# Patient Record
Sex: Female | Born: 1950
Health system: Southern US, Community
[De-identification: ages and names within clinical notes are randomized; demographics above are authoritative.]

## PROBLEM LIST (undated history)

## (undated) DIAGNOSIS — F32A Depression, unspecified: Secondary | ICD-10-CM

## (undated) DIAGNOSIS — Z780 Asymptomatic menopausal state: Secondary | ICD-10-CM

## (undated) DIAGNOSIS — C50919 Malignant neoplasm of unspecified site of unspecified female breast: Secondary | ICD-10-CM

## (undated) DIAGNOSIS — T7840XA Allergy, unspecified, initial encounter: Secondary | ICD-10-CM

## (undated) DIAGNOSIS — R0600 Dyspnea, unspecified: Secondary | ICD-10-CM

## (undated) DIAGNOSIS — I10 Essential (primary) hypertension: Secondary | ICD-10-CM

## (undated) DIAGNOSIS — E079 Disorder of thyroid, unspecified: Secondary | ICD-10-CM

## (undated) DIAGNOSIS — F419 Anxiety disorder, unspecified: Secondary | ICD-10-CM

## (undated) DIAGNOSIS — E039 Hypothyroidism, unspecified: Secondary | ICD-10-CM

## (undated) DIAGNOSIS — F329 Major depressive disorder, single episode, unspecified: Secondary | ICD-10-CM

## (undated) HISTORY — DX: Asymptomatic menopausal state: Z78.0

## (undated) HISTORY — DX: Depression, unspecified: F32.A

## (undated) HISTORY — DX: Anxiety disorder, unspecified: F41.9

## (undated) HISTORY — PX: ADENOIDECTOMY: SUR15

## (undated) HISTORY — DX: Major depressive disorder, single episode, unspecified: F32.9

## (undated) HISTORY — PX: TONSILLECTOMY: SUR1361

## (undated) HISTORY — DX: Allergy, unspecified, initial encounter: T78.40XA

## (undated) HISTORY — DX: Malignant neoplasm of unspecified site of unspecified female breast: C50.919

## (undated) HISTORY — PX: APPENDECTOMY: SHX54

---

## 2001-09-07 ENCOUNTER — Other Ambulatory Visit: Admission: RE | Admit: 2001-09-07 | Discharge: 2001-09-07 | Payer: Self-pay | Admitting: Obstetrics and Gynecology

## 2006-05-07 ENCOUNTER — Ambulatory Visit: Payer: Self-pay | Admitting: Family Medicine

## 2007-06-25 DIAGNOSIS — C50919 Malignant neoplasm of unspecified site of unspecified female breast: Secondary | ICD-10-CM

## 2007-06-25 HISTORY — DX: Malignant neoplasm of unspecified site of unspecified female breast: C50.919

## 2007-06-25 HISTORY — PX: MASTECTOMY: SHX3

## 2007-07-22 ENCOUNTER — Encounter: Payer: Self-pay | Admitting: Obstetrics & Gynecology

## 2007-07-22 ENCOUNTER — Ambulatory Visit: Payer: Self-pay | Admitting: Obstetrics & Gynecology

## 2007-07-24 ENCOUNTER — Encounter (INDEPENDENT_AMBULATORY_CARE_PROVIDER_SITE_OTHER): Payer: Self-pay | Admitting: Diagnostic Radiology

## 2007-07-24 ENCOUNTER — Encounter: Admission: RE | Admit: 2007-07-24 | Discharge: 2007-07-24 | Payer: Self-pay | Admitting: Obstetrics & Gynecology

## 2007-08-03 ENCOUNTER — Encounter: Admission: RE | Admit: 2007-08-03 | Discharge: 2007-08-03 | Payer: Self-pay | Admitting: Obstetrics & Gynecology

## 2007-08-25 ENCOUNTER — Encounter: Admission: RE | Admit: 2007-08-25 | Discharge: 2007-08-25 | Payer: Self-pay | Admitting: Surgery

## 2007-08-27 ENCOUNTER — Encounter (INDEPENDENT_AMBULATORY_CARE_PROVIDER_SITE_OTHER): Payer: Self-pay | Admitting: Surgery

## 2007-08-27 ENCOUNTER — Ambulatory Visit (HOSPITAL_BASED_OUTPATIENT_CLINIC_OR_DEPARTMENT_OTHER): Admission: RE | Admit: 2007-08-27 | Discharge: 2007-08-28 | Payer: Self-pay | Admitting: Surgery

## 2007-09-10 ENCOUNTER — Ambulatory Visit: Payer: Self-pay | Admitting: Oncology

## 2007-10-09 LAB — COMPREHENSIVE METABOLIC PANEL
Albumin: 4 g/dL (ref 3.5–5.2)
CO2: 23 mEq/L (ref 19–32)
Calcium: 9.2 mg/dL (ref 8.4–10.5)
Chloride: 106 mEq/L (ref 96–112)
Glucose, Bld: 119 mg/dL — ABNORMAL HIGH (ref 70–99)
Sodium: 142 mEq/L (ref 135–145)
Total Bilirubin: 0.4 mg/dL (ref 0.3–1.2)
Total Protein: 6.6 g/dL (ref 6.0–8.3)

## 2007-10-09 LAB — CBC WITH DIFFERENTIAL/PLATELET
Eosinophils Absolute: 0.2 10*3/uL (ref 0.0–0.5)
HCT: 40.1 % (ref 34.8–46.6)
LYMPH%: 36.3 % (ref 14.0–48.0)
MONO#: 0.6 10*3/uL (ref 0.1–0.9)
NEUT#: 5.5 10*3/uL (ref 1.5–6.5)
Platelets: 231 10*3/uL (ref 145–400)
RBC: 4.64 10*6/uL (ref 3.70–5.32)
WBC: 9.9 10*3/uL (ref 3.9–10.0)

## 2007-10-09 LAB — LACTATE DEHYDROGENASE: LDH: 161 U/L (ref 94–250)

## 2007-10-09 LAB — CANCER ANTIGEN 27.29: CA 27.29: 32 U/mL (ref 0–39)

## 2007-10-12 ENCOUNTER — Encounter: Admission: RE | Admit: 2007-10-12 | Discharge: 2007-10-12 | Payer: Self-pay | Admitting: Oncology

## 2007-10-20 ENCOUNTER — Ambulatory Visit (HOSPITAL_COMMUNITY): Admission: RE | Admit: 2007-10-20 | Discharge: 2007-10-20 | Payer: Self-pay | Admitting: Oncology

## 2007-11-27 ENCOUNTER — Ambulatory Visit (HOSPITAL_BASED_OUTPATIENT_CLINIC_OR_DEPARTMENT_OTHER): Admission: RE | Admit: 2007-11-27 | Discharge: 2007-11-27 | Payer: Self-pay | Admitting: Surgery

## 2007-11-27 ENCOUNTER — Encounter (INDEPENDENT_AMBULATORY_CARE_PROVIDER_SITE_OTHER): Payer: Self-pay | Admitting: Surgery

## 2007-12-07 ENCOUNTER — Ambulatory Visit: Payer: Self-pay | Admitting: Oncology

## 2007-12-09 LAB — CBC WITH DIFFERENTIAL/PLATELET
BASO%: 0.8 % (ref 0.0–2.0)
EOS%: 2.6 % (ref 0.0–7.0)
HCT: 40.1 % (ref 34.8–46.6)
LYMPH%: 38 % (ref 14.0–48.0)
MCH: 30.5 pg (ref 26.0–34.0)
MCHC: 35 g/dL (ref 32.0–36.0)
MONO#: 0.7 10*3/uL (ref 0.1–0.9)
MONO%: 7.5 % (ref 0.0–13.0)
NEUT%: 51.1 % (ref 39.6–76.8)
Platelets: 251 10*3/uL (ref 145–400)
RBC: 4.6 10*6/uL (ref 3.70–5.32)
WBC: 9.5 10*3/uL (ref 3.9–10.0)

## 2007-12-10 LAB — COMPREHENSIVE METABOLIC PANEL
ALT: 23 U/L (ref 0–35)
AST: 22 U/L (ref 0–37)
Alkaline Phosphatase: 129 U/L — ABNORMAL HIGH (ref 39–117)
CO2: 24 mEq/L (ref 19–32)
Creatinine, Ser: 0.64 mg/dL (ref 0.40–1.20)
Sodium: 140 mEq/L (ref 135–145)
Total Bilirubin: 0.5 mg/dL (ref 0.3–1.2)
Total Protein: 6.8 g/dL (ref 6.0–8.3)

## 2008-03-08 ENCOUNTER — Ambulatory Visit: Payer: Self-pay | Admitting: Oncology

## 2008-03-10 LAB — CBC WITH DIFFERENTIAL/PLATELET
BASO%: 0.5 % (ref 0.0–2.0)
Eosinophils Absolute: 0.2 10*3/uL (ref 0.0–0.5)
MCHC: 34.8 g/dL (ref 32.0–36.0)
MONO#: 0.5 10*3/uL (ref 0.1–0.9)
MONO%: 5.4 % (ref 0.0–13.0)
NEUT#: 4.7 10*3/uL (ref 1.5–6.5)
RBC: 4.53 10*6/uL (ref 3.70–5.32)
RDW: 12.6 % (ref 11.3–14.5)
WBC: 9.4 10*3/uL (ref 3.9–10.0)

## 2008-03-11 LAB — COMPREHENSIVE METABOLIC PANEL
ALT: 21 U/L (ref 0–35)
Albumin: 4 g/dL (ref 3.5–5.2)
Alkaline Phosphatase: 85 U/L (ref 39–117)
CO2: 25 mEq/L (ref 19–32)
Glucose, Bld: 103 mg/dL — ABNORMAL HIGH (ref 70–99)
Potassium: 3.5 mEq/L (ref 3.5–5.3)
Sodium: 142 mEq/L (ref 135–145)
Total Protein: 6.3 g/dL (ref 6.0–8.3)

## 2008-06-13 IMAGING — CR DG CHEST 2V
2 series · 2 of 2 positions shown · non-contrast
Comparison: None.

CLINICAL DATA: Preop respiratory exam for right breast cancer.
 CHEST - 2 VIEWS:

[view not recorded (1 of 2)]
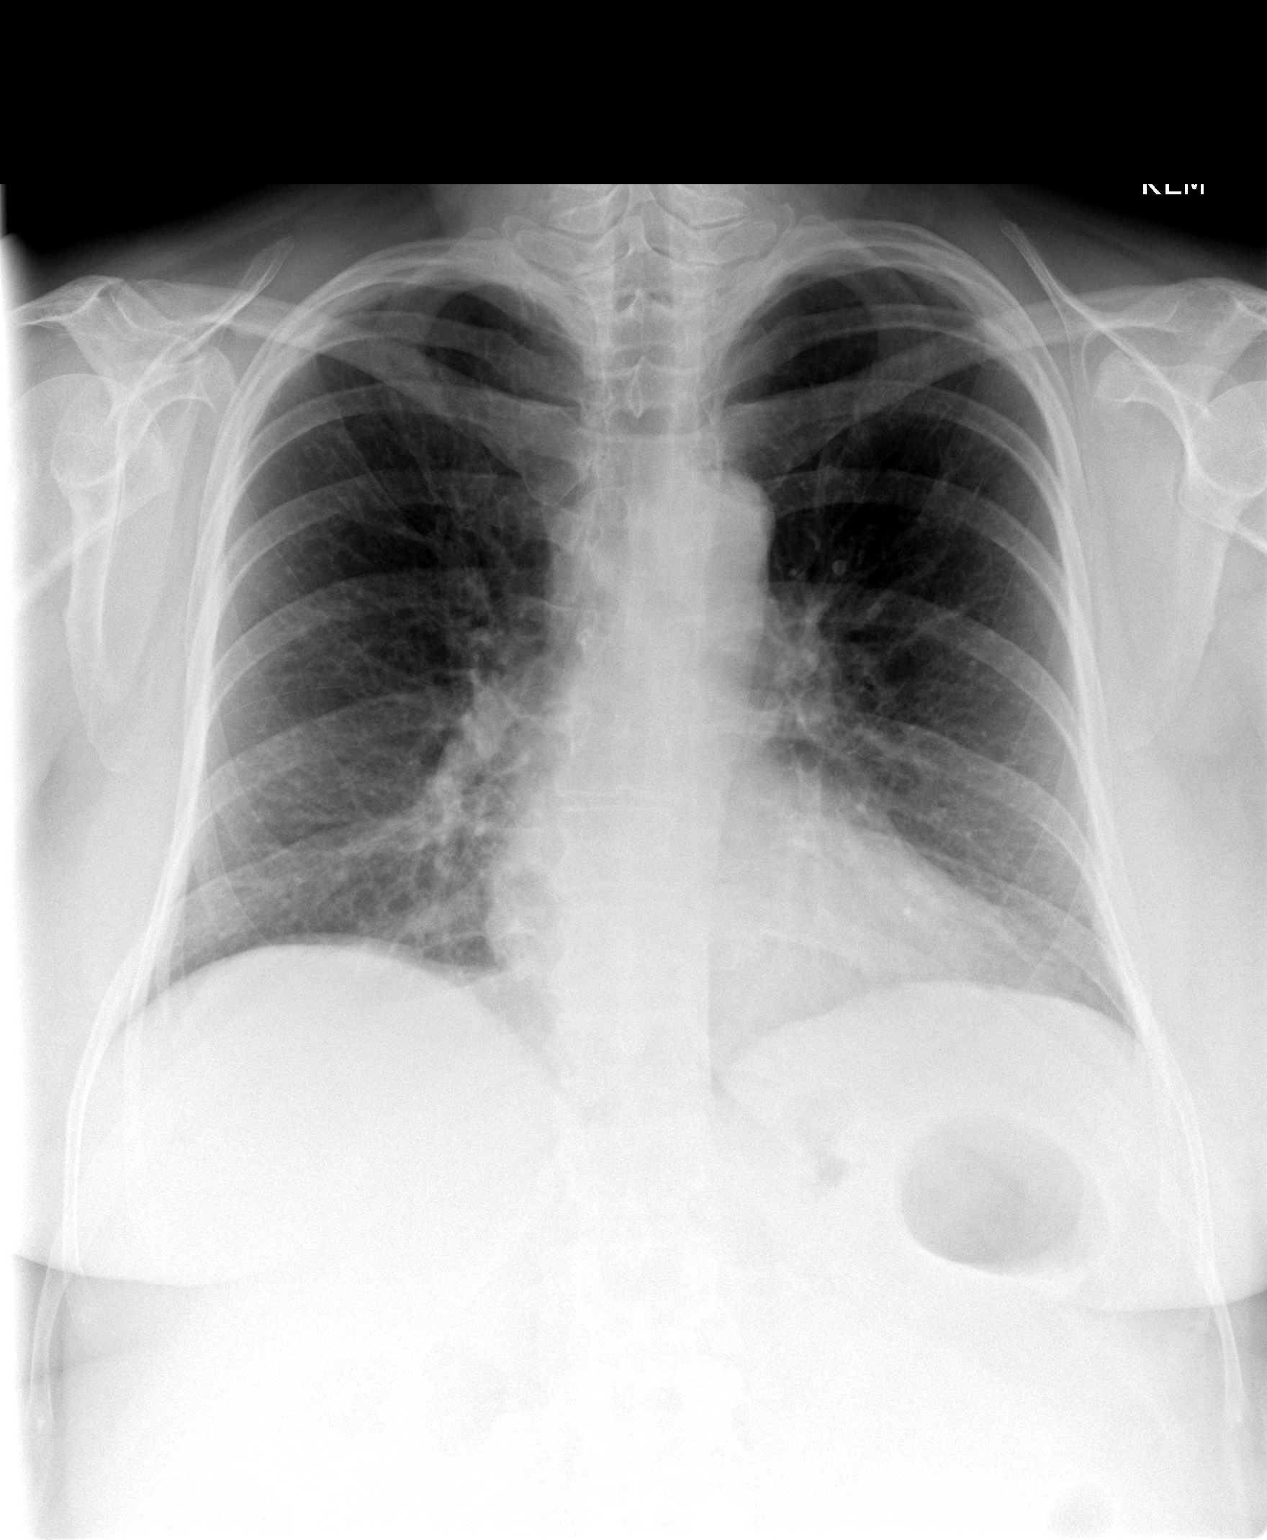

[view not recorded (2 of 2)]
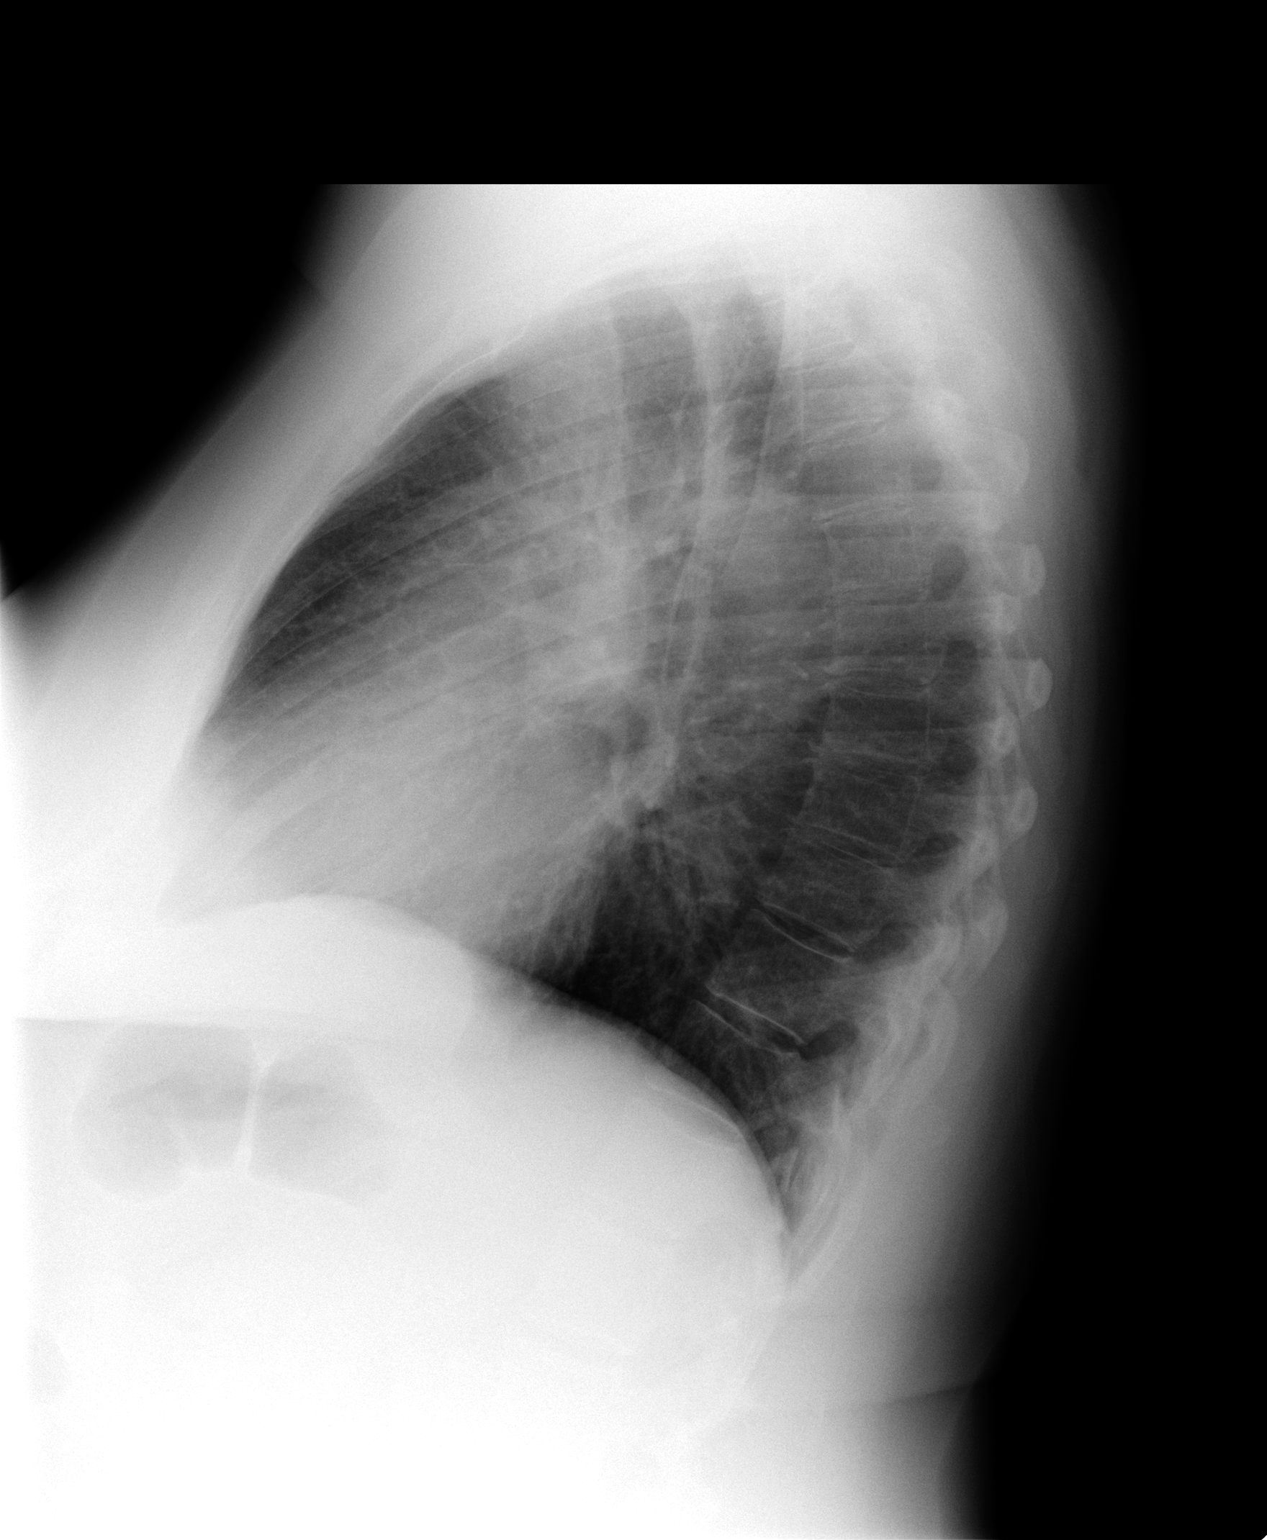

[2 of 2 positions shown; findings below may reference images not displayed]

FINDINGS: Heart size normal.  There is peribronchial thickening without pneumonia or pleural fluid.  No metastases noted to the lung or bone.
IMPRESSION: Bronchitic changes ? no active disease.

## 2008-06-23 ENCOUNTER — Ambulatory Visit: Payer: Self-pay | Admitting: Oncology

## 2008-06-24 HISTORY — PX: BREAST RECONSTRUCTION: SHX9

## 2008-06-28 LAB — CBC WITH DIFFERENTIAL/PLATELET
BASO%: 0.4 % (ref 0.0–2.0)
EOS%: 1.5 % (ref 0.0–7.0)
Eosinophils Absolute: 0.1 10*3/uL (ref 0.0–0.5)
LYMPH%: 42.1 % (ref 14.0–48.0)
MCH: 30.4 pg (ref 26.0–34.0)
MCHC: 34.3 g/dL (ref 32.0–36.0)
MCV: 88.8 fL (ref 81.0–101.0)
MONO%: 5.5 % (ref 0.0–13.0)
Platelets: 202 10*3/uL (ref 145–400)
RBC: 4.61 10*6/uL (ref 3.70–5.32)

## 2008-06-29 LAB — COMPREHENSIVE METABOLIC PANEL
Alkaline Phosphatase: 83 U/L (ref 39–117)
Glucose, Bld: 113 mg/dL — ABNORMAL HIGH (ref 70–99)
Sodium: 143 mEq/L (ref 135–145)
Total Bilirubin: 0.5 mg/dL (ref 0.3–1.2)
Total Protein: 6.5 g/dL (ref 6.0–8.3)

## 2008-10-21 ENCOUNTER — Ambulatory Visit: Payer: Self-pay | Admitting: Oncology

## 2008-12-21 ENCOUNTER — Encounter: Admission: RE | Admit: 2008-12-21 | Discharge: 2008-12-21 | Payer: Self-pay | Admitting: Surgery

## 2009-02-20 ENCOUNTER — Ambulatory Visit: Payer: Self-pay | Admitting: Oncology

## 2009-02-23 LAB — CBC WITH DIFFERENTIAL/PLATELET
BASO%: 0.3 % (ref 0.0–2.0)
Eosinophils Absolute: 0.2 10*3/uL (ref 0.0–0.5)
HCT: 40.2 % (ref 34.8–46.6)
MCHC: 34.9 g/dL (ref 31.5–36.0)
MONO#: 0.4 10*3/uL (ref 0.1–0.9)
NEUT#: 4.6 10*3/uL (ref 1.5–6.5)
NEUT%: 50.8 % (ref 38.4–76.8)
Platelets: 181 10*3/uL (ref 145–400)
WBC: 9.1 10*3/uL (ref 3.9–10.3)
lymph#: 3.8 10*3/uL — ABNORMAL HIGH (ref 0.9–3.3)

## 2009-02-23 LAB — COMPREHENSIVE METABOLIC PANEL
ALT: 27 U/L (ref 0–35)
AST: 34 U/L (ref 0–37)
Albumin: 3.6 g/dL (ref 3.5–5.2)
CO2: 26 mEq/L (ref 19–32)
Calcium: 8.7 mg/dL (ref 8.4–10.5)
Chloride: 109 mEq/L (ref 96–112)
Potassium: 3.2 mEq/L — ABNORMAL LOW (ref 3.5–5.3)
Total Protein: 6.5 g/dL (ref 6.0–8.3)

## 2009-03-29 ENCOUNTER — Ambulatory Visit: Payer: Self-pay | Admitting: Family Medicine

## 2009-04-03 ENCOUNTER — Ambulatory Visit: Payer: Self-pay | Admitting: Oncology

## 2009-04-05 LAB — COMPREHENSIVE METABOLIC PANEL
ALT: 22 U/L (ref 0–35)
BUN: 9 mg/dL (ref 6–23)
CO2: 22 mEq/L (ref 19–32)
Calcium: 8.8 mg/dL (ref 8.4–10.5)
Chloride: 108 mEq/L (ref 96–112)
Creatinine, Ser: 0.84 mg/dL (ref 0.40–1.20)
Glucose, Bld: 126 mg/dL — ABNORMAL HIGH (ref 70–99)
Total Bilirubin: 0.4 mg/dL (ref 0.3–1.2)

## 2009-04-27 ENCOUNTER — Emergency Department (HOSPITAL_BASED_OUTPATIENT_CLINIC_OR_DEPARTMENT_OTHER): Admission: EM | Admit: 2009-04-27 | Discharge: 2009-04-27 | Payer: Self-pay | Admitting: Emergency Medicine

## 2009-04-27 ENCOUNTER — Ambulatory Visit: Payer: Self-pay | Admitting: Diagnostic Radiology

## 2009-05-08 ENCOUNTER — Ambulatory Visit: Payer: Self-pay | Admitting: Oncology

## 2009-05-10 ENCOUNTER — Ambulatory Visit (HOSPITAL_COMMUNITY): Admission: RE | Admit: 2009-05-10 | Discharge: 2009-05-10 | Payer: Self-pay | Admitting: Oncology

## 2009-05-10 LAB — CBC WITH DIFFERENTIAL/PLATELET
Basophils Absolute: 0.1 10*3/uL (ref 0.0–0.1)
EOS%: 1.9 % (ref 0.0–7.0)
Eosinophils Absolute: 0.2 10*3/uL (ref 0.0–0.5)
HCT: 41.2 % (ref 34.8–46.6)
HGB: 14.1 g/dL (ref 11.6–15.9)
MCH: 31.1 pg (ref 25.1–34.0)
MONO#: 0.7 10*3/uL (ref 0.1–0.9)
NEUT#: 6.3 10*3/uL (ref 1.5–6.5)
NEUT%: 56 % (ref 38.4–76.8)
RDW: 13 % (ref 11.2–14.5)
WBC: 11.3 10*3/uL — ABNORMAL HIGH (ref 3.9–10.3)
lymph#: 4 10*3/uL — ABNORMAL HIGH (ref 0.9–3.3)

## 2009-05-10 LAB — COMPREHENSIVE METABOLIC PANEL
AST: 26 U/L (ref 0–37)
Albumin: 3.6 g/dL (ref 3.5–5.2)
BUN: 9 mg/dL (ref 6–23)
CO2: 27 mEq/L (ref 19–32)
Calcium: 8.8 mg/dL (ref 8.4–10.5)
Chloride: 103 mEq/L (ref 96–112)
Creatinine, Ser: 0.68 mg/dL (ref 0.40–1.20)
Potassium: 4.1 mEq/L (ref 3.5–5.3)

## 2009-05-10 LAB — VITAMIN D 25 HYDROXY (VIT D DEFICIENCY, FRACTURES): Vit D, 25-Hydroxy: 23 ng/mL — ABNORMAL LOW (ref 30–89)

## 2009-05-24 LAB — URINALYSIS, MICROSCOPIC - CHCC
Bilirubin (Urine): NEGATIVE
Ketones: NEGATIVE mg/dL
Protein: NEGATIVE mg/dL
Specific Gravity, Urine: 1.01 (ref 1.003–1.035)
pH: 7 (ref 4.6–8.0)

## 2009-05-25 LAB — URINE CULTURE

## 2009-12-27 ENCOUNTER — Encounter: Admission: RE | Admit: 2009-12-27 | Discharge: 2009-12-27 | Payer: Self-pay | Admitting: Oncology

## 2010-01-08 ENCOUNTER — Ambulatory Visit: Payer: Self-pay | Admitting: Oncology

## 2010-01-10 LAB — COMPREHENSIVE METABOLIC PANEL
AST: 20 U/L (ref 0–37)
Albumin: 4.1 g/dL (ref 3.5–5.2)
Alkaline Phosphatase: 67 U/L (ref 39–117)
Glucose, Bld: 103 mg/dL — ABNORMAL HIGH (ref 70–99)
Potassium: 3.7 mEq/L (ref 3.5–5.3)
Sodium: 142 mEq/L (ref 135–145)
Total Bilirubin: 0.5 mg/dL (ref 0.3–1.2)
Total Protein: 6.6 g/dL (ref 6.0–8.3)

## 2010-01-10 LAB — CBC WITH DIFFERENTIAL/PLATELET
BASO%: 0.4 % (ref 0.0–2.0)
EOS%: 2.3 % (ref 0.0–7.0)
Eosinophils Absolute: 0.2 10*3/uL (ref 0.0–0.5)
LYMPH%: 42.9 % (ref 14.0–49.7)
MCH: 31.2 pg (ref 25.1–34.0)
MCHC: 35 g/dL (ref 31.5–36.0)
MCV: 89.1 fL (ref 79.5–101.0)
MONO%: 5.9 % (ref 0.0–14.0)
NEUT#: 4.6 10*3/uL (ref 1.5–6.5)
Platelets: 180 10*3/uL (ref 145–400)
RBC: 4.39 10*6/uL (ref 3.70–5.45)
RDW: 13.3 % (ref 11.2–14.5)

## 2010-01-10 LAB — TSH: TSH: 47.459 u[IU]/mL — ABNORMAL HIGH (ref 0.350–4.500)

## 2010-01-17 ENCOUNTER — Encounter: Admission: RE | Admit: 2010-01-17 | Discharge: 2010-01-17 | Payer: Self-pay | Admitting: Oncology

## 2010-07-06 ENCOUNTER — Ambulatory Visit: Payer: Self-pay | Admitting: Oncology

## 2010-07-10 LAB — CBC WITH DIFFERENTIAL/PLATELET
BASO%: 0.4 % (ref 0.0–2.0)
Basophils Absolute: 0 10*3/uL (ref 0.0–0.1)
EOS%: 2.1 % (ref 0.0–7.0)
Eosinophils Absolute: 0.2 10*3/uL (ref 0.0–0.5)
HCT: 39.1 % (ref 34.8–46.6)
HGB: 13.6 g/dL (ref 11.6–15.9)
LYMPH%: 41.8 % (ref 14.0–49.7)
MCH: 31.8 pg (ref 25.1–34.0)
MCHC: 34.6 g/dL (ref 31.5–36.0)
MCV: 91.7 fL (ref 79.5–101.0)
MONO#: 0.6 10*3/uL (ref 0.1–0.9)
MONO%: 5.6 % (ref 0.0–14.0)
NEUT#: 5.4 10*3/uL (ref 1.5–6.5)
NEUT%: 50.1 % (ref 38.4–76.8)
Platelets: 179 10*3/uL (ref 145–400)
RBC: 4.26 10*6/uL (ref 3.70–5.45)
RDW: 14 % (ref 11.2–14.5)
WBC: 10.7 10*3/uL — ABNORMAL HIGH (ref 3.9–10.3)
lymph#: 4.5 10*3/uL — ABNORMAL HIGH (ref 0.9–3.3)

## 2010-07-11 LAB — COMPREHENSIVE METABOLIC PANEL
ALT: 21 U/L (ref 0–35)
AST: 27 U/L (ref 0–37)
Albumin: 4.3 g/dL (ref 3.5–5.2)
Alkaline Phosphatase: 76 U/L (ref 39–117)
BUN: 8 mg/dL (ref 6–23)
CO2: 26 mEq/L (ref 19–32)
Calcium: 9 mg/dL (ref 8.4–10.5)
Chloride: 107 mEq/L (ref 96–112)
Creatinine, Ser: 0.7 mg/dL (ref 0.40–1.20)
Glucose, Bld: 96 mg/dL (ref 70–99)
Potassium: 3.8 mEq/L (ref 3.5–5.3)
Sodium: 142 mEq/L (ref 135–145)
Total Bilirubin: 0.5 mg/dL (ref 0.3–1.2)
Total Protein: 6.6 g/dL (ref 6.0–8.3)

## 2010-07-11 LAB — CANCER ANTIGEN 27.29: CA 27.29: 28 U/mL (ref 0–39)

## 2010-07-11 LAB — VITAMIN D 25 HYDROXY (VIT D DEFICIENCY, FRACTURES): Vit D, 25-Hydroxy: 33 ng/mL (ref 30–89)

## 2010-11-06 NOTE — Assessment & Plan Note (Signed)
NAME:  Angelica [name], Angelica [name] NO.:  [id number]   MEDICAL RECORD NO.:  [id number]          PATIENT TYPE:  POB   LOCATION:  [facility] at [place]         FACILITY:  [facility]   PHYSICIAN:  Elsie Lincoln, MD      DATE OF BIRTH:  15-Dec-1950   DATE OF SERVICE:                                  CLINIC NOTE   The patient is a 60 year old G-1, P-1 female, menopausal who presents  for her annual exam.  Her only complaint is of a breast lump that has  recurred and has made her nipple retract.  She had a history of a right  breast lump removal by Dr. Providence Lanius in [facility] that was benign, what  sounds like a fibroadenoma.  The patient is not sexually active due to  problems with her husband's anatomy.  She is vague about this and  I did  not press as it seems like she does not like to talk about it.  She has  not had any postmenopausal bleeding and does not complain of any bowel  or bladder problems.   PAST MEDICAL HISTORY:  1. Asthma.  2. Pneumonia two years ago.  3. Hypothyroid.   SURGICAL HISTORY:  The right breast lump removal as I described above.   GYNECOLOGY HISTORY:  No history of abnormal pap smears, sexually  transmitted diseases, ovarian cysts or fibroid tumors.   OBSTETRICAL HISTORY:  NSVD x1 thirty-four yeas ago.   IMMUNIZATIONS:  The patient needs a flu shot today.   SOCIAL HISTORY:  She smokes a half pack a day for 10 years.  Does not  drink alcohol.  Does drink four caffeinated beverages a year.   REVIEW OF SYSTEMS:  Positive for weight gain.   FAMILY HISTORY:  Negative for ovarian, breast, __________ and __________  cancer.  Mom and dad both have high blood pressure.  Mom had a stroke a  year ago.   PHYSICAL EXAMINATION:  GENERAL:  Well-nourished, well-developed in no  apparent distress.  Temperature:  98.1.  Pulse:  88.  Blood pressure:  170/94.  Weight:  183.  Height:  63-1/2 inches.  HEENT:  Normocephalic.  Atraumatic.  Thyroid:  No masses.  LUNGS:  Clear  to auscultation bilaterally.  HEART:  Regular rate and rhythm.  BREASTS:  Right breast has an oblong 4 x 2 cm hard mass approximately at  10 o'clock near the right nipple.  The right nipple is retracted and  this is new for her.  There is no nipple discharge.  Left breast is  unremarkable.  There is no lymphadenopathy.  ABDOMEN:  Obese.  Difficult to palpate any abnormality.  No organomegaly  or hernia.  GENITALIA:  Tanner 5.Vagina atrophic. Cervix closed, nontender. Uterus  mobile, nontender. Adnexa no masses, nontender.  RECTAL/VAG:  Nontender.  Positive blood on Hemoccult.  Patient states  she may have hemorrhoids.  EXTREMITIES:  Nontender.   ASSESSMENT/PLAN:  60 year old female with:  1. Pap smear.  2. Diagnostic right mammogram.  3. Colonoscopy.  4. Return to primary care doctor to look at hypertension.  5. Flu vaccine.  ______________________________  Elsie Lincoln, MD     KL/MEDQ  D:  07/22/2007  T:  07/22/2007  Job:  [id number]

## 2010-11-06 NOTE — Op Note (Signed)
Angelica [name], Angelica [name]                ACCOUNT NO.:  [id number]   MEDICAL RECORD NO.:  [id number]          PATIENT TYPE:  AMB   LOCATION:  [facility]                          FACILITY:  [facility]   PHYSICIAN:  Thomas A. Cornett, M.D.DATE OF BIRTH:  May 01, 1951   DATE OF PROCEDURE:  09/27/2007  DATE OF DISCHARGE:                               OPERATIVE REPORT   PREOPERATIVE DIAGNOSIS:  Right breast cancer.   POSTOPERATIVE DIAGNOSIS:  Right breast cancer.   PROCEDURE:  Right simple mastectomy with right sentinel lymph node  mapping with injection of methylene blue dye.   SURGEON:  Harriette Bouillon, MD.   ASSISTANT:  Leonie Man, MD.   ANESTHESIA:  LMA.   ESTIMATED BLOOD LOSS:  60 mL.   SPECIMEN:  1. Right breast.  2. Sentinel lymph nodes from right axilla, negative by Touch Prep.   DRAINS:  Two 19 Blake drains.   INDICATIONS FOR PROCEDURE:  The patient is a 60 year old female found to  have roughly a 2.5 cm cancer just inferior to her nipple areolar  complex.  She did not wish to undergo any breast conserving measures in  this setting after discussion of that and wished to undergo mastectomy.  She presents today for right simple mastectomy and right axillary  sentinel lymph node mapping.  Risks were discussed with the patient.  She voiced understanding and agreed to proceed.   DESCRIPTION OF PROCEDURE:  The patient was brought to the operating room  after under undergoing injection of her right periareolar region in the  holding area with technetium sulfur colloid.  After induction of LMA  anesthesia, I injected 4 mL methylene blue dye in a subareolar position  and massaged for 5 minutes.  After sterile prep and drape, a fishmouth  incision was used above and below the nipple.  Superior and inferior  skin flaps were created using the cautery to the clavicle and into the  inferior mammary crease.  We then at this point used a NeoProbe to  identify a blue hot sentinel node.  There were  actually 3 together.  We  took all 3 of these.  These were negative by Touch Prep according to the  pathologist.  I did not feel any other abnormal nodes or detect any  significant activity at this point.  We next removed the breast in a  medial to lateral fashion, taking all it all way out to the tail of  Spence and removing this as well.  The specimen was oriented and sent to  pathology.  Examination of the axilla revealed this area to be  hemostatic.  Through 2 separate stab wounds, 19 Blake drains were placed  and secured to the skin with 3-0 nylon.  Irrigation was used and  suctioned out.  Hemostasis was excellent.  The wound  was closed in layers using a deep layer of 3-0 Vicryl to secure the  flaps and a layer of 3-0 Monocryl for a subcuticular stitch.  Dermabond  was applied.  All final counts, sponge, needle and instruments were  found be correct at this portion of the  case.  The patient was taken to  recovery in satisfactory condition.      Thomas A. Cornett, M.D.  Electronically Signed     TAC/MEDQ  D:  08/27/2007  T:  08/27/2007  Job:  [id number]   cc:   Lesly Dukes, M.D.

## 2010-11-06 NOTE — Op Note (Signed)
Angelica [name], Angelica [name]                ACCOUNT NO.:  [id number]   MEDICAL RECORD NO.:  [id number]          PATIENT TYPE:  AMB   LOCATION:  [facility]                          FACILITY:  [facility]   PHYSICIAN:  Thomas A. Cornett, M.D.DATE OF BIRTH:  [birth year]   DATE OF PROCEDURE:  11/27/2007  DATE OF DISCHARGE:                               OPERATIVE REPORT   PREOPERATIVE DIAGNOSIS:  Right breast cancer with right axillary  micrometastasis.   POSTOPERATIVE DIAGNOSIS:  Right breast cancer with right axillary  micrometastasis.   PROCEDURE:  Completion of right axillary lymph node dissection.   SURGEON:  Maisie Fus A. Cornett, MD   ANESTHESIA:  LMA with 0.25% Sensorcaine local.   ESTIMATED BLOOD LOSS:  50 mL.   SPECIMEN:  Right axillary contents to pathology.   DRAIN:  #19 Blake drain to right axilla.   INDICATIONS FOR PROCEDURE:  The patient is a pleasant 60 year old female  with right breast cancer.  She had T1 and micrometastatic deposit in  zero disease.  Given the micrometastatic disease, we talked about  options of treating her axilla, which include radiation versus axillary  node dissection.  She did not wish to undergo radiation therapy and her  risk of recurrence in the axilla is anywhere from 10-25% with a  micrometastasis.  I talked with her about this and she wished to undergo  axillary node dissection opposed to radiation therapy for treatment of  this.  The risks of the procedure were discussed.  She agreed to  proceed.   DESCRIPTION OF PROCEDURE:  The patient was brought to the operating room  and placed in supine.  After induction of LMA anesthesia, the right  chest and right axilla were prepped and draped in the sterile fashion.  I used the lateral half of her previous mastectomy incision and opened  into the axilla.  We then dissected all axillary contents away from the  axillary vein superiorly, the thoracodorsal trunk posteriorly, and the  long thoracic nerve medially.  All  axillary contents were removed  preserving the structures.  These were passed off the field.  Hemostasis  was achieved.  I placed Surgicel in the cavity.  Hemostasis at this  point was excellent.  Through a separate stab wound, a 19 Blake drain  was placed and secured the skin with a 2-0 nylon.  Wound was then closed  in  layers using a deep layer of 3-0 Vicryl and a 4-0 Monocryl subcuticular  stitch.  Dermabond was applied.  All final counts of sponge, needle, and  instruments were found to be correct at this portion of case.  The  patient was then awoke and taken to recovery in satisfactory condition.      Thomas A. Cornett, M.D.  Electronically Signed     TAC/MEDQ  D:  11/27/2007  T:  11/27/2007  Job:  [id number]   cc:   Valentino Hue. Magrinat, M.D.

## 2011-01-16 ENCOUNTER — Encounter (HOSPITAL_BASED_OUTPATIENT_CLINIC_OR_DEPARTMENT_OTHER): Payer: BC Managed Care – PPO | Admitting: Oncology

## 2011-01-16 ENCOUNTER — Other Ambulatory Visit: Payer: Self-pay | Admitting: Oncology

## 2011-01-16 DIAGNOSIS — Z170 Estrogen receptor positive status [ER+]: Secondary | ICD-10-CM

## 2011-01-16 DIAGNOSIS — Z9011 Acquired absence of right breast and nipple: Secondary | ICD-10-CM

## 2011-01-16 DIAGNOSIS — C50119 Malignant neoplasm of central portion of unspecified female breast: Secondary | ICD-10-CM

## 2011-01-16 LAB — CANCER ANTIGEN 27.29: CA 27.29: 30 U/mL (ref 0–39)

## 2011-01-16 LAB — CBC WITH DIFFERENTIAL/PLATELET
Basophils Absolute: 0.1 10*3/uL (ref 0.0–0.1)
HCT: 40.1 % (ref 34.8–46.6)
HGB: 13.7 g/dL (ref 11.6–15.9)
LYMPH%: 48 % (ref 14.0–49.7)
MCH: 29.4 pg (ref 25.1–34.0)
MONO#: 0.4 10*3/uL (ref 0.1–0.9)
NEUT%: 43.9 % (ref 38.4–76.8)
Platelets: 187 10*3/uL (ref 145–400)
WBC: 8.4 10*3/uL (ref 3.9–10.3)
lymph#: 4 10*3/uL — ABNORMAL HIGH (ref 0.9–3.3)

## 2011-01-16 LAB — COMPREHENSIVE METABOLIC PANEL
BUN: 9 mg/dL (ref 6–23)
CO2: 24 mEq/L (ref 19–32)
Calcium: 8.7 mg/dL (ref 8.4–10.5)
Chloride: 107 mEq/L (ref 96–112)
Creatinine, Ser: 0.71 mg/dL (ref 0.50–1.10)
Total Bilirubin: 0.5 mg/dL (ref 0.3–1.2)

## 2011-01-16 LAB — TSH: TSH: 10.166 u[IU]/mL — ABNORMAL HIGH (ref 0.350–4.500)

## 2011-01-22 ENCOUNTER — Ambulatory Visit: Payer: BC Managed Care – PPO

## 2011-01-22 ENCOUNTER — Ambulatory Visit
Admission: RE | Admit: 2011-01-22 | Discharge: 2011-01-22 | Disposition: A | Discharge status: Home / Self Care | Payer: BC Managed Care – PPO | Source: Ambulatory Visit | Attending: Oncology | Admitting: Oncology

## 2011-01-22 DIAGNOSIS — Z9011 Acquired absence of right breast and nipple: Secondary | ICD-10-CM

## 2011-01-31 ENCOUNTER — Other Ambulatory Visit: Payer: Self-pay | Admitting: Family Medicine

## 2011-01-31 ENCOUNTER — Encounter: Payer: Self-pay | Admitting: Family Medicine

## 2011-01-31 ENCOUNTER — Ambulatory Visit (INDEPENDENT_AMBULATORY_CARE_PROVIDER_SITE_OTHER): Payer: BC Managed Care – PPO | Admitting: Family Medicine

## 2011-01-31 DIAGNOSIS — J309 Allergic rhinitis, unspecified: Secondary | ICD-10-CM

## 2011-01-31 DIAGNOSIS — Z2911 Encounter for prophylactic immunotherapy for respiratory syncytial virus (RSV): Secondary | ICD-10-CM

## 2011-01-31 DIAGNOSIS — J45909 Unspecified asthma, uncomplicated: Secondary | ICD-10-CM

## 2011-01-31 DIAGNOSIS — J302 Other seasonal allergic rhinitis: Secondary | ICD-10-CM | POA: Insufficient documentation

## 2011-01-31 DIAGNOSIS — F411 Generalized anxiety disorder: Secondary | ICD-10-CM | POA: Insufficient documentation

## 2011-01-31 DIAGNOSIS — R7309 Other abnormal glucose: Secondary | ICD-10-CM

## 2011-01-31 DIAGNOSIS — E039 Hypothyroidism, unspecified: Secondary | ICD-10-CM | POA: Insufficient documentation

## 2011-01-31 DIAGNOSIS — Z23 Encounter for immunization: Secondary | ICD-10-CM

## 2011-01-31 DIAGNOSIS — C50919 Malignant neoplasm of unspecified site of unspecified female breast: Secondary | ICD-10-CM | POA: Insufficient documentation

## 2011-01-31 DIAGNOSIS — Z13220 Encounter for screening for lipoid disorders: Secondary | ICD-10-CM

## 2011-01-31 LAB — POCT GLYCOSYLATED HEMOGLOBIN (HGB A1C): Hemoglobin A1C: 5.2

## 2011-01-31 MED ORDER — CLONAZEPAM 1 MG PO TABS: 1.0000 mg | ORAL_TABLET | Freq: Every day | ORAL | Status: DC | PRN

## 2011-01-31 MED ORDER — LEVOTHYROXINE SODIUM 125 MCG PO TABS: 125.0000 ug | ORAL_TABLET | Freq: Every day | ORAL | Status: DC

## 2011-01-31 MED ORDER — TETANUS-DIPHTH-ACELL PERTUSSIS 5-2-15.5 LF-MCG/0.5 IM SUSP: 0.5000 mL | Freq: Once | INTRAMUSCULAR | Status: DC

## 2011-01-31 MED ORDER — CITALOPRAM HYDROBROMIDE 40 MG PO TABS: 40.0000 mg | ORAL_TABLET | Freq: Every day | ORAL | Status: DC

## 2011-01-31 NOTE — Assessment & Plan Note (Signed)
Mammogram is up to date.

## 2011-01-31 NOTE — Assessment & Plan Note (Signed)
Recheck TSH in about 4 weeks. Given lab slip today. Went ahead and sent one more refill over today.

## 2011-01-31 NOTE — Patient Instructions (Signed)
You received your tetanus and your shingles vaccine today. Please schedule a physical with pap in the next month or two.

## 2011-01-31 NOTE — Assessment & Plan Note (Signed)
Will refill her klonopin but will need to evaluate further with GAD-7 and PHQ-9 at f/u visit to see if would  Benefit from an SSRI.

## 2011-01-31 NOTE — Assessment & Plan Note (Signed)
Discussed if she is doing really well then will hold off on restarting singulair since the zyrtec has been well controlled. She uses albuterol prn.

## 2011-01-31 NOTE — Progress Notes (Addendum)
Subjective:    Patient ID: Angelica [name], female    DOB: Apr 15, 1951, 60 y.o.   MRN: [id number]  HPI Here to estab care.  Lost her health insurance last year and was going to [facility].  Just now got her insurance back.   Hypothyroid - She had a goiter and then has Radiation therapy and now on levoxyl and it was checked 2 weeks ago.    She is tamoxifen now s/p BrCa tx in 2009.  Had a left mastectomy  Hx of panic attacks so uses her klonopin occ.  Says crowds make her nervous.    Asthma - uses singulair and zyrtec.  Hasn't had to use her inhaler in awhile.     Review of Systems  Constitutional: Negative for fever, diaphoresis and unexpected weight change.  HENT: Negative for hearing loss, rhinorrhea and tinnitus.   Eyes: Negative for visual disturbance.  Respiratory: Negative for cough and wheezing.   Cardiovascular: Negative for chest pain and palpitations.  Gastrointestinal: Negative for nausea, vomiting, diarrhea and blood in stool.  Genitourinary: Negative for vaginal bleeding, vaginal discharge and difficulty urinating.  Musculoskeletal: Negative for myalgias and arthralgias.  Skin: Negative for rash.  Neurological: Negative for headaches.  Hematological: Negative for adenopathy. Does not bruise/bleed easily.  Psychiatric/Behavioral: Positive for dysphoric mood. Negative for sleep disturbance. The patient is nervous/anxious.        BP 134/77  Pulse 82  Resp 20  Ht 5\' 3"  (1.6 m)  Wt 169 lb (76.658 kg)  BMI 29.94 kg/m2  SpO2 97%  LMP 06/24/2000    Allergies  Allergen Reactions  . Macrodantin Nausea And Vomiting  . Penicillins Rash    Past Medical History  Diagnosis Date  . Asthma   . Anxiety   . Depression   . Allergy   . Breast cancer 2009  . Postmenopausal     Past Surgical History  Procedure Date  . Mastectomy 2009    BrCa  . Breast reconstruction 06/2008    History   Social History  . Marital Status: Married    Spouse Name: Angelica [name]    Number of  Children: 1  . Years of Education: HS   Occupational History  . Angelica [name]    Social History Main Topics  . Smoking status: Never Smoker   . Smokeless tobacco: Not on file  . Alcohol Use: No  . Drug Use: No  . Sexually Active: Not on file   Other Topics Concern  . Not on file   Social History Narrative   Walks 30 min per day.     Family History  Problem Relation Age of Onset  . Stroke Mother   . Hypertension Father   . Hyperlipidemia Father   . Cancer Mother     Angelica [name] does not currently have medications on file.  Objective:   Physical Exam  Constitutional: She is oriented to person, place, and time. She appears well-developed and well-nourished.  HENT:  Head: Normocephalic and atraumatic.  Cardiovascular: Normal rate, regular rhythm and normal heart sounds.        No carotid bruits.   Pulmonary/Chest: Effort normal and breath sounds normal.  Neurological: She is alert and oriented to person, place, and time.  Skin: Skin is warm and dry.  Psychiatric: She has a normal mood and affect. Her behavior is normal.          Assessment & Plan:  Also discussed needs to return for CPE with pap and will discuss  need for colonoscopy etc.  Zostavax and Tdap updated today.   She also had an abnormal fasting glucose on labs recently done at the [facility]. I did do an A1c today and anxious she did have insulin resistance or diabetes and it was completely normal.

## 2011-02-01 ENCOUNTER — Other Ambulatory Visit: Payer: Self-pay | Admitting: Family Medicine

## 2011-02-01 ENCOUNTER — Telehealth: Payer: Self-pay | Admitting: Family Medicine

## 2011-02-01 LAB — LIPID PANEL: Triglycerides: 477 mg/dL — ABNORMAL HIGH (ref <150)

## 2011-02-01 NOTE — Telephone Encounter (Signed)
CAll the lab and have them run a direct LDL.

## 2011-02-01 NOTE — Telephone Encounter (Signed)
Pt called and said she was seen yesterday and the provider was suppose to have sent her clonazepam to the [facility], but pt is saying they do not have the medication. Plan:  Called the pharm and they did not receive this medication although they received the other two meds that were sent at the same time.  Gave a verbal order on the clonazepam 1mg .  LMOM for the pt informing her will take care of. Jarvis Newcomer, LPN Domingo Dimes

## 2011-02-01 NOTE — Telephone Encounter (Signed)
Lab added

## 2011-02-02 ENCOUNTER — Telehealth: Payer: Self-pay | Admitting: Family Medicine

## 2011-02-02 MED ORDER — FISH OIL 1000 MG PO CAPS: 4.0000 | ORAL_CAPSULE | Freq: Every day | ORAL | Status: DC

## 2011-02-02 NOTE — Telephone Encounter (Signed)
Call pt: LDL looks great but TG are super high. Start with fish oil 4 caps daily and lets recheck Tg in 8 weeks. In addition to regular exercise.

## 2011-02-04 NOTE — Telephone Encounter (Signed)
Pt notified of lab results. KJ LPN

## 2011-02-04 NOTE — Telephone Encounter (Signed)
LM on VM for pt to return call. KJ LPN

## 2011-03-18 LAB — BASIC METABOLIC PANEL
BUN: 5 — ABNORMAL LOW
CO2: 27
Calcium: 9
Glucose, Bld: 100 — ABNORMAL HIGH
Sodium: 137

## 2011-03-18 LAB — CBC
Hemoglobin: 14.4
MCHC: 34.6
RDW: 13.4

## 2011-03-18 LAB — DIFFERENTIAL
Basophils Absolute: 0.1
Basophils Relative: 1
Eosinophils Relative: 2
Monocytes Absolute: 0.8
Neutro Abs: 7.5

## 2011-04-04 ENCOUNTER — Telehealth: Payer: Self-pay | Admitting: *Deleted

## 2011-04-04 DIAGNOSIS — E782 Mixed hyperlipidemia: Secondary | ICD-10-CM

## 2011-04-04 NOTE — Telephone Encounter (Signed)
Pharm changed 

## 2011-04-04 NOTE — Telephone Encounter (Signed)
Labs added.

## 2011-04-05 ENCOUNTER — Other Ambulatory Visit: Payer: Self-pay | Admitting: *Deleted

## 2011-04-05 MED ORDER — LEVOTHYROXINE SODIUM 125 MCG PO TABS: 125.0000 ug | ORAL_TABLET | Freq: Every day | ORAL | Status: DC

## 2011-04-16 ENCOUNTER — Encounter: Payer: Self-pay | Admitting: Family Medicine

## 2011-05-31 ENCOUNTER — Other Ambulatory Visit: Payer: Self-pay | Admitting: Family Medicine

## 2011-06-21 ENCOUNTER — Telehealth: Payer: Self-pay | Admitting: Oncology

## 2011-06-21 NOTE — Telephone Encounter (Signed)
APPTS MADE FOR 1/15 AND 1/22 PER [facility]AIG,PT AWARE   AOM

## 2011-06-27 ENCOUNTER — Ambulatory Visit: Payer: BC Managed Care – PPO | Admitting: Physician Assistant

## 2011-07-02 ENCOUNTER — Emergency Department (INDEPENDENT_AMBULATORY_CARE_PROVIDER_SITE_OTHER)
Admission: EM | Admit: 2011-07-02 | Discharge: 2011-07-02 | Disposition: A | Discharge status: Home / Self Care | Payer: BC Managed Care – PPO | Source: Home / Self Care | Attending: Emergency Medicine | Admitting: Emergency Medicine

## 2011-07-02 ENCOUNTER — Encounter: Payer: Self-pay | Admitting: Emergency Medicine

## 2011-07-02 DIAGNOSIS — J329 Chronic sinusitis, unspecified: Secondary | ICD-10-CM

## 2011-07-02 MED ORDER — CLARITHROMYCIN 500 MG PO TABS: 500.0000 mg | ORAL_TABLET | Freq: Two times a day (BID) | ORAL | Status: AC

## 2011-07-02 NOTE — ED Notes (Signed)
Left ear pain, Chest tightness, cough x 2 weeks, finished z-pak on Saturday, sx not better

## 2011-07-02 NOTE — ED Provider Notes (Addendum)
History     CSN: [id number]  Arrival date & time 07/02/11  1227   First MD Initiated Contact with Patient 07/02/11 1311      Chief Complaint  Patient presents with  . Otalgia    (Consider location/radiation/quality/duration/timing/severity/associated sxs/prior treatment) HPI Angelica [name] is a 61 y.o. female who complains of onset of cold symptoms for 2 weeks.  She was seen [facility] last week and given the Z-Pak. She felt better and then when she stopped the medicine she started to feel worse again.  + sore throat + cough No pleuritic pain No wheezing + nasal congestion + post-nasal drainage + L sinus pain/pressure No chest congestion No itchy/red eyes + L earache No hemoptysis No SOB No chills/sweats No fever No nausea No vomiting No abdominal pain No diarrhea No skin rashes No fatigue No myalgias No headache    Past Medical History  Diagnosis Date  . Asthma   . Anxiety   . Depression   . Allergy   . Breast cancer 2009  . Postmenopausal     Past Surgical History  Procedure Date  . Mastectomy 2009    BrCa  . Breast reconstruction 06/2008    Family History  Problem Relation Age of Onset  . Stroke Mother   . Hypertension Father   . Hyperlipidemia Father   . Cancer Mother     History  Substance Use Topics  . Smoking status: Never Smoker   . Smokeless tobacco: Not on file  . Alcohol Use: No    OB History    Grav Para Term Preterm Abortions TAB SAB Ect Mult Living                  Review of Systems  Allergies  Macrodantin and Penicillins  Home Medications   Current Outpatient Rx  Name Route Sig Dispense Refill  . VENTOLIN HFA IN Inhalation Inhale into the lungs.      . CETIRIZINE HCL 10 MG PO TABS Oral Take 10 mg by mouth daily.      [name] CITALOPRAM HYDROBROMIDE 40 MG PO TABS  TAKE ONE TABLET BY MOUTH EVERY DAY 30 tablet 3  . CLONAZEPAM 1 MG PO TABS Oral Take 1 tablet (1 mg total) by mouth daily as needed. 30 tablet 0  . LEVOTHYROXINE SODIUM  125 MCG PO TABS  TAKE ONE TABLET BY MOUTH EVERY DAY 30 tablet 3  . ONE-DAILY MULTI VITAMINS PO TABS Oral Take 1 tablet by mouth daily.      [name] FISH OIL 1000 MG PO CAPS Oral Take 4 capsules (4,000 mg total) by mouth daily.  0  . TAMOXIFEN CITRATE 20 MG PO TABS Oral Take 20 mg by mouth daily.        BP 133/76  Pulse 82  Temp(Src) 98.8 F (37.1 C) (Oral)  Resp 20  Ht 5' 3.5" (1.613 m)  Wt 166 lb (75.297 kg)  BMI 28.94 kg/m2  SpO2 97%  LMP 06/24/2000  Physical Exam  Nursing note and vitals reviewed. Constitutional: She is oriented to person, place, and time. She appears well-developed and well-nourished.  HENT:  Head: Normocephalic and atraumatic.  Right Ear: Tympanic membrane, external ear and ear canal normal.  Left Ear: Tympanic membrane, external ear and ear canal normal.  Nose: Mucosal edema and rhinorrhea present.  Mouth/Throat: Posterior oropharyngeal erythema present. No oropharyngeal exudate or posterior oropharyngeal edema.  Eyes: No scleral icterus.  Neck: Neck supple.  Cardiovascular: Regular rhythm and normal heart sounds.  Pulmonary/Chest: Effort normal and breath sounds normal. No respiratory distress.  Neurological: She is alert and oriented to person, place, and time.  Skin: Skin is warm and dry.  Psychiatric: She has a normal mood and affect. Her speech is normal.    ED Course  Procedures (including critical care time)  Labs Reviewed - No data to display No results found.   No diagnosis found.    MDM  1)  Take the prescribed antibiotic as instructed.  I offered prednisone pack, however she refused states that she may call back in a few days if not improved. 2)  Use nasal saline solution (over the counter) at least 3 times a day. 3)  Use over the counter decongestants like Zyrtec-D every 12 hours as needed to help with congestion.  If you have hypertension, do not take medicines with sudafed.  4)  Can take tylenol every 6 hours or motrin every 8 hours  for pain or fever. 5)  Follow up with your primary doctor if no improvement in 5-7 days, sooner if increasing pain, fever, or new symptoms.     Lily Kocher, MD 07/02/11 1317  Lily Kocher, MD 07/02/11 [phone]

## 2011-07-09 ENCOUNTER — Other Ambulatory Visit (HOSPITAL_BASED_OUTPATIENT_CLINIC_OR_DEPARTMENT_OTHER): Payer: BC Managed Care – PPO

## 2011-07-09 ENCOUNTER — Other Ambulatory Visit: Payer: Self-pay | Admitting: Oncology

## 2011-07-09 DIAGNOSIS — E039 Hypothyroidism, unspecified: Secondary | ICD-10-CM

## 2011-07-09 DIAGNOSIS — Z170 Estrogen receptor positive status [ER+]: Secondary | ICD-10-CM

## 2011-07-09 DIAGNOSIS — C50119 Malignant neoplasm of central portion of unspecified female breast: Secondary | ICD-10-CM

## 2011-07-09 LAB — CBC WITH DIFFERENTIAL/PLATELET
BASO%: 0.4 % (ref 0.0–2.0)
EOS%: 2.5 % (ref 0.0–7.0)
MCH: 30.2 pg (ref 25.1–34.0)
MCV: 87 fL (ref 79.5–101.0)
MONO%: 6.6 % (ref 0.0–14.0)
NEUT#: 5.2 10*3/uL (ref 1.5–6.5)
RBC: 4.64 10*6/uL (ref 3.70–5.45)
RDW: 12.8 % (ref 11.2–14.5)

## 2011-07-09 LAB — COMPREHENSIVE METABOLIC PANEL
AST: 20 U/L (ref 0–37)
Albumin: 4.1 g/dL (ref 3.5–5.2)
Alkaline Phosphatase: 87 U/L (ref 39–117)
Potassium: 4.1 mEq/L (ref 3.5–5.3)
Sodium: 139 mEq/L (ref 135–145)
Total Protein: 6.5 g/dL (ref 6.0–8.3)

## 2011-07-16 ENCOUNTER — Ambulatory Visit (HOSPITAL_BASED_OUTPATIENT_CLINIC_OR_DEPARTMENT_OTHER): Payer: BC Managed Care – PPO | Admitting: Oncology

## 2011-07-16 VITALS — BP 141/77 | HR 73 | Temp 98.4°F | Ht 63.5 in | Wt 170.3 lb

## 2011-07-16 DIAGNOSIS — C50919 Malignant neoplasm of unspecified site of unspecified female breast: Secondary | ICD-10-CM

## 2011-07-16 NOTE — Progress Notes (Signed)
ID: ODELL FASCHING  DOB: [birth year]  MR#: [id number]  CSN#: [id number]   Interval History:   Angelica [name] returns today for followup of her breast cancer the interval history is generally unremarkable. 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 They're going to be opening one near where she lives and she will likely move air. Her 2 grandchildren are now 44 and 7. She spent the holidays with family.  ROS:  She is taking tamoxifen with no side effects that she is aware of. In particular there have been no significant hot flashes or vaginal wetness. A detailed review of systems was otherwise negative except for nocturia x3. That's discussed further below.  Allergies  Allergen Reactions  . Macrodantin Nausea And Vomiting  . Penicillins Rash    Current Outpatient Prescriptions  Medication Sig Dispense Refill  . Albuterol Sulfate (VENTOLIN HFA IN) Inhale into the lungs.        . cetirizine (ZYRTEC) 10 MG tablet Take 10 mg by mouth daily.        . citalopram (CELEXA) 40 MG tablet TAKE ONE TABLET BY MOUTH EVERY DAY  30 tablet  3  . clonazePAM (KLONOPIN) 1 MG tablet Take 1 tablet (1 mg total) by mouth daily as needed.  30 tablet  0  . levothyroxine (SYNTHROID, LEVOTHROID) 125 MCG tablet TAKE ONE TABLET BY MOUTH EVERY DAY  30 tablet  3  . Multiple Vitamin (MULTIVITAMIN) tablet Take 1 tablet by mouth daily.        . Omega-3 Fatty Acids (FISH OIL) 1000 MG CAPS Take 4 capsules (4,000 mg total) by mouth daily.    0  . tamoxifen (NOLVADEX) 20 MG tablet Take 20 mg by mouth daily.         Current Facility-Administered Medications  Medication Dose Route Frequency Provider Last Rate Last Dose  . TDaP (ADACEL) injection 0.5 mL  0.5 mL Intramuscular Once Angelica Gasser, MD       PAST MEDICAL HISTORY:  Significant for tonsillectomy and adenoidectomy, status post appendectomy, status post bilateral wrist tendon release, history of depression/anxiety, history of  hypothyroidism, history of asthma, history of pneumonia, and ongoing tobacco abuse.    FAMILY HISTORY:  The patient's father is alive at age 55.  The patient's mother is alive at age 29.  The patient has two sisters.  The only breast cancer in the family was the paternal grandmother, who developed breast cancer in her sixties.  There may have been an ovarian cancer among one of the patient's father's three sisters (a paternal aunt).    GYN HISTORY:  She is GX P1.  She went through the change of life about 2 years ago.  Did not have significant problems with hot flashes.  Did not take hormone replacement.    SOCIAL HISTORY:  Angelica [name] used to work at [employer], but currently she is unemployed.  She lives with her husband of 16 years, Angelica [name], who works for [employer] in the [employer]. Her only daughter is Angelica [name], present today.  She works for the state as a Engineering geologist.  The patient has two grandchildren.  She is a member of [employer].   Objective:  Filed Vitals:   07/16/11 0956  BP: 141/77  Pulse: 73  Temp: 98.4 F (36.9 C)    BMI: Body mass index is 29.69 kg/(m^2).   ECOG FS: 0  Physical Exam:   Sclerae unicteric  Oropharynx clear  No  peripheral adenopathy  Lungs clear -- no rales or rhonchi  Heart regular rate and rhythm  Abdomen benign  MSK no focal spinal tenderness, no peripheral edema  Neuro nonfocal  Breast exam: The right breast is status post mastectomy with reconstruction. In the exam there is a subcutaneous mass measuring 2 x 1 cm, easily movable, with a central umbilication suggestive of a poor. This is consistent with a sebaceous cyst. There is no erythema tenderness or other finding of concern. The breast itself is unremarkable, status post reconstruction. The left breast was unremarkable.  Lab Results:      Chemistry      Component Value Date/Time   NA 139 07/09/2011 0952   NA 139 07/09/2011 0952   NA 139 07/09/2011 0952   K 4.1 07/09/2011  0952   K 4.1 07/09/2011 0952   K 4.1 07/09/2011 0952   CL 104 07/09/2011 0952   CL 104 07/09/2011 0952   CL 104 07/09/2011 0952   CO2 25 07/09/2011 0952   CO2 25 07/09/2011 0952   CO2 25 07/09/2011 0952   BUN 8 07/09/2011 0952   BUN 8 07/09/2011 0952   BUN 8 07/09/2011 0952   CREATININE 0.65 07/09/2011 0952   CREATININE 0.65 07/09/2011 0952   CREATININE 0.65 07/09/2011 0952      Component Value Date/Time   CALCIUM 9.0 07/09/2011 0952   CALCIUM 9.0 07/09/2011 0952   CALCIUM 9.0 07/09/2011 0952   ALKPHOS 87 07/09/2011 0952   ALKPHOS 87 07/09/2011 0952   ALKPHOS 87 07/09/2011 0952   AST 20 07/09/2011 0952   AST 20 07/09/2011 0952   AST 20 07/09/2011 0952   ALT 15 07/09/2011 0952   ALT 15 07/09/2011 0952   ALT 15 07/09/2011 0952   BILITOT 0.5 07/09/2011 0952   BILITOT 0.5 07/09/2011 0952   BILITOT 0.5 07/09/2011 0952       Lab Results  Component Value Date   WBC 9.9 07/09/2011   HGB 14.0 07/09/2011   HCT 40.3 07/09/2011   MCV 87.0 07/09/2011   PLT 231 07/09/2011   NEUTROABS 5.2 07/09/2011    Studies/Results:  Mammography in February 10, 2011 was unremarkable  Assessment: 61 year old [place] woman status post right simple mastectomy with implant reconstruction March of 2009 for a T1C N1(mic), Stage II invasive ductal carcinoma, grade 1, estrogen and progesterone receptor positive, HER-2 negative, with an MIB-1 of 7%, on tamoxifen since June of 2009 with good tolerance.   Plan: Angelica [name] is doing very well and I am not changing her tamoxifen at this time. When she reaches 5 years on this drug, which will be next year, we will consider either continuing tamoxifen for 5 years, switching to an aromatase inhibitors 5 years, or more likely given her very good overall prognosis simply discontinuing the drug. She knows to call for any problems that may develop before then.  As far as her nocturia is concerned, this is does not sound like spasms. She has no leakage or other urinary symptoms. I suggested she not  have anything to drink after supper for one week and see if that relieves that particular symptoms.  Angelica [name] C 07/16/2011

## 2011-10-06 ENCOUNTER — Other Ambulatory Visit: Payer: Self-pay | Admitting: Family Medicine

## 2011-11-05 ENCOUNTER — Other Ambulatory Visit: Payer: Self-pay | Admitting: Oncology

## 2011-11-05 DIAGNOSIS — C50919 Malignant neoplasm of unspecified site of unspecified female breast: Secondary | ICD-10-CM

## 2011-11-06 NOTE — Telephone Encounter (Signed)
Received electronic prescription request for pt's tamoxifen.  Per Mosai[facility], pt's most recent prescription was written 01/16/2011 for 90 day supply with 1 year refills, so pt should still have refills left.  Spoke with representative at pharmacy, who states the prescription they have on file is dated for 10/24/10 (which is also in [place]).  Refills given.

## 2011-11-26 ENCOUNTER — Telehealth: Payer: Self-pay | Admitting: *Deleted

## 2011-11-26 NOTE — Telephone Encounter (Signed)
This RN left message on identified VM per pt's called message stating " I called several weeks ago about getting something for bladder control ".  Message left on VM by this RN requesting a return call to discuss above as well possible need to see appropriate MD for above.

## 2011-11-28 ENCOUNTER — Other Ambulatory Visit: Payer: Self-pay | Admitting: *Deleted

## 2011-11-28 MED ORDER — OXYBUTYNIN CHLORIDE 5 MG PO TABS: 5.0000 mg | ORAL_TABLET | Freq: Two times a day (BID) | ORAL | Status: DC

## 2011-11-28 NOTE — Telephone Encounter (Signed)
Per MD review obtained prescription with recommendation for pt to call in 2 weeks with update.  Left message on pt's identified VM.  Prescription called to pharmacy.

## 2011-12-19 ENCOUNTER — Other Ambulatory Visit: Payer: Self-pay | Admitting: *Deleted

## 2011-12-25 ENCOUNTER — Ambulatory Visit (INDEPENDENT_AMBULATORY_CARE_PROVIDER_SITE_OTHER): Payer: BC Managed Care – PPO | Admitting: Physician Assistant

## 2011-12-25 ENCOUNTER — Encounter: Payer: Self-pay | Admitting: Physician Assistant

## 2011-12-25 VITALS — BP 134/67 | HR 87 | Temp 98.0°F | Ht 63.5 in | Wt 173.0 lb

## 2011-12-25 DIAGNOSIS — T2102XA Burn of unspecified degree of abdominal wall, initial encounter: Secondary | ICD-10-CM

## 2011-12-25 MED ORDER — CLONAZEPAM 1 MG PO TABS: 1.0000 mg | ORAL_TABLET | Freq: Every day | ORAL | Status: DC | PRN

## 2011-12-25 NOTE — Progress Notes (Signed)
  Subjective:    Patient ID: Angelica [name], female    DOB: 01-29-1951, 61 y.o.   MRN: [id number]  HPI Patient presents to the clinic for follow up on Burn of the abdomen. She went to the ED 3 days ago  Because spilling hot water on her abdomen while canning and they gave her vicodin for pain and silvadine to apply twice a day for burn. She has followed instructions and it seems to be doing well. Denies and fever, chills. She has had a small amt of greenish discharge from a small spot on the burn. Pain is still a 7/10 but Vicodin does help. She stills has vicodin left. .    Review of Systems     Objective:   Physical Exam  Constitutional: She appears well-developed and well-nourished.  Skin:       8inch by 6inch irregular border erythematous macule with a 1cm by 3cm 2nd degree ulceration.           Assessment & Plan:  Abdominal burn- wrote out of work until Monday. Put silvadine on wound in office today and bandaged up wound. Instructed to continue to put silvadine on burn twice a day changing dressing every time. Continue to take vicodin as needed for intense pain but can start to use Tylenol to see if that helps with pain. Discussed signs of infection and to call if there were any changes. Will recheck in 1 week.

## 2011-12-25 NOTE — Patient Instructions (Addendum)
Continue to change dressing daily apply cream twice a day. Watch out for signs of infection. Call if you see any increasing blood, pus, discharge.

## 2011-12-30 ENCOUNTER — Encounter: Payer: Self-pay | Admitting: Family Medicine

## 2011-12-30 ENCOUNTER — Ambulatory Visit (INDEPENDENT_AMBULATORY_CARE_PROVIDER_SITE_OTHER): Payer: BC Managed Care – PPO | Admitting: Family Medicine

## 2011-12-30 VITALS — BP 124/70 | HR 74 | Ht 63.0 in | Wt 172.0 lb

## 2011-12-30 DIAGNOSIS — T2132XA Burn of third degree of abdominal wall, initial encounter: Secondary | ICD-10-CM

## 2011-12-30 MED ORDER — OXYBUTYNIN CHLORIDE 5 MG PO TABS: 5.0000 mg | ORAL_TABLET | Freq: Two times a day (BID) | ORAL | Status: DC

## 2011-12-30 NOTE — Progress Notes (Signed)
  Subjective:    Patient ID: Angelica [name], female    DOB: 08/06/1950, 61 y.o.   MRN: [id number]  HPI SHe burning herself with water while canning 9 days ago. She has been washing with dove soap and applying silvadene cream. Sys no drianage. Still really tender.  Keeping covered with bandage.     Review of Systems     Objective:   Physical Exam  Constitutional: She appears well-developed and well-nourished.  HENT:  Head: Normocephalic and atraumatic.  Skin: Skin is warm and dry.       She has a very large area of the abdomen that his burn. It actually is healing very well. Good granulation tissue in place. No active drainage or weeping. The edges appear to be healing well.  Psychiatric: She has a normal mood and affect. Her behavior is normal.          Assessment & Plan:  3rd degree burn abd - continue to apply silvadene cream. F/U in 1 week to recheck.  I reapplied the cream and bandage here today after looking at the wound. She's doing a fantastic job. She notices any drainage or discharge or increasing redness around the edges and she needs to come in sooner. Okay to return to work.

## 2012-01-03 ENCOUNTER — Telehealth: Payer: Self-pay | Admitting: *Deleted

## 2012-01-03 MED ORDER — SILVER SULFADIAZINE 1 % EX CREA: TOPICAL_CREAM | Freq: Every day | CUTANEOUS | Status: DC

## 2012-01-03 NOTE — Telephone Encounter (Signed)
Pt calls and would like a refill on the burn cream you gave her

## 2012-01-03 NOTE — Telephone Encounter (Signed)
Ok, rx sent to [facility].

## 2012-01-06 ENCOUNTER — Ambulatory Visit: Payer: BC Managed Care – PPO | Admitting: Family Medicine

## 2012-01-06 DIAGNOSIS — Z0289 Encounter for other administrative examinations: Secondary | ICD-10-CM

## 2012-01-16 ENCOUNTER — Other Ambulatory Visit: Payer: BC Managed Care – PPO | Admitting: Lab

## 2012-01-23 ENCOUNTER — Telehealth: Payer: Self-pay | Admitting: *Deleted

## 2012-01-23 ENCOUNTER — Encounter: Payer: Self-pay | Admitting: Physician Assistant

## 2012-01-23 ENCOUNTER — Other Ambulatory Visit (HOSPITAL_BASED_OUTPATIENT_CLINIC_OR_DEPARTMENT_OTHER): Payer: BC Managed Care – PPO | Admitting: Lab

## 2012-01-23 ENCOUNTER — Ambulatory Visit (HOSPITAL_BASED_OUTPATIENT_CLINIC_OR_DEPARTMENT_OTHER): Payer: BC Managed Care – PPO | Admitting: Physician Assistant

## 2012-01-23 VITALS — BP 124/75 | HR 84 | Temp 98.4°F | Ht 63.0 in | Wt 172.1 lb

## 2012-01-23 DIAGNOSIS — Z780 Asymptomatic menopausal state: Secondary | ICD-10-CM

## 2012-01-23 DIAGNOSIS — E039 Hypothyroidism, unspecified: Secondary | ICD-10-CM

## 2012-01-23 DIAGNOSIS — L723 Sebaceous cyst: Secondary | ICD-10-CM

## 2012-01-23 DIAGNOSIS — C50919 Malignant neoplasm of unspecified site of unspecified female breast: Secondary | ICD-10-CM

## 2012-01-23 DIAGNOSIS — C50419 Malignant neoplasm of upper-outer quadrant of unspecified female breast: Secondary | ICD-10-CM

## 2012-01-23 DIAGNOSIS — Z170 Estrogen receptor positive status [ER+]: Secondary | ICD-10-CM

## 2012-01-23 DIAGNOSIS — Z1231 Encounter for screening mammogram for malignant neoplasm of breast: Secondary | ICD-10-CM

## 2012-01-23 LAB — CBC WITH DIFFERENTIAL/PLATELET
BASO%: 0.4 % (ref 0.0–2.0)
HCT: 38.4 % (ref 34.8–46.6)
LYMPH%: 44.8 % (ref 14.0–49.7)
MCH: 29.9 pg (ref 25.1–34.0)
MCHC: 34.3 g/dL (ref 31.5–36.0)
MONO#: 0.6 10*3/uL (ref 0.1–0.9)
NEUT%: 45.4 % (ref 38.4–76.8)
Platelets: 179 10*3/uL (ref 145–400)
WBC: 7.9 10*3/uL (ref 3.9–10.3)

## 2012-01-23 LAB — COMPREHENSIVE METABOLIC PANEL
ALT: 19 U/L (ref 0–35)
BUN: 9 mg/dL (ref 6–23)
CO2: 27 mEq/L (ref 19–32)
Creatinine, Ser: 0.7 mg/dL (ref 0.50–1.10)
Total Bilirubin: 0.3 mg/dL (ref 0.3–1.2)

## 2012-01-23 MED ORDER — TAMOXIFEN CITRATE 20 MG PO TABS: 20.0000 mg | ORAL_TABLET | Freq: Every day | ORAL | Status: DC

## 2012-01-23 NOTE — Telephone Encounter (Signed)
Bone Density, [facility]  Mammo next available; Dr. Luisa Hart next avail; Dr. Linford Arnold next avail; labs in Feb 2014, 1 week before seeing AB or GM

## 2012-01-23 NOTE — Progress Notes (Signed)
ID: Angelica [name]   DOB: 1950/08/08  MR#: [id number]  CSN#:[id number]  HISTORY OF PRESENT ILLNESS: The patient noted right nipple inversion in her right breast, and brought this to the attention of Dr. Elsie Lincoln.  On July 24, 2007, the patient had bilateral mammograms showing a spiculated mass in the upper outer right breast anteriorly.  There was no evidence of lymph nodes, and the mass was palpable by physical exam.  With ultrasound, it measured 1.7 cm, was irregular and hypoechoic.  It was biopsied the same day, and that pathology showed ([id number] and [id number]) an invasive ductal carcinoma which was 77% ER positive, 99% PR positive, with a low proliferation marker at 7% and Hercept test negative at 0%.    With that information, the patient was referred to Dr. Luisa Hart, and bilateral breast MRIs were obtained August 03, 2007.  These showed a 2.5 cm spiculated mass in the 10 o'clock subareolar region in the right breast, with associated nipple retraction.  There was no evidence of nipple invasion and the rest of the study was unremarkable.    With that information, the patient proceeded to right mastectomy and sentinel lymph node biopsy August 27, 2007.  The final pathology there ([id number]) showed a 1.7 cm invasive ductal carcinoma, grade 1, with evidence of focal lymphovascular invasion, and the single sentinel lymph node had isolated tumor cells, although the pathology report says there was a micrometastasis there.  The trick, however, is that some of the isolated tumor cells were seen in an extracapsular location.  The total amount was 1 millimeter.  But again, these were not seen by H&E.      She had her axillary lymph node dissection November 27, 2007 under [facility], and this showed [phone]) zero of eight lymph nodes involved.   Oncotype DX showed her in the low-risk category, which means her actual benefit from chemotherapy might be as low as 0%.  Accordingly, the decision was made not to proceed  with chemotherapy.  Patient was started on tamoxifen in July 2009.   INTERVAL HISTORY: Angelica [name] returns today for routine six-month followup of her right breast carcinoma. She continues to work for [employer], but the interval history is remarkable for her now working in [place] instead of [place]. This is much closer to home and she is thrilled. Her family is doing well, including her 2 grandchildren ages 35 and 59 who also live in [place].  Interval history is also remarkable for Angelica [name] having recently burned her abdomen with boiling water while canning. This was significant third degree burn, but fortunately it was limited in the area and has healed nicely.  REVIEW OF SYSTEMS: Overall Angelica [name] is feeling well. Her energy level is good. She's had no recent illnesses and denies fevers or chills. She has only occasional hot flashes which are mild. She's had no vaginal dryness, discharge, or abnormal vaginal bleeding. She denies any nausea or change in bowel habits. No cough, shortness of breath, or chest pain. No abnormal headaches or dizziness. No unusual myalgias, arthralgias, or peripheral swelling. She does have a "cyst" in her right axilla she wants me to look at today. This is occasionally sore and tender, especially if she has been moving around or working a lot.  A detailed review of systems is otherwise noncontributory.  PAST MEDICAL HISTORY: Past Medical History  Diagnosis Date  . Asthma   . Anxiety   . Depression   . Allergy   . Breast cancer 2009  .  Postmenopausal     PAST SURGICAL HISTORY: Past Surgical History  Procedure Date  . Mastectomy 2009    BrCa  . Breast reconstruction 06/2008    FAMILY HISTORY Family History  Problem Relation Age of Onset  . Stroke Mother   . Hypertension Father   . Hyperlipidemia Father   . Cancer Mother     GYNECOLOGIC HISTORY: She is GX P1.  She went through the change of life about 2 years ago.  Did not have significant problems  with hot flashes.  Did not take hormone replacement.    SOCIAL HISTORY:  Angelica [name] works at [employer] in [place].  She lives with her husband of 16 years, Angelica [name], who works for [employer] in the [employer]. Her only daughter is Angelica [name] who works for the state as a Engineering geologist.  The patient has two grandchildren.  She is a member of [employer] of [address].    ADVANCED DIRECTIVES:  HEALTH MAINTENANCE: History  Substance Use Topics  . Smoking status: Former Smoker    Quit date: 06/25/2011  . Smokeless tobacco: Not on file  . Alcohol Use: No     Colonoscopy: Oct 2012, Dr. Loreta Ave  PAP:  Due now, [facility]  Bone density: Normal, July 2011  Lipid panel:  Due now, [facility]  Allergies  Allergen Reactions  . Macrodantin Nausea And Vomiting  . Penicillins Rash    Current Outpatient Prescriptions  Medication Sig Dispense Refill  . Albuterol Sulfate (VENTOLIN HFA IN) Inhale into the lungs.        . cetirizine (ZYRTEC) 10 MG tablet Take 10 mg by mouth daily.        . citalopram (CELEXA) 40 MG tablet TAKE ONE TABLET BY MOUTH EVERY DAY  30 tablet  3  . clonazePAM (KLONOPIN) 1 MG tablet Take 1 tablet (1 mg total) by mouth daily as needed.  30 tablet  0  . levothyroxine (SYNTHROID, LEVOTHROID) 125 MCG tablet TAKE ONE TABLET BY MOUTH EVERY DAY  30 tablet  3  . Multiple Vitamin (MULTIVITAMIN) tablet Take 1 tablet by mouth daily.        . Omega-3 Fatty Acids (FISH OIL) 1000 MG CAPS Take 4 capsules (4,000 mg total) by mouth daily.    0  . oxybutynin (DITROPAN) 5 MG tablet Take 1 tablet (5 mg total) by mouth 2 (two) times daily.  60 tablet  3  . tamoxifen (NOLVADEX) 20 MG tablet Take 1 tablet (20 mg total) by mouth daily.  90 tablet  3  . silver sulfADIAZINE (SILVADENE) 1 % cream Apply topically daily.  400 g  0    OBJECTIVE: Middle-aged white female who appears comfortable and in no acute distress. Filed Vitals:   01/23/12 1505  BP: 124/75  Pulse: 84  Temp: 98.4 F (36.9 C)      Body mass index is 30.49 kg/(m^2).    ECOG FS: 0  Filed Weights   01/23/12 1505  Weight: 172 lb 1.6 oz (78.064 kg)   Physical Exam: HEENT:  Sclerae anicteric.  Oropharynx clear. Nodes:  No cervical, supraclavicular, or axillary lymphadenopathy palpated.  There is a palpable nodule measuring approximately 1.5 cm in the right axilla. It is quite superficial, easily movable, with a central umbilication suggestive of a pore with a sebaceous cyst. There is no erythema, drainage, or edema. No pain to palpation. Breast Exam:  Right breast is status post mastectomy with reconstruction. No suspicious nodularity or skin changes. No evidence of local recurrence in  the chest wall. Left breast is benign, status post mastopexy.  Breast tissue is dense, but there no masses, skin changes, or nipple inversion. Lungs:  Clear to auscultation bilaterally.  No crackles, rhonchi, or wheezes.   Heart:  Regular rate and rhythm.   Abdomen:  Soft, nontender.  Positive bowel sounds.  No organomegaly or masses palpated.  There is a hyperpigmented/mildly erythematous scar in the central portion of the lower abdomen approximately 10-11 cm in diameter, status post recent burn. Musculoskeletal:  No focal spinal tenderness to palpation.  Extremities:  Benign.  No peripheral edema or cyanosis.   Skin:  Benign.   Neuro:  Nonfocal. Alert and oriented x3.    LAB RESULTS: Lab Results  Component Value Date   WBC 7.9 01/23/2012   NEUTROABS 3.6 01/23/2012   HGB 13.2 01/23/2012   HCT 38.4 01/23/2012   MCV 87.0 01/23/2012   PLT 179 01/23/2012      Chemistry      Component Value Date/Time   NA 139 07/09/2011 0952   NA 139 07/09/2011 0952   NA 139 07/09/2011 0952   K 4.1 07/09/2011 0952   K 4.1 07/09/2011 0952   K 4.1 07/09/2011 0952   CL 104 07/09/2011 0952   CL 104 07/09/2011 0952   CL 104 07/09/2011 0952   CO2 25 07/09/2011 0952   CO2 25 07/09/2011 0952   CO2 25 07/09/2011 0952   BUN 8 07/09/2011 0952   BUN 8 07/09/2011 0952   BUN 8  07/09/2011 0952   CREATININE 0.65 07/09/2011 0952   CREATININE 0.65 07/09/2011 0952   CREATININE 0.65 07/09/2011 0952      Component Value Date/Time   CALCIUM 9.0 07/09/2011 0952   CALCIUM 9.0 07/09/2011 0952   CALCIUM 9.0 07/09/2011 0952   ALKPHOS 87 07/09/2011 0952   ALKPHOS 87 07/09/2011 0952   ALKPHOS 87 07/09/2011 0952   AST 20 07/09/2011 0952   AST 20 07/09/2011 0952   AST 20 07/09/2011 0952   ALT 15 07/09/2011 0952   ALT 15 07/09/2011 0952   ALT 15 07/09/2011 0952   BILITOT 0.5 07/09/2011 0952   BILITOT 0.5 07/09/2011 0952   BILITOT 0.5 07/09/2011 0952       Lab Results  Component Value Date   LABCA2 26 07/09/2011     STUDIES: No results found.  Left screening mammogram is due.   ASSESSMENT: 61 y.o.  Angelica [name] woman   (1)  status post right simple mastectomy with implant reconstruction March of 2009 for a T1C N1(mic), Stage II invasive ductal carcinoma, grade 1, estrogen and progesterone receptor positive, HER-2 negative, with an MIB-1 of 7%,   (2)  on tamoxifen since June of 2009 with good tolerance.   PLAN: With regards to her breast cancer, Angelica [name] continues to do well with no clinical evidence of disease recurrence. She is a little concerned about the apparent sebaceous cyst in the right axilla, and we will refer her to Dr. Luisa Hart for further evaluation. She is also due for her left screening mammogram which we will schedule for her. She is also due for her annual physical, including a Pap smear, with Dr. Linford Arnold and we will operationalize that today as well.  Angelica [name] will continue on tamoxifen which I have refilled for her today. Per Dr. Darnelle Catalan note in January of this year, we will reassess in July of 2014 and decide whether to continue tamoxifen for 5 years, switch to an aromatase inhibitor for 5 years, or more  likely, given Juaquina's very good overall prognosis, simply discontinue the drug.  Angelica [name] voices understanding and agreement with this plan, and will call with any  changes or problems.  Vonya Ohalloran    01/23/2012

## 2012-02-02 ENCOUNTER — Other Ambulatory Visit: Payer: Self-pay | Admitting: Family Medicine

## 2012-02-07 ENCOUNTER — Other Ambulatory Visit: Payer: BC Managed Care – PPO

## 2012-02-07 ENCOUNTER — Ambulatory Visit: Payer: BC Managed Care – PPO

## 2012-02-19 ENCOUNTER — Encounter (INDEPENDENT_AMBULATORY_CARE_PROVIDER_SITE_OTHER): Payer: Self-pay | Admitting: Surgery

## 2012-02-19 ENCOUNTER — Ambulatory Visit (INDEPENDENT_AMBULATORY_CARE_PROVIDER_SITE_OTHER): Payer: BC Managed Care – PPO | Admitting: Surgery

## 2012-02-19 VITALS — BP 132/84 | HR 80 | Temp 98.2°F | Resp 16 | Ht 63.5 in | Wt 170.4 lb

## 2012-02-19 DIAGNOSIS — L720 Epidermal cyst: Secondary | ICD-10-CM

## 2012-02-19 DIAGNOSIS — L723 Sebaceous cyst: Secondary | ICD-10-CM

## 2012-02-19 NOTE — Patient Instructions (Signed)

## 2012-02-19 NOTE — Progress Notes (Signed)
Patient ID: Angelica [name], female   DOB: 06-Mar-1951, 61 y.o.   MRN: [id number]  No chief complaint on file.   Angelica [name] is a 61 y.o. female.  Patient returns to office in followup due to a cyst and her right axilla. She had a right mastectomy bulbi right axillary lymph node dissection in 2009 for right breast cancer. The cyst is been married number of months and causes intermittent pain but does not drain. There is no redness or any significant increase in size. She feels well otherwise. Angelica  Past Medical History  Diagnosis Date  . Asthma   . Anxiety   . Depression   . Allergy   . Breast cancer 2009  . Postmenopausal     Past Surgical History  Procedure Date  . Mastectomy 2009    BrCa  . Breast reconstruction 06/2008    Family History  Problem Relation Age of Onset  . Stroke Mother   . Hypertension Father   . Hyperlipidemia Father   . Cancer Mother     Social History History  Substance Use Topics  . Smoking status: Former Smoker    Quit date: 06/25/2011  . Smokeless tobacco: Not on file  . Alcohol Use: No    Allergies  Allergen Reactions  . Macrodantin Nausea And Vomiting  . Penicillins Rash    Current Outpatient Prescriptions  Medication Sig Dispense Refill  . Albuterol Sulfate (VENTOLIN HFA IN) Inhale into the lungs.        . citalopram (CELEXA) 40 MG tablet TAKE ONE TABLET BY MOUTH EVERY DAY  30 tablet  1  . clonazePAM (KLONOPIN) 1 MG tablet Take 1 tablet (1 mg total) by mouth daily as needed.  30 tablet  0  . levothyroxine (SYNTHROID, LEVOTHROID) 125 MCG tablet TAKE ONE TABLET BY MOUTH EVERY DAY  30 tablet  1  . oxybutynin (DITROPAN) 5 MG tablet Take 1 tablet (5 mg total) by mouth 2 (two) times daily.  60 tablet  3  . tamoxifen (NOLVADEX) 20 MG tablet Take 1 tablet (20 mg total) by mouth daily.  90 tablet  3    Review of Systems Review of Systems  Constitutional: Negative for fever, chills and unexpected weight change.  HENT: Negative for  hearing loss, congestion, sore throat, trouble swallowing and voice change.   Eyes: Negative for visual disturbance.  Respiratory: Negative for cough and wheezing.   Cardiovascular: Negative for chest pain, palpitations and leg swelling.  Gastrointestinal: Negative for nausea, vomiting, abdominal pain, diarrhea, constipation, blood in stool, abdominal distention and anal bleeding.  Genitourinary: Negative for hematuria, vaginal bleeding and difficulty urinating.  Musculoskeletal: Negative for arthralgias.  Skin: Negative for rash and wound.  Neurological: Negative for seizures, syncope and headaches.  Hematological: Negative for adenopathy. Does not bruise/bleed easily.  Psychiatric/Behavioral: Negative for confusion.    Blood pressure 132/84, pulse 80, temperature 98.2 F (36.8 C), resp. rate 16, height 5' 3.5" (1.613 m), weight 170 lb 6.4 oz (77.293 kg), last menstrual period 06/24/2000.  Physical Exam Physical Exam  Constitutional: She is oriented to person, place, and time. She appears well-developed and well-nourished.  HENT:  Head: Normocephalic and atraumatic.  Eyes: EOM are normal. Pupils are equal, round, and reactive to light.  Neck: Normal range of motion. Neck supple.  Pulmonary/Chest:    Neurological: She is alert and oriented to person, place, and time.  Skin: Skin is warm and dry.  Psychiatric: She has a normal mood and affect.  Her behavior is normal. Judgment and thought content normal.     Assessment    Epidermal inclusion cyst right axilla 2 cm    Plan    Causing some discomfort.  Excise as outpatient.  The procedure has been discussed with the patient.  Alternative therapies have been discussed with the patient.  Operative risks include bleeding,  Infection,  Organ injury,  Nerve injury,  Blood vessel injury,  DVT,  Pulmonary embolism,  Death,  And possible reoperation.  Medical management risks include worsening of present situation.  The success of the  procedure is 50 -90 % at treating patients symptoms.  The patient understands and agrees to proceed.       Taylore Hinde A. 02/19/2012, 12:14 PM

## 2012-03-17 ENCOUNTER — Other Ambulatory Visit (INDEPENDENT_AMBULATORY_CARE_PROVIDER_SITE_OTHER): Payer: Self-pay | Admitting: Surgery

## 2012-03-17 DIAGNOSIS — L723 Sebaceous cyst: Secondary | ICD-10-CM

## 2012-03-18 ENCOUNTER — Telehealth (INDEPENDENT_AMBULATORY_CARE_PROVIDER_SITE_OTHER): Payer: Self-pay | Admitting: General Surgery

## 2012-03-18 ENCOUNTER — Encounter (INDEPENDENT_AMBULATORY_CARE_PROVIDER_SITE_OTHER): Payer: Self-pay | Admitting: General Surgery

## 2012-03-18 NOTE — Telephone Encounter (Signed)
Pt calling for a note to be sent to her employer to be out of work through Thursday.  Will FAX note to attn:  Bob at [phone].

## 2012-03-30 ENCOUNTER — Ambulatory Visit
Admission: RE | Admit: 2012-03-30 | Discharge: 2012-03-30 | Disposition: A | Discharge status: Home / Self Care | Payer: BC Managed Care – PPO | Source: Ambulatory Visit | Attending: Physician Assistant | Admitting: Physician Assistant

## 2012-03-30 DIAGNOSIS — C50919 Malignant neoplasm of unspecified site of unspecified female breast: Secondary | ICD-10-CM

## 2012-03-30 DIAGNOSIS — Z780 Asymptomatic menopausal state: Secondary | ICD-10-CM

## 2012-03-30 DIAGNOSIS — Z1231 Encounter for screening mammogram for malignant neoplasm of breast: Secondary | ICD-10-CM

## 2012-04-07 ENCOUNTER — Other Ambulatory Visit: Payer: Self-pay | Admitting: Family Medicine

## 2012-05-07 ENCOUNTER — Other Ambulatory Visit: Payer: Self-pay | Admitting: Family Medicine

## 2012-06-09 ENCOUNTER — Other Ambulatory Visit: Payer: Self-pay | Admitting: Family Medicine

## 2012-07-12 ENCOUNTER — Other Ambulatory Visit: Payer: Self-pay | Admitting: Family Medicine

## 2012-07-27 ENCOUNTER — Other Ambulatory Visit (HOSPITAL_BASED_OUTPATIENT_CLINIC_OR_DEPARTMENT_OTHER): Payer: BC Managed Care – PPO | Admitting: Lab

## 2012-07-27 DIAGNOSIS — C50919 Malignant neoplasm of unspecified site of unspecified female breast: Secondary | ICD-10-CM

## 2012-07-27 LAB — COMPREHENSIVE METABOLIC PANEL (CC13)
AST: 33 U/L (ref 5–34)
Albumin: 3.4 g/dL — ABNORMAL LOW (ref 3.5–5.0)
Alkaline Phosphatase: 85 U/L (ref 40–150)
BUN: 13.4 mg/dL (ref 7.0–26.0)
Glucose: 95 mg/dl (ref 70–99)
Potassium: 3.9 mEq/L (ref 3.5–5.1)
Total Bilirubin: 0.41 mg/dL (ref 0.20–1.20)

## 2012-07-27 LAB — CBC WITH DIFFERENTIAL/PLATELET
BASO%: 0.7 % (ref 0.0–2.0)
Basophils Absolute: 0.1 10*3/uL (ref 0.0–0.1)
EOS%: 2.7 % (ref 0.0–7.0)
HCT: 38.4 % (ref 34.8–46.6)
HGB: 13.2 g/dL (ref 11.6–15.9)
MCH: 29.3 pg (ref 25.1–34.0)
MCHC: 34.4 g/dL (ref 31.5–36.0)
MCV: 85.1 fL (ref 79.5–101.0)
MONO%: 6.3 % (ref 0.0–14.0)
NEUT%: 43.1 % (ref 38.4–76.8)
RDW: 13.1 % (ref 11.2–14.5)
lymph#: 4.2 10*3/uL — ABNORMAL HIGH (ref 0.9–3.3)

## 2012-07-29 ENCOUNTER — Ambulatory Visit (INDEPENDENT_AMBULATORY_CARE_PROVIDER_SITE_OTHER): Payer: BC Managed Care – PPO | Admitting: Family Medicine

## 2012-07-29 ENCOUNTER — Encounter: Payer: Self-pay | Admitting: Family Medicine

## 2012-07-29 VITALS — BP 140/72 | HR 76 | Ht 63.6 in | Wt 172.0 lb

## 2012-07-29 DIAGNOSIS — Z13220 Encounter for screening for lipoid disorders: Secondary | ICD-10-CM

## 2012-07-29 DIAGNOSIS — E039 Hypothyroidism, unspecified: Secondary | ICD-10-CM

## 2012-07-29 DIAGNOSIS — F411 Generalized anxiety disorder: Secondary | ICD-10-CM

## 2012-07-29 DIAGNOSIS — Z5181 Encounter for therapeutic drug level monitoring: Secondary | ICD-10-CM

## 2012-07-29 DIAGNOSIS — R9431 Abnormal electrocardiogram [ECG] [EKG]: Secondary | ICD-10-CM

## 2012-07-29 MED ORDER — CITALOPRAM HYDROBROMIDE 40 MG PO TABS: 40.0000 mg | ORAL_TABLET | Freq: Every day | ORAL | Status: DC

## 2012-07-29 MED ORDER — OXYBUTYNIN CHLORIDE 5 MG PO TABS: 5.0000 mg | ORAL_TABLET | Freq: Two times a day (BID) | ORAL | Status: DC

## 2012-07-29 MED ORDER — CLONAZEPAM 1 MG PO TABS: 1.0000 mg | ORAL_TABLET | Freq: Every day | ORAL | Status: DC | PRN

## 2012-07-29 MED ORDER — LEVOTHYROXINE SODIUM 125 MCG PO TABS: 125.0000 ug | ORAL_TABLET | Freq: Every morning | ORAL | Status: DC

## 2012-07-29 NOTE — Patient Instructions (Signed)

## 2012-07-29 NOTE — Progress Notes (Signed)
  Subjective:    Patient ID: Angelica [name], female    DOB: 1950/07/01, 62 y.o.   MRN: [id number]  HPI GAD - Mood has been up and down. She has had several stressor. Her husband lost his job she is taking care of her husband.  Daughter seperated from her husband. She is having frequent panic attacks. She thinks is primarily because of the stress. She does have her clonazepam and uses it sparingly. She would like a refill of that today. She currently is taking Celexa 40 mg. No CP or plapitations. She just feels a little bit more anxious and having difficulty concentrating. She says that she used actually be on ADD meds. I do not have this diagnosis on her problem list. She said was really helpful at the time.  Hypothyroidism-due to recheck thyroid level today. Her last check was well over a year ago. We had been on her medications which prompted her to come in today. She denies any recent weight changes. She denies any skin or hair changes.   Review of Systems     Objective:   Physical Exam  Constitutional: She is oriented to person, place, and time. She appears well-developed and well-nourished.  HENT:  Head: Normocephalic and atraumatic.  Neck: Neck supple. No thyromegaly present.  Cardiovascular: Normal rate, regular rhythm and normal heart sounds.   Pulmonary/Chest: Effort normal and breath sounds normal.  Musculoskeletal: She exhibits no edema.  Lymphadenopathy:    She has no cervical adenopathy.  Neurological: She is alert and oriented to person, place, and time.  Skin: Skin is warm and dry.  Psychiatric: She has a normal mood and affect. Her behavior is normal.          Assessment & Plan:  GAD - not as well controlled recently as she has had some increase in life stressors. We discussed working on ways to decrease her stress levels. We'll continue citalopram for now. Because she's on the 40 mg if she really needs an EKG twice a year to monitor for QT prolongation. We discussed  this potential risks. I also refilled her clonazepam to use as needed for her panic attacks.  EKG shows rate of 67 bpm, Nomral axis.  No QT prolongation.  NSR.  I would like to see her back in 6-8 weeks to make sure that she's doing well. I really think that her recent stressors have caused an increase in her anxiety but hopefully this will improve over the next month. If not then consider referral for formal counseling or possibly changing or adjusting her medication. Could consider adding BuSpar for better control.  Hypothyroid - due to recheck levels. Refill her medications today. Certainly if her thyroid is off this could also be contributing to her recent panic attacks.  Due for screening cholesterol  Obesity-she's gained 6 pounds in the last year and discussed working on diet and regular exercise to bring her weight back down.  Elevated blood pressure-normally her blood pressure looks fantastic. I would like to see her back in about 6-8 weeks to recheck and make sure that she is doing better. Hopefully she can get couple pounds off this will improve. Also make sure following a low salt diet. Handout given about the DASH diet.

## 2012-07-30 LAB — LIPID PANEL
LDL Cholesterol: 150 mg/dL — ABNORMAL HIGH (ref 0–99)
Total CHOL/HDL Ratio: 7.1 Ratio

## 2012-07-30 LAB — TSH: TSH: 0.01 u[IU]/mL — ABNORMAL LOW (ref 0.350–4.500)

## 2012-07-31 ENCOUNTER — Other Ambulatory Visit: Payer: Self-pay | Admitting: Family Medicine

## 2012-07-31 ENCOUNTER — Encounter: Payer: Self-pay | Admitting: *Deleted

## 2012-07-31 MED ORDER — LEVOTHYROXINE SODIUM 100 MCG PO CAPS: 100.0000 ug | ORAL_CAPSULE | Freq: Every morning | ORAL | Status: DC

## 2012-08-03 ENCOUNTER — Ambulatory Visit (HOSPITAL_BASED_OUTPATIENT_CLINIC_OR_DEPARTMENT_OTHER): Payer: BC Managed Care – PPO | Admitting: Physician Assistant

## 2012-08-03 ENCOUNTER — Telehealth: Payer: Self-pay | Admitting: Oncology

## 2012-08-03 ENCOUNTER — Encounter: Payer: Self-pay | Admitting: Physician Assistant

## 2012-08-03 VITALS — BP 160/76 | HR 86 | Temp 97.7°F | Resp 20 | Ht 63.6 in | Wt 175.7 lb

## 2012-08-03 DIAGNOSIS — C50919 Malignant neoplasm of unspecified site of unspecified female breast: Secondary | ICD-10-CM

## 2012-08-03 DIAGNOSIS — Z170 Estrogen receptor positive status [ER+]: Secondary | ICD-10-CM

## 2012-08-03 DIAGNOSIS — R0789 Other chest pain: Secondary | ICD-10-CM

## 2012-08-03 DIAGNOSIS — C50119 Malignant neoplasm of central portion of unspecified female breast: Secondary | ICD-10-CM

## 2012-08-03 NOTE — Progress Notes (Signed)
ID: WINNI EHRHARD   DOB: Aug 23, 1950  MR#: [id number]  CSN#:[id number]  HISTORY OF PRESENT ILLNESS: The patient noted right nipple inversion in her right breast, and brought this to the attention of Dr. Elsie Lincoln.  On July 24, 2007, the patient had bilateral mammograms showing a spiculated mass in the upper outer right breast anteriorly.  There was no evidence of lymph nodes, and the mass was palpable by physical exam.  With ultrasound, it measured 1.7 cm, was irregular and hypoechoic.  It was biopsied the same day, and that pathology showed ([id number] and [phone]) an invasive ductal carcinoma which was 77% ER positive, 99% PR positive, with a low proliferation marker at 7% and Hercept test negative at 0%.    With that information, the patient was referred to Dr. Luisa Hart, and bilateral breast MRIs were obtained August 03, 2007.  These showed a 2.5 cm spiculated mass in the 10 o'clock subareolar region in the right breast, with associated nipple retraction.  There was no evidence of nipple invasion and the rest of the study was unremarkable.    With that information, the patient proceeded to right mastectomy and sentinel lymph node biopsy August 27, 2007.  The final pathology there ([id number]) showed a 1.7 cm invasive ductal carcinoma, grade 1, with evidence of focal lymphovascular invasion, and the single sentinel lymph node had isolated tumor cells, although the pathology report says there was a micrometastasis there.  The trick, however, is that some of the isolated tumor cells were seen in an extracapsular location.  The total amount was 1 millimeter.  But again, these were not seen by H&E.      She had her axillary lymph node dissection November 27, 2007 under [facility], and this showed [phone]) zero of eight lymph nodes involved.   Oncotype DX showed her in the low-risk category, which means her actual benefit from chemotherapy might be as low as 0%.  Accordingly, the decision was made not to proceed  with chemotherapy.  Patient was started on tamoxifen in July 2009.   INTERVAL HISTORY: Hina returns today for routine six-month followup of her right breast carcinoma. She continues to work for [employer] in [place].  Her family is doing well, including her 2 grandchildren ages 26 and 65 who also live in [place].  Telisa continues on tamoxifen with good tolerance. She denies any increased hot flashes, peripheral swelling or abnormal clotting, change in vision, or vaginal dryness, discharge, or bleeding. She does note that over the past week she has been increasingly tired. She describes chest pressure and says her chest feels "heavy in the middle". She has some shortness of breath with exertion. She has felt some intermittent nausea but had no emesis. All of these issues do seem to increase with exertion and improved with rest. She continues to be followed by Dr. Linford Arnold on a regular basis. (Of note, Nyjai tells me she did not tell Dr. Linford Arnold when she saw her last week that she was experiencing these issues.)   REVIEW OF SYSTEMS: Danity has had no recent illnesses and denies fevers or chills.  She denies any change in bowel or bladder  habits. she's had no cough or phlegm production. She has occasional headaches. She feels anxious. She denies any dizziness has had no change in vision. She's had no unusual myalgias, arthralgias, bony pain, or peripheral swelling.  A detailed review of systems is otherwise noncontributory.  PAST MEDICAL HISTORY: Past Medical History  Diagnosis Date  . Asthma   .  Anxiety   . Depression   . Allergy   . Breast cancer 2009  . Postmenopausal     PAST SURGICAL HISTORY: Past Surgical History  Procedure Laterality Date  . Mastectomy  2009    BrCa  . Breast reconstruction  06/2008    FAMILY HISTORY Family History  Problem Relation Age of Onset  . Stroke Mother   . Hypertension Father   . Hyperlipidemia Father   . Cancer Mother     GYNECOLOGIC  HISTORY: She is GX P1.  She went through the change of life about 2 years ago.  Did not have significant problems with hot flashes.  Did not take hormone replacement.    SOCIAL HISTORY:  Khyla works at [employer] in [place].  She lives with her husband of 16 years, Dorene Sorrow, who works for [employer] in the [employer]. Her only daughter is Neysa Bonito who works for the state as a Engineering geologist.  The patient has two grandchildren.  She is a member of [employer] of [address].    ADVANCED DIRECTIVES:  HEALTH MAINTENANCE: History  Substance Use Topics  . Smoking status: Former Smoker    Quit date: 06/25/2011  . Smokeless tobacco: Not on file  . Alcohol Use: No     Colonoscopy: Oct 2012, Dr. Loreta Ave  PAP:  Due now, Metheney  Bone density: Normal, July 2011  Lipid panel:  Due now, Metheney  Allergies  Allergen Reactions  . Macrodantin Nausea And Vomiting  . Penicillins Rash    Current Outpatient Prescriptions  Medication Sig Dispense Refill  . Albuterol Sulfate (VENTOLIN HFA IN) Inhale into the lungs.        . citalopram (CELEXA) 40 MG tablet Take 1 tablet (40 mg total) by mouth daily.  30 tablet  5  . clonazePAM (KLONOPIN) 1 MG tablet Take 1 tablet (1 mg total) by mouth daily as needed.  30 tablet  1  . Levothyroxine Sodium 100 MCG CAPS Take 1 capsule (100 mcg total) by mouth every morning.  30 capsule  1  . oxybutynin (DITROPAN) 5 MG tablet Take 1 tablet (5 mg total) by mouth 2 (two) times daily.  60 tablet  5  . tamoxifen (NOLVADEX) 20 MG tablet Take 1 tablet (20 mg total) by mouth daily.  90 tablet  3  . cyclobenzaprine (FLEXERIL) 5 MG tablet       . diclofenac (VOLTAREN) 75 MG EC tablet       . [DISCONTINUED] oxybutynin (DITROPAN) 5 MG tablet Take 1 tablet (5 mg total) by mouth 2 (two) times daily.  60 tablet  3   No current facility-administered medications for this visit.    OBJECTIVE: Middle-aged white female who appears tired but in no acute distress Filed Vitals:    08/03/12 1530  BP: 160/76  Pulse: 86  Temp: 97.7 F (36.5 C)  Resp: 20     Body mass index is 30.56 kg/(m^2).    ECOG FS: 1  Filed Weights   08/03/12 1530  Weight: 175 lb 11.2 oz (79.697 kg)   Physical Exam: HEENT:  Sclerae anicteric.  Oropharynx clear. Nodes:  No cervical, supraclavicular, or axillary lymphadenopathy palpated.   Breast Exam:  Right breast is status post mastectomy with reconstruction. No evidence of local recurrence in the chest wall. Left breast is status post mastopexy.  Breast tissue is dense, but otherwise unremarkable Lungs:  Clear to auscultation bilaterally.  No crackles, rhonchi, or wheezes.   Heart:  Regular rate and rhythm.  Abdomen:  Soft, nontender.  Positive bowel sounds.  Musculoskeletal:  No focal spinal tenderness to palpation.  Extremities:  No peripheral edema or cyanosis.   Neuro:  Nonfocal. Well oriented   LAB RESULTS: Lab Results  Component Value Date   WBC 8.8 07/27/2012   NEUTROABS 3.8 07/27/2012   HGB 13.2 07/27/2012   HCT 38.4 07/27/2012   MCV 85.1 07/27/2012   PLT 183 07/27/2012      Chemistry      Component Value Date/Time   NA 141 07/27/2012 1529   NA 141 01/23/2012 1450   K 3.9 07/27/2012 1529   K 3.5 01/23/2012 1450   CL 108* 07/27/2012 1529   CL 105 01/23/2012 1450   CO2 22 07/27/2012 1529   CO2 27 01/23/2012 1450   BUN 13.4 07/27/2012 1529   BUN 9 01/23/2012 1450   CREATININE 0.8 07/27/2012 1529   CREATININE 0.70 01/23/2012 1450      Component Value Date/Time   CALCIUM 8.9 07/27/2012 1529   CALCIUM 9.1 01/23/2012 1450   ALKPHOS 85 07/27/2012 1529   ALKPHOS 71 01/23/2012 1450   AST 33 07/27/2012 1529   AST 26 01/23/2012 1450   ALT 20 07/27/2012 1529   ALT 19 01/23/2012 1450   BILITOT 0.41 07/27/2012 1529   BILITOT 0.3 01/23/2012 1450       Lab Results  Component Value Date   LABCA2 25 07/27/2012     STUDIES:  Most recent bone density on 03/30/2012 was normal.  Most recent left mammogram on 03/30/2012 was unremarkable with no evidence of  malignancy.   ASSESSMENT: 62 y.o.  Kathryne Sharper woman   (1)  status post right simple mastectomy with implant reconstruction March of 2009 for a T1C N1(mic), Stage II invasive ductal carcinoma, grade 1, estrogen and progesterone receptor positive, HER-2 negative, with an MIB-1 of 7%,   (2)  on tamoxifen since June of 2009 with good tolerance.   PLAN: This case was reviewed with Dr. Darnelle Catalan today. With regards to her breast cancer, Yashira continues to do well with no clinical evidence of disease recurrence. We are, however, a little concerned about the fatigue, chest pressure, and shortness of breath.  Dr. Darnelle Catalan review the results of the EKG, which was rather nonspecific and showed no evidence of MI.  Dr. Darnelle Catalan feels it would be prudent to obtain a chest CT. We'll try to have this done within the next week, and I will see her next week on February 18 to review those results. If the CT is clear, we will likely refer her back to Dr.  Linford Arnold for further evaluation and management of her comorbidities.   I did caution Nyiah to proceed to the emergency room if her chest pain/pressure increased or worsened.  Yamili will continue on tamoxifen which I have refilled for her today. Per Dr. Darnelle Catalan note in January of this year, we will reassess in July/August of 2014 and decide whether to continue tamoxifen for 5 years, switch to an aromatase inhibitor for 5 years, or more likely, given Jahmiya's very good overall prognosis, simply discontinue the drug.  Karsynn voices understanding and agreement with this plan, and will call with any changes or problems.  Shalayna Ornstein    08/03/2012

## 2012-08-03 NOTE — Telephone Encounter (Signed)
gv pt appt schedule for February and August.

## 2012-08-04 ENCOUNTER — Ambulatory Visit (HOSPITAL_COMMUNITY)
Admission: RE | Admit: 2012-08-04 | Discharge: 2012-08-04 | Disposition: A | Discharge status: Home / Self Care | Payer: BC Managed Care – PPO | Source: Ambulatory Visit | Attending: Physician Assistant | Admitting: Physician Assistant

## 2012-08-04 DIAGNOSIS — R05 Cough: Secondary | ICD-10-CM | POA: Insufficient documentation

## 2012-08-04 DIAGNOSIS — Z853 Personal history of malignant neoplasm of breast: Secondary | ICD-10-CM | POA: Insufficient documentation

## 2012-08-04 DIAGNOSIS — K8020 Calculus of gallbladder without cholecystitis without obstruction: Secondary | ICD-10-CM | POA: Insufficient documentation

## 2012-08-04 DIAGNOSIS — R918 Other nonspecific abnormal finding of lung field: Secondary | ICD-10-CM | POA: Insufficient documentation

## 2012-08-04 DIAGNOSIS — C50919 Malignant neoplasm of unspecified site of unspecified female breast: Secondary | ICD-10-CM

## 2012-08-04 DIAGNOSIS — Z9010 Acquired absence of unspecified breast and nipple: Secondary | ICD-10-CM | POA: Insufficient documentation

## 2012-08-04 DIAGNOSIS — R0789 Other chest pain: Secondary | ICD-10-CM

## 2012-08-04 DIAGNOSIS — R059 Cough, unspecified: Secondary | ICD-10-CM | POA: Insufficient documentation

## 2012-08-04 DIAGNOSIS — R079 Chest pain, unspecified: Secondary | ICD-10-CM | POA: Insufficient documentation

## 2012-08-04 MED ORDER — IOHEXOL 300 MG/ML  SOLN
80.0000 mL | Freq: Once | INTRAMUSCULAR | Status: AC | PRN
  Administered 2012-08-04: 80 mL via INTRAVENOUS

## 2012-08-06 ENCOUNTER — Other Ambulatory Visit: Payer: Self-pay | Admitting: *Deleted

## 2012-08-11 ENCOUNTER — Encounter: Payer: Self-pay | Admitting: Physician Assistant

## 2012-08-11 ENCOUNTER — Ambulatory Visit (HOSPITAL_BASED_OUTPATIENT_CLINIC_OR_DEPARTMENT_OTHER): Payer: BC Managed Care – PPO | Admitting: Physician Assistant

## 2012-08-11 VITALS — BP 155/85 | HR 72 | Temp 98.0°F | Resp 20 | Ht 63.6 in | Wt 173.8 lb

## 2012-08-11 DIAGNOSIS — F411 Generalized anxiety disorder: Secondary | ICD-10-CM

## 2012-08-11 DIAGNOSIS — Z9010 Acquired absence of unspecified breast and nipple: Secondary | ICD-10-CM

## 2012-08-11 DIAGNOSIS — C50119 Malignant neoplasm of central portion of unspecified female breast: Secondary | ICD-10-CM

## 2012-08-11 DIAGNOSIS — C50919 Malignant neoplasm of unspecified site of unspecified female breast: Secondary | ICD-10-CM

## 2012-08-11 DIAGNOSIS — Z170 Estrogen receptor positive status [ER+]: Secondary | ICD-10-CM

## 2012-08-11 NOTE — Progress Notes (Signed)
ID: Angelica [name]   DOB: May 07, 1951  MR#: [id number]  CSN#:[id number]  HISTORY OF PRESENT ILLNESS: The patient noted right nipple inversion in her right breast, and brought this to the attention of Dr. Elsie Lincoln.  On July 24, 2007, the patient had bilateral mammograms showing a spiculated mass in the upper outer right breast anteriorly.  There was no evidence of lymph nodes, and the mass was palpable by physical exam.  With ultrasound, it measured 1.7 cm, was irregular and hypoechoic.  It was biopsied the same day, and that pathology showed ([id number] and [phone]) an invasive ductal carcinoma which was 77% ER positive, 99% PR positive, with a low proliferation marker at 7% and Hercept test negative at 0%.    With that information, the patient was referred to Dr. Luisa Hart, and bilateral breast MRIs were obtained August 03, 2007.  These showed a 2.5 cm spiculated mass in the 10 o'clock subareolar region in the right breast, with associated nipple retraction.  There was no evidence of nipple invasion and the rest of the study was unremarkable.    With that information, the patient proceeded to right mastectomy and sentinel lymph node biopsy August 27, 2007.  The final pathology there ([id number]) showed a 1.7 cm invasive ductal carcinoma, grade 1, with evidence of focal lymphovascular invasion, and the single sentinel lymph node had isolated tumor cells, although the pathology report says there was a micrometastasis there.  The trick, however, is that some of the isolated tumor cells were seen in an extracapsular location.  The total amount was 1 millimeter.  But again, these were not seen by H&E.      She had her axillary lymph node dissection November 27, 2007 under [facility], and this showed [phone]) zero of eight lymph nodes involved.   Oncotype DX showed her in the low-risk category, which means her actual benefit from chemotherapy might be as low as 0%.  Accordingly, the decision was made not to proceed  with chemotherapy.  Patient was started on tamoxifen in July 2009.   INTERVAL HISTORY: Emiya returns today primarily to review the results of her recent chest CT. She was seen here last week for routine followup of her right breast carcinoma. At that time she was complaining of increased fatigue and chest pressure. An EKG in 08/03/2012 was unremarkable. Fortunately, the chest CT obtained on 08/04/2012 was also unremarkable with the exception of  cholelithiasis with no associated inflammation. Specifically, there was no evidence of recurrence or metastasis of her breast cancer.  Alicianna continues to have some anxiety for which she takes clonazepam. She continues to be followed by Dr. Linford Arnold on a regular basis. She does not feel like the chest pressure has worsened since last week and if anything, it might have slightly improved.  REVIEW OF SYSTEMS: Angelica [name] has had no recent illnesses and denies fevers or chills.  She denies any change in bowel or bladder  habits. she's had no cough or phlegm production. She has occasional headaches. She denies any dizziness has had no change in vision. She's had no unusual myalgias, arthralgias, bony pain, or peripheral swelling.  A detailed review of systems is otherwise noncontributory.  PAST MEDICAL HISTORY: Past Medical History  Diagnosis Date  . Asthma   . Anxiety   . Depression   . Allergy   . Breast cancer 2009  . Postmenopausal     PAST SURGICAL HISTORY: Past Surgical History  Procedure Laterality Date  . Mastectomy  2009  BrCa  . Breast reconstruction  06/2008    FAMILY HISTORY Family History  Problem Relation Age of Onset  . Stroke Mother   . Hypertension Father   . Hyperlipidemia Father   . Cancer Mother     GYNECOLOGIC HISTORY: She is GX P1.  She went through the change of life about 2 years ago.  Did not have significant problems with hot flashes.  Did not take hormone replacement.    SOCIAL HISTORY:  Rae works at [employer] in  [place].  She lives with her husband of 16 years, Dorene Sorrow, who works for [employer] in the [employer]. Her only daughter is Angelica [name] who works for the state as a Engineering geologist.  The patient has two grandchildren.  She is a member of [employer] of [address].    ADVANCED DIRECTIVES:  HEALTH MAINTENANCE: History  Substance Use Topics  . Smoking status: Former Smoker    Quit date: 06/25/2011  . Smokeless tobacco: Not on file  . Alcohol Use: No     Colonoscopy: Oct 2012, Dr. Loreta Ave  PAP:  Due now, Metheney  Bone density: Normal, July 2011  Lipid panel:  Due now, Metheney  Allergies  Allergen Reactions  . Macrodantin Nausea And Vomiting  . Penicillins Rash    Current Outpatient Prescriptions  Medication Sig Dispense Refill  . Albuterol Sulfate (VENTOLIN HFA IN) Inhale into the lungs.        [name] aspirin 81 MG tablet Take 81 mg by mouth daily.      . citalopram (CELEXA) 40 MG tablet Take 1 tablet (40 mg total) by mouth daily.  30 tablet  5  . clonazePAM (KLONOPIN) 1 MG tablet Take 1 tablet (1 mg total) by mouth daily as needed.  30 tablet  1  . cyclobenzaprine (FLEXERIL) 5 MG tablet       . diclofenac (VOLTAREN) 75 MG EC tablet       . Levothyroxine Sodium 100 MCG CAPS Take 1 capsule (100 mcg total) by mouth every morning.  30 capsule  1  . oxybutynin (DITROPAN) 5 MG tablet Take 1 tablet (5 mg total) by mouth 2 (two) times daily.  60 tablet  5  . tamoxifen (NOLVADEX) 20 MG tablet Take 1 tablet (20 mg total) by mouth daily.  90 tablet  3  . [DISCONTINUED] oxybutynin (DITROPAN) 5 MG tablet Take 1 tablet (5 mg total) by mouth 2 (two) times daily.  60 tablet  3   No current facility-administered medications for this visit.    OBJECTIVE: 62-year-old white female who appears tired but in no acute distress Filed Vitals:   08/11/12 1429  BP: 155/85  Pulse: 72  Temp: 98 F (36.7 C)  Resp: 20     Body mass index is 30.23 kg/(m^2).    ECOG FS: 1  Filed Weights    08/11/12 1429  Weight: 173 lb 12.8 oz (78.835 kg)   Physical Exam: Nodes:  No cervical, supraclavicular, or axillary lymphadenopathy palpated.   Breast Exam:  Deferred Lungs:  Clear to auscultation bilaterally.  No crackles, rhonchi, or wheezes.   Heart:  Regular rate and rhythm.   Neuro:  Nonfocal. Well oriented, with anxious affect   LAB RESULTS: Lab Results  Component Value Date   WBC 8.8 07/27/2012   NEUTROABS 3.8 07/27/2012   HGB 13.2 07/27/2012   HCT 38.4 07/27/2012   MCV 85.1 07/27/2012   PLT 183 07/27/2012      Chemistry  Component Value Date/Time   NA 141 07/27/2012 1529   NA 141 01/23/2012 1450   K 3.9 07/27/2012 1529   K 3.5 01/23/2012 1450   CL 108* 07/27/2012 1529   CL 105 01/23/2012 1450   CO2 22 07/27/2012 1529   CO2 27 01/23/2012 1450   BUN 13.4 07/27/2012 1529   BUN 9 01/23/2012 1450   CREATININE 0.8 07/27/2012 1529   CREATININE 0.70 01/23/2012 1450      Component Value Date/Time   CALCIUM 8.9 07/27/2012 1529   CALCIUM 9.1 01/23/2012 1450   ALKPHOS 85 07/27/2012 1529   ALKPHOS 71 01/23/2012 1450   AST 33 07/27/2012 1529   AST 26 01/23/2012 1450   ALT 20 07/27/2012 1529   ALT 19 01/23/2012 1450   BILITOT 0.41 07/27/2012 1529   BILITOT 0.3 01/23/2012 1450       Lab Results  Component Value Date   LABCA2 25 07/27/2012     STUDIES:  Most recent bone density on 03/30/2012 was normal.  Most recent left mammogram on 03/30/2012 was unremarkable with no evidence of malignancy.  Ct Chest W Contrast  08/04/2012  *RADIOLOGY REPORT*  Clinical Data: Cough, chest pain, history of right mastectomy for breast cancer  CT CHEST WITH CONTRAST  Technique:  Multidetector CT imaging of the chest was performed following the standard protocol during bolus administration of intravenous contrast.  Contrast: 80mL OMNIPAQUE IOHEXOL 300 MG/ML  SOLN  Comparison: 05/10/2009  Findings: Evaluation of the lung parenchyma is constrained by respiratory motion.  No new/suspicious pulmonary nodules.  Prior 4 mm right middle  lobe nodule is grossly unchanged (series 5/image 26).  Tiny 1-2 mm right lower lobe nodule is also unchanged (series 5/image 27).  Tiny left lower lobe nodule is not evident on the current study.  No pleural effusion or pneumothorax.  The heart is normal in size.  No pericardial effusion.  Small mediastinal lymph nodes measuring up to 7 mm short axis (for example, series 2/image 13), unchanged.  No suspicious hilar or axillary lymphadenopathy.  Status post right mastectomy with reconstruction and right axillary lymph node dissection.  Visualized upper abdomen is notable for hepatic steatosis and cholelithiasis (series 2/image 53), without associated inflammatory changes.  Mild degenerative changes of the visualized thoracolumbar spine.  IMPRESSION: Status post right mastectomy with right axillary lymph node dissection.  No evidence of recurrent or metastatic disease in the chest.  Cholelithiasis.   Original Report Authenticated By: Charline Bills, M.D.      ASSESSMENT: 62 y.o.  Kathryne Sharper woman   (1)  status post right simple mastectomy with implant reconstruction March of 2009 for a T1C N1(mic), Stage II invasive ductal carcinoma, grade 1, estrogen and progesterone receptor positive, HER-2 negative, with an MIB-1 of 7%,   (2)  on tamoxifen since June of 2009 with good tolerance.   PLAN: As noted above, Charmeka's CT is really quite unremarkable , and certainly we know that with regards to her breast cancer there is no evidence of recurrence or metastasis. She'll continue be followed very closely by Dr. Linford Arnold, and in fact is scheduled to see her again in a couple of weeks to followup on both anxiety and hypertension.  We did spend some time today discussing methods to manage stress and anxiety such as relaxation exercises, breathing exercises, or meditation.  Otherwise she will continue on tamoxifen as before. She'll return to see [place] in 6 months for repeat labs and physical exam. Celestine knows to  call prior to  that visit with any changes or problems.  Per Dr. Darnelle Catalan note in January of this year, we will reassess in July/August of 2014 and decide whether to continue tamoxifen for 5 years, switch to an aromatase inhibitor for 5 years, or more likely, given Tori's very good overall prognosis, simply discontinue the drug.    Kham Zuckerman    08/11/2012

## 2012-09-07 ENCOUNTER — Ambulatory Visit (INDEPENDENT_AMBULATORY_CARE_PROVIDER_SITE_OTHER): Payer: BC Managed Care – PPO | Admitting: Family Medicine

## 2012-09-07 ENCOUNTER — Encounter: Payer: Self-pay | Admitting: Family Medicine

## 2012-09-07 VITALS — BP 132/55 | HR 64 | Ht 63.6 in | Wt 161.0 lb

## 2012-09-07 DIAGNOSIS — F411 Generalized anxiety disorder: Secondary | ICD-10-CM

## 2012-09-07 DIAGNOSIS — E039 Hypothyroidism, unspecified: Secondary | ICD-10-CM

## 2012-09-07 MED ORDER — BUSPIRONE HCL 7.5 MG PO TABS: ORAL_TABLET | ORAL | Status: DC

## 2012-09-07 NOTE — Progress Notes (Signed)
  Subjective:    Patient ID: Angelica [name], female    DOB: 1950-11-23, 62 y.o.   MRN: [id number]  HPI Anxiety  - Doing a little better overall. Still on the citalopram 40mg . Using her clonazepam almost daily.  Says had a panic attack Saturday at [facility].  Her TSH was also too low so we called in lower dose. Husband had lost his job.  She's also on tamoxifen. She was also having more frequent panic attacks and palpitations.  hypothryroid - at her last visit she was due to recheck her thyroid levels. She was supratherapeutic on her medications so we decreased her dose to 100 mcg. Doing well on the dose, this is a little more sluggish. She still having occasional panic attacks..    Review of Systems     Objective:   Physical Exam  Constitutional: She is oriented to person, place, and time. She appears well-developed and well-nourished.  HENT:  Head: Normocephalic and atraumatic.  Cardiovascular: Normal rate, regular rhythm and normal heart sounds.   Pulmonary/Chest: Effort normal and breath sounds normal.  Neurological: She is alert and oriented to person, place, and time.  Skin: Skin is warm and dry.  Psychiatric: She has a normal mood and affect. Her behavior is normal.          Assessment & Plan:  Anixety - Uncontrolled.  GAD7 score 18. WEan citalopram down. And will start Buspar. Citalopram and several of the SSRIs tend to decrease the effectiveness of tamoxifen so may be safer to go with the choice such as BuSpar. We will start with 7.5 mg twice a day for one week and then increase to 3 times a day. Her that the goal is to hopefully decrease her use of the clonazepam the getting better control of her anxiety. She also has a lot of acute stressors going on right now. Still encouraged her to think about counseling.  Hypothyroid - Uncontrolled.  Recheck level today on lower dose and see if she is having more therapeutic level. I certainly think this could have been contributing  to some of her recent panic attacks. She will need a refill once we get her results back in.

## 2012-09-07 NOTE — Patient Instructions (Signed)
Cut citalopram in half. Take half tab daily. The start the buspar.

## 2012-09-09 ENCOUNTER — Other Ambulatory Visit: Payer: Self-pay | Admitting: Family Medicine

## 2012-09-09 MED ORDER — LEVOTHYROXINE SODIUM 88 MCG PO TABS: 88.0000 ug | ORAL_TABLET | Freq: Every day | ORAL | Status: DC

## 2012-09-23 ENCOUNTER — Encounter: Payer: Self-pay | Admitting: Physician Assistant

## 2012-09-23 ENCOUNTER — Ambulatory Visit (INDEPENDENT_AMBULATORY_CARE_PROVIDER_SITE_OTHER): Payer: BC Managed Care – PPO | Admitting: Physician Assistant

## 2012-09-23 VITALS — BP 121/68 | HR 78 | Wt 174.0 lb

## 2012-09-23 DIAGNOSIS — F411 Generalized anxiety disorder: Secondary | ICD-10-CM

## 2012-09-23 DIAGNOSIS — F410 Panic disorder [episodic paroxysmal anxiety] without agoraphobia: Secondary | ICD-10-CM

## 2012-09-23 NOTE — Patient Instructions (Addendum)
Increase buspar to 15mg  at one dosing and then every 2-3 days  increase to 15mg  at 2 doses and then all three doses at 15mg .

## 2012-09-23 NOTE — Progress Notes (Signed)
  Subjective:    Patient ID: Angelica [name], female    DOB: 06/02/1951, 62 y.o.   MRN: [id number]  HPI  GAD/panic attacks- Pt has a history of chest tightness/pain and palpitations that are ongoing. She has seen Dr. Linford Arnold in the past multiple times for this. Dr. Linford Arnold recent weaned her off of celexa and put her on buspar and klonapin. She is up to 3 times a day on buspar and klonapin 1-2 times a day. Jaynie Bream does help but she does not feel like buspar controls anxiety. Chest pain and tightness do not seem as bad but palpitations seem to get worse. She does noticed them at work only. She never has then at home. This morning she had one on the way to work. She is working at least 40 hours a week and sometimes more. She tried inhaler to make sure not lungs and did not help. Jaynie Bream does help when she takes it. Denies any numbness and tingling of extremities, headaches or vision changes. She denies any excessive caffiene intake. Does not drink alcohol.     Review of Systems     Objective:   Physical Exam  Constitutional: She is oriented to person, place, and time. She appears well-developed and well-nourished.  HENT:  Head: Normocephalic and atraumatic.  Cardiovascular: Normal rate, regular rhythm and normal heart sounds.   Pulmonary/Chest: Effort normal and breath sounds normal.  Neurological: She is alert and oriented to person, place, and time.  Skin: Skin is warm and dry.  Psychiatric: She has a normal mood and affect. Her behavior is normal.  Seems calm and not currently having palpitations.           Assessment & Plan:  GAD/panic attacks- EKG- NSR/NO acute ST elevation or depression. Pt has had cardiac work up at cancer center and stress test within last 2 years. Since klonapin does help and test are normal likely anxiety. Being on Tamoxifen poses a problem with SSRI/SNRI with absorption. On buspar will increase to 15mg  TID.Pt given instructions on how to increase. Continue on  klonapin as needed. Discussed tapering down on hours at work and see if this helps. Wrote her to decrease to 25-30 hours a week. Follow up with Dr. Linford Arnold in 4-6 weeks.   Spent 30 minutes with patient and greater than 50 percent of time was spent discussing anxiety and treatment with medications.

## 2012-09-24 ENCOUNTER — Encounter: Payer: Self-pay | Admitting: *Deleted

## 2012-09-28 ENCOUNTER — Ambulatory Visit: Payer: BC Managed Care – PPO | Admitting: Family Medicine

## 2012-09-29 ENCOUNTER — Telehealth: Payer: Self-pay | Admitting: *Deleted

## 2012-09-29 NOTE — Telephone Encounter (Signed)
Left detailed message on machine.

## 2012-09-29 NOTE — Telephone Encounter (Signed)
Have her see [facility] tomorrow. She is already close to max dose so we may need to adjust her regimen or get her in for therapy.

## 2012-09-29 NOTE — Telephone Encounter (Signed)
Pt calls & states that she is taking 15mg  tid of buspar per Lesly Rubenstein.  She states that she still "feels like she has an edge".  Pt states that someone said something to her this morning & she has cried all morning.  She states that she is not taking any clonazepam because she can't take it at work.  Please advise.

## 2012-10-02 ENCOUNTER — Telehealth: Payer: Self-pay | Admitting: Physician Assistant

## 2012-10-02 ENCOUNTER — Encounter: Payer: Self-pay | Admitting: Family Medicine

## 2012-10-02 ENCOUNTER — Ambulatory Visit (INDEPENDENT_AMBULATORY_CARE_PROVIDER_SITE_OTHER): Payer: BC Managed Care – PPO | Admitting: Family Medicine

## 2012-10-02 VITALS — BP 125/63 | HR 71 | Temp 97.8°F | Wt 172.0 lb

## 2012-10-02 DIAGNOSIS — H109 Unspecified conjunctivitis: Secondary | ICD-10-CM

## 2012-10-02 MED ORDER — POLYMYXIN B-TRIMETHOPRIM 10000-0.1 UNIT/ML-% OP SOLN: 1.0000 [drp] | OPHTHALMIC | Status: DC

## 2012-10-02 MED ORDER — BUSPIRONE HCL 15 MG PO TABS: 15.0000 mg | ORAL_TABLET | Freq: Three times a day (TID) | ORAL | Status: DC

## 2012-10-02 NOTE — Progress Notes (Signed)
CC: Angelica [name] is a 62 y.o. female is here for possible pink eye   Subjective: HPI:  Patient complains of right eye irritation and redness. Is described as a mild to moderate itching that has been present ever since awaking today. Was accompanied by grime and a matted shut right right eyelid. No discharge since then. She describes redness as peripheral more than central, however this is improved without intervention. She has mild photophobia to sunlight. She denies vision loss nor pain with movement of the eye. Does not hurt to blink. Denies fevers, chills, headache, nasal congestion, ear pain, sinus pressure, cough, sore throat. She does not wear contacts   Review Of Systems Outlined In HPI  Past Medical History  Diagnosis Date  . Asthma   . Anxiety   . Depression   . Allergy   . Breast cancer 2009  . Postmenopausal      Family History  Problem Relation Age of Onset  . Stroke Mother   . Hypertension Father   . Hyperlipidemia Father   . Cancer Mother      History  Substance Use Topics  . Smoking status: Former Smoker    Quit date: 06/25/2011  . Smokeless tobacco: Not on file  . Alcohol Use: No     Objective: Filed Vitals:   10/02/12 1015  BP: 125/63  Pulse: 71  Temp: 97.8 F (36.6 C)    General: Alert and Oriented, No Acute Distress HEENT: Pupils equal, round, reactive to light. Conjunctivae clear.  Anterior chamber without debris bilaterally. External ears unremarkable, canals clear with intact TMs with appropriate landmarks.  Middle ear appears open without effusion. Pink inferior turbinates.  Moist mucous membranes, pharynx without inflammation nor lesions.  Neck supple without palpable lymphadenopathy nor abnormal masses. Cranial nerves II through XII grossly intact Extremities: No peripheral edema.  Strong peripheral pulses.  Mental Status: No depression, anxiety, nor agitation. Skin: Warm and dry.  Assessment & Plan: Hartley was seen today for possible  pink eye.  Diagnoses and associated orders for this visit:  Conjunctivitis - trimethoprim-polymyxin b (POLYTRIM) ophthalmic solution; Place 1 drop into the right eye every 4 (four) hours. For ten days or until bottle runs out, whichever first.    Vision both eyes appropriate, doubt allergic conjunctivitis given unilateral symptoms, discussed hygiene measures to prevent spread, most likely viral however cannot rule out bacterial component therefore will cover with Polytrim. Signs and symptoms requring emergent/urgent reevaluation were discussed with the patient.  Return if symptoms worsen or fail to improve.

## 2012-10-02 NOTE — Telephone Encounter (Signed)
Patienrt's husband walk-in request to have Buspirone 7.5mg  increased to 2 tablets 3 times a day states the insurance needs new prescription. [name] increased meds but patient is a Therapist, music patient. Please send to pharmacy [facility] in [place]. Thanks Please call home phone if any problems or questions

## 2012-10-02 NOTE — Telephone Encounter (Signed)
Will send to pharmacy

## 2012-10-16 ENCOUNTER — Ambulatory Visit (INDEPENDENT_AMBULATORY_CARE_PROVIDER_SITE_OTHER): Payer: BC Managed Care – PPO | Admitting: Physician Assistant

## 2012-10-16 ENCOUNTER — Encounter: Payer: Self-pay | Admitting: Physician Assistant

## 2012-10-16 VITALS — BP 148/76 | HR 83 | Wt 174.0 lb

## 2012-10-16 DIAGNOSIS — F39 Unspecified mood [affective] disorder: Secondary | ICD-10-CM

## 2012-10-16 DIAGNOSIS — F329 Major depressive disorder, single episode, unspecified: Secondary | ICD-10-CM

## 2012-10-16 DIAGNOSIS — F32A Depression, unspecified: Secondary | ICD-10-CM

## 2012-10-16 DIAGNOSIS — F341 Dysthymic disorder: Secondary | ICD-10-CM

## 2012-10-16 MED ORDER — BUSPIRONE HCL 7.5 MG PO TABS: 7.5000 mg | ORAL_TABLET | Freq: Three times a day (TID) | ORAL | Status: DC

## 2012-10-16 MED ORDER — CLONAZEPAM 1 MG PO TABS: 1.0000 mg | ORAL_TABLET | Freq: Every day | ORAL | Status: DC | PRN

## 2012-10-16 MED ORDER — QUETIAPINE FUMARATE ER 50 MG PO TB24: ORAL_TABLET | ORAL | Status: DC

## 2012-10-16 NOTE — Progress Notes (Signed)
  Subjective:    Patient ID: Angelica [name], female    DOB: [birth year], 62 y.o.   MRN: [id number]  HPI Patient presents to the clinic for a follow up of her medication for anxiety and depression. Pt was on buspar and klonapin. She was doing well on buspar and then we increased to 15mg  TID. After increase she started noticing burst of anger where she felt like she could not control herself. 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 Little things set her off and she yells and carries on. Work hours have decreased minimally but not to where I had written her out to about 32 hours a week. Her dad fell again and is in a home. She feels very overwhelmed. She has not used her klonapin as much but has probably needed it. She is very sensitive to crying over anything. She still has a significant amount of anxiety and depression. Denies any thoughts to harm oneself or others.    Review of Systems     Objective:   Physical Exam  Constitutional: She is oriented to person, place, and time. She appears well-developed and well-nourished.  HENT:  Head: Normocephalic and atraumatic.  Cardiovascular: Normal rate, regular rhythm and normal heart sounds.   Pulmonary/Chest: Effort normal and breath sounds normal.  Neurological: She is alert and oriented to person, place, and time.  Skin: Skin is warm and dry.  Psychiatric: She has a normal mood and affect. Her behavior is normal.          Assessment & Plan:  Anxiety/depression/mood disorder- Not wanting to try SSRI because of interaction with tamoxifen. Will decrease buspar down to where she was not having any side effects. 7.5mg  TID. Continue to use klonapin as needed. Start seroquel 50mg  at night and increase  To 2 tabs after 1-2 weeks. Follow up in 6 weeks.

## 2012-10-16 NOTE — Patient Instructions (Addendum)
Decrease to 7.5mg  of buspar three times a day. Klonapin as needed. Added seroquel at bedtime.

## 2012-10-17 DIAGNOSIS — F39 Unspecified mood [affective] disorder: Secondary | ICD-10-CM | POA: Insufficient documentation

## 2012-10-26 ENCOUNTER — Ambulatory Visit: Payer: BC Managed Care – PPO | Admitting: Physician Assistant

## 2012-10-30 ENCOUNTER — Telehealth: Payer: Self-pay | Admitting: Physician Assistant

## 2012-10-30 MED ORDER — AMITRIPTYLINE HCL 50 MG PO TABS: ORAL_TABLET | ORAL | Status: DC

## 2012-10-30 NOTE — Telephone Encounter (Signed)
Since insurance will not pay for seroquel will try amitriptyline at bedtime. Follow up in 2 months.

## 2012-10-30 NOTE — Telephone Encounter (Signed)
Patient notified and will pick up med and start it. Barry Dienes, LPN

## 2012-11-13 ENCOUNTER — Other Ambulatory Visit: Payer: Self-pay | Admitting: Family Medicine

## 2012-11-27 ENCOUNTER — Encounter: Payer: Self-pay | Admitting: Physician Assistant

## 2012-11-27 ENCOUNTER — Ambulatory Visit (INDEPENDENT_AMBULATORY_CARE_PROVIDER_SITE_OTHER): Payer: BC Managed Care – PPO | Admitting: Physician Assistant

## 2012-11-27 VITALS — BP 138/73 | HR 90 | Wt 175.0 lb

## 2012-11-27 DIAGNOSIS — F39 Unspecified mood [affective] disorder: Secondary | ICD-10-CM

## 2012-11-27 DIAGNOSIS — F411 Generalized anxiety disorder: Secondary | ICD-10-CM

## 2012-11-27 MED ORDER — AMITRIPTYLINE HCL 50 MG PO TABS: ORAL_TABLET | ORAL | Status: DC

## 2012-11-27 MED ORDER — BUSPIRONE HCL 7.5 MG PO TABS: 7.5000 mg | ORAL_TABLET | Freq: Three times a day (TID) | ORAL | Status: DC

## 2012-11-27 MED ORDER — CLONAZEPAM 1 MG PO TABS: 1.0000 mg | ORAL_TABLET | Freq: Every day | ORAL | Status: DC | PRN

## 2012-11-29 NOTE — Progress Notes (Signed)
  Subjective:    Patient ID: Angelica [name], female    DOB: [birth year], 62 y.o.   MRN: [id number]  HPI Patient presents to the clinic to follow up on starting amitriptyline for episodic mood swings and GAD. She is doing much better. She has not had any outburts of anger since starting. She continues to also take buspar and occasional klonapin as needed. She is sleeping much better. She denies and depression or suidical thoughts. 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 She is very happy with the way she feels.    Review of Systems     Objective:   Physical Exam  Constitutional: She is oriented to person, place, and time. She appears well-developed and well-nourished.  HENT:  Head: Normocephalic and atraumatic.  Cardiovascular: Normal rate, regular rhythm and normal heart sounds.   Pulmonary/Chest: Effort normal and breath sounds normal.  Neurological: She is alert and oriented to person, place, and time.  Skin: Skin is warm and dry.  Psychiatric: She has a normal mood and affect. Her behavior is normal.          Assessment & Plan:  Episodic mood swings/GAD- Refilled Elavil for 6 months along with buspar and klonapin. Follow up if needed before. Continue to exercise and use behavioral techniques to decrease stress. Follow up with PCP Dr. Linford Arnold.

## 2013-01-21 ENCOUNTER — Other Ambulatory Visit (HOSPITAL_BASED_OUTPATIENT_CLINIC_OR_DEPARTMENT_OTHER): Payer: BC Managed Care – PPO | Admitting: Lab

## 2013-01-21 DIAGNOSIS — C50919 Malignant neoplasm of unspecified site of unspecified female breast: Secondary | ICD-10-CM

## 2013-01-21 LAB — CBC WITH DIFFERENTIAL/PLATELET
Eosinophils Absolute: 0.2 10*3/uL (ref 0.0–0.5)
HCT: 38.4 % (ref 34.8–46.6)
HGB: 13.1 g/dL (ref 11.6–15.9)
LYMPH%: 46.4 % (ref 14.0–49.7)
MONO#: 0.5 10*3/uL (ref 0.1–0.9)
NEUT#: 4.7 10*3/uL (ref 1.5–6.5)
NEUT%: 45.5 % (ref 38.4–76.8)
Platelets: 194 10*3/uL (ref 145–400)
RBC: 4.39 10*6/uL (ref 3.70–5.45)
WBC: 10.4 10*3/uL — ABNORMAL HIGH (ref 3.9–10.3)

## 2013-01-21 LAB — COMPREHENSIVE METABOLIC PANEL (CC13)
CO2: 22 mEq/L (ref 22–29)
Glucose: 114 mg/dl (ref 70–140)
Sodium: 142 mEq/L (ref 136–145)
Total Bilirubin: 0.6 mg/dL (ref 0.20–1.20)
Total Protein: 6.7 g/dL (ref 6.4–8.3)

## 2013-01-25 ENCOUNTER — Other Ambulatory Visit: Payer: BC Managed Care – PPO

## 2013-01-28 ENCOUNTER — Ambulatory Visit (HOSPITAL_BASED_OUTPATIENT_CLINIC_OR_DEPARTMENT_OTHER): Payer: BC Managed Care – PPO | Admitting: Oncology

## 2013-01-28 VITALS — BP 131/76 | HR 88 | Temp 98.9°F | Resp 20 | Ht 63.6 in | Wt 177.4 lb

## 2013-01-28 DIAGNOSIS — C50919 Malignant neoplasm of unspecified site of unspecified female breast: Secondary | ICD-10-CM

## 2013-01-28 NOTE — Progress Notes (Signed)
ID: ZAKYA HALABI   DOB: 1951-01-17  MR#: [id number]  CSN#:[id number]  PCP: Nani Gasser, MD GYN: SU:  OTHER MD:  HISTORY OF PRESENT ILLNESS: The patient noted right nipple inversion in her right breast, and brought this to the attention of Dr. Elsie Lincoln.  On July 24, 2007, the patient had bilateral mammograms showing a spiculated mass in the upper outer right breast anteriorly.  There was no evidence of lymph nodes, and the mass was palpable by physical exam.  With ultrasound, it measured 1.7 cm, was irregular and hypoechoic.  It was biopsied the same day, and that pathology showed ([id number] and [phone]) an invasive ductal carcinoma which was 77% ER positive, 99% PR positive, with a low proliferation marker at 7% and Hercept test negative at 0%.    With that information, the patient was referred to Dr. Luisa Hart, and bilateral breast MRIs were obtained August 03, 2007.  These showed a 2.5 cm spiculated mass in the 10 o'clock subareolar region in the right breast, with associated nipple retraction.  There was no evidence of nipple invasion and the rest of the study was unremarkable.    With that information, the patient proceeded to right mastectomy and sentinel lymph node biopsy August 27, 2007.  The final pathology there ([id number]) showed a 1.7 cm invasive ductal carcinoma, grade 1, with evidence of focal lymphovascular invasion, and the single sentinel lymph node had isolated tumor cells, although the pathology report says there was a micrometastasis there.  The trick, however, is that some of the isolated tumor cells were seen in an extracapsular location.  The total amount was 1 millimeter.  But again, these were not seen by H&E.      She had her axillary lymph node dissection November 27, 2007 under [facility], and this showed [phone]) zero of eight lymph nodes involved.   Oncotype DX showed her in the low-risk category, which means her actual benefit from chemotherapy might be as low as  0%.  Accordingly, the decision was made not to proceed with chemotherapy.  Patient was started on tamoxifen in July 2009.   INTERVAL HISTORY: Rudean returns today for followup of her stage IB breast cancer. The interval history is generally unremarkable. She is taking care of her dad, who has fallen 3 times and injured his knee. He is 52 and lives by himself.  REVIEW OF SYSTEMS: Shandrika has some seasonal allergies, which she is treating with Nasacort. She feels anxious and think she might be depressed except she takes BuSpar and that helps. Otherwise a detailed review of systems today was entirely noncontributory  PAST MEDICAL HISTORY: Past Medical History  Diagnosis Date  . Asthma   . Anxiety   . Depression   . Allergy   . Breast cancer 2009  . Postmenopausal     PAST SURGICAL HISTORY: Past Surgical History  Procedure Laterality Date  . Mastectomy  2009    BrCa  . Breast reconstruction  06/2008    FAMILY HISTORY Family History  Problem Relation Age of Onset  . Stroke Mother   . Hypertension Father   . Hyperlipidemia Father   . Cancer Mother     GYNECOLOGIC HISTORY: She is GX P1.  She went through the change of life about 2 years ago.  Did not have significant problems with hot flashes.  Did not take hormone replacement.    SOCIAL HISTORY:  Georgana works at [employer] in [place].  She lives with her husband of 16 years, Dorene Sorrow,  who works for [employer] in the Pharmacist, community. Her only daughter is Neysa Bonito who works for the state as a Engineering geologist.  The patient has two grandchildren.  She is a member of [employer] of [address].    ADVANCED DIRECTIVES: In place  HEALTH MAINTENANCE: History  Substance Use Topics  . Smoking status: Former Smoker    Quit date: 06/25/2011  . Smokeless tobacco: Not on file  . Alcohol Use: No     Colonoscopy: Oct 2012, Dr. Loreta Ave  PAP:  Due now, [facility]  Bone density: Normal, July 2011  Lipid panel:  Due now, [facility]  Allergies   Allergen Reactions  . Macrodantin Nausea And Vomiting  . Penicillins Rash    Current Outpatient Prescriptions  Medication Sig Dispense Refill  . Albuterol Sulfate (VENTOLIN HFA IN) Inhale into the lungs.        [name] amitriptyline (ELAVIL) 50 MG tablet Take one tab at bedtime.  30 tablet  6  . aspirin 81 MG tablet Take 81 mg by mouth daily.      . busPIRone (BUSPAR) 7.5 MG tablet Take 1 tablet (7.5 mg total) by mouth 3 (three) times daily.  90 tablet  6  . clonazePAM (KLONOPIN) 1 MG tablet Take 1 tablet (1 mg total) by mouth daily as needed.  30 tablet  1  . levothyroxine (SYNTHROID, LEVOTHROID) 88 MCG tablet TAKE ONE TABLET BY MOUTH ONCE DAILY  30 tablet  3  . oxybutynin (DITROPAN) 5 MG tablet Take 1 tablet (5 mg total) by mouth 2 (two) times daily.  60 tablet  5  . tamoxifen (NOLVADEX) 20 MG tablet Take 1 tablet (20 mg total) by mouth daily.  90 tablet  3  . trimethoprim-polymyxin b (POLYTRIM) ophthalmic solution Place 1 drop into the right eye every 4 (four) hours. For ten days or until bottle runs out, whichever first.  10 mL  0  . [DISCONTINUED] oxybutynin (DITROPAN) 5 MG tablet Take 1 tablet (5 mg total) by mouth 2 (two) times daily.  60 tablet  3   No current facility-administered medications for this visit.    OBJECTIVE: Middle-aged white female who appears stated age 40 Vitals:   01/28/13 1534  BP: 131/76  Pulse: 88  Temp: 98.9 F (37.2 C)  Resp: 20     Body mass index is 30.85 kg/(m^2).    ECOG FS: 1  Filed Weights   01/28/13 1534  Weight: 177 lb 6.4 oz (80.468 kg)   Sclerae unicteric Oropharynx clear No cervical or supraclavicular adenopathy Lungs no rales or rhonchi Heart regular rate and rhythm Abd obese, benign MSK no focal spinal tenderness, no peripheral edema Neuro: nonfocal, well oriented, appropriate affect Breasts: The right breast is status post mastectomy. There is no evidence of local recurrence. The right axilla is benign. The left breast is  unremarkable.  LAB RESULTS: Lab Results  Component Value Date   WBC 10.4* 01/21/2013   NEUTROABS 4.7 01/21/2013   HGB 13.1 01/21/2013   HCT 38.4 01/21/2013   MCV 87.5 01/21/2013   PLT 194 01/21/2013      Chemistry      Component Value Date/Time   NA 142 01/21/2013 1558   NA 141 01/23/2012 1450   K 3.6 01/21/2013 1558   K 3.5 01/23/2012 1450   CL 108* 07/27/2012 1529   CL 105 01/23/2012 1450   CO2 22 01/21/2013 1558   CO2 27 01/23/2012 1450   BUN 8.8 01/21/2013 1558   BUN 9  01/23/2012 1450   CREATININE 0.8 01/21/2013 1558   CREATININE 0.70 01/23/2012 1450      Component Value Date/Time   CALCIUM 9.1 01/21/2013 1558   CALCIUM 9.1 01/23/2012 1450   ALKPHOS 82 01/21/2013 1558   ALKPHOS 71 01/23/2012 1450   AST 52* 01/21/2013 1558   AST 26 01/23/2012 1450   ALT 28 01/21/2013 1558   ALT 19 01/23/2012 1450   BILITOT 0.60 01/21/2013 1558   BILITOT 0.3 01/23/2012 1450       Lab Results  Component Value Date   LABCA2 25 07/27/2012     STUDIES:  Most recent bone density on 03/30/2012 was normal.  Most recent left mammogram on 03/30/2012 was unremarkable with no evidence of malignancy.  Ct Chest W Contrast  08/04/2012  *RADIOLOGY REPORT*  Clinical Data: Cough, chest pain, history of right mastectomy for breast cancer  CT CHEST WITH CONTRAST  Technique:  Multidetector CT imaging of the chest was performed following the standard protocol during bolus administration of intravenous contrast.  Contrast: 80mL OMNIPAQUE IOHEXOL 300 MG/ML  SOLN  Comparison: 05/10/2009  Findings: Evaluation of the lung parenchyma is constrained by respiratory motion.  No new/suspicious pulmonary nodules.  Prior 4 mm right middle lobe nodule is grossly unchanged (series 5/image 26).  Tiny 1-2 mm right lower lobe nodule is also unchanged (series 5/image 27).  Tiny left lower lobe nodule is not evident on the current study.  No pleural effusion or pneumothorax.  The heart is normal in size.  No pericardial effusion.  Small mediastinal lymph  nodes measuring up to 7 mm short axis (for example, series 2/image 13), unchanged.  No suspicious hilar or axillary lymphadenopathy.  Status post right mastectomy with reconstruction and right axillary lymph node dissection.  Visualized upper abdomen is notable for hepatic steatosis and cholelithiasis (series 2/image 53), without associated inflammatory changes.  Mild degenerative changes of the visualized thoracolumbar spine.  IMPRESSION: Status post right mastectomy with right axillary lymph node dissection.  No evidence of recurrent or metastatic disease in the chest.  Cholelithiasis.   Original Report Authenticated By: Charline Bills, M.D.      ASSESSMENT: 62 y.o.  Kathryne Sharper woman   (1)  status post right simple mastectomy with implant reconstruction March of 2009 for a T1C N1(mic), Stage IB invasive ductal carcinoma, grade 1, estrogen and progesterone receptor positive, HER-2 negative, with an MIB-1 of 7%,   (2)  on tamoxifen between June of 2009 and August of 2014   PLAN: Dorothia has completed 5 years of antiestrogen therapy. We have data that continuing tamoxifen an additional 5 years may reduce the risk of recurrence 3% or so and I generally recommend that in patients who are at significant risk. In [place] I am comfortable with her going off the medication and we discussed discontinuing followup here. She understands she will need yearly left mammography and yearly physician breast exam. She is delighted to "get out of the cancer business" and understands that we will be glad to see her at any point in the future if the need arises. At this point we are making no further routine appointment for her here. Jagar Lua C    01/28/2013

## 2013-02-01 ENCOUNTER — Ambulatory Visit: Payer: BC Managed Care – PPO | Admitting: Oncology

## 2013-02-02 ENCOUNTER — Other Ambulatory Visit: Payer: Self-pay | Admitting: Family Medicine

## 2013-03-02 ENCOUNTER — Other Ambulatory Visit: Payer: Self-pay | Admitting: Family Medicine

## 2013-04-19 ENCOUNTER — Ambulatory Visit (INDEPENDENT_AMBULATORY_CARE_PROVIDER_SITE_OTHER): Payer: BC Managed Care – PPO | Admitting: Physician Assistant

## 2013-04-19 ENCOUNTER — Encounter: Payer: Self-pay | Admitting: Physician Assistant

## 2013-04-19 ENCOUNTER — Ambulatory Visit (INDEPENDENT_AMBULATORY_CARE_PROVIDER_SITE_OTHER): Payer: BC Managed Care – PPO

## 2013-04-19 VITALS — BP 122/67 | HR 89 | Temp 98.1°F | Wt 177.0 lb

## 2013-04-19 DIAGNOSIS — R0789 Other chest pain: Secondary | ICD-10-CM

## 2013-04-19 DIAGNOSIS — J069 Acute upper respiratory infection, unspecified: Secondary | ICD-10-CM

## 2013-04-19 DIAGNOSIS — G4700 Insomnia, unspecified: Secondary | ICD-10-CM

## 2013-04-19 DIAGNOSIS — J45901 Unspecified asthma with (acute) exacerbation: Secondary | ICD-10-CM

## 2013-04-19 MED ORDER — PREDNISONE 50 MG PO TABS: ORAL_TABLET | ORAL | Status: DC

## 2013-04-19 MED ORDER — ALBUTEROL SULFATE (2.5 MG/3ML) 0.083% IN NEBU
2.5000 mg | INHALATION_SOLUTION | Freq: Once | RESPIRATORY_TRACT | Status: AC
  Administered 2013-04-19: 2.5 mg via RESPIRATORY_TRACT

## 2013-04-19 NOTE — Progress Notes (Signed)
  Subjective:    Patient ID: Angelica [name], female    DOB: 1950/12/09, 62 y.o.   MRN: [id number]  HPI Patient is a 62 year old female who presents to the clinic with chief complaint of chest tightness and sinus pressure. Symptoms just started this morning. She did go to the [employer] this weekend. She has not tried anything to make better. Her chest feels very tight like she did when she had walking pneumonia. She has not used her Ventolin inhaler. She denies any fever, chills, sore throat, ear pain, nausea, vomiting. She does have sinus pressure in her head. She does have a dry cough that is worse at night. She denies any production.  Patient would also like an increase in Elavil. It is helping with her sleeping significantly but does see that there is ready for improvement. Like to know if we could increase today. Denies any significant side effects.   Review of Systems     Objective:   Physical Exam  Constitutional: She is oriented to person, place, and time. She appears well-developed and well-nourished.  HENT:  Head: Normocephalic and atraumatic.  Right Ear: External ear normal.  Left Ear: External ear normal.  Nose: Nose normal.  Mouth/Throat: Oropharynx is clear and moist.  Eyes: Conjunctivae are normal. Right eye exhibits no discharge. Left eye exhibits no discharge.  Neck: Normal range of motion. Neck supple.  Cardiovascular: Normal rate, regular rhythm and normal heart sounds.   Pulmonary/Chest:  Decreased lung effort at first auscultation. After nebulizer or air movement was heard throughout lungs. There was some wheezing at Apex of both lungs. Dry cough noted on todays exam.   Lymphadenopathy:    She has no cervical adenopathy.  Neurological: She is alert and oriented to person, place, and time.  Skin: Skin is warm and dry.  Psychiatric: She has a normal mood and affect. Her behavior is normal.          Assessment & Plan:  URI/asthma exacerbation-nebulizer of  albuterol 2.5 mg was given in office today. Chest x-ray was done and negative for any acute pulmonary disease. 5 days of prednisone was given to the patient. Patient was encouraged to use her Ventolin inhaler more frequently as she experiences shortness of breath or chest tightness. Patient was given handout on symptomatic care for upper respiratory infections. Call if not improving.   Insomnia-discuss with patient she could increase to 250 mg tabs of Elavil at night. If working properly can call office and was sent over new prescription of feet 100 mg tablet.

## 2013-04-19 NOTE — Patient Instructions (Addendum)
Use your inhaler as needed for chest tightness/wheezing. Start prednisone for 5 days. Get chest xray. Start mucinex.   Upper Respiratory Infection, Adult An upper respiratory infection (URI) is also known as the common cold. It is often caused by a type of germ (virus). Colds are easily spread (contagious). You can pass it to others by kissing, coughing, sneezing, or drinking out of the same glass. Usually, you get better in 1 or 2 weeks.  HOME CARE   Only take medicine as told by your doctor.  Use a warm mist humidifier or breathe in steam from a hot shower.  Drink enough water and fluids to keep your pee (urine) clear or pale yellow.  Get plenty of rest.  Return to work when your temperature is back to normal or as told by your doctor. You may use a face mask and wash your hands to stop your cold from spreading. GET HELP RIGHT AWAY IF:   After the first few days, you feel you are getting worse.  You have questions about your medicine.  You have chills, shortness of breath, or brown or red spit (mucus).  You have yellow or brown snot (nasal discharge) or pain in the face, especially when you bend forward.  You have a fever, puffy (swollen) neck, pain when you swallow, or white spots in the back of your throat.  You have a bad headache, ear pain, sinus pain, or chest pain.  You have a high-pitched whistling sound when you breathe in and out (wheezing).  You have a lasting cough or cough up blood.  You have sore muscles or a stiff neck. MAKE SURE YOU:   Understand these instructions.  Will watch your condition.  Will get help right away if you are not doing well or get worse. Document Released: 11/27/2007 Document Revised: 09/02/2011 Document Reviewed: 10/15/2010 [facility] Patient Information 2014 [place], [place].

## 2013-04-20 ENCOUNTER — Encounter: Payer: Self-pay | Admitting: *Deleted

## 2013-04-20 ENCOUNTER — Telehealth: Payer: Self-pay | Admitting: *Deleted

## 2013-04-20 NOTE — Telephone Encounter (Signed)
Pt left a message stating that she feels like she needs one extra day home for work.  She wants to know if you could write her out from yesterday until thurs.

## 2013-04-20 NOTE — Telephone Encounter (Signed)
Ok for note 

## 2013-04-21 NOTE — Telephone Encounter (Signed)
Note given to pt

## 2013-05-05 ENCOUNTER — Other Ambulatory Visit: Payer: Self-pay

## 2013-05-05 DIAGNOSIS — Z9011 Acquired absence of right breast and nipple: Secondary | ICD-10-CM

## 2013-05-05 DIAGNOSIS — Z1231 Encounter for screening mammogram for malignant neoplasm of breast: Secondary | ICD-10-CM

## 2013-05-26 ENCOUNTER — Ambulatory Visit (INDEPENDENT_AMBULATORY_CARE_PROVIDER_SITE_OTHER): Payer: BC Managed Care – PPO | Admitting: Family Medicine

## 2013-05-26 ENCOUNTER — Encounter: Payer: Self-pay | Admitting: Family Medicine

## 2013-05-26 VITALS — BP 139/74 | HR 93 | Temp 98.5°F | Wt 176.0 lb

## 2013-05-26 DIAGNOSIS — A499 Bacterial infection, unspecified: Secondary | ICD-10-CM

## 2013-05-26 DIAGNOSIS — B9689 Other specified bacterial agents as the cause of diseases classified elsewhere: Secondary | ICD-10-CM

## 2013-05-26 DIAGNOSIS — J329 Chronic sinusitis, unspecified: Secondary | ICD-10-CM

## 2013-05-26 DIAGNOSIS — G4700 Insomnia, unspecified: Secondary | ICD-10-CM

## 2013-05-26 MED ORDER — DOXYCYCLINE HYCLATE 100 MG PO TABS: ORAL_TABLET | ORAL | Status: AC

## 2013-05-26 MED ORDER — AMITRIPTYLINE HCL 100 MG PO TABS: ORAL_TABLET | ORAL | Status: DC

## 2013-05-26 NOTE — Progress Notes (Signed)
CC: Angelica [name] is a 62 y.o. female is here for Sinusitis   Subjective: HPI:  Complains of facial pressure localized to the the left eye that has been present for the last week worsening on a daily basis gradually. Now moderate in severity with moderate nasal congestion. Symptoms are present all hours of the day. Has been accompanied by sore throat without fevers nor chills nor cough nor shortness of breath. No benefit from Tylenol or Benadryl.  Patient is requesting if she can have her insomnia medication, amitriptyline, adjusted. She reports racing thoughts but nothing in particular that causes her to have difficulty with getting to sleep but not staying asleep. Symptoms are moderate in severity slightly improved since starting 50 mg many months ago. Denies side effects such as dry mouth, mental sluggishness, confusion, nor coordination difficulty   Review Of Systems Outlined In HPI  Past Medical History  Diagnosis Date  . Asthma   . Anxiety   . Depression   . Allergy   . Breast cancer 2009  . Postmenopausal      Family History  Problem Relation Age of Onset  . Stroke Mother   . Hypertension Father   . Hyperlipidemia Father   . Cancer Mother      History  Substance Use Topics  . Smoking status: Former Smoker    Quit date: 06/25/2011  . Smokeless tobacco: Not on file  . Alcohol Use: No     Objective: Filed Vitals:   05/26/13 1547  BP: 139/74  Pulse: 93  Temp: 98.5 F (36.9 C)    General: Alert and Oriented, No Acute Distress HEENT: Pupils equal, round, reactive to light. Conjunctivae clear.  External ears unremarkable, canals clear with intact TMs with appropriate landmarks.  Middle ear appears open without effusion. Pink inferior turbinates.  Moist mucous membranes, pharynx without inflammation nor lesions however moderate cobblestoning.  Neck supple without palpable lymphadenopathy nor abnormal masses. Lungs: Clear to auscultation bilaterally, no  wheezing/ronchi/rales.  Comfortable work of breathing. Good air movement. Cardiac: Regular rate and rhythm. Normal S1/S2.  No murmurs, rubs, nor gallops.   Mental Status: No depression, anxiety, nor agitation. Skin: Warm and dry.  Assessment & Plan: Sharron was seen today for sinusitis.  Diagnoses and associated orders for this visit:  Insomnia - amitriptyline (ELAVIL) 100 MG tablet; Take one tab at bedtime.  Bacterial sinusitis - doxycycline (VIBRA-TABS) 100 MG tablet; One by mouth twice a day for ten days.    Insomnia: Chronic uncontrolled condition increasing amitriptyline return in 2 weeks if not improving, to be evaluated by PCP Bacterial sinusitis: Start doxycycline consider Advil cold and sinus to help with symptoms  Return if symptoms worsen or fail to improve.

## 2013-05-31 ENCOUNTER — Ambulatory Visit: Payer: BC Managed Care – PPO | Admitting: Physician Assistant

## 2013-06-02 ENCOUNTER — Ambulatory Visit
Admission: RE | Admit: 2013-06-02 | Discharge: 2013-06-02 | Disposition: A | Discharge status: Home / Self Care | Payer: BC Managed Care – PPO | Source: Ambulatory Visit

## 2013-06-02 DIAGNOSIS — Z1231 Encounter for screening mammogram for malignant neoplasm of breast: Secondary | ICD-10-CM

## 2013-06-02 DIAGNOSIS — Z9011 Acquired absence of right breast and nipple: Secondary | ICD-10-CM

## 2013-07-09 ENCOUNTER — Encounter: Payer: Self-pay | Admitting: Emergency Medicine

## 2013-07-09 ENCOUNTER — Emergency Department
Admission: EM | Admit: 2013-07-09 | Discharge: 2013-07-09 | Disposition: A | Discharge status: Home / Self Care | Payer: BC Managed Care – PPO | Source: Home / Self Care

## 2013-07-09 DIAGNOSIS — L509 Urticaria, unspecified: Secondary | ICD-10-CM

## 2013-07-09 HISTORY — DX: Disorder of thyroid, unspecified: E07.9

## 2013-07-09 MED ORDER — HYDROXYZINE HCL 10 MG PO TABS: 10.0000 mg | ORAL_TABLET | ORAL | Status: DC | PRN

## 2013-07-09 MED ORDER — METHYLPREDNISOLONE SODIUM SUCC 125 MG IJ SOLR
125.0000 mg | INTRAMUSCULAR | Status: AC
  Administered 2013-07-09: 125 mg via INTRAMUSCULAR

## 2013-07-09 MED ORDER — PREDNISONE (PAK) 10 MG PO TABS: ORAL_TABLET | ORAL | Status: DC

## 2013-07-09 NOTE — ED Provider Notes (Signed)
CSN: [id number]     Arrival date & time 07/09/13  1537 History   None    Chief Complaint  Patient presents with  . Rash   (Consider location/radiation/quality/duration/timing/severity/associated sxs/prior Treatment) Patient is a 63 y.o. female presenting with rash and allergic reaction. The history is provided by the patient.  Rash Allergic Reaction Presenting symptoms: difficulty swallowing (mild), itching and rash   Presenting symptoms: no difficulty breathing, no swelling and no wheezing   Severity:  Severe (Itch/rash) Prior allergic episodes:  No prior episodes Context: no new detergents/soaps   Context comment:  Does not recall any new allergen Relieved by: Benadryl by mouth helped a little, but then symptoms returned. Ineffective treatments:  Antihistamines  No fever or chills or URI symptoms. History of asthma, but denies any wheezing or respiratory symptoms or shortness of breath. No chest pain. No syncope or focal neurologic symptoms She tried to work today, but felt drowsy on the Benadryl Past Medical History  Diagnosis Date  . Asthma   . Anxiety   . Depression   . Allergy   . Breast cancer 2009  . Postmenopausal   . Thyroid disease    Past Surgical History  Procedure Laterality Date  . Mastectomy  2009    BrCa  . Breast reconstruction  06/2008  . Adenoidectomy    . Tonsillectomy     Family History  Problem Relation Age of Onset  . Stroke Mother   . Cancer Mother     colon?  [name] Hypertension Father   . Hyperlipidemia Father    History  Substance Use Topics  . Smoking status: Former Smoker    Quit date: 06/25/2011  . Smokeless tobacco: Not on file  . Alcohol Use: No   OB History   Grav Para Term Preterm Abortions TAB SAB Ect Mult Living                 Review of Systems  HENT: Positive for trouble swallowing (mild). Negative for facial swelling and rhinorrhea.   Respiratory: Negative.  Negative for wheezing.   Skin: Positive for itching and rash.   All other systems reviewed and are negative.    Allergies  Macrodantin; Sulfa antibiotics; and Penicillins  Home Medications   Current Outpatient Rx  Name  Route  Sig  Dispense  Refill  . tamoxifen (NOLVADEX) 20 MG tablet   Oral   Take 20 mg by mouth daily.         . Albuterol Sulfate (VENTOLIN HFA IN)   Inhalation   Inhale into the lungs.           [name] amitriptyline (ELAVIL) 100 MG tablet      Take one tab at bedtime.   30 tablet   6   . aspirin 81 MG tablet   Oral   Take 81 mg by mouth daily.         . busPIRone (BUSPAR) 7.5 MG tablet   Oral   Take 1 tablet (7.5 mg total) by mouth 3 (three) times daily.   90 tablet   6   . clonazePAM (KLONOPIN) 1 MG tablet   Oral   Take 1 tablet (1 mg total) by mouth daily as needed.   30 tablet   1   . hydrOXYzine (ATARAX/VISTARIL) 10 MG tablet   Oral   Take 1 tablet (10 mg total) by mouth every 4 (four) hours as needed for itching. Caution: May cause drowsiness   21 tablet  0   . levothyroxine (SYNTHROID, LEVOTHROID) 88 MCG tablet      TAKE ONE TABLET BY MOUTH ONCE DAILY   30 tablet   4   . oxybutynin (DITROPAN) 5 MG tablet      TAKE ONE TABLET BY MOUTH TWICE DAILY   60 tablet   4   . predniSONE (DELTASONE) 50 MG tablet      Take one tablet for 5 days.   5 tablet   0   . predniSONE (STERAPRED UNI-PAK) 10 MG tablet      Take as directed for 6 days.--Take 6 on day 1, 5 on day 2, 4 on day 3, then 3 tablets on day 4, then 2 tablets on day 5, then 1 on day 6.   21 tablet   0    BP 141/80  Pulse 105  Temp(Src) 98.5 F (36.9 C) (Oral)  Resp 18  Ht 5' 3.5" (1.613 m)  Wt 176 lb (79.833 kg)  BMI 30.68 kg/m2  SpO2 98%  LMP 06/24/2000 Physical Exam  Nursing note and vitals reviewed. Constitutional: She is oriented to person, place, and time. She appears well-developed and well-nourished. No distress.  Uncomfortable from urticarial rash  HENT:  Head: Normocephalic and atraumatic.  Right Ear:  External ear normal.  Left Ear: External ear normal.  Nose: Nose normal.  Mouth/Throat: Oropharynx is clear and moist. No uvula swelling. No oropharyngeal exudate or posterior oropharyngeal edema.  Airway intact.  No lip swelling  Eyes: Conjunctivae and EOM are normal. Pupils are equal, round, and reactive to light. Right eye exhibits no discharge. Left eye exhibits no discharge. No scleral icterus.  Neck: Normal range of motion. Neck supple. No JVD present. No tracheal deviation present.  Cardiovascular: Normal rate, regular rhythm and normal heart sounds.   Pulmonary/Chest: Effort normal and breath sounds normal. She has no decreased breath sounds. She has no wheezes.  Abdominal: She exhibits no distension.  Musculoskeletal: Normal range of motion.  Lymphadenopathy:    She has no cervical adenopathy.  Neurological: She is alert and oriented to person, place, and time.  Skin: Skin is warm. Rash noted. Rash is urticarial. She is not diaphoretic.  Diffuse, erythematous, blanching urticarial rash on neck, face, trunk and extremities, few areas are excoriated but no evidence of infection. No fluctuance or pustules or vesicles  Psychiatric: She has a normal mood and affect.    ED Course  Procedures (including critical care time) Labs Review Labs Reviewed - No data to display Imaging Review No results found.  EKG Interpretation    Date/Time:    Ventricular Rate:    PR Interval:    QRS Duration:   QT Interval:    QTC Calculation:   R Axis:     Text Interpretation:              MDM   1. Urticaria of unknown origin    Treatment options discussed, as well as risks, benefits, alternatives. Patient voiced understanding and agreement with the following plans: Depo-Medrol 125 mg IM stat Hydroxyzine 10 mg by mouth every 4-6 hours when necessary itch Also, Zyrtec 10 mg, 1 every morning--advised her that Zyrtec is not drowsy. Prednisone 10 mg-6 day Dosepak Other symptomatic  care discussed. Note for work, excuse from work through Sunday 07/11/13, anticipate returning to work 07/12/2013. Follow-up with your primary care doctor in 2-3 days if not improving, or sooner if symptoms become worse. If any severe, worsening symptoms or problems breathing, call 911  and go to ER stat. Precautions discussed. Red flags discussed. Questions invited and answered. Patient voiced understanding and agreement.     Jacqulyn Cane, MD 07/09/13 [phone]

## 2013-07-09 NOTE — ED Notes (Signed)
Pt c/o rash all over x 2 days. She reports taking Benadryl with some relief. She also c/o dry mouth.

## 2013-07-15 ENCOUNTER — Encounter: Payer: Self-pay | Admitting: Family Medicine

## 2013-07-15 ENCOUNTER — Ambulatory Visit (INDEPENDENT_AMBULATORY_CARE_PROVIDER_SITE_OTHER): Payer: BC Managed Care – PPO | Admitting: Family Medicine

## 2013-07-15 VITALS — BP 126/67 | HR 96 | Temp 98.1°F | Ht 63.6 in | Wt 174.0 lb

## 2013-07-15 DIAGNOSIS — F329 Major depressive disorder, single episode, unspecified: Secondary | ICD-10-CM

## 2013-07-15 DIAGNOSIS — F419 Anxiety disorder, unspecified: Principal | ICD-10-CM

## 2013-07-15 DIAGNOSIS — F341 Dysthymic disorder: Secondary | ICD-10-CM

## 2013-07-15 DIAGNOSIS — F32A Depression, unspecified: Secondary | ICD-10-CM

## 2013-07-15 MED ORDER — SERTRALINE HCL 50 MG PO TABS: ORAL_TABLET | ORAL | Status: DC

## 2013-07-15 NOTE — Patient Instructions (Signed)
Decrease buspar to once a day and after 5 days can stop it STart sertraline.  Take half a tab daily for 5 days then increase to whole tab daily.

## 2013-07-15 NOTE — Addendum Note (Signed)
Addended by: Beatrice Lecher D on: 07/15/2013 10:58 PM   Modules accepted: Orders

## 2013-07-15 NOTE — Progress Notes (Addendum)
   Subjective:    Patient ID: Angelica [name], female    DOB: 17-Mar-1951, 63 y.o.   MRN: [id number]  HPI Work has been really stressful.  Feels like her boss is "after her" at times.  He has commented that she has been forgetful, slurs her speech at times, and has lack of concentration.  She has felt really down as well.  She loves being around her grandkids.  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 Her sister-in-law passed away. She works long shifts and has long periods where she can't eat.  She is off tamoxifen.  She has been celexa and did ok on it but took for year.  She does feel overwhelmed at times is tearful today here in the office. She denies thoughts of suicide but sometimes just wants to be alone. She still finds joy in seeing her grandchildren. She says she gets very anxious almost panicky when she first gets the work. She will experience chest tightness and shortness of breath. She's only been taking her BuSpar twice a day instead of 3 times a day because she doesn't get a break in the middle of her work shift and time to take the medication.  Review of Systems     Objective:   Physical Exam  Constitutional: She is oriented to person, place, and time. She appears well-developed and well-nourished.  HENT:  Head: Normocephalic and atraumatic.  Eyes: Conjunctivae and EOM are normal.  Cardiovascular: Normal rate.   Pulmonary/Chest: Effort normal.  Neurological: She is alert and oriented to person, place, and time.  Skin: Skin is dry. No pallor.  Psychiatric: She has a normal mood and affect. Her behavior is normal. Judgment and thought content normal.  Tearful          Assessment & Plan:  Anxiety/Depression - Will wean the Buspar and start zoloft.  I think she may do better on this regimen. We could also consider retrying the Celexa since she was on it for years and did well with it. She feels like it was effective but felt like it could be better which is why she  is interested in trying something new. I'm going to go ahead and write her out of work for 3 weeks so that we can work on changing her medication regimen around. Also she is amenable to seeing a therapist or counselor which I think can be very helpful for her. She does have a stressful job and is also the primary caretaker for her father who is ill which is also very stressful. Continue amitriptyline at bedtime for sleep. She says she rarely takes the Klonopin.  PHQ- 9 score of 9 today (mild).  GAD-7 score of 21 (severe) today.   Time spent 25 minutes, greater than 50% of the time spent counseling for anxiety and depression.

## 2013-07-19 ENCOUNTER — Telehealth: Payer: Self-pay | Admitting: *Deleted

## 2013-07-19 DIAGNOSIS — Z0289 Encounter for other administrative examinations: Secondary | ICD-10-CM

## 2013-07-19 NOTE — Telephone Encounter (Signed)
Pt's fmla paperwork faxed, scanned and mailed to pt. Copy made and placed in matthews box.Angelica [name] [place]

## 2013-07-19 NOTE — Telephone Encounter (Signed)
Called and lvm informing pt that her forms are completed.Angelica [name]

## 2013-07-21 NOTE — Telephone Encounter (Signed)
Forms faxed to barry. Called pt and lvm indicating that this has been done.Audelia Hives [place]

## 2013-07-30 ENCOUNTER — Ambulatory Visit: Payer: BC Managed Care – PPO | Admitting: Family Medicine

## 2013-08-03 ENCOUNTER — Ambulatory Visit (INDEPENDENT_AMBULATORY_CARE_PROVIDER_SITE_OTHER): Payer: BC Managed Care – PPO | Admitting: Physician Assistant

## 2013-08-03 ENCOUNTER — Ambulatory Visit: Payer: BC Managed Care – PPO | Admitting: Family Medicine

## 2013-08-03 ENCOUNTER — Encounter: Payer: Self-pay | Admitting: Physician Assistant

## 2013-08-03 VITALS — BP 139/73 | HR 93 | Wt 177.0 lb

## 2013-08-03 DIAGNOSIS — F329 Major depressive disorder, single episode, unspecified: Secondary | ICD-10-CM

## 2013-08-03 DIAGNOSIS — F32A Depression, unspecified: Secondary | ICD-10-CM

## 2013-08-03 DIAGNOSIS — E039 Hypothyroidism, unspecified: Secondary | ICD-10-CM

## 2013-08-03 DIAGNOSIS — F419 Anxiety disorder, unspecified: Principal | ICD-10-CM

## 2013-08-03 DIAGNOSIS — F341 Dysthymic disorder: Secondary | ICD-10-CM

## 2013-08-03 DIAGNOSIS — F411 Generalized anxiety disorder: Secondary | ICD-10-CM

## 2013-08-03 DIAGNOSIS — G4700 Insomnia, unspecified: Secondary | ICD-10-CM

## 2013-08-03 MED ORDER — CITALOPRAM HYDROBROMIDE 20 MG PO TABS: 20.0000 mg | ORAL_TABLET | Freq: Every day | ORAL | Status: DC

## 2013-08-03 MED ORDER — AMITRIPTYLINE HCL 100 MG PO TABS: ORAL_TABLET | ORAL | Status: DC

## 2013-08-03 NOTE — Patient Instructions (Addendum)
Wrote out for 3 more weeks. Follow up in 3 weeks.  Will refer to psychiatrist.

## 2013-08-03 NOTE — Progress Notes (Signed)
   Subjective:    Patient ID: Angelica [name], female    DOB: July 21, 1950, 63 y.o.   MRN: [id number]  HPI Pt presents to the clinic for 3 week follow up on anxiety and depression. Dr. Madilyn Fireman wrote pt out of work for 3 weeks and started on zoloft. Pt has had less panic attacks but continues to have signifcant anxiety and feels like zoloft is making her jumpy. She would like ot try something else. She does admit to having painic attacks every time she passes [employer], where she worked. She tried to get appt downstairs with dr. Mamie Nick but he is leaving and did not want to establish with him. Not taking buspar or vistaril. Denies any thoughts of suicide. She is sleeping better. She has started noticing that she has started having the urge to pull her hair mostly at night. She wakes up pulling out her hair. This is concerning. Pt does not feel like she can go back to work. She feels like she will break down.    Review of Systems     Objective:   Physical Exam  Constitutional: She appears well-developed and well-nourished.  HENT:  Head: Normocephalic and atraumatic.  Cardiovascular: Normal rate, regular rhythm and normal heart sounds.   Pulmonary/Chest: Effort normal and breath sounds normal. She has no wheezes.  Psychiatric: She has a normal mood and affect. Her behavior is normal.          Assessment & Plan:  Anxiety/depression/hypothyroidism/trichotillamania-  GAD-7 was 21. PHQ-9 was 5. Depression has decreased. Anxiety they same. Panic attacks are much better. TSH was low at last check would like to recheck could be affecting anxiety. Pt feels jumpy on Zoloft and not much improvement on anxiety. Stop zoloft. Start celexa 20mg  daily. Wrote out of work for 3 more weeks. Pt needs psych evaluation at this point to continue writing out of work. Continue to take klonapin as needed for panic attacks.   Insomnia- refilled elavil.   Spent 25 minutes with patient and greater than 50 percent of visit spent  counseling patient about anxiety and depression.

## 2013-08-04 ENCOUNTER — Other Ambulatory Visit: Payer: Self-pay | Admitting: Physician Assistant

## 2013-08-04 LAB — TSH: TSH: 6.214 u[IU]/mL — ABNORMAL HIGH (ref 0.350–4.500)

## 2013-08-04 MED ORDER — LEVOTHYROXINE SODIUM 75 MCG PO TABS: 75.0000 ug | ORAL_TABLET | Freq: Every day | ORAL | Status: DC

## 2013-08-16 ENCOUNTER — Telehealth: Payer: Self-pay | Admitting: *Deleted

## 2013-08-16 NOTE — Telephone Encounter (Signed)
Have Jenn try to get her in [place]. Needs to see a [place] psychiatrist. We cn only write her out for those 3 weeks.  REally needs to give new med 3 weeks to start working

## 2013-08-16 NOTE — Telephone Encounter (Signed)
Pt called and stated that the earliest that she can see someone downstairs is the second week in April. She reports that [name] has written her out of work for 3 weeks which will be the 1 week in march. She is unsure as to what she should do. She doesn't know if she is to go back to work or get another work note Please advise.Angelica [name]

## 2013-08-17 ENCOUNTER — Telehealth: Payer: Self-pay | Admitting: *Deleted

## 2013-08-17 NOTE — Telephone Encounter (Signed)
Called and lvm on pt's vm informing her of recommendations. Asked for her to call back .Angelica [name]

## 2013-08-17 NOTE — Telephone Encounter (Signed)
Pt is asking for a note to extend her out of work or to saying when she can return to work

## 2013-08-18 NOTE — Telephone Encounter (Signed)
What is the status with psych evaluation appt? I also need to see her if can't get in yet with psych to assess status of celexa.

## 2013-08-18 NOTE — Telephone Encounter (Signed)
See previous phone note. Pt is aware of providers reccomendations

## 2013-08-20 ENCOUNTER — Ambulatory Visit (INDEPENDENT_AMBULATORY_CARE_PROVIDER_SITE_OTHER): Payer: BC Managed Care – PPO | Admitting: Physician Assistant

## 2013-08-20 ENCOUNTER — Encounter: Payer: Self-pay | Admitting: Physician Assistant

## 2013-08-20 VITALS — BP 114/61 | HR 91 | Wt 180.0 lb

## 2013-08-20 DIAGNOSIS — F410 Panic disorder [episodic paroxysmal anxiety] without agoraphobia: Secondary | ICD-10-CM

## 2013-08-20 DIAGNOSIS — F3289 Other specified depressive episodes: Secondary | ICD-10-CM

## 2013-08-20 DIAGNOSIS — N3281 Overactive bladder: Secondary | ICD-10-CM

## 2013-08-20 DIAGNOSIS — E039 Hypothyroidism, unspecified: Secondary | ICD-10-CM

## 2013-08-20 DIAGNOSIS — F32A Depression, unspecified: Secondary | ICD-10-CM

## 2013-08-20 DIAGNOSIS — F329 Major depressive disorder, single episode, unspecified: Secondary | ICD-10-CM

## 2013-08-20 DIAGNOSIS — N318 Other neuromuscular dysfunction of bladder: Secondary | ICD-10-CM

## 2013-08-20 DIAGNOSIS — G4700 Insomnia, unspecified: Secondary | ICD-10-CM

## 2013-08-20 DIAGNOSIS — F419 Anxiety disorder, unspecified: Secondary | ICD-10-CM

## 2013-08-20 DIAGNOSIS — F411 Generalized anxiety disorder: Secondary | ICD-10-CM

## 2013-08-20 MED ORDER — OXYBUTYNIN CHLORIDE 5 MG PO TABS: ORAL_TABLET | ORAL | Status: DC

## 2013-08-20 MED ORDER — CITALOPRAM HYDROBROMIDE 40 MG PO TABS: 40.0000 mg | ORAL_TABLET | Freq: Every day | ORAL | Status: DC

## 2013-08-20 MED ORDER — LEVOTHYROXINE SODIUM 100 MCG PO TABS: 100.0000 ug | ORAL_TABLET | Freq: Every day | ORAL | Status: DC

## 2013-08-20 NOTE — Patient Instructions (Addendum)
Sent over celexa 40mg .  Will write out for 4 weeks. Continue to take klonapin as needed.  Increased levothyroxine 167mcg. Recheck in 6 weeks.

## 2013-08-20 NOTE — Progress Notes (Signed)
   Subjective:    Patient ID: Angelica [name], female    DOB: 1950/10/29, 63 y.o.   MRN: [id number]  HPI Patient is a 63 year old female who presents to the clinic to followup on anxiety, depression, panic attacks. She has been written work for the past month and a half related to work related anxiety and depression. She feels she is doing much better on Celexa 20 mg. She has had the best improvement on this medication as any. She still admits to having panic attacks anytime she hears, sees [employer] where she used to work. The filling manager who calls her to have the breakdown is still working at [employer] and does not feel like she can go back. At home she has a much better quality of life and able to handle situations. She has appointment with psychiatrist at the beginning of April which was the soonest they could see her. She is requesting that we write her out of work at least until that evaluation. With increase of Elavil patient is sleeping much better. Patient continues to use Klonopin as needed but on average we use once a day if at all.  OAB-needs refill on to [place]. Controlled on medication.  Hypothyroidism-patient asked because she was confused with nurse call. She has not made any changes with her medication. She is continued on the 88 MCG daily. Nurse called but she cannot remember if she says she should increase or decrease the dose. She would like clarification. She does feel like she has been more hungry lately and been much more hot.     Review of Systems     Objective:   Physical Exam  Constitutional: She is oriented to person, place, and time. She appears well-developed and well-nourished.  Cardiovascular: Normal rate, regular rhythm and normal heart sounds.   Pulmonary/Chest: Effort normal and breath sounds normal.  Neurological: She is alert and oriented to person, place, and time.  Skin: Skin is dry.  Psychiatric: She has a normal mood and affect. Her behavior is  normal.          Assessment & Plan:  Anxiety/depression/panic attack- discuss with patient on very excited about the benefit of Celexa. Will increase to 40 mg daily. Will send another referral for psychiatric evaluation to hopefully get him sooner than April. I did go ahead and right outpatient from work for 4 weeks until potential psychiatric evaluation. Continue to use Klonopin as needed for panic attacks. Discussed with patient the possibility of trying to find a job since she seems to be a trigger.  Insomnia-controlled with current dose of Elavil. Reeducated on good bedtime routine.  OAB-controlled on dig pain. Refilled medication for 6 months today.  Hypothyroidism-apparently there was some mixup with Monday. I sent a decrease her Synthroid to the pharmacy when I should have sent an increase to the pharmacy. However patient reports that she is still taking the 38mcg and never picked up the decrease. Will send over Synthroid 100 mg. Her last TSH was actually elevated meaning she is in a hypothyroid state. Will recheck levels in 6 weeks.   Spent 30 minutes with patient greater than 50% of visit spent counseling patient regarding medication management.

## 2013-08-23 ENCOUNTER — Encounter: Payer: Self-pay | Admitting: *Deleted

## 2013-08-23 NOTE — Addendum Note (Signed)
Addended by: Teddy Spike on: 08/23/2013 05:25 PM   Modules accepted: Orders

## 2013-11-09 ENCOUNTER — Ambulatory Visit (INDEPENDENT_AMBULATORY_CARE_PROVIDER_SITE_OTHER): Payer: BC Managed Care – PPO | Admitting: Physician Assistant

## 2013-11-09 ENCOUNTER — Encounter: Payer: Self-pay | Admitting: Physician Assistant

## 2013-11-09 VITALS — BP 137/65 | HR 79 | Ht 63.6 in | Wt 183.0 lb

## 2013-11-09 DIAGNOSIS — S90569A Insect bite (nonvenomous), unspecified ankle, initial encounter: Secondary | ICD-10-CM

## 2013-11-09 DIAGNOSIS — W57XXXA Bitten or stung by nonvenomous insect and other nonvenomous arthropods, initial encounter: Secondary | ICD-10-CM

## 2013-11-09 DIAGNOSIS — E039 Hypothyroidism, unspecified: Secondary | ICD-10-CM

## 2013-11-09 DIAGNOSIS — R21 Rash and other nonspecific skin eruption: Secondary | ICD-10-CM

## 2013-11-09 MED ORDER — DOXYCYCLINE HYCLATE 100 MG PO TABS: ORAL_TABLET | ORAL | Status: DC

## 2013-11-09 NOTE — Patient Instructions (Signed)
Tick Bite Information Ticks are insects that attach themselves to the skin and draw blood for food. There are various types of ticks. Common types include wood ticks and deer ticks. Most ticks live in shrubs and grassy areas. Ticks can climb onto your body when you make contact with leaves or grass where the tick is waiting. The most common places on the body for ticks to attach themselves are the scalp, neck, armpits, waist, and groin. Most tick bites are harmless, but sometimes ticks carry germs that cause diseases. These germs can be spread to a person during the tick's feeding process. The chance of a disease spreading through a tick bite depends on:   The type of tick.  Time of year.   How long the tick is attached.   Geographic location.  HOW CAN YOU PREVENT TICK BITES? Take these steps to help prevent tick bites when you are outdoors:  Wear protective clothing. Long sleeves and long pants are best.   Wear white clothes so you can see ticks more easily.  Tuck your pant legs into your socks.   If walking on a trail, stay in the middle of the trail to avoid brushing against bushes.  Avoid walking through areas with long grass.  Put insect repellent on all exposed skin and along boot tops, pant legs, and sleeve cuffs.   Check clothing, hair, and skin repeatedly and before going inside.   Brush off any ticks that are not attached.  Take a shower or bath as soon as possible after being outdoors.  WHAT IS THE PROPER WAY TO REMOVE A TICK? Ticks should be removed as soon as possible to help prevent diseases caused by tick bites. 1. If latex gloves are available, put them on before trying to remove a tick.  2. Using fine-point tweezers, grasp the tick as close to the skin as possible. You may also use curved forceps or a tick removal tool. Grasp the tick as close to its head as possible. Avoid grasping the tick on its body. 3. Pull gently with steady upward pressure until  the tick lets go. Do not twist the tick or jerk it suddenly. This may break off the tick's head or mouth parts. 4. Do not squeeze or crush the tick's body. This could force disease-carrying fluids from the tick into your body.  5. After the tick is removed, wash the bite area and your hands with soap and water or other disinfectant such as alcohol. 6. Apply a small amount of antiseptic cream or ointment to the bite site.  7. Wash and disinfect any instruments that were used.  Do not try to remove a tick by applying a hot match, petroleum jelly, or fingernail polish to the tick. These methods do not work and may increase the chances of disease being spread from the tick bite.  WHEN SHOULD YOU SEEK MEDICAL CARE? Contact your health care provider if you are unable to remove a tick from your skin or if a part of the tick breaks off and is stuck in the skin.  After a tick bite, you need to be aware of signs and symptoms that could be related to diseases spread by ticks. Contact your health care provider if you develop any of the following in the days or weeks after the tick bite:  Unexplained fever.  Rash. A circular rash that appears days or weeks after the tick bite may indicate the possibility of Lyme disease. The rash may resemble   a target with a bull's-eye and may occur at a different part of your body than the tick bite.  Redness and swelling in the area of the tick bite.   Tender, swollen lymph glands.   Diarrhea.   Weight loss.   Cough.   Fatigue.   Muscle, joint, or bone pain.   Abdominal pain.   Headache.   Lethargy or a change in your level of consciousness.  Difficulty walking or moving your legs.   Numbness in the legs.   Paralysis.  Shortness of breath.   Confusion.   Repeated vomiting.  Document Released: 06/07/2000 Document Revised: 03/31/2013 Document Reviewed: 11/18/2012 [employer] Patient Information 2014 [employer].  

## 2013-11-09 NOTE — Progress Notes (Signed)
   Subjective:    Patient ID: Angelica [name], female    DOB: Nov 20, 1950, 63 y.o.   MRN: [id number]  HPI  Patient is a 63 year old female who presents to the clinic after pulling a tick off her left hip last night. She woke up this morning with a raised red rash as well as feeling nauseated and achy. She denies any fever or chills. She does feel fatigued and under the weather. She is very achy. She has not tried anything to make better and nothing seems to make worse.  She is also due to have her thyroid checked do to medication change. Patient has been taking 100 MCG of levothyroxine. It was increased at last visit.   Review of Systems     Objective:   Physical Exam  Constitutional: She is oriented to person, place, and time. She appears well-developed and well-nourished.  HENT:  Head: Normocephalic and atraumatic.  Cardiovascular: Normal rate, regular rhythm and normal heart sounds.   Pulmonary/Chest: Effort normal and breath sounds normal. She has no wheezes.  Musculoskeletal:       Legs: Neurological: She is alert and oriented to person, place, and time.  Psychiatric: She has a normal mood and affect. Her behavior is normal.          Assessment & Plan:  Tick bite/rash- discuss with patient rash is not the typical bull's-eye rash associated with lymes disease however it is still early in the exposure to tick. Since patient is having systemic symptoms. We'll treat with 200 mg doxycycline once for Lyme's disease. Will check blood for wants IgM and IGG. Discussed prevention is the best mechanism stands take and tick borne illnesses. Continue to stay hydrated. 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 Followup as needed or if symptoms worsening.    Hypothyroidism-will check TSH and free T4 today to see if 100 MCG of levothyroxine is adequate to control thyroid.

## 2013-11-10 ENCOUNTER — Other Ambulatory Visit: Payer: Self-pay | Admitting: Physician Assistant

## 2013-11-10 LAB — LYME ABY, WSTRN BLT IGG & IGM W/BANDS
B burgdorferi IgG Abs (IB): NEGATIVE
B burgdorferi IgM Abs (IB): NEGATIVE
LYME DISEASE 23 KD IGG: NONREACTIVE
LYME DISEASE 30 KD IGG: NONREACTIVE
LYME DISEASE 39 KD IGG: NONREACTIVE
LYME DISEASE 39 KD IGM: NONREACTIVE
LYME DISEASE 41 KD IGG: NONREACTIVE
LYME DISEASE 93 KD IGG: NONREACTIVE
Lyme Disease 18 kD IgG: NONREACTIVE
Lyme Disease 23 kD IgM: NONREACTIVE
Lyme Disease 28 kD IgG: NONREACTIVE
Lyme Disease 41 kD IgM: NONREACTIVE
Lyme Disease 45 kD IgG: NONREACTIVE
Lyme Disease 58 kD IgG: NONREACTIVE
Lyme Disease 66 kD IgG: NONREACTIVE

## 2013-11-10 LAB — T4, FREE: FREE T4: 0.84 ng/dL (ref 0.80–1.80)

## 2013-11-10 LAB — TSH: TSH: 7.072 u[IU]/mL — AB (ref 0.350–4.500)

## 2013-11-10 MED ORDER — LEVOTHYROXINE SODIUM 112 MCG PO TABS: 112.0000 ug | ORAL_TABLET | Freq: Every day | ORAL | Status: DC

## 2013-11-17 ENCOUNTER — Other Ambulatory Visit: Payer: Self-pay | Admitting: *Deleted

## 2013-11-17 MED ORDER — ALBUTEROL SULFATE HFA 108 (90 BASE) MCG/ACT IN AERS: 1.0000 | INHALATION_SPRAY | RESPIRATORY_TRACT | Status: DC | PRN

## 2014-01-07 ENCOUNTER — Other Ambulatory Visit: Payer: Self-pay | Admitting: Physician Assistant

## 2014-02-06 ENCOUNTER — Emergency Department
Admission: EM | Admit: 2014-02-06 | Discharge: 2014-02-06 | Disposition: A | Discharge status: Home / Self Care | Payer: BC Managed Care – PPO | Source: Home / Self Care | Attending: Family Medicine | Admitting: Family Medicine

## 2014-02-06 ENCOUNTER — Encounter: Payer: Self-pay | Admitting: Emergency Medicine

## 2014-02-06 ENCOUNTER — Emergency Department (INDEPENDENT_AMBULATORY_CARE_PROVIDER_SITE_OTHER): Payer: BC Managed Care – PPO

## 2014-02-06 DIAGNOSIS — S80211A Abrasion, right knee, initial encounter: Secondary | ICD-10-CM

## 2014-02-06 DIAGNOSIS — M25569 Pain in unspecified knee: Secondary | ICD-10-CM

## 2014-02-06 DIAGNOSIS — IMO0002 Reserved for concepts with insufficient information to code with codable children: Secondary | ICD-10-CM

## 2014-02-06 DIAGNOSIS — M25561 Pain in right knee: Secondary | ICD-10-CM

## 2014-02-06 IMAGING — CR DG CHEST 2V
2 series · 2 of 2 positions shown · non-contrast
Comparison: Chest CT 08/04/2012. Most recent radiograph of
04/27/2009.

CLINICAL DATA: Chest tightness. History of asthma and breast
cancer. Ex-smoker.

EXAM:
CHEST  2 VIEW

[view not recorded (1 of 2)]
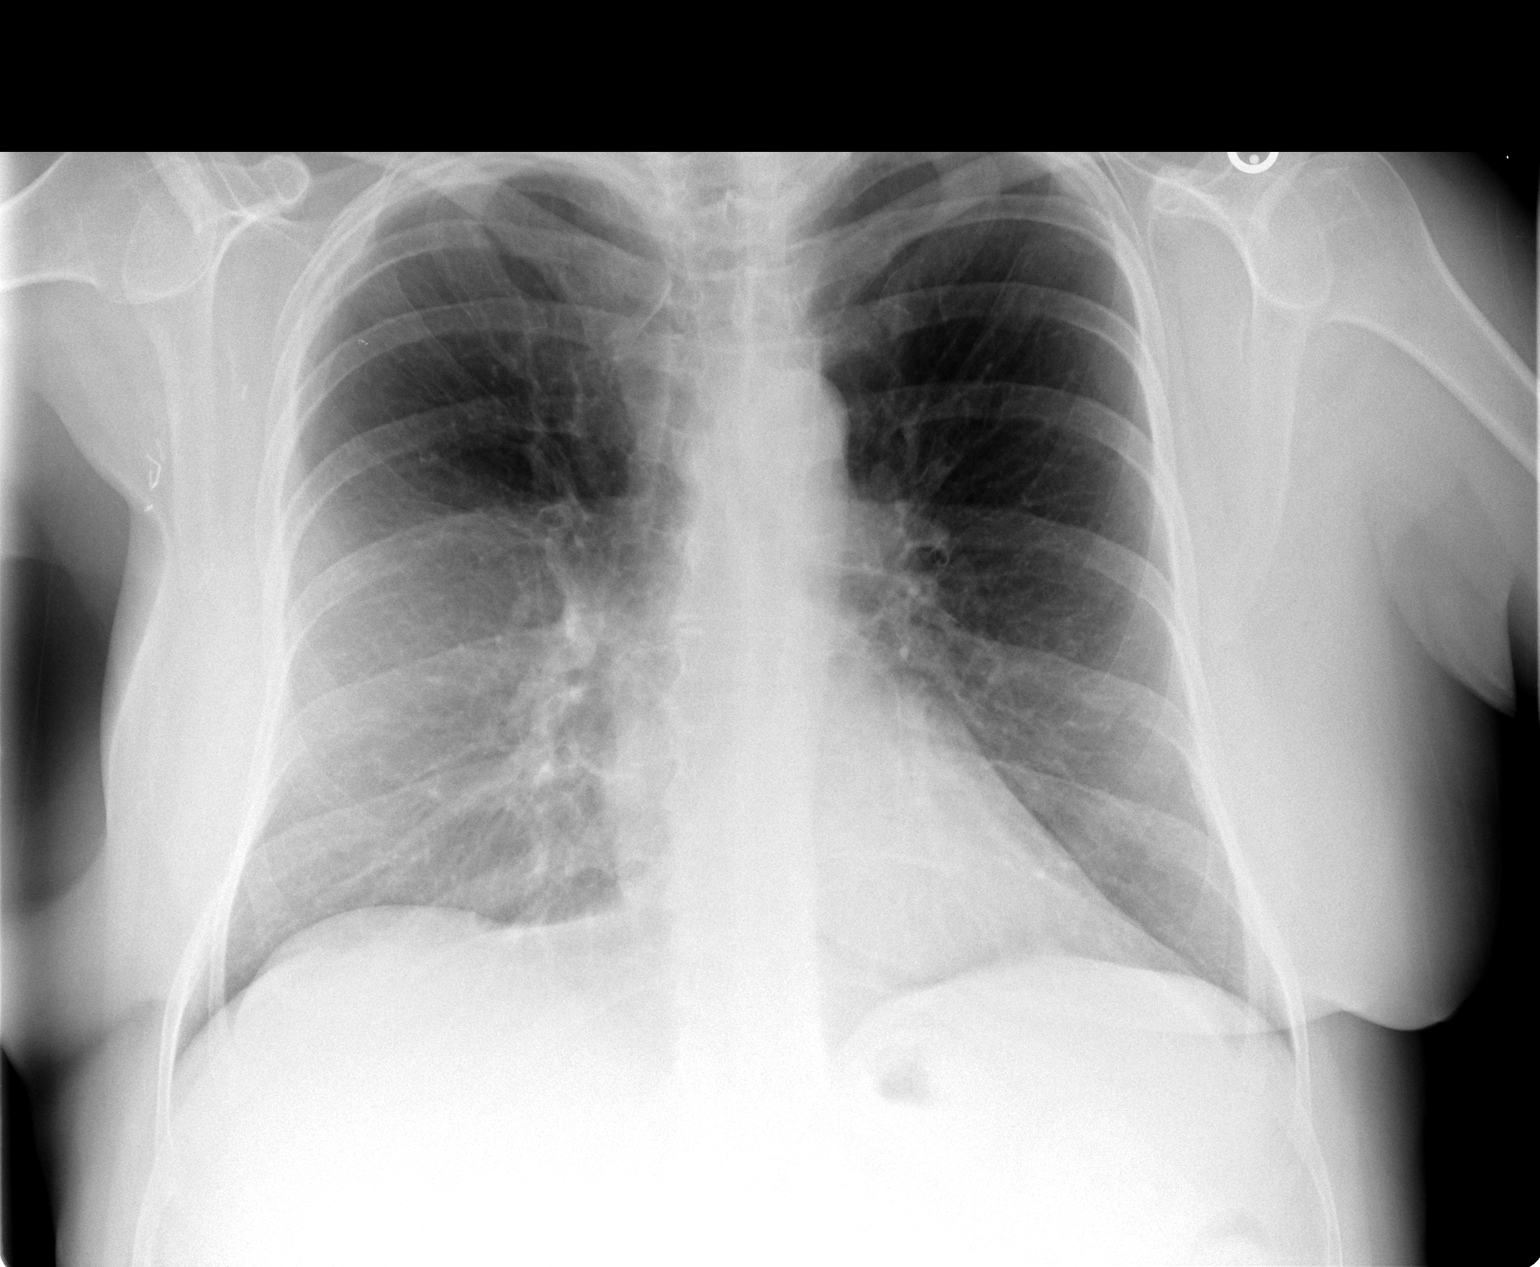

[view not recorded (2 of 2)]
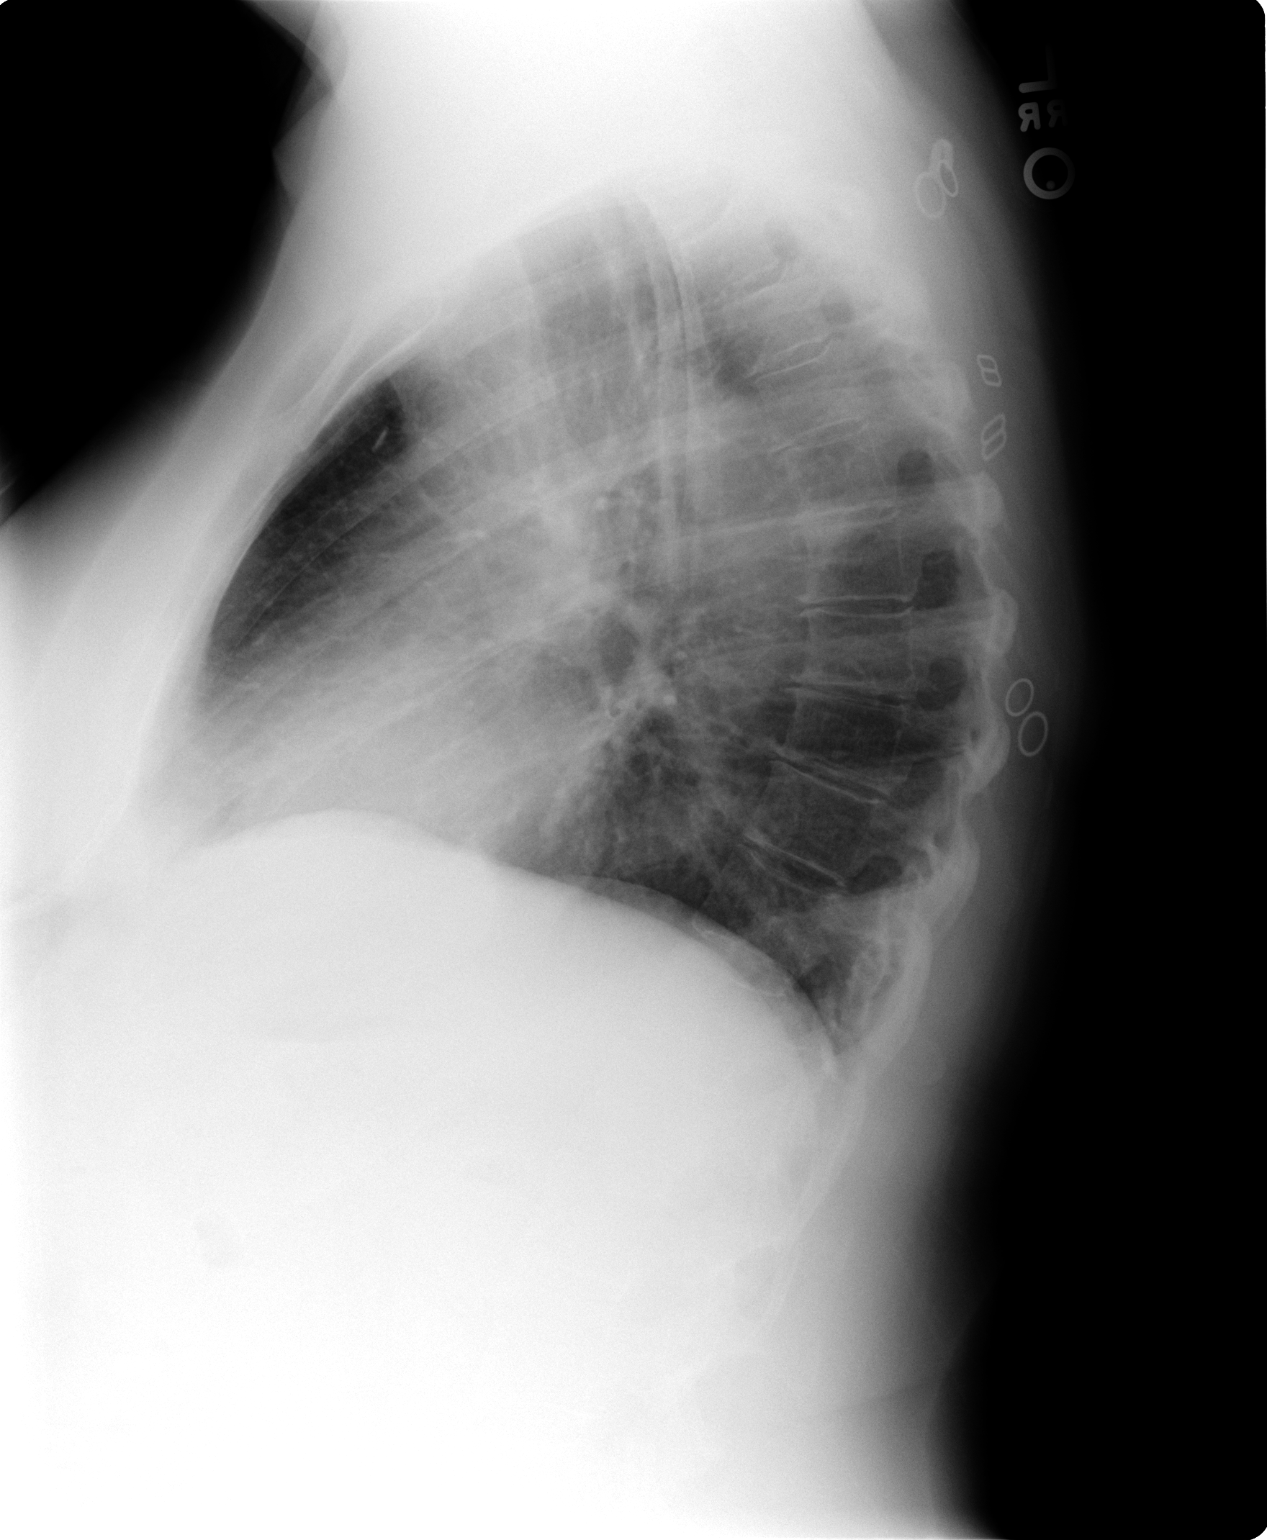

[2 of 2 positions shown; findings below may reference images not displayed]

FINDINGS: Right mastectomy and axillary node dissection. Midline trachea.
Normal heart size and mediastinal contours. No pleural effusion or
pneumothorax. Clear lungs.
IMPRESSION: No acute cardiopulmonary disease.

## 2014-02-06 MED ORDER — MELOXICAM 7.5 MG PO TABS: 7.5000 mg | ORAL_TABLET | Freq: Every day | ORAL | Status: DC

## 2014-02-06 NOTE — ED Notes (Signed)
Brittish was walking up stairs yesterday and felt a sharp pain in her right knee. This morning when she stepped out of the bed her right knee gave away and she fell. Her pain level is an 8/10 on the pain scale.

## 2014-02-06 NOTE — Discharge Instructions (Signed)
Apply ice pack for 30 minutes every 1 to 2 hours today and tomorrow.  Elevate.  Use crutches for 3 to 5 days.  Wear Ace wrap until swelling decreases.  Begin range of motion and stretching exercises in about 5 days as per instruction sheet.  Change bandage on right knee abrasion daily and apply Bacitracin ointment.

## 2014-02-06 NOTE — ED Provider Notes (Signed)
CSN: [id number]     Arrival date & time 02/06/14  1254 History   First MD Initiated Contact with Patient 02/06/14 1356     Chief Complaint  Patient presents with  . Knee Pain     HPI Comments: Patient reports that she was walking up stairs yesterday when her right knee suddenly gave way and she fell, abrading her right anterior knee.  She states that her right knee has been swollen over the past several days, and her right knee continues to feel as if it may give way.  She and her husband had been out of town, walking much more than usual although she recalls no prior injury to her right knee.  Patient is a 63 y.o. female presenting with knee pain. The history is provided by the patient and the spouse.  Knee Pain Location:  Knee Time since incident:  1 day Injury: yes   Mechanism of injury: fall   Fall:    Fall occurred: onto stairs.   Point of impact: right knee. Pain details:    Quality:  Aching   Radiates to:  Does not radiate   Severity:  Moderate   Onset quality:  Sudden   Duration:  1 day   Timing:  Constant   Progression:  Worsening Chronicity:  New Prior injury to area:  No Worsened by:  Activity and bearing weight Ineffective treatments:  None tried Associated symptoms: decreased ROM, stiffness and swelling   Associated symptoms: no back pain, no fever, no muscle weakness, no numbness and no tingling   Risk factors: obesity     Past Medical History  Diagnosis Date  . Asthma   . Anxiety   . Depression   . Allergy   . Breast cancer 2009  . Postmenopausal   . Thyroid disease    Past Surgical History  Procedure Laterality Date  . Mastectomy  2009    [facility]  . Breast reconstruction  06/2008  . Adenoidectomy    . Tonsillectomy     Family History  Problem Relation Age of Onset  . Stroke Mother   . Cancer Mother     colon?  [name] Hypertension Father   . Hyperlipidemia Father    History  Substance Use Topics  . Smoking status: Former Smoker    Quit date:  06/25/2011  . Smokeless tobacco: Not on file  . Alcohol Use: No   OB History   Grav Para Term Preterm Abortions TAB SAB Ect Mult Living                 Review of Systems  Constitutional: Negative for fever.  Musculoskeletal: Positive for stiffness. Negative for back pain.  All other systems reviewed and are negative.   Allergies  Macrodantin; Red dye; Sulfa antibiotics; and Penicillins  Home Medications   Prior to Admission medications   Medication Sig Start Date End Date Taking? Authorizing Provider  albuterol (VENTOLIN HFA) 108 (90 BASE) MCG/ACT inhaler Inhale 1-2 puffs into the lungs every 4 (four) hours as needed. 11/17/13   Hali Marry, MD  amitriptyline (ELAVIL) 100 MG tablet Take one tab at bedtime. 08/03/13   [name] L Breeback, PA-C  aspirin 81 MG tablet Take 81 mg by mouth daily.    Historical Provider, MD  citalopram (CELEXA) 40 MG tablet Take 1 tablet (40 mg total) by mouth daily. 08/20/13   [name] L Breeback, PA-C  clonazePAM (KLONOPIN) 1 MG tablet Take 1 tablet (1 mg total) by mouth daily  as needed. 11/27/12   [name] L Breeback, PA-C  levothyroxine (SYNTHROID, LEVOTHROID) 112 MCG tablet TAKE ONE TABLET BY MOUTH ONCE DAILY 01/07/14   [name] L Breeback, PA-C  meloxicam (MOBIC) 7.5 MG tablet Take 1 tablet (7.5 mg total) by mouth daily. Take with breakfast. 02/06/14   Kandra Nicolas, MD  oxybutynin (DITROPAN) 5 MG tablet TAKE ONE TABLET BY MOUTH TWICE DAILY 08/20/13   [name] L Breeback, PA-C   BP 122/75  Pulse 71  Temp(Src) 98.2 F (36.8 C) (Oral)  Ht 5' 3.5" (1.613 m)  Wt 182 lb (82.555 kg)  BMI 31.73 kg/m2  SpO2 97%  LMP 06/24/2000 Physical Exam  Nursing note and vitals reviewed. Constitutional: She is oriented to person, place, and time. She appears well-developed and well-nourished. No distress.  Patient is obese (BMI 31.7)  HENT:  Head: Atraumatic.  Eyes: Conjunctivae are normal. Pupils are equal, round, and reactive to light.  Musculoskeletal:       Right knee:  She exhibits decreased range of motion and swelling. She exhibits no effusion, no ecchymosis, no deformity, no laceration, no erythema, normal alignment, no LCL laxity, normal patellar mobility, no bony tenderness, normal meniscus and no MCL laxity. Tenderness found. Patellar tendon tenderness noted. No medial joint line, no lateral joint line, no MCL and no LCL tenderness noted.       Legs: Right knee has mild decrease in range of motion.  There is swelling over the pre-patellar bursa.  There is tenderness over the patellar tendon.  Pain is elicited by pressure over the right patella during resisted extension of the knee. There is a 2cm by 5cm simple superficial abrasion below the right patella as noted on diagram without evidence infection.  Neurological: She is alert and oriented to person, place, and time.  Skin: Skin is warm and dry. No rash noted.    ED Course  Procedures  none    Imaging Review Dg Knee Complete 4 Views Right  02/06/2014   CLINICAL DATA:  Acute knee pain. We will not support weight. No known injury.  EXAM: RIGHT KNEE - COMPLETE 4+ VIEW  COMPARISON:  None.  FINDINGS: There is no evidence of fracture, dislocation, or joint effusion. There is no evidence of arthropathy or other focal bone abnormality. Soft tissues are unremarkable.  IMPRESSION: Negative.   Electronically Signed   By: Lajean Manes M.D.   On: 02/06/2014 13:49     MDM   1. Patellofemoral joint pain, right   2. Abrasion of right knee, initial encounter    Abrasion cleansed and bandage applied with Bacitracin. Ace wrap applied. Rx for Mobic.  Dispensed crutches. Apply ice pack for 30 minutes every 1 to 2 hours today and tomorrow.  Elevate.  Use crutches for 3 to 5 days.  Wear Ace wrap until swelling decreases.  Begin range of motion and stretching exercises in about 5 days as per instruction sheet.  Change bandage on right knee abrasion daily and apply Bacitracin ointment. Followup with Dr. Aundria Mems ([facility]) if not improving about two weeks.     Kandra Nicolas, MD 02/10/14 1538

## 2014-02-08 ENCOUNTER — Telehealth: Payer: Self-pay | Admitting: *Deleted

## 2014-02-08 NOTE — ED Notes (Unsigned)
pt called reports that her knee pain is no better. she would like her work note extended. I wrote a work note to return on 02/10/14. she will try to sch appt with Dr T this wk. If she is unable to get appt she will call back here on 01/2014 to see if Dr Assunta Found can extend her note again.

## 2014-02-10 ENCOUNTER — Ambulatory Visit (INDEPENDENT_AMBULATORY_CARE_PROVIDER_SITE_OTHER): Payer: BC Managed Care – PPO | Admitting: Sports Medicine

## 2014-02-10 ENCOUNTER — Other Ambulatory Visit: Payer: Self-pay | Admitting: Physician Assistant

## 2014-02-10 ENCOUNTER — Encounter: Payer: Self-pay | Admitting: Sports Medicine

## 2014-02-10 VITALS — BP 144/71 | HR 80 | Ht 63.0 in | Wt 177.0 lb

## 2014-02-10 DIAGNOSIS — M2240 Chondromalacia patellae, unspecified knee: Secondary | ICD-10-CM

## 2014-02-10 DIAGNOSIS — M2241 Chondromalacia patellae, right knee: Secondary | ICD-10-CM | POA: Insufficient documentation

## 2014-02-10 MED ORDER — MELOXICAM 15 MG PO TABS: ORAL_TABLET | ORAL | Status: DC

## 2014-02-10 MED ORDER — MUPIROCIN 2 % EX OINT: TOPICAL_OINTMENT | CUTANEOUS | Status: DC

## 2014-02-10 NOTE — Assessment & Plan Note (Addendum)
With a slight effusion. Injection, PT, Mobic.  Patellar stabilizing brace.  Return in 4 weeks. She also has an abrasion, calling in topical mupirocin.

## 2014-02-10 NOTE — Progress Notes (Signed)
   Subjective:    I'm seeing this patient as a consultation for:  Dr. Madilyn Fireman  CC: Right knee pain  HPI: This is a very pleasant 63 year old female who comes in with anterior knee pain for some time now, moderate, persistent without radiation, no mechanical symptoms. No trauma.  Past medical history, Surgical history, Family history not pertinant except as noted below, Social history, Allergies, and medications have been entered into the medical record, reviewed, and no changes needed.   Review of Systems: No headache, visual changes, nausea, vomiting, diarrhea, constipation, dizziness, abdominal pain, skin rash, fevers, chills, night sweats, weight loss, swollen lymph nodes, body aches, joint swelling, muscle aches, chest pain, shortness of breath, mood changes, visual or auditory hallucinations.   Objective:   General: Well Developed, well nourished, and in no acute distress.  Neuro/Psych: Alert and oriented x3, extra-ocular muscles intact, able to move all 4 extremities, sensation grossly intact. Skin: Warm and dry, no rashes noted.  Respiratory: Not using accessory muscles, speaking in full sentences, trachea midline.  Cardiovascular: Pulses palpable, no extremity edema. Abdomen: Does not appear distended. Right Knee: Slight effusion with tenderness to palpation of the medial and lateral patellar facets ROM normal in flexion and extension and lower leg rotation. Ligaments with solid consistent endpoints including ACL, PCL, LCL, MCL. Negative Mcmurray's and provocative meniscal tests. Non painful patellar compression. Patellar and quadriceps tendons unremarkable. Hamstring and quadriceps strength is normal.  Procedure: Real-time Ultrasound Guided Injection of right knee Device: GE Logiq E  Verbal informed consent obtained.  Time-out conducted.  Noted no overlying erythema, induration, or other signs of local infection.  Skin prepped in a sterile fashion.  Local anesthesia:  Topical Ethyl chloride.  With sterile technique and under real time ultrasound guidance:  2 cc kenalog 40, 4 cc lidocaine injected easily. Completed without difficulty  Pain immediately resolved suggesting accurate placement of the medication.  Advised to call if fevers/chills, erythema, induration, drainage, or persistent bleeding.  Images permanently stored and available for review in the ultrasound unit.  Impression: Technically successful ultrasound guided injection.  Impression and Recommendations:   This case required medical decision making of moderate complexity.

## 2014-02-23 ENCOUNTER — Ambulatory Visit (INDEPENDENT_AMBULATORY_CARE_PROVIDER_SITE_OTHER): Payer: BC Managed Care – PPO | Admitting: Physical Therapy

## 2014-02-23 DIAGNOSIS — M2240 Chondromalacia patellae, unspecified knee: Secondary | ICD-10-CM

## 2014-02-23 DIAGNOSIS — M25569 Pain in unspecified knee: Secondary | ICD-10-CM

## 2014-02-23 DIAGNOSIS — M6281 Muscle weakness (generalized): Secondary | ICD-10-CM

## 2014-03-02 ENCOUNTER — Encounter (INDEPENDENT_AMBULATORY_CARE_PROVIDER_SITE_OTHER): Payer: BC Managed Care – PPO | Admitting: Physical Therapy

## 2014-03-02 DIAGNOSIS — M2240 Chondromalacia patellae, unspecified knee: Secondary | ICD-10-CM

## 2014-03-02 DIAGNOSIS — M25569 Pain in unspecified knee: Secondary | ICD-10-CM

## 2014-03-02 DIAGNOSIS — M6281 Muscle weakness (generalized): Secondary | ICD-10-CM

## 2014-03-07 ENCOUNTER — Telehealth: Payer: Self-pay | Admitting: Family Medicine

## 2014-03-07 DIAGNOSIS — E039 Hypothyroidism, unspecified: Secondary | ICD-10-CM

## 2014-03-07 NOTE — Telephone Encounter (Signed)
Yes, ok for both

## 2014-03-07 NOTE — Telephone Encounter (Signed)
Angelica [name] called.  She is getting ready to lose her insurance on Oct 4th. She wants to know if she can get  labwork done to rechk thyroid level also script written for 90 days if she is going need one.  Thank you.

## 2014-03-07 NOTE — Telephone Encounter (Signed)
Patient advised lab order has been faxed. She will wait for the refill after the results.

## 2014-03-10 ENCOUNTER — Ambulatory Visit: Payer: BC Managed Care – PPO | Admitting: Sports Medicine

## 2014-03-11 ENCOUNTER — Other Ambulatory Visit: Payer: Self-pay | Admitting: Physician Assistant

## 2014-03-12 LAB — TSH: TSH: 0.104 u[IU]/mL — AB (ref 0.350–4.500)

## 2014-03-14 ENCOUNTER — Encounter: Payer: Self-pay | Admitting: Physician Assistant

## 2014-03-14 ENCOUNTER — Ambulatory Visit (INDEPENDENT_AMBULATORY_CARE_PROVIDER_SITE_OTHER): Payer: BC Managed Care – PPO | Admitting: Physician Assistant

## 2014-03-14 VITALS — BP 124/67 | HR 80 | Ht 63.0 in | Wt 179.0 lb

## 2014-03-14 DIAGNOSIS — Z23 Encounter for immunization: Secondary | ICD-10-CM | POA: Diagnosis not present

## 2014-03-14 DIAGNOSIS — G4700 Insomnia, unspecified: Secondary | ICD-10-CM

## 2014-03-14 DIAGNOSIS — F411 Generalized anxiety disorder: Secondary | ICD-10-CM | POA: Diagnosis not present

## 2014-03-14 DIAGNOSIS — E039 Hypothyroidism, unspecified: Secondary | ICD-10-CM

## 2014-03-14 DIAGNOSIS — F39 Unspecified mood [affective] disorder: Secondary | ICD-10-CM

## 2014-03-14 MED ORDER — LEVOTHYROXINE SODIUM 112 MCG PO TABS: ORAL_TABLET | ORAL | Status: DC

## 2014-03-14 MED ORDER — AMITRIPTYLINE HCL 100 MG PO TABS: ORAL_TABLET | ORAL | Status: DC

## 2014-03-14 NOTE — Patient Instructions (Addendum)
Take one/half tablet once a week and one full tablet other 6 days. Recheck in 6 weeks.  Try adding melatonin 5mg  can increase to 10mg  at bedtime.

## 2014-03-15 ENCOUNTER — Other Ambulatory Visit: Payer: Self-pay | Admitting: *Deleted

## 2014-03-15 DIAGNOSIS — E039 Hypothyroidism, unspecified: Secondary | ICD-10-CM

## 2014-03-15 DIAGNOSIS — G4700 Insomnia, unspecified: Secondary | ICD-10-CM | POA: Insufficient documentation

## 2014-03-15 NOTE — Progress Notes (Signed)
   Subjective:    Patient ID: Angelica [name], female    DOB: 05-Feb-1951, 63 y.o.   MRN: [id number]  HPI Pt presents to the clinic for follow up. She is doing much better with anxiety and depression. She is working at [employer] again. She is being seen by psych but not going to be able to afford much longer. Would like for our office to take over.   Just had tsh drawn. Would like to go over results.    Review of Systems  All other systems reviewed and are negative.      Objective:   Physical Exam  Constitutional: She is oriented to person, place, and time. She appears well-developed and well-nourished.  HENT:  Head: Normocephalic and atraumatic.  Neck: Normal range of motion. Neck supple.  Cardiovascular: Normal rate, regular rhythm and normal heart sounds.   Pulmonary/Chest: Effort normal and breath sounds normal. She has no wheezes.  Neurological: She is alert and oriented to person, place, and time.  Skin: Skin is dry.  Psychiatric: She has a normal mood and affect. Her behavior is normal.          Assessment & Plan:  Hypothyroid- TSH was low. Medication decreased to one tablet for 6 days and 1/2 tablet for one day. Follow up in 6 weeks.   Anxiety and depression- currently being managed by pysch. Does think she is going to be able to afford it. Would like for our office to manage. I discussed as long as stay stable we can but may at some point need to be established with psych again. Encouraged daily exercise for at least 30 minutes.   Insomnia- add melatonin up to 10mg   to elavil. Refilled elavil. Discussed relaxation techniques.   Flu shot given without complication.

## 2014-03-16 ENCOUNTER — Encounter: Payer: BC Managed Care – PPO | Admitting: Physical Therapy

## 2014-03-21 ENCOUNTER — Encounter (INDEPENDENT_AMBULATORY_CARE_PROVIDER_SITE_OTHER): Payer: BC Managed Care – PPO | Admitting: Physical Therapy

## 2014-03-21 ENCOUNTER — Encounter: Payer: Self-pay | Admitting: Sports Medicine

## 2014-03-21 ENCOUNTER — Ambulatory Visit (INDEPENDENT_AMBULATORY_CARE_PROVIDER_SITE_OTHER): Payer: BC Managed Care – PPO | Admitting: Sports Medicine

## 2014-03-21 VITALS — BP 128/77 | HR 77 | Ht 63.5 in | Wt 178.0 lb

## 2014-03-21 DIAGNOSIS — M25569 Pain in unspecified knee: Secondary | ICD-10-CM

## 2014-03-21 DIAGNOSIS — M2240 Chondromalacia patellae, unspecified knee: Secondary | ICD-10-CM

## 2014-03-21 DIAGNOSIS — M6281 Muscle weakness (generalized): Secondary | ICD-10-CM

## 2014-03-21 DIAGNOSIS — M2241 Chondromalacia patellae, right knee: Secondary | ICD-10-CM

## 2014-03-21 NOTE — Progress Notes (Signed)
  Subjective:    CC: Followup  HPI: Right patellofemoral pain syndrome: Completely resolved now with physical therapy and injection.  Past medical history, Surgical history, Family history not pertinant except as noted below, Social history, Allergies, and medications have been entered into the medical record, reviewed, and no changes needed.   Review of Systems: No fevers, chills, night sweats, weight loss, chest pain, or shortness of breath.   Objective:    General: Well Developed, well nourished, and in no acute distress.  Neuro: Alert and oriented x3, extra-ocular muscles intact, sensation grossly intact.  HEENT: Normocephalic, atraumatic, pupils equal round reactive to light, neck supple, no masses, no lymphadenopathy, thyroid nonpalpable.  Skin: Warm and dry, no rashes. Cardiac: Regular rate and rhythm, no murmurs rubs or gallops, no lower extremity edema.  Respiratory: Clear to auscultation bilaterally. Not using accessory muscles, speaking in full sentences. Right Knee: Normal to inspection with no erythema or effusion or obvious bony abnormalities. Palpation normal with no warmth or joint line tenderness or patellar tenderness or condyle tenderness. ROM normal in flexion and extension and lower leg rotation. Ligaments with solid consistent endpoints including ACL, PCL, LCL, MCL. Negative Mcmurray's and provocative meniscal tests. Non painful patellar compression. Patellar and quadriceps tendons unremarkable. Hamstring and quadriceps strength is normal.  Impression and Recommendations:

## 2014-03-21 NOTE — Assessment & Plan Note (Signed)
Resolved now after physical therapy and injection. Return as needed. Continue home rehabilitation exercises.

## 2014-04-28 ENCOUNTER — Other Ambulatory Visit: Payer: Self-pay | Admitting: Physician Assistant

## 2014-06-13 ENCOUNTER — Ambulatory Visit (INDEPENDENT_AMBULATORY_CARE_PROVIDER_SITE_OTHER): Payer: BC Managed Care – PPO | Admitting: Physician Assistant

## 2014-06-13 ENCOUNTER — Encounter: Payer: Self-pay | Admitting: Physician Assistant

## 2014-06-13 VITALS — BP 142/72 | HR 64 | Temp 98.2°F | Ht 63.0 in | Wt 172.0 lb

## 2014-06-13 DIAGNOSIS — R040 Epistaxis: Secondary | ICD-10-CM | POA: Diagnosis not present

## 2014-06-13 DIAGNOSIS — E039 Hypothyroidism, unspecified: Secondary | ICD-10-CM | POA: Diagnosis not present

## 2014-06-13 DIAGNOSIS — G4700 Insomnia, unspecified: Secondary | ICD-10-CM

## 2014-06-13 DIAGNOSIS — K589 Irritable bowel syndrome without diarrhea: Secondary | ICD-10-CM

## 2014-06-13 MED ORDER — MIRABEGRON ER 25 MG PO TB24: 25.0000 mg | ORAL_TABLET | Freq: Every day | ORAL | Status: DC

## 2014-06-13 MED ORDER — CITALOPRAM HYDROBROMIDE 40 MG PO TABS: 40.0000 mg | ORAL_TABLET | Freq: Every day | ORAL | Status: DC

## 2014-06-13 MED ORDER — DICYCLOMINE HCL 10 MG PO CAPS
10.0000 mg | ORAL_CAPSULE | Freq: Three times a day (TID) | ORAL | Status: DC
Start: 2014-06-13 — End: 2014-08-22

## 2014-06-13 NOTE — Progress Notes (Signed)
   Subjective:    Patient ID: Angelica [name], female    DOB: [birth year], 63 y.o.   MRN: [id number]  HPI  Pt presents to the clinic needing refills and to discuss multiple concerns.   She is most concerned with ongoing diarrhea and loose stools. Had for many years just never discussed. Every time she eats but worse with certain foods she feels gassy and has mucusy stools. Admits to abdominal cramping and gas but no pain. Worse with salad. No nausea or vomiting. No acid reflux or heartburn. Not tried anything to make better.   She is having more nosebleeds about one a week. Controlled easily. Concerned of what could be the cause.   Having problems sleeping. She was previously given a medication but unaware of name that helped and would like refill.   Needs refill or thyroid medication. No known problems or concerns.   Review of Systems  All other systems reviewed and are negative.      Objective:   Physical Exam  Constitutional: She is oriented to person, place, and time. She appears well-developed and well-nourished.  HENT:  Head: Normocephalic and atraumatic.  Nose: Nose normal.  Cardiovascular: Normal rate, regular rhythm and normal heart sounds.   Pulmonary/Chest: Effort normal and breath sounds normal. She has no wheezes.  Abdominal: Soft. Bowel sounds are normal. She exhibits no distension and no mass. There is no tenderness. There is no rebound and no guarding.  Neurological: She is alert and oriented to person, place, and time.  Skin: Skin is dry.  Psychiatric: She has a normal mood and affect. Her behavior is normal.          Assessment & Plan:   IBS- seems predominantly diarrhea. Discussed new medication Viberzi but concerned about cost. Never tried bentyl. Will start with bentyl twice a day. Gave IBS HO. Discussed food diary to identify triggers. Encouraged pt to start probiotic.   Frequent nosebleeds- likely due to dry weather. She is oxybutin twice a day. Could be  drying her out a lot. Try myrbetriq 25mg  daily. Samples given. Coupon card for one month free and rx printed could help with some dryness. Follow up in 2 months.   Hypothyroidism- will recheck thyroid and adjust medications accordingly.   Insomnia- waiting for pt to call back with recent medication tried and would like refill since really helped with sleep.

## 2014-06-13 NOTE — Patient Instructions (Addendum)
Probiotic- align   Diet and Irritable Bowel Syndrome  No cure has been found for irritable bowel syndrome (IBS). Many options are available to treat the symptoms. Your caregiver will give you the best treatments available for your symptoms. He or she will also encourage you to manage stress and to make changes to your diet. You need to work with your caregiver and Registered Dietician to find the best combination of medicine, diet, counseling, and support to control your symptoms. The following are some diet suggestions. FOODS THAT MAKE IBS WORSE  Fatty foods, such as [place] fries.  Milk products, such as cheese or ice cream.  Chocolate.  Alcohol.  Caffeine (found in coffee and some sodas).  Carbonated drinks, such as soda. If certain foods cause symptoms, you should eat less of them or stop eating them. FOOD JOURNAL   Keep a journal of the foods that seem to cause distress. Write down:  What you are eating during the day and when.  What problems you are having after eating.  When the symptoms occur in relation to your meals.  What foods always make you feel badly.  Take your notes with you to your caregiver to see if you should stop eating certain foods. FOODS THAT MAKE IBS BETTER Fiber reduces IBS symptoms, especially constipation, because it makes stools soft, bulky, and easier to pass. Fiber is found in bran, bread, cereal, beans, fruit, and vegetables. Examples of foods with fiber include:  Apples.  Peaches.  Pears.  Berries.  Figs.  Broccoli, raw.  Cabbage.  Carrots.  Raw peas.  Kidney beans.  Lima beans.  Whole-grain bread.  Whole-grain cereal. Add foods with fiber to your diet a little at a time. This will let your body get used to them. Too much fiber at once might cause gas and swelling of your abdomen. This can trigger symptoms in a person with IBS. Caregivers usually recommend a diet with enough fiber to produce soft, painless bowel movements.  High fiber diets may cause gas and bloating. However, these symptoms often go away within a few weeks, as your body adjusts. In many cases, dietary fiber may lessen IBS symptoms, particularly constipation. However, it may not help pain or diarrhea. High fiber diets keep the colon mildly enlarged (distended) with the added fiber. This may help prevent spasms in the colon. Some forms of fiber also keep water in the stool, thereby preventing hard stools that are difficult to pass.  Besides telling you to eat more foods with fiber, your caregiver may also tell you to get more fiber by taking a fiber pill or drinking water mixed with a special high fiber powder. An example of this is a natural fiber laxative containing psyllium seed.  TIPS  Large meals can cause cramping and diarrhea in people with IBS. If this happens to you, try eating 4 or 5 small meals a day, or try eating less at each of your usual 3 meals. It may also help if your meals are low in fat and high in carbohydrates. Examples of carbohydrates are pasta, rice, whole-grain breads and cereals, fruits, and vegetables.  If dairy products cause your symptoms to flare up, you can try eating less of those foods. You might be able to handle yogurt better than other dairy products, because it contains bacteria that helps with digestion. Dairy products are an important source of calcium and other nutrients. If you need to avoid dairy products, be sure to talk with a Registered Dietitian  about getting these nutrients through other food sources.  Drink enough water and fluids to keep your urine clear or pale yellow. This is important, especially if you have diarrhea. FOR MORE INFORMATION  International Foundation for Functional Gastrointestinal Disorders: www.iffgd.org  National Digestive Diseases Information Clearinghouse: digestive.AmenCredit.is Document Released: 08/31/2003 Document Revised: 09/02/2011 Document Reviewed: 09/10/2013 [facility] Patient  Information 2015 [place], [place]. This information is not intended to replace advice given to you by your health care provider. Make sure you discuss any questions you have with your health care provider.   Nosebleed Nosebleeds can be caused by many conditions, including trauma, infections, polyps, foreign bodies, dry mucous membranes or climate, medicines, and air conditioning. Most nosebleeds occur in the front of the nose. Because of this location, most nosebleeds can be controlled by pinching the nostrils gently and continuously for at least 10 to 20 minutes. The long, continuous pressure allows enough time for the blood to clot. If pressure is released during that 10 to 20 minute time period, the process may have to be started again. The nosebleed may stop by itself or quit with pressure, or it may need concentrated heating (cautery) or pressure from packing. HOME CARE INSTRUCTIONS   If your nose was packed, try to maintain the pack inside until your health care provider removes it. If a gauze pack was used and it starts to fall out, gently replace it or cut the end off. Do not cut if a balloon catheter was used to pack the nose. Otherwise, do not remove unless instructed.  Avoid blowing your nose for 12 hours after treatment. This could dislodge the pack or clot and start the bleeding again.  If the bleeding starts again, sit up and bend forward, gently pinching the front half of your nose continuously for 20 minutes.  If bleeding was caused by dry mucous membranes, use over-the-counter saline nasal spray or gel. This will keep the mucous membranes moist and allow them to heal. If you must use a lubricant, choose the water-soluble variety. Use it only sparingly and not within several hours of lying down.  Do not use petroleum jelly or mineral oil, as these may drip into the lungs and cause serious problems.  Maintain humidity in your home by using less air conditioning or by using a  humidifier.  Do not use aspirin or medicines which make bleeding more likely. Your health care provider can give you recommendations on this.  Resume normal activities as you are able, but try to avoid straining, lifting, or bending at the waist for several days.  If the nosebleeds become recurrent and the cause is unknown, your health care provider may suggest laboratory tests. SEEK MEDICAL CARE IF: You have a fever. SEEK IMMEDIATE MEDICAL CARE IF:   Bleeding recurs and cannot be controlled.  There is unusual bleeding from or bruising on other parts of the body.  Nosebleeds continue.  There is any worsening of the condition which originally brought you in.  You become light-headed, feel faint, become sweaty, or vomit blood. MAKE SURE YOU:   Understand these instructions.  Will watch your condition.  Will get help right away if you are not doing well or get worse. Document Released: 03/20/2005 Document Revised: 10/25/2013 Document Reviewed: 05/11/2009 [facility] Patient Information 2015 [place], [place]. This information is not intended to replace advice given to you by your health care provider. Make sure you discuss any questions you have with your health care provider.

## 2014-06-15 MED ORDER — TEMAZEPAM 15 MG PO CAPS: 15.0000 mg | ORAL_CAPSULE | Freq: Every evening | ORAL | Status: DC | PRN

## 2014-06-20 ENCOUNTER — Other Ambulatory Visit: Payer: Self-pay | Admitting: *Deleted

## 2014-07-18 ENCOUNTER — Other Ambulatory Visit: Payer: Self-pay

## 2014-07-18 DIAGNOSIS — Z1231 Encounter for screening mammogram for malignant neoplasm of breast: Secondary | ICD-10-CM

## 2014-07-18 DIAGNOSIS — Z9011 Acquired absence of right breast and nipple: Secondary | ICD-10-CM

## 2014-07-21 ENCOUNTER — Ambulatory Visit
Admission: RE | Admit: 2014-07-21 | Discharge: 2014-07-21 | Disposition: A | Discharge status: Home / Self Care | Payer: BLUE CROSS/BLUE SHIELD | Source: Ambulatory Visit

## 2014-07-21 DIAGNOSIS — Z9011 Acquired absence of right breast and nipple: Secondary | ICD-10-CM

## 2014-07-21 DIAGNOSIS — Z1231 Encounter for screening mammogram for malignant neoplasm of breast: Secondary | ICD-10-CM

## 2014-07-22 ENCOUNTER — Other Ambulatory Visit: Payer: Self-pay | Admitting: *Deleted

## 2014-07-22 ENCOUNTER — Other Ambulatory Visit: Payer: Self-pay | Admitting: Physician Assistant

## 2014-07-22 MED ORDER — LEVOTHYROXINE SODIUM 112 MCG PO TABS: ORAL_TABLET | ORAL | Status: DC

## 2014-07-26 ENCOUNTER — Other Ambulatory Visit: Payer: Self-pay | Admitting: *Deleted

## 2014-07-26 LAB — TSH: TSH: 0.222 u[IU]/mL — AB (ref 0.350–4.500)

## 2014-07-26 LAB — T4, FREE: FREE T4: 1.39 ng/dL (ref 0.80–1.80)

## 2014-07-26 MED ORDER — LEVOTHYROXINE SODIUM 112 MCG PO TABS: ORAL_TABLET | ORAL | Status: DC

## 2014-08-12 ENCOUNTER — Encounter: Payer: Self-pay | Admitting: Physician Assistant

## 2014-08-12 ENCOUNTER — Ambulatory Visit (INDEPENDENT_AMBULATORY_CARE_PROVIDER_SITE_OTHER): Payer: BLUE CROSS/BLUE SHIELD | Admitting: Physician Assistant

## 2014-08-12 VITALS — BP 148/73 | HR 76 | Temp 97.8°F | Ht 63.0 in | Wt 171.0 lb

## 2014-08-12 DIAGNOSIS — A499 Bacterial infection, unspecified: Secondary | ICD-10-CM | POA: Diagnosis not present

## 2014-08-12 DIAGNOSIS — J329 Chronic sinusitis, unspecified: Secondary | ICD-10-CM | POA: Diagnosis not present

## 2014-08-12 DIAGNOSIS — B9689 Other specified bacterial agents as the cause of diseases classified elsewhere: Secondary | ICD-10-CM

## 2014-08-12 MED ORDER — AZITHROMYCIN 250 MG PO TABS: ORAL_TABLET | ORAL | Status: DC

## 2014-08-12 NOTE — Patient Instructions (Signed)

## 2014-08-12 NOTE — Progress Notes (Signed)
   Subjective:    Patient ID: Angelica [name], female    DOB: [birth year], 64 y.o.   MRN: [id number]  HPI  Patient is a 64 year old female who presents to the clinic with 6 days of sinus pressure, nasal congestion, and throat drainage. Tried allegra and benadryl. Headache and pressure with any movement of head. No fever. Mild ST. No ear pain. No cough, wheezing or SOB.     Review of Systems  All other systems reviewed and are negative.      Objective:   Physical Exam  Constitutional: She is oriented to person, place, and time. She appears well-developed and well-nourished.  HENT:  Head: Normocephalic and atraumatic.  Right Ear: External ear normal.  Left Ear: External ear normal.  Nose: Nose normal.  Mouth/Throat: Oropharynx is clear and moist.  TM's clear bilaterally.  Nasal turbinates red and swollen.  Pressure over maxillary sinuses to palpation.   Eyes: Conjunctivae are normal. Right eye exhibits no discharge. Left eye exhibits no discharge.  Neck: Normal range of motion. Neck supple.  Cardiovascular: Normal rate, regular rhythm and normal heart sounds.   Pulmonary/Chest: Effort normal and breath sounds normal. She has no wheezes.  Lymphadenopathy:    She has no cervical adenopathy.  Neurological: She is alert and oriented to person, place, and time.  Skin: Skin is dry.  Psychiatric: She has a normal mood and affect. Her behavior is normal.          Assessment & Plan:  Acute maxillary sinusitis- treated with zpak upon patient's request she stated has worked in the past. Encouraged flonase 2 sprays each nostril. Discussed to decrease celexa in half while on zpak go back to normal dose after finishing abx. Discussed signs and symptoms of QT prolongation. Stop zpak with any side effects. follo wup if not improving. Written out of work for 3 days because if only written out for 1 she gets points.

## 2014-08-22 ENCOUNTER — Other Ambulatory Visit: Payer: Self-pay | Admitting: *Deleted

## 2014-08-22 DIAGNOSIS — K589 Irritable bowel syndrome without diarrhea: Secondary | ICD-10-CM | POA: Insufficient documentation

## 2014-08-22 MED ORDER — DICYCLOMINE HCL 10 MG PO CAPS: 10.0000 mg | ORAL_CAPSULE | Freq: Three times a day (TID) | ORAL | Status: DC

## 2014-09-20 ENCOUNTER — Telehealth: Payer: Self-pay | Admitting: Family Medicine

## 2014-09-20 NOTE — Telephone Encounter (Signed)
Received fax for pa on Ventolin sent through cover my meds and waiting on auth. - CF

## 2014-09-22 ENCOUNTER — Other Ambulatory Visit: Payer: Self-pay | Admitting: Family Medicine

## 2014-09-22 ENCOUNTER — Telehealth: Payer: Self-pay | Admitting: Family Medicine

## 2014-09-22 MED ORDER — ALBUTEROL SULFATE HFA 108 (90 BASE) MCG/ACT IN AERS: 2.0000 | INHALATION_SPRAY | Freq: Four times a day (QID) | RESPIRATORY_TRACT | Status: DC | PRN

## 2014-09-22 NOTE — Telephone Encounter (Signed)
Received fax frrom [employer] and they have denied coverage on Ventolin HFA due to only one alternative being tried. - CF

## 2014-09-22 NOTE — Telephone Encounter (Signed)
Pt called and stated that her insurance will be changing tomorrow and it is going to cost her $100 she said that the pharmacy was going to wait to see what was going to be cheaper.Angelica [name] [place]

## 2014-09-22 NOTE — Telephone Encounter (Signed)
Please call patient and let her know that her insurance will not pay for Ventolin for her albuterol but they will pay for the brand Provera for her albuterol. New prescription sent to her pharmacy.

## 2014-10-17 ENCOUNTER — Other Ambulatory Visit: Payer: Self-pay | Admitting: Physician Assistant

## 2014-10-17 ENCOUNTER — Telehealth: Payer: Self-pay | Admitting: Physician Assistant

## 2014-10-17 MED ORDER — CLONAZEPAM 0.5 MG PO TABS: 0.5000 mg | ORAL_TABLET | Freq: Two times a day (BID) | ORAL | Status: DC | PRN

## 2014-10-17 NOTE — Telephone Encounter (Signed)
Pt calls and needs refill on klonapin only takes as needed. Refilled. Needs follow up for anxiety in June sometime.

## 2014-12-09 ENCOUNTER — Other Ambulatory Visit: Payer: Self-pay | Admitting: Physician Assistant

## 2014-12-15 ENCOUNTER — Telehealth: Payer: Self-pay | Admitting: *Deleted

## 2014-12-15 NOTE — Telephone Encounter (Signed)
Pt notified of all recommendations.  She stated that the bleeding is pretty heavy & it's always the right nostril.  I encouraged her to try the suggestions through the weekend & it there was no noticeable change to call us on Monday.

## 2014-12-15 NOTE — Telephone Encounter (Signed)
I agree, decreased sailing use down to twice a day. Once in the morning and once before bedtime. Also avoid blowing the nose too hard which can break capillaries. May want to try running a humidifier in the bedroom at night to help moisturize the passages. If the bleeding is coming from the same nostril each time then she can always come in and we can take a look to see if we can do some chemical cautery here in the office.

## 2014-12-15 NOTE — Telephone Encounter (Signed)
Pt called this morning stating that she has been getting nosebleeds every day lately.  She was wondering if it could just be this weather or if it could possibly mean that potassium or something in her system is off.  She told me that [name] suggested once that she use a saline nose spray & she's been doing that about 3 times a day.  I told her it could be a combination of the air & maybe using the nasal spray too much but I wanted to ask your opinion & advice. I did not ask her the severity of the bleeds.

## 2015-02-22 ENCOUNTER — Other Ambulatory Visit: Payer: Self-pay

## 2015-02-22 MED ORDER — CITALOPRAM HYDROBROMIDE 40 MG PO TABS: 40.0000 mg | ORAL_TABLET | Freq: Every day | ORAL | Status: DC

## 2015-02-27 ENCOUNTER — Other Ambulatory Visit: Payer: Self-pay | Admitting: Physician Assistant

## 2015-03-08 ENCOUNTER — Ambulatory Visit (INDEPENDENT_AMBULATORY_CARE_PROVIDER_SITE_OTHER): Payer: BLUE CROSS/BLUE SHIELD | Admitting: Physician Assistant

## 2015-03-08 ENCOUNTER — Encounter: Payer: Self-pay | Admitting: Physician Assistant

## 2015-03-08 VITALS — BP 126/65 | HR 75 | Ht 63.0 in | Wt 178.0 lb

## 2015-03-08 DIAGNOSIS — F411 Generalized anxiety disorder: Secondary | ICD-10-CM

## 2015-03-08 DIAGNOSIS — Z0000 Encounter for general adult medical examination without abnormal findings: Secondary | ICD-10-CM

## 2015-03-08 DIAGNOSIS — G4700 Insomnia, unspecified: Secondary | ICD-10-CM | POA: Diagnosis not present

## 2015-03-08 DIAGNOSIS — Z23 Encounter for immunization: Secondary | ICD-10-CM

## 2015-03-08 DIAGNOSIS — F419 Anxiety disorder, unspecified: Secondary | ICD-10-CM

## 2015-03-08 DIAGNOSIS — F329 Major depressive disorder, single episode, unspecified: Secondary | ICD-10-CM

## 2015-03-08 DIAGNOSIS — E039 Hypothyroidism, unspecified: Secondary | ICD-10-CM

## 2015-03-08 DIAGNOSIS — F418 Other specified anxiety disorders: Secondary | ICD-10-CM | POA: Diagnosis not present

## 2015-03-08 DIAGNOSIS — Z1211 Encounter for screening for malignant neoplasm of colon: Secondary | ICD-10-CM

## 2015-03-08 DIAGNOSIS — N3281 Overactive bladder: Secondary | ICD-10-CM | POA: Insufficient documentation

## 2015-03-08 DIAGNOSIS — F32A Depression, unspecified: Secondary | ICD-10-CM

## 2015-03-08 DIAGNOSIS — Z13220 Encounter for screening for lipoid disorders: Secondary | ICD-10-CM

## 2015-03-08 DIAGNOSIS — Z131 Encounter for screening for diabetes mellitus: Secondary | ICD-10-CM

## 2015-03-08 MED ORDER — TEMAZEPAM 30 MG PO CAPS: 30.0000 mg | ORAL_CAPSULE | Freq: Every evening | ORAL | Status: DC | PRN

## 2015-03-08 MED ORDER — ESCITALOPRAM OXALATE 20 MG PO TABS: 20.0000 mg | ORAL_TABLET | Freq: Every day | ORAL | Status: DC

## 2015-03-08 MED ORDER — OXYBUTYNIN CHLORIDE 5 MG PO TABS: 5.0000 mg | ORAL_TABLET | Freq: Two times a day (BID) | ORAL | Status: DC

## 2015-03-08 NOTE — Patient Instructions (Signed)
Melatonin up to 10mg  for sleep.

## 2015-03-08 NOTE — Progress Notes (Signed)
   Subjective:    Patient ID: Angelica [name], female    DOB: Apr 15, 1951, 64 y.o.   MRN: [id number]  HPI  Patient is a 64 year old female who presents to the clinic for medication follow-up.  Her main concern today is her anxiety and depression. She does not feel like Celexa is working as well as it has in the past. At night when she comes home from work she usually takes the [place] and sometimes that does not even help to calm her down. She has trouble going to sleep. She is taking temazepam 15 mg nightly. That helps some. She denies any actual panic attacks. Her job used to be very stressful but it has improved significantly.  Hypothyroidism-we have checked a few times in the last couple months and she has been slightly hyperthyroid. We have made medication adjustments. Needs recheck today.  She was started on smear be treated for overactive bladder symptoms. She was not able to afford the [place] [employer]. She would like to try something else. She is not having any leaking but she goes to the bathroom about once an hour.  Review of Systems  All other systems reviewed and are negative.      Objective:   Physical Exam  Constitutional: She is oriented to person, place, and time. She appears well-developed and well-nourished.  HENT:  Head: Normocephalic and atraumatic.  Neck: Normal range of motion. Neck supple. No thyromegaly present.  Cardiovascular: Normal rate, regular rhythm and normal heart sounds.   Pulmonary/Chest: Effort normal and breath sounds normal.  Neurological: She is alert and oriented to person, place, and time.  Skin: Skin is dry.  Psychiatric: She has a normal mood and affect. Her behavior is normal.          Assessment & Plan:  GAD/depression- pH Q9 was 9 GAD-7 was 12 .will taper off Celexa and try Lexapro. We'll see if she gets any benefit with the change. Refill Klonopin for as needed usage. Will follow-up in 4-6 weeks to see if she is  improving.  Insomnia-temazepam was increased to 30 mg. Patient encouraged to take melatonin up to 10 mg. Discuss good bedtime routine.  OAB- since May recheck was still expensive we'll try oxybutynin 5 mg twice a day we'll assess response in 4-6 weeks.  Hypothyroidism-we'll check TSH today and adjust medications accordingly.  Patient is in need for a complete physical. She will schedule this in the next 6 weeks. I will go ahead and order her colonoscopy which was 2 last year. She was scheduled to have a 3 year follow-up. Fasting labs were ordered today.

## 2015-03-09 ENCOUNTER — Encounter: Payer: Self-pay | Admitting: Physician Assistant

## 2015-03-09 ENCOUNTER — Other Ambulatory Visit: Payer: Self-pay | Admitting: Physician Assistant

## 2015-03-09 DIAGNOSIS — E785 Hyperlipidemia, unspecified: Secondary | ICD-10-CM | POA: Insufficient documentation

## 2015-03-09 DIAGNOSIS — E559 Vitamin D deficiency, unspecified: Secondary | ICD-10-CM | POA: Insufficient documentation

## 2015-03-09 LAB — VITAMIN B12: Vitamin B-12: 344 pg/mL (ref 211–911)

## 2015-03-09 LAB — COMPLETE METABOLIC PANEL WITH GFR
ALT: 18 U/L (ref 6–29)
AST: 24 U/L (ref 10–35)
Albumin: 4.2 g/dL (ref 3.6–5.1)
Alkaline Phosphatase: 105 U/L (ref 33–130)
BUN: 12 mg/dL (ref 7–25)
CO2: 28 mmol/L (ref 20–31)
Calcium: 9.4 mg/dL (ref 8.6–10.4)
Chloride: 105 mmol/L (ref 98–110)
Creat: 0.62 mg/dL (ref 0.50–0.99)
GFR, Est African American: >89 mL/min (ref >=60)
GLUCOSE: 80 mg/dL (ref 65–99)
POTASSIUM: 3.8 mmol/L (ref 3.5–5.3)
SODIUM: 141 mmol/L (ref 135–146)
Total Bilirubin: 0.8 mg/dL (ref 0.2–1.2)
Total Protein: 6.7 g/dL (ref 6.1–8.1)

## 2015-03-09 LAB — TSH: TSH: 0.765 u[IU]/mL (ref 0.350–4.500)

## 2015-03-09 LAB — LIPID PANEL
CHOL/HDL RATIO: 7.2 ratio — AB (ref <=5.0)
Cholesterol: 252 mg/dL — ABNORMAL HIGH (ref 125–200)
HDL: 35 mg/dL — AB (ref >=46)
LDL CALC: 170 mg/dL — AB (ref <130)
Triglycerides: 236 mg/dL — ABNORMAL HIGH (ref <150)
VLDL: 47 mg/dL — ABNORMAL HIGH (ref <30)

## 2015-03-09 LAB — VITAMIN D 25 HYDROXY (VIT D DEFICIENCY, FRACTURES): Vit D, 25-Hydroxy: 13 ng/mL — ABNORMAL LOW (ref 30–100)

## 2015-03-09 MED ORDER — VITAMIN D (ERGOCALCIFEROL) 1.25 MG (50000 UNIT) PO CAPS: 50000.0000 [IU] | ORAL_CAPSULE | ORAL | Status: DC

## 2015-03-10 ENCOUNTER — Telehealth: Payer: Self-pay | Admitting: Physician Assistant

## 2015-03-12 ENCOUNTER — Other Ambulatory Visit: Payer: Self-pay | Admitting: Physician Assistant

## 2015-03-12 MED ORDER — LEVOTHYROXINE SODIUM 112 MCG PO TABS: 112.0000 ug | ORAL_TABLET | Freq: Every day | ORAL | Status: DC

## 2015-03-12 MED ORDER — ATORVASTATIN CALCIUM 40 MG PO TABS: 40.0000 mg | ORAL_TABLET | Freq: Every day | ORAL | Status: DC

## 2015-03-13 ENCOUNTER — Encounter: Payer: Self-pay | Admitting: Physician Assistant

## 2015-03-13 ENCOUNTER — Ambulatory Visit (INDEPENDENT_AMBULATORY_CARE_PROVIDER_SITE_OTHER): Payer: BLUE CROSS/BLUE SHIELD | Admitting: Physician Assistant

## 2015-03-13 VITALS — BP 156/59 | HR 72 | Temp 98.6°F | Ht 63.0 in | Wt 178.0 lb

## 2015-03-13 DIAGNOSIS — N3001 Acute cystitis with hematuria: Secondary | ICD-10-CM | POA: Diagnosis not present

## 2015-03-13 LAB — POCT URINALYSIS DIPSTICK
Bilirubin, UA: NEGATIVE
Glucose, UA: 100
Ketones, UA: NEGATIVE
NITRITE UA: POSITIVE
PH UA: 6
PROTEIN UA: NEGATIVE
Spec Grav, UA: 1.015
Urobilinogen, UA: 1

## 2015-03-13 MED ORDER — CIPROFLOXACIN HCL 500 MG PO TABS: 500.0000 mg | ORAL_TABLET | Freq: Two times a day (BID) | ORAL | Status: DC

## 2015-03-13 MED ORDER — PHENAZOPYRIDINE HCL 100 MG PO TABS: 100.0000 mg | ORAL_TABLET | Freq: Three times a day (TID) | ORAL | Status: DC | PRN

## 2015-03-13 NOTE — Addendum Note (Signed)
Addended by: Beatris Ship L on: 03/13/2015 10:25 AM   Modules accepted: Orders

## 2015-03-13 NOTE — Patient Instructions (Signed)

## 2015-03-13 NOTE — Progress Notes (Addendum)
  LHT:[email or web address], MD   Current Issues:  Presents with 4 days of dysuria and urinary frequency Associated symptoms include:  flank pain bilaterally and fatigue. She denies any fever, chills, nausea or vomiting.   There is no history of of similar symptoms. She denies back pain or changes in bowel habits.   Prior to Admission medications   Medication Sig Start Date End Date Taking? Authorizing Provider  albuterol (PROAIR HFA) 108 (90 BASE) MCG/ACT inhaler Inhale 2-4 puffs into the lungs every 6 (six) hours as needed for wheezing or shortness of breath. 09/22/14   Hali Marry, MD  aspirin 81 MG tablet Take 81 mg by mouth daily.    Historical Provider, MD  atorvastatin (LIPITOR) 40 MG tablet Take 1 tablet (40 mg total) by mouth daily. 03/12/15   [name] L Breeback, PA-C  clonazePAM (KLONOPIN) 0.5 MG tablet Take 1 tablet (0.5 mg total) by mouth 2 (two) times daily as needed for anxiety. 10/17/14   [name] L Breeback, PA-C  dicyclomine (BENTYL) 10 MG capsule Take 1 capsule (10 mg total) by mouth 4 (four) times daily -  before meals and at bedtime. 08/22/14   [name] L Breeback, PA-C  escitalopram (LEXAPRO) 20 MG tablet Take 1 tablet (20 mg total) by mouth daily. 03/08/15   [name] L Breeback, PA-C  levothyroxine (SYNTHROID, LEVOTHROID) 112 MCG tablet Take 1 tablet (112 mcg total) by mouth daily. 03/12/15   [name] L Breeback, PA-C  oxybutynin (DITROPAN) 5 MG tablet Take 1 tablet (5 mg total) by mouth 2 (two) times daily. 03/08/15   [name] L Breeback, PA-C  temazepam (RESTORIL) 30 MG capsule Take 1 capsule (30 mg total) by mouth at bedtime as needed for sleep. 03/08/15   [name] L Breeback, PA-C  Vitamin D, Ergocalciferol, (DRISDOL) 50000 UNITS CAPS capsule Take 1 capsule (50,000 Units total) by mouth every 7 (seven) days. 03/09/15   Donella Stade, PA-C    Review of Systems:Positive for symptoms listed in HPI  PE:  LMP 06/24/2000 Constitutional: well developed, well nourished, and not under acute  distress Heart: Normal S1, S2. No M/G/R Lungs: CTAB, negative for any CVA tenderness.  Abdomen: +BS, nondistended. RLQ and LLQ tender to palpation  Urine Dipstick: Positive for nitrites, leukocytes, and blood.  Assessment and Plan:   UTI: Rx 500 mg ciprofloxacin bid x 3 days and 100 mg phenazopyridine tid x 2 days. Patient instructed to call if symptoms do not resolve within 3 days. Will culture dipstick.   Reviewed and changes made by [name].

## 2015-03-14 LAB — URINE CULTURE

## 2015-03-20 NOTE — Telephone Encounter (Signed)
Call pt: due for colonoscopy. Suggested 3 year follow up from 04/12/11. Can we schedule or would you like to do so yourself. Last done at [facility].

## 2015-03-22 ENCOUNTER — Telehealth: Payer: Self-pay | Admitting: Family Medicine

## 2015-03-22 NOTE — Telephone Encounter (Signed)
Please call patient and let her know that we did verify that she is not due for her next colonoscopy until October 2017. Good news.

## 2015-03-22 NOTE — Telephone Encounter (Signed)
Pt not actually due unto 03/2016.

## 2015-03-22 NOTE — Telephone Encounter (Signed)
Pt.notified

## 2015-04-26 ENCOUNTER — Ambulatory Visit (INDEPENDENT_AMBULATORY_CARE_PROVIDER_SITE_OTHER): Payer: BLUE CROSS/BLUE SHIELD | Admitting: Physician Assistant

## 2015-04-26 ENCOUNTER — Encounter: Payer: Self-pay | Admitting: Physician Assistant

## 2015-04-26 VITALS — BP 146/66 | HR 73 | Ht 63.0 in | Wt 180.0 lb

## 2015-04-26 DIAGNOSIS — IMO0001 Reserved for inherently not codable concepts without codable children: Secondary | ICD-10-CM

## 2015-04-26 DIAGNOSIS — F411 Generalized anxiety disorder: Secondary | ICD-10-CM

## 2015-04-26 DIAGNOSIS — R21 Rash and other nonspecific skin eruption: Secondary | ICD-10-CM

## 2015-04-26 DIAGNOSIS — R030 Elevated blood-pressure reading, without diagnosis of hypertension: Secondary | ICD-10-CM | POA: Diagnosis not present

## 2015-04-26 DIAGNOSIS — G4700 Insomnia, unspecified: Secondary | ICD-10-CM

## 2015-04-26 DIAGNOSIS — Z0000 Encounter for general adult medical examination without abnormal findings: Secondary | ICD-10-CM | POA: Diagnosis not present

## 2015-04-26 MED ORDER — LEVOTHYROXINE SODIUM 112 MCG PO TABS: 112.0000 ug | ORAL_TABLET | Freq: Every day | ORAL | Status: DC

## 2015-04-26 MED ORDER — OXYBUTYNIN CHLORIDE 5 MG PO TABS: 5.0000 mg | ORAL_TABLET | Freq: Two times a day (BID) | ORAL | Status: DC

## 2015-04-26 MED ORDER — ESCITALOPRAM OXALATE 20 MG PO TABS: 20.0000 mg | ORAL_TABLET | Freq: Every day | ORAL | Status: DC

## 2015-04-26 MED ORDER — TRIAMCINOLONE ACETONIDE 0.5 % EX OINT: 1.0000 "application " | TOPICAL_OINTMENT | Freq: Two times a day (BID) | CUTANEOUS | Status: DC

## 2015-04-26 MED ORDER — CLONAZEPAM 0.5 MG PO TABS: 0.5000 mg | ORAL_TABLET | Freq: Two times a day (BID) | ORAL | Status: DC | PRN

## 2015-04-26 MED ORDER — TEMAZEPAM 30 MG PO CAPS: 30.0000 mg | ORAL_CAPSULE | Freq: Every evening | ORAL | Status: DC | PRN

## 2015-04-26 MED ORDER — MUPIROCIN 2 % EX OINT: TOPICAL_OINTMENT | CUTANEOUS | Status: DC

## 2015-04-26 NOTE — Progress Notes (Signed)
Subjective:     Angelica [name] is a 64 y.o. female and is here for a comprehensive physical exam. The patient reports problems - red itching rash under right breast. present for 1 week. not tried anything to make better.  does not remember any bug bite.   Social History   Social History  . Marital Status: Married    Spouse Name: Sonia Side  . Number of Children: 1  . Years of Education: HS   Occupational History  . [employer]    Social History Main Topics  . Smoking status: Former Smoker    Quit date: 06/25/2011  . Smokeless tobacco: Not on file  . Alcohol Use: No  . Drug Use: No  . Sexual Activity: Not on file   Other Topics Concern  . Not on file   Social History Narrative   Walks 30 min per day.    Health Maintenance  Topic Date Due  . Hepatitis C Screening  July 24, 1950  . HIV Screening  09/25/1965  . PAP SMEAR  04/25/2025 (Originally 09/26/1971)  . INFLUENZA VACCINE  01/23/2016  . COLONOSCOPY  04/15/2016  . MAMMOGRAM  07/21/2016  . TETANUS/TDAP  01/30/2021  . ZOSTAVAX  Completed    The following portions of the patient's history were reviewed and updated as appropriate: allergies, current medications, past family history, past medical history, past social history, past surgical history and problem list.  Review of Systems A comprehensive review of systems was negative.   Objective:    BP 146/66 mmHg  Pulse 73  Ht 5\' 3"  (1.6 m)  Wt 180 lb (81.647 kg)  BMI 31.89 kg/m2  LMP 06/24/2000 General appearance: alert, cooperative, appears stated age and mildly obese Head: Normocephalic, without obvious abnormality, atraumatic Eyes: conjunctivae/corneas clear. PERRL, EOM's intact. Fundi benign. pterygium medial sclera of left eye.  Ears: normal TM's and external ear canals both ears Nose: Nares normal. Septum midline. Mucosa normal. No drainage or sinus tenderness. Throat: lips, mucosa, and tongue normal; teeth and gums normal Neck: no adenopathy, no carotid bruit, no JVD,  supple, symmetrical, trachea midline and thyroid not enlarged, symmetric, no tenderness/mass/nodules Back: symmetric, no curvature. ROM normal. No CVA tenderness. Lungs: clear to auscultation bilaterally Heart: regular rate and rhythm, S1, S2 normal, no murmur, click, rub or gallop Abdomen: soft, non-tender; bowel sounds normal; no masses,  no organomegaly Pelvic: external genitalia normal, no adnexal masses or tenderness, no cervical motion tenderness, uterus normal size, shape, and consistency and vagina normal without discharge Extremities: extremities normal, atraumatic, no cyanosis or edema Pulses: 2+ and symmetric Skin: Skin color, texture, turgor normal. No rashes or lesions under right breast 4cm by 2cm erythematous rask with tiny little scabs and open slightly oozing area in the center.  Lymph nodes: Cervical, supraclavicular, and axillary nodes normal. Neurologic: Grossly normal    Assessment:    Healthy female exam.      Plan:  cPE- labs done already and discussed in office. Pt is up to date on vaccines, mammogram and colonoscopy. Pt declined pap today.   Elevated blood pressure- asymptomatic. Recheck nurse visit in 2 weeks. No hx of HTN.   Rash/skin infection- appears like allergic dermatitis and she scratch so much she has a little skin infection. Triamcinolone and bactroban given today to use for next 2 weeks. Follow up if not improving or worsening.   Anxiety and depression- lexapro given for 6 months. Doing great.   OAB- controlled, refilled for 6 months.   Insomnia- controlled, refilled  temazepam.      See After Visit Summary for Counseling Recommendations

## 2015-04-26 NOTE — Patient Instructions (Signed)

## 2015-06-06 ENCOUNTER — Ambulatory Visit (INDEPENDENT_AMBULATORY_CARE_PROVIDER_SITE_OTHER): Payer: BLUE CROSS/BLUE SHIELD | Admitting: Physician Assistant

## 2015-06-06 ENCOUNTER — Encounter: Payer: Self-pay | Admitting: Physician Assistant

## 2015-06-06 VITALS — BP 146/56 | HR 73 | Temp 98.3°F | Ht 63.0 in | Wt 181.0 lb

## 2015-06-06 DIAGNOSIS — J0100 Acute maxillary sinusitis, unspecified: Secondary | ICD-10-CM

## 2015-06-06 DIAGNOSIS — F419 Anxiety disorder, unspecified: Secondary | ICD-10-CM

## 2015-06-06 DIAGNOSIS — F329 Major depressive disorder, single episode, unspecified: Secondary | ICD-10-CM

## 2015-06-06 DIAGNOSIS — F32A Depression, unspecified: Secondary | ICD-10-CM

## 2015-06-06 DIAGNOSIS — F418 Other specified anxiety disorders: Secondary | ICD-10-CM

## 2015-06-06 MED ORDER — ESCITALOPRAM OXALATE 20 MG PO TABS: 20.0000 mg | ORAL_TABLET | Freq: Every day | ORAL | Status: DC

## 2015-06-06 MED ORDER — AZITHROMYCIN 250 MG PO TABS: ORAL_TABLET | ORAL | Status: DC

## 2015-06-06 NOTE — Progress Notes (Signed)
   Subjective:    Patient ID: Angelica [name], female    DOB: 06-11-1951, 64 y.o.   MRN: [id number]  HPI  Pt is a 64 yo female who presents to the clinic with 6 days of sinus pressure, headache, congestion. She has tried tylenol, delsym, sudafed, and unisom with little relief. She denies any fever. Cough is dry. She does have a mild ST and no ear pain.   She would like lexapro refilled. Doing much better since switching. Has no side effects or concerns.   Review of Systems  All other systems reviewed and are negative.      Objective:   Physical Exam  Constitutional: She is oriented to person, place, and time. She appears well-developed and well-nourished.  HENT:  Head: Normocephalic and atraumatic.  Right Ear: External ear normal.  Left Ear: External ear normal.  Thick nasal mucus in bilateral erythematous nasal turbinates.  TM's slightly bulging with no blood or pus. Good light reflex.  Frontal and maxillary sinus tenderness to palpation.  Oropharynx erythematous.   Eyes:  Bilateral injected conjunctiva with dark circles under eyes.   Neck: Normal range of motion. Neck supple.  Cardiovascular: Normal rate, regular rhythm and normal heart sounds.   Pulmonary/Chest: Effort normal and breath sounds normal. She has no wheezes.  Lymphadenopathy:    She has no cervical adenopathy.  Neurological: She is alert and oriented to person, place, and time.  Psychiatric: She has a normal mood and affect. Her behavior is normal.          Assessment & Plan:  Sinusitis- treated with zpak. Allergic to sulf and PCN. Symptomatic care discussed. Written out of work for a few days. Rest and hydrate.   Anxiety and depression: PHQ-9 was 5.  GAD-7 was 5.  Much improved. Sent lexapro for 6 months.

## 2015-06-06 NOTE — Patient Instructions (Signed)

## 2015-07-24 ENCOUNTER — Encounter: Payer: Self-pay | Admitting: Physician Assistant

## 2015-07-24 ENCOUNTER — Ambulatory Visit (INDEPENDENT_AMBULATORY_CARE_PROVIDER_SITE_OTHER): Payer: BLUE CROSS/BLUE SHIELD | Admitting: Physician Assistant

## 2015-07-24 VITALS — BP 139/48 | HR 71 | Temp 98.2°F | Ht 63.0 in | Wt 182.0 lb

## 2015-07-24 DIAGNOSIS — J309 Allergic rhinitis, unspecified: Secondary | ICD-10-CM | POA: Diagnosis not present

## 2015-07-24 MED ORDER — PREDNISONE 50 MG PO TABS: ORAL_TABLET | ORAL | Status: DC

## 2015-07-25 NOTE — Progress Notes (Signed)
   Subjective:    Patient ID: Angelica [name], female    DOB: May 27, 1951, 65 y.o.   MRN: [id number]  HPI Pt is a 65 yo female who presents to the clinic with one week for sinus pressure and congestion. She has been sneezing and has itchy watery eyes. Not tried anything to make better. Cough is non-productive. No fever, chills, body aches. She has had a ST and ear fullness.    Review of Systems  All other systems reviewed and are negative.      Objective:   Physical Exam  Constitutional: She is oriented to person, place, and time. She appears well-developed and well-nourished.  HENT:  Head: Normocephalic and atraumatic.  Right Ear: External ear normal.  Left Ear: External ear normal.  Mouth/Throat: No oropharyngeal exudate.  TM's erythematous no pus or blood. + light reflex.  oropharynx erythematous with PND. No tonsil swelling or exudate.  Bilateral nasal turbinates red and swollen.  Tenderness over frontal and maxillary sinuses.   Eyes: Right eye exhibits discharge. Left eye exhibits discharge.  Bilateral injected conjunctiva with watery discharge.   Neck: Normal range of motion. Neck supple.  Cardiovascular: Normal rate, regular rhythm and normal heart sounds.   Pulmonary/Chest: Effort normal and breath sounds normal. She has no wheezes.  Lymphadenopathy:    She has no cervical adenopathy.  Neurological: She is alert and oriented to person, place, and time.  Psychiatric: She has a normal mood and affect. Her behavior is normal.          Assessment & Plan:  Allergic sinusitis- I do not see any bacterial signs more watery eyes. Prednisone given. Encouraged flonase 2 sprays each nostril, zyrtec at bedtime and mucinex. Call office if not improving or worsening over next 2 days.

## 2015-08-28 ENCOUNTER — Ambulatory Visit (INDEPENDENT_AMBULATORY_CARE_PROVIDER_SITE_OTHER): Payer: BLUE CROSS/BLUE SHIELD | Admitting: Family Medicine

## 2015-08-28 ENCOUNTER — Encounter: Payer: Self-pay | Admitting: Family Medicine

## 2015-08-28 VITALS — BP 152/54 | HR 68 | Wt 184.0 lb

## 2015-08-28 DIAGNOSIS — M7542 Impingement syndrome of left shoulder: Secondary | ICD-10-CM | POA: Diagnosis not present

## 2015-08-28 MED ORDER — TRAMADOL HCL 50 MG PO TABS
50.0000 mg | ORAL_TABLET | Freq: Three times a day (TID) | ORAL | Status: DC | PRN
Start: 2015-08-28 — End: 2017-06-04

## 2015-08-28 NOTE — Assessment & Plan Note (Signed)
Associated with left trapezius spasm due to overuse. Treatment with home health exercises injection and tramadol. If not better would recommend physical therapy/MRI. Recheck in about one month.

## 2015-08-28 NOTE — Patient Instructions (Signed)
Thank you for coming in today. Call or go to the ER if you develop a large red swollen joint with extreme pain or oozing puss.  Return in 1 month. \  Impingement Syndrome, Rotator Cuff, Bursitis With Rehab Impingement syndrome is a condition that involves inflammation of the tendons of the rotator cuff and the subacromial bursa, that causes pain in the shoulder. The rotator cuff consists of four tendons and muscles that control much of the shoulder and upper arm function. The subacromial bursa is a fluid filled sac that helps reduce friction between the rotator cuff and one of the bones of the shoulder (acromion). Impingement syndrome is usually an overuse injury that causes swelling of the bursa (bursitis), swelling of the tendon (tendonitis), and/or a tear of the tendon (strain). Strains are classified into three categories. Grade 1 strains cause pain, but the tendon is not lengthened. Grade 2 strains include a lengthened ligament, due to the ligament being stretched or partially ruptured. With grade 2 strains there is still function, although the function may be decreased. Grade 3 strains include a complete tear of the tendon or muscle, and function is usually impaired. SYMPTOMS   Pain around the shoulder, often at the outer portion of the upper arm.  Pain that gets worse with shoulder function, especially when reaching overhead or lifting.  Sometimes, aching when not using the arm.  Pain that wakes you up at night.  Sometimes, tenderness, swelling, warmth, or redness over the affected area.  Loss of strength.  Limited motion of the shoulder, especially reaching behind the back (to the back pocket or to unhook bra) or across your body.  Crackling sound (crepitation) when moving the arm.  Biceps tendon pain and inflammation (in the front of the shoulder). Worse when bending the elbow or lifting. CAUSES  Impingement syndrome is often an overuse injury, in which chronic (repetitive) motions  cause the tendons or bursa to become inflamed. A strain occurs when a force is paced on the tendon or muscle that is greater than it can withstand. Common mechanisms of injury include: Stress from sudden increase in duration, frequency, or intensity of training.  Direct hit (trauma) to the shoulder.  Aging, erosion of the tendon with normal use.  Bony bump on shoulder (acromial spur). RISK INCREASES WITH:  Contact sports (football, wrestling, boxing).  Throwing sports (baseball, tennis, volleyball).  Weightlifting and bodybuilding.  Heavy labor.  Previous injury to the rotator cuff, including impingement.  Poor shoulder strength and flexibility.  Failure to warm up properly before activity.  Inadequate protective equipment.  Old age.  Bony bump on shoulder (acromial spur). PREVENTION   Warm up and stretch properly before activity.  Allow for adequate recovery between workouts.  Maintain physical fitness:  Strength, flexibility, and endurance.  Cardiovascular fitness.  Learn and use proper exercise technique. PROGNOSIS  If treated properly, impingement syndrome usually goes away within 6 weeks. Sometimes surgery is required.  RELATED COMPLICATIONS   Longer healing time if not properly treated, or if not given enough time to heal.  Recurring symptoms, that result in a chronic condition.  Shoulder stiffness, frozen shoulder, or loss of motion.  Rotator cuff tendon tear.  Recurring symptoms, especially if activity is resumed too soon, with overuse, with a direct blow, or when using poor technique. TREATMENT  Treatment first involves the use of ice and medicine, to reduce pain and inflammation. The use of strengthening and stretching exercises may help reduce pain with activity. These exercises may  be performed at home or with a therapist. If non-surgical treatment is unsuccessful after more than 6 months, surgery may be advised. After surgery and rehabilitation,  activity is usually possible in 3 months.  MEDICATION  If pain medicine is needed, nonsteroidal anti-inflammatory medicines (aspirin and ibuprofen), or other minor pain relievers (acetaminophen), are often advised.  Do not take pain medicine for 7 days before surgery.  Prescription pain relievers may be given, if your caregiver thinks they are needed. Use only as directed and only as much as you need.  Corticosteroid injections may be given by your caregiver. These injections should be reserved for the most serious cases, because they may only be given a certain number of times. HEAT AND COLD  Cold treatment (icing) should be applied for 10 to 15 minutes every 2 to 3 hours for inflammation and pain, and immediately after activity that aggravates your symptoms. Use ice packs or an ice massage.  Heat treatment may be used before performing stretching and strengthening activities prescribed by your caregiver, physical therapist, or athletic trainer. Use a heat pack or a warm water soak. SEEK MEDICAL CARE IF:   Symptoms get worse or do not improve in 4 to 6 weeks, despite treatment.  New, unexplained symptoms develop. (Drugs used in treatment may produce side effects.) EXERCISES  RANGE OF MOTION (ROM) AND STRETCHING EXERCISES - Impingement Syndrome (Rotator Cuff  Tendinitis, Bursitis) These exercises may help you when beginning to rehabilitate your injury. Your symptoms may go away with or without further involvement from your physician, physical therapist or athletic trainer. While completing these exercises, remember:   Restoring tissue flexibility helps normal motion to return to the joints. This allows healthier, less painful movement and activity.  An effective stretch should be held for at least 30 seconds.  A stretch should never be painful. You should only feel a gentle lengthening or release in the stretched tissue. STRETCH - Flexion, Standing  Stand with good posture. With an  underhand grip on your right / left hand, and an overhand grip on the opposite hand, grasp a broomstick or cane so that your hands are a little more than shoulder width apart.  Keeping your right / left elbow straight and shoulder muscles relaxed, push the stick with your opposite hand, to raise your right / left arm in front of your body and then overhead. Raise your arm until you feel a stretch in your right / left shoulder, but before you have increased shoulder pain.  Try to avoid shrugging your right / left shoulder as your arm rises, by keeping your shoulder blade tucked down and toward your mid-back spine. Hold for __________ seconds.  Slowly return to the starting position. Repeat __________ times. Complete this exercise __________ times per day. STRETCH - Abduction, Supine  Lie on your back. With an underhand grip on your right / left hand and an overhand grip on the opposite hand, grasp a broomstick or cane so that your hands are a little more than shoulder width apart.  Keeping your right / left elbow straight and your shoulder muscles relaxed, push the stick with your opposite hand, to raise your right / left arm out to the side of your body and then overhead. Raise your arm until you feel a stretch in your right / left shoulder, but before you have increased shoulder pain.  Try to avoid shrugging your right / left shoulder as your arm rises, by keeping your shoulder blade tucked down and  toward your mid-back spine. Hold for __________ seconds.  Slowly return to the starting position. Repeat __________ times. Complete this exercise __________ times per day. ROM - Flexion, Active-Assisted  Lie on your back. You may bend your knees for comfort.  Grasp a broomstick or cane so your hands are about shoulder width apart. Your right / left hand should grip the end of the stick, so that your hand is positioned "thumbs-up," as if you were about to shake hands.  Using your healthy arm to  lead, raise your right / left arm overhead, until you feel a gentle stretch in your shoulder. Hold for __________ seconds.  Use the stick to assist in returning your right / left arm to its starting position. Repeat __________ times. Complete this exercise __________ times per day.  ROM - Internal Rotation, Supine   Lie on your back on a firm surface. Place your right / left elbow about 60 degrees away from your side. Elevate your elbow with a folded towel, so that the elbow and shoulder are the same height.  Using a broomstick or cane and your strong arm, pull your right / left hand toward your body until you feel a gentle stretch, but no increase in your shoulder pain. Keep your shoulder and elbow in place throughout the exercise.  Hold for __________ seconds. Slowly return to the starting position. Repeat __________ times. Complete this exercise __________ times per day. STRETCH - Internal Rotation  Place your right / left hand behind your back, palm up.  Throw a towel or belt over your opposite shoulder. Grasp the towel with your right / left hand.  While keeping an upright posture, gently pull up on the towel, until you feel a stretch in the front of your right / left shoulder.  Avoid shrugging your right / left shoulder as your arm rises, by keeping your shoulder blade tucked down and toward your mid-back spine.  Hold for __________ seconds. Release the stretch, by lowering your healthy hand. Repeat __________ times. Complete this exercise __________ times per day. ROM - Internal Rotation   Using an underhand grip, grasp a stick behind your back with both hands.  While standing upright with good posture, slide the stick up your back until you feel a mild stretch in the front of your shoulder.  Hold for __________ seconds. Slowly return to your starting position. Repeat __________ times. Complete this exercise __________ times per day.  STRETCH - Posterior Shoulder Capsule    Stand or sit with good posture. Grasp your right / left elbow and draw it across your chest, keeping it at the same height as your shoulder.  Pull your elbow, so your upper arm comes in closer to your chest. Pull until you feel a gentle stretch in the back of your shoulder.  Hold for __________ seconds. Repeat __________ times. Complete this exercise __________ times per day. STRENGTHENING EXERCISES - Impingement Syndrome (Rotator Cuff Tendinitis, Bursitis) These exercises may help you when beginning to rehabilitate your injury. They may resolve your symptoms with or without further involvement from your physician, physical therapist or athletic trainer. While completing these exercises, remember:  Muscles can gain both the endurance and the strength needed for everyday activities through controlled exercises.  Complete these exercises as instructed by your physician, physical therapist or athletic trainer. Increase the resistance and repetitions only as guided.  You may experience muscle soreness or fatigue, but the pain or discomfort you are trying to eliminate should never worsen  during these exercises. If this pain does get worse, stop and make sure you are following the directions exactly. If the pain is still present after adjustments, discontinue the exercise until you can discuss the trouble with your clinician.  During your recovery, avoid activity or exercises which involve actions that place your injured hand or elbow above your head or behind your back or head. These positions stress the tissues which you are trying to heal. STRENGTH - Scapular Depression and Adduction   With good posture, sit on a firm chair. Support your arms in front of you, with pillows, arm rests, or on a table top. Have your elbows in line with the sides of your body.  Gently draw your shoulder blades down and toward your mid-back spine. Gradually increase the tension, without tensing the muscles along the  top of your shoulders and the back of your neck.  Hold for __________ seconds. Slowly release the tension and relax your muscles completely before starting the next repetition.  After you have practiced this exercise, remove the arm support and complete the exercise in standing as well as sitting position. Repeat __________ times. Complete this exercise __________ times per day.  STRENGTH - Shoulder Abductors, Isometric  With good posture, stand or sit about 4-6 inches from a wall, with your right / left side facing the wall.  Bend your right / left elbow. Gently press your right / left elbow into the wall. Increase the pressure gradually, until you are pressing as hard as you can, without shrugging your shoulder or increasing any shoulder discomfort.  Hold for __________ seconds.  Release the tension slowly. Relax your shoulder muscles completely before you begin the next repetition. Repeat __________ times. Complete this exercise __________ times per day.  STRENGTH - External Rotators, Isometric  Keep your right / left elbow at your side and bend it 90 degrees.  Step into a door frame so that the outside of your right / left wrist can press against the door frame without your upper arm leaving your side.  Gently press your right / left wrist into the door frame, as if you were trying to swing the back of your hand away from your stomach. Gradually increase the tension, until you are pressing as hard as you can, without shrugging your shoulder or increasing any shoulder discomfort.  Hold for __________ seconds.  Release the tension slowly. Relax your shoulder muscles completely before you begin the next repetition. Repeat __________ times. Complete this exercise __________ times per day.  STRENGTH - Supraspinatus   Stand or sit with good posture. Grasp a __________ weight, or an exercise band or tubing, so that your hand is "thumbs-up," like you are shaking hands.  Slowly lift your  right / left arm in a "V" away from your thigh, diagonally into the space between your side and straight ahead. Lift your hand to shoulder height or as far as you can, without increasing any shoulder pain. At first, many people do not lift their hands above shoulder height.  Avoid shrugging your right / left shoulder as your arm rises, by keeping your shoulder blade tucked down and toward your mid-back spine.  Hold for __________ seconds. Control the descent of your hand, as you slowly return to your starting position. Repeat __________ times. Complete this exercise __________ times per day.  STRENGTH - External Rotators  Secure a rubber exercise band or tubing to a fixed object (table, pole) so that it is at the same  height as your right / left elbow when you are standing or sitting on a firm surface.  Stand or sit so that the secured exercise band is at your uninjured side.  Bend your right / left elbow 90 degrees. Place a folded towel or small pillow under your right / left arm, so that your elbow is a few inches away from your side.  Keeping the tension on the exercise band, pull it away from your body, as if pivoting on your elbow. Be sure to keep your body steady, so that the movement is coming only from your rotating shoulder.  Hold for __________ seconds. Release the tension in a controlled manner, as you return to the starting position. Repeat __________ times. Complete this exercise __________ times per day.  STRENGTH - Internal Rotators   Secure a rubber exercise band or tubing to a fixed object (table, pole) so that it is at the same height as your right / left elbow when you are standing or sitting on a firm surface.  Stand or sit so that the secured exercise band is at your right / left side.  Bend your elbow 90 degrees. Place a folded towel or small pillow under your right / left arm so that your elbow is a few inches away from your side.  Keeping the tension on the exercise  band, pull it across your body, toward your stomach. Be sure to keep your body steady, so that the movement is coming only from your rotating shoulder.  Hold for __________ seconds. Release the tension in a controlled manner, as you return to the starting position. Repeat __________ times. Complete this exercise __________ times per day.  STRENGTH - Scapular Protractors, Standing   Stand arms length away from a wall. Place your hands on the wall, keeping your elbows straight.  Begin by dropping your shoulder blades down and toward your mid-back spine.  To strengthen your protractors, keep your shoulder blades down, but slide them forward on your rib cage. It will feel as if you are lifting the back of your rib cage away from the wall. This is a subtle motion and can be challenging to complete. Ask your caregiver for further instruction, if you are not sure you are doing the exercise correctly.  Hold for __________ seconds. Slowly return to the starting position, resting the muscles completely before starting the next repetition. Repeat __________ times. Complete this exercise __________ times per day. STRENGTH - Scapular Protractors, Supine  Lie on your back on a firm surface. Extend your right / left arm straight into the air while holding a __________ weight in your hand.  Keeping your head and back in place, lift your shoulder off the floor.  Hold for __________ seconds. Slowly return to the starting position, and allow your muscles to relax completely before starting the next repetition. Repeat __________ times. Complete this exercise __________ times per day. STRENGTH - Scapular Protractors, Quadruped  Get onto your hands and knees, with your shoulders directly over your hands (or as close as you can be, comfortably).  Keeping your elbows locked, lift the back of your rib cage up into your shoulder blades, so your mid-back rounds out. Keep your neck muscles relaxed.  Hold this  position for __________ seconds. Slowly return to the starting position and allow your muscles to relax completely before starting the next repetition. Repeat __________ times. Complete this exercise __________ times per day.  STRENGTH - Scapular Retractors  Secure a rubber exercise  band or tubing to a fixed object (table, pole), so that it is at the height of your shoulders when you are either standing, or sitting on a firm armless chair.  With a palm down grip, grasp an end of the band in each hand. Straighten your elbows and lift your hands straight in front of you, at shoulder height. Step back, away from the secured end of the band, until it becomes tense.  Squeezing your shoulder blades together, draw your elbows back toward your sides, as you bend them. Keep your upper arms lifted away from your body throughout the exercise.  Hold for __________ seconds. Slowly ease the tension on the band, as you reverse the directions and return to the starting position. Repeat __________ times. Complete this exercise __________ times per day. STRENGTH - Shoulder Extensors   Secure a rubber exercise band or tubing to a fixed object (table, pole) so that it is at the height of your shoulders when you are either standing, or sitting on a firm armless chair.  With a thumbs-up grip, grasp an end of the band in each hand. Straighten your elbows and lift your hands straight in front of you, at shoulder height. Step back, away from the secured end of the band, until it becomes tense.  Squeezing your shoulder blades together, pull your hands down to the sides of your thighs. Do not allow your hands to go behind you.  Hold for __________ seconds. Slowly ease the tension on the band, as you reverse the directions and return to the starting position. Repeat __________ times. Complete this exercise __________ times per day.  STRENGTH - Scapular Retractors and External Rotators   Secure a rubber exercise band or  tubing to a fixed object (table, pole) so that it is at the height as your shoulders, when you are either standing, or sitting on a firm armless chair.  With a palm down grip, grasp an end of the band in each hand. Bend your elbows 90 degrees and lift your elbows to shoulder height, at your sides. Step back, away from the secured end of the band, until it becomes tense.  Squeezing your shoulder blades together, rotate your shoulders so that your upper arms and elbows remain stationary, but your fists travel upward to head height.  Hold for __________ seconds. Slowly ease the tension on the band, as you reverse the directions and return to the starting position. Repeat __________ times. Complete this exercise __________ times per day.  STRENGTH - Scapular Retractors and External Rotators, Rowing   Secure a rubber exercise band or tubing to a fixed object (table, pole) so that it is at the height of your shoulders, when you are either standing, or sitting on a firm armless chair.  With a palm down grip, grasp an end of the band in each hand. Straighten your elbows and lift your hands straight in front of you, at shoulder height. Step back, away from the secured end of the band, until it becomes tense.  Step 1: Squeeze your shoulder blades together. Bending your elbows, draw your hands to your chest, as if you are rowing a boat. At the end of this motion, your hands and elbow should be at shoulder height and your elbows should be out to your sides.  Step 2: Rotate your shoulders, to raise your hands above your head. Your forearms should be vertical and your upper arms should be horizontal.  Hold for __________ seconds. Slowly ease the tension  on the band, as you reverse the directions and return to the starting position. Repeat __________ times. Complete this exercise __________ times per day.  STRENGTH - Scapular Depressors  Find a sturdy chair without wheels, such as a dining room  chair.  Keeping your feet on the floor, and your hands on the chair arms, lift your bottom up from the seat, and lock your elbows.  Keeping your elbows straight, allow gravity to pull your body weight down. Your shoulders will rise toward your ears.  Raise your body against gravity by drawing your shoulder blades down your back, shortening the distance between your shoulders and ears. Although your feet should always maintain contact with the floor, your feet should progressively support less body weight, as you get stronger.  Hold for __________ seconds. In a controlled and slow manner, lower your body weight to begin the next repetition. Repeat __________ times. Complete this exercise __________ times per day.    This information is not intended to replace advice given to you by your health care provider. Make sure you discuss any questions you have with your health care provider.   Document Released: 06/10/2005 Document Revised: 07/01/2014 Document Reviewed: 09/22/2008 [employer] Interactive Patient Education [employer].

## 2015-08-28 NOTE — Progress Notes (Signed)
   Subjective:    I'm seeing this patient as a consultation for:  [name]  CC: Left shoulder pain.  HPI:  Patient notes a several week history of left shoulder pain. Patient notes pain in the lateral left upper arm and into the left trapezius. Pain is worse with overhead motion reaching back. She denies any radiating pain weakness or numbness fevers or chills. She denies any specific injury. She's tried some over-the-counter medicines that helped a bit. She has pain especially at nighttime when she's trying to sleep.   Past medical history, Surgical history, Family history not pertinant except as noted below, Social history, Allergies, and medications have been entered into the medical record, reviewed, and no changes needed.   Review of Systems: No headache, visual changes, nausea, vomiting, diarrhea, constipation, dizziness, abdominal pain, skin rash, fevers, chills, night sweats, weight loss, swollen lymph nodes, body aches, joint swelling, muscle aches, chest pain, shortness of breath, mood changes, visual or auditory hallucinations.   Objective:    Filed Vitals:   08/28/15 1323  BP: 152/54  Pulse: 68   General: Well Developed, well nourished, and in no acute distress.  Neuro/Psych: Alert and oriented x3, extra-ocular muscles intact, able to move all 4 extremities, sensation grossly intact. Skin: Warm and dry, no rashes noted.  Respiratory: Not using accessory muscles, speaking in full sentences, trachea midline.  Cardiovascular: Pulses palpable, no extremity edema. Abdomen: Does not appear distended. MSK: Neck: Nontender to midline normal neck motion Tender palpation left trapezius. Pain with shoulder shrug. Left shoulder normal-appearing nontender. Normal external and internal rotational motion. Abduction is limited to 100 by pain active and passive. Positive Hawkins and Neer's test. Positive empty can test. Negative Yergason's and speeds test.  Procedure:  Real-time Ultrasound Guided Injection of left subacromial bursa  Device: GE Logiq E  Images permanently stored and available for review in the ultrasound unit. Verbal informed consent obtained. Discussed risks and benefits of procedure. Warned about infection bleeding damage to structures skin hypopigmentation and fat atrophy among others. Patient expresses understanding and agreement Time-out conducted.  Noted no overlying erythema, induration, or other signs of local infection.  Skin prepped in a sterile fashion.  Local anesthesia: Topical Ethyl chloride.  With sterile technique and under real time ultrasound guidance: 40 mg of Kenalog and 3 mL of Marcaine injected easily.  Completed without difficulty  Pain immediately resolved suggesting accurate placement of the medication.  Advised to call if fevers/chills, erythema, induration, drainage, or persistent bleeding.  Images permanently stored and available for review in the ultrasound unit.  Impression: Technically successful ultrasound guided injection.    No results found for this or any previous visit (from the past 24 hour(s)). No results found.  Impression and Recommendations:   This case required medical decision making of moderate complexity.

## 2015-09-05 ENCOUNTER — Other Ambulatory Visit: Payer: Self-pay | Admitting: Physician Assistant

## 2015-09-28 ENCOUNTER — Ambulatory Visit (INDEPENDENT_AMBULATORY_CARE_PROVIDER_SITE_OTHER): Payer: BLUE CROSS/BLUE SHIELD | Admitting: Family Medicine

## 2015-09-28 ENCOUNTER — Encounter: Payer: Self-pay | Admitting: Family Medicine

## 2015-09-28 ENCOUNTER — Other Ambulatory Visit: Payer: Self-pay | Admitting: Physician Assistant

## 2015-09-28 VITALS — BP 141/81 | HR 77 | Wt 183.0 lb

## 2015-09-28 DIAGNOSIS — M62838 Other muscle spasm: Secondary | ICD-10-CM | POA: Insufficient documentation

## 2015-09-28 DIAGNOSIS — M6248 Contracture of muscle, other site: Secondary | ICD-10-CM | POA: Diagnosis not present

## 2015-09-28 NOTE — Progress Notes (Signed)
Angelica [name] is a 65 y.o. female who presents to [facility] today for follow-up left shoulder pain. Patient was seen one month ago for pain in her shoulder thought to be related to subacromial bursitis. She had a subacromial injection and feels 100% better in her shoulder. She notes continued pain in the trapezius area especially with arm motion. Her pain is about 90% improved. No radiating pain weakness or numbness fevers or chills. She notes that she cannot afford the time or money to attend physical therapy right now.   Past Medical History  Diagnosis Date  . Asthma   . Anxiety   . Depression   . Allergy   . Breast cancer ([place]) 2009  . Postmenopausal   . Thyroid disease    Past Surgical History  Procedure Laterality Date  . Mastectomy  2009    [facility]  . Breast reconstruction  06/2008  . Adenoidectomy    . Tonsillectomy     Social History  Substance Use Topics  . Smoking status: Former Smoker    Quit date: 06/25/2011  . Smokeless tobacco: Not on file  . Alcohol Use: No   family history includes Cancer in her mother; Hyperlipidemia in her father; Hypertension in her father; Stroke in her mother.  ROS as above Medications: Current Outpatient Prescriptions  Medication Sig Dispense Refill  . albuterol (PROAIR HFA) 108 (90 BASE) MCG/ACT inhaler Inhale 2-4 puffs into the lungs every 6 (six) hours as needed for wheezing or shortness of breath. 18 g 2  . AMBULATORY NON FORMULARY MEDICATION daily. Alive multivitamin    . aspirin 81 MG tablet Take 81 mg by mouth daily.    [name] atorvastatin (LIPITOR) 40 MG tablet TAKE ONE TABLET BY MOUTH ONCE DAILY 90 tablet 3  . Cholecalciferol (VITAMIN D PO) Take 2,000 Int'l Units by mouth daily.    . clonazePAM (KLONOPIN) 0.5 MG tablet Take 1 tablet (0.5 mg total) by mouth 2 (two) times daily as needed for anxiety. 30 tablet 5  . dicyclomine  (BENTYL) 10 MG capsule Take 1 capsule (10 mg total) by mouth 4 (four) times daily -  before meals and at bedtime. 360 capsule 5  . escitalopram (LEXAPRO) 20 MG tablet Take 1 tablet (20 mg total) by mouth daily. 90 tablet 1  . levothyroxine (SYNTHROID, LEVOTHROID) 112 MCG tablet Take 1 tablet (112 mcg total) by mouth daily. 90 tablet 1  . oxybutynin (DITROPAN) 5 MG tablet Take 1 tablet (5 mg total) by mouth 2 (two) times daily. 180 tablet 1  . predniSONE (DELTASONE) 50 MG tablet Take one tablet for 5 days. 5 tablet 0  . temazepam (RESTORIL) 30 MG capsule Take 1 capsule (30 mg total) by mouth at bedtime as needed for sleep. 90 capsule 1  . traMADol (ULTRAM) 50 MG tablet Take 1 tablet (50 mg total) by mouth every 8 (eight) hours as needed. 30 tablet 0  . [DISCONTINUED] oxybutynin (DITROPAN) 5 MG tablet Take 1 tablet (5 mg total) by mouth 2 (two) times daily. 60 tablet 3   No current facility-administered medications for this visit.   Allergies  Allergen Reactions  . Elavil [Amitriptyline]     Crazy dreams   . Macrodantin Nausea And Vomiting  . Red Dye Hives  . Sulfa Antibiotics   . Penicillins Rash     Exam:  BP 141/81 mmHg  Pulse 77  Wt 183 lb (83.008 kg)  LMP 06/24/2000 Gen:  Well NAD Neck nontender to midline. Tender palpation left trapezius. Normal neck motion. Shoulder bilateral nontender normal motion negative impingement.  No results found for this or any previous visit (from the past 24 hour(s)). No results found.   Please see individual assessment and plan sections.

## 2015-09-28 NOTE — Assessment & Plan Note (Signed)
Shoulder bursitis/impingement resolved. Lingering trapezius muscle spasm and dysfunction. I would like to do physical therapy but patient just can't right now. Plan for home therapy with exercises TENS unit and heating pad. Recheck in one month.

## 2015-09-28 NOTE — Patient Instructions (Addendum)
Thank you for coming in today. Use the heating pad and TENS unit.  Do the exercises.  Return in 4 weeks.   TENS UNIT: This is helpful for muscle pain and spasm.   Search and Purchase a TENS 7000 2nd edition at www.tenspros.com. It should be less than $30.     TENS unit instructions: Do not shower or bathe with the unit on Turn the unit off before removing electrodes or batteries If the electrodes lose stickiness add a drop of water to the electrodes after they are disconnected from the unit and place on plastic sheet. If you continued to have difficulty, call the TENS unit company to purchase more electrodes. Do not apply lotion on the skin area prior to use. Make sure the skin is clean and dry as this will help prolong the life of the electrodes. After use, always check skin for unusual red areas, rash or other skin difficulties. If there are any skin problems, does not apply electrodes to the same area. Never remove the electrodes from the unit by pulling the wires. Do not use the TENS unit or electrodes other than as directed. Do not change electrode placement without consultating your therapist or physician. Keep 2 fingers with between each electrode. Wear time ratio is 2:1, on to off times.    For example on for 30 minutes off for 15 minutes and then on for 30 minutes off for 15 minutes   Cervical Strain and Sprain With Rehab Cervical strain and sprain are injuries that commonly occur with "whiplash" injuries. Whiplash occurs when the neck is forcefully whipped backward or forward, such as during a motor vehicle accident or during contact sports. The muscles, ligaments, tendons, discs, and nerves of the neck are susceptible to injury when this occurs. RISK FACTORS Risk of having a whiplash injury increases if:  Osteoarthritis of the spine.  Situations that make head or neck accidents or trauma more likely.  High-risk sports (football, rugby, wrestling, hockey, auto racing,  gymnastics, diving, contact karate, or boxing).  Poor strength and flexibility of the neck.  Previous neck injury.  Poor tackling technique.  Improperly fitted or padded equipment. SYMPTOMS   Pain or stiffness in the front or back of neck or both.  Symptoms may present immediately or up to 24 hours after injury.  Dizziness, headache, nausea, and vomiting.  Muscle spasm with soreness and stiffness in the neck.  Tenderness and swelling at the injury site. PREVENTION  Learn and use proper technique (avoid tackling with the head, spearing, and head-butting; use proper falling techniques to avoid landing on the head).  Warm up and stretch properly before activity.  Maintain physical fitness:  Strength, flexibility, and endurance.  Cardiovascular fitness.  Wear properly fitted and padded protective equipment, such as padded soft collars, for participation in contact sports. PROGNOSIS  Recovery from cervical strain and sprain injuries is dependent on the extent of the injury. These injuries are usually curable in 1 week to 3 months with appropriate treatment.  RELATED COMPLICATIONS   Temporary numbness and weakness may occur if the nerve roots are damaged, and this may persist until the nerve has completely healed.  Chronic pain due to frequent recurrence of symptoms.  Prolonged healing, especially if activity is resumed too soon (before complete recovery). TREATMENT  Treatment initially involves the use of ice and medication to help reduce pain and inflammation. It is also important to perform strengthening and stretching exercises and modify activities that worsen symptoms so the  injury does not get worse. These exercises may be performed at home or with a therapist. For patients who experience severe symptoms, a soft, padded collar may be recommended to be worn around the neck.  Improving your posture may help reduce symptoms. Posture improvement includes pulling your chin and  abdomen in while sitting or standing. If you are sitting, sit in a firm chair with your buttocks against the back of the chair. While sleeping, try replacing your pillow with a small towel rolled to 2 inches in diameter, or use a cervical pillow or soft cervical collar. Poor sleeping positions delay healing.  For patients with nerve root damage, which causes numbness or weakness, the use of a cervical traction apparatus may be recommended. Surgery is rarely necessary for these injuries. However, cervical strain and sprains that are present at birth (congenital) may require surgery. MEDICATION   If pain medication is necessary, nonsteroidal anti-inflammatory medications, such as aspirin and ibuprofen, or other minor pain relievers, such as acetaminophen, are often recommended.  Do not take pain medication for 7 days before surgery.  Prescription pain relievers may be given if deemed necessary by your caregiver. Use only as directed and only as much as you need. HEAT AND COLD:   Cold treatment (icing) relieves pain and reduces inflammation. Cold treatment should be applied for 10 to 15 minutes every 2 to 3 hours for inflammation and pain and immediately after any activity that aggravates your symptoms. Use ice packs or an ice massage.  Heat treatment may be used prior to performing the stretching and strengthening activities prescribed by your caregiver, physical therapist, or athletic trainer. Use a heat pack or a warm soak. SEEK MEDICAL CARE IF:   Symptoms get worse or do not improve in 2 weeks despite treatment.  New, unexplained symptoms develop (drugs used in treatment may produce side effects). EXERCISES RANGE OF MOTION (ROM) AND STRETCHING EXERCISES - Cervical Strain and Sprain These exercises may help you when beginning to rehabilitate your injury. In order to successfully resolve your symptoms, you must improve your posture. These exercises are designed to help reduce the forward-head and  rounded-shoulder posture which contributes to this condition. Your symptoms may resolve with or without further involvement from your physician, physical therapist or athletic trainer. While completing these exercises, remember:   Restoring tissue flexibility helps normal motion to return to the joints. This allows healthier, less painful movement and activity.  An effective stretch should be held for at least 20 seconds, although you may need to begin with shorter hold times for comfort.  A stretch should never be painful. You should only feel a gentle lengthening or release in the stretched tissue. STRETCH- Axial Extensors  Lie on your back on the floor. You may bend your knees for comfort. Place a rolled-up hand towel or dish towel, about 2 inches in diameter, under the part of your head that makes contact with the floor.  Gently tuck your chin, as if trying to make a "double chin," until you feel a gentle stretch at the base of your head.  Hold __________ seconds. Repeat __________ times. Complete this exercise __________ times per day.  STRETCH - Axial Extension   Stand or sit on a firm surface. Assume a good posture: chest up, shoulders drawn back, abdominal muscles slightly tense, knees unlocked (if standing) and feet hip width apart.  Slowly retract your chin so your head slides back and your chin slightly lowers. Continue to look straight ahead.  You should feel a gentle stretch in the back of your head. Be certain not to feel an aggressive stretch since this can cause headaches later.  Hold for __________ seconds. Repeat __________ times. Complete this exercise __________ times per day. STRETCH - Cervical Side Bend   Stand or sit on a firm surface. Assume a good posture: chest up, shoulders drawn back, abdominal muscles slightly tense, knees unlocked (if standing) and feet hip width apart.  Without letting your nose or shoulders move, slowly tip your right / left ear to your  shoulder until your feel a gentle stretch in the muscles on the opposite side of your neck.  Hold __________ seconds. Repeat __________ times. Complete this exercise __________ times per day. STRETCH - Cervical Rotators   Stand or sit on a firm surface. Assume a good posture: chest up, shoulders drawn back, abdominal muscles slightly tense, knees unlocked (if standing) and feet hip width apart.  Keeping your eyes level with the ground, slowly turn your head until you feel a gentle stretch along the back and opposite side of your neck.  Hold __________ seconds. Repeat __________ times. Complete this exercise __________ times per day. RANGE OF MOTION - Neck Circles   Stand or sit on a firm surface. Assume a good posture: chest up, shoulders drawn back, abdominal muscles slightly tense, knees unlocked (if standing) and feet hip width apart.  Gently roll your head down and around from the back of one shoulder to the back of the other. The motion should never be forced or painful.  Repeat the motion 10-20 times, or until you feel the neck muscles relax and loosen. Repeat __________ times. Complete the exercise __________ times per day. STRENGTHENING EXERCISES - Cervical Strain and Sprain These exercises may help you when beginning to rehabilitate your injury. They may resolve your symptoms with or without further involvement from your physician, physical therapist, or athletic trainer. While completing these exercises, remember:   Muscles can gain both the endurance and the strength needed for everyday activities through controlled exercises.  Complete these exercises as instructed by your physician, physical therapist, or athletic trainer. Progress the resistance and repetitions only as guided.  You may experience muscle soreness or fatigue, but the pain or discomfort you are trying to eliminate should never worsen during these exercises. If this pain does worsen, stop and make certain you are  following the directions exactly. If the pain is still present after adjustments, discontinue the exercise until you can discuss the trouble with your clinician. STRENGTH - Cervical Flexors, Isometric  Face a wall, standing about 6 inches away. Place a small pillow, a ball about 6-8 inches in diameter, or a folded towel between your forehead and the wall.  Slightly tuck your chin and gently push your forehead into the soft object. Push only with mild to moderate intensity, building up tension gradually. Keep your jaw and forehead relaxed.  Hold 10 to 20 seconds. Keep your breathing relaxed.  Release the tension slowly. Relax your neck muscles completely before you start the next repetition. Repeat __________ times. Complete this exercise __________ times per day. STRENGTH- Cervical Lateral Flexors, Isometric   Stand about 6 inches away from a wall. Place a small pillow, a ball about 6-8 inches in diameter, or a folded towel between the side of your head and the wall.  Slightly tuck your chin and gently tilt your head into the soft object. Push only with mild to moderate intensity, building up tension gradually.  Keep your jaw and forehead relaxed.  Hold 10 to 20 seconds. Keep your breathing relaxed.  Release the tension slowly. Relax your neck muscles completely before you start the next repetition. Repeat __________ times. Complete this exercise __________ times per day. STRENGTH - Cervical Extensors, Isometric   Stand about 6 inches away from a wall. Place a small pillow, a ball about 6-8 inches in diameter, or a folded towel between the back of your head and the wall.  Slightly tuck your chin and gently tilt your head back into the soft object. Push only with mild to moderate intensity, building up tension gradually. Keep your jaw and forehead relaxed.  Hold 10 to 20 seconds. Keep your breathing relaxed.  Release the tension slowly. Relax your neck muscles completely before you start  the next repetition. Repeat __________ times. Complete this exercise __________ times per day. POSTURE AND BODY MECHANICS CONSIDERATIONS - Cervical Strain and Sprain Keeping correct posture when sitting, standing or completing your activities will reduce the stress put on different body tissues, allowing injured tissues a chance to heal and limiting painful experiences. The following are general guidelines for improved posture. Your physician or physical therapist will provide you with any instructions specific to your needs. While reading these guidelines, remember:  The exercises prescribed by your provider will help you have the flexibility and strength to maintain correct postures.  The correct posture provides the optimal environment for your joints to work. All of your joints have less wear and tear when properly supported by a spine with good posture. This means you will experience a healthier, less painful body.  Correct posture must be practiced with all of your activities, especially prolonged sitting and standing. Correct posture is as important when doing repetitive low-stress activities (typing) as it is when doing a single heavy-load activity (lifting). PROLONGED STANDING WHILE SLIGHTLY LEANING FORWARD When completing a task that requires you to lean forward while standing in one place for a long time, place either foot up on a stationary 2- to 4-inch high object to help maintain the best posture. When both feet are on the ground, the low back tends to lose its slight inward curve. If this curve flattens (or becomes too large), then the back and your other joints will experience too much stress, fatigue more quickly, and can cause pain.  RESTING POSITIONS Consider which positions are most painful for you when choosing a resting position. If you have pain with flexion-based activities (sitting, bending, stooping, squatting), choose a position that allows you to rest in a less flexed  posture. You would want to avoid curling into a fetal position on your side. If your pain worsens with extension-based activities (prolonged standing, working overhead), avoid resting in an extended position such as sleeping on your stomach. Most people will find more comfort when they rest with their spine in a more neutral position, neither too rounded nor too arched. Lying on a non-sagging bed on your side with a pillow between your knees, or on your back with a pillow under your knees will often provide some relief. Keep in mind, being in any one position for a prolonged period of time, no matter how correct your posture, can still lead to stiffness. WALKING Walk with an upright posture. Your ears, shoulders, and hips should all line up. OFFICE WORK When working at a desk, create an environment that supports good, upright posture. Without extra support, muscles fatigue and lead to excessive strain on joints and other  tissues. CHAIR:  A chair should be able to slide under your desk when your back makes contact with the back of the chair. This allows you to work closely.  The chair's height should allow your eyes to be level with the upper part of your monitor and your hands to be slightly lower than your elbows.  Body position:  Your feet should make contact with the floor. If this is not possible, use a foot rest.  Keep your ears over your shoulders. This will reduce stress on your neck and low back.   This information is not intended to replace advice given to you by your health care provider. Make sure you discuss any questions you have with your health care provider.   Document Released: 06/10/2005 Document Revised: 07/01/2014 Document Reviewed: 09/22/2008 Elsevier Interactive Patient Education [employer].

## 2015-10-14 ENCOUNTER — Emergency Department (INDEPENDENT_AMBULATORY_CARE_PROVIDER_SITE_OTHER)
Admission: EM | Admit: 2015-10-14 | Discharge: 2015-10-14 | Disposition: A | Discharge status: Home / Self Care | Payer: BLUE CROSS/BLUE SHIELD | Source: Home / Self Care | Attending: Family Medicine | Admitting: Family Medicine

## 2015-10-14 ENCOUNTER — Encounter: Payer: Self-pay | Admitting: Emergency Medicine

## 2015-10-14 DIAGNOSIS — R040 Epistaxis: Secondary | ICD-10-CM | POA: Diagnosis not present

## 2015-10-14 LAB — POCT CBC W AUTO DIFF (K'VILLE URGENT CARE)

## 2015-10-14 MED ORDER — SALINE SPRAY 0.65 % NA SOLN: 1.0000 | NASAL | Status: DC | PRN

## 2015-10-14 MED ORDER — FLUTICASONE PROPIONATE 50 MCG/ACT NA SUSP: 2.0000 | Freq: Every day | NASAL | Status: DC

## 2015-10-14 MED ORDER — OXYMETAZOLINE HCL 0.05 % NA SOLN
1.0000 | Freq: Two times a day (BID) | NASAL | Status: DC | PRN
Start: 2015-10-14 — End: 2016-11-01

## 2015-10-14 NOTE — ED Notes (Signed)
Pt c/o 2 episodes of epistaxis in the last two days. States they last around 2 hours and heavy flow that will not stop. Pt has applied ice and pressure with no relief. Denies other sxs.

## 2015-10-14 NOTE — ED Provider Notes (Signed)
CSN: [id number]     Arrival date & time 10/14/15  5631 History   First MD Initiated Contact with Patient 10/14/15 [phone]     Chief Complaint  Patient presents with  . Epistaxis   (Consider location/radiation/quality/duration/timing/severity/associated sxs/prior Treatment) HPI The pt is a 65yo female presenting to [facility] with c/o 2 episodes of moderate to severe nose bleeds over the last 2 days.  Pt states the nose bleeds last around 2 hours and eventually stop with pt using ice and pressure to her nose.  First episode started 2 days ago when she was getting ready for work.  This morning she had another episode and was getting ready for the day.  She has had nose bleeds in the past and was advised by her PCP, [name], to monitor the nose bleeds and see a specialist if nose bleeds persists.  She has not had any trouble again since Thursday. Denies increased stress or change in medications.  She does take '81mg'$  Aspirin daily but has been doing this for several years.  Reports hx of allergies but only in the Fall.  She does not feel congested.  She did had a mild headache prior to onset of the nose bleeds. Denies dizziness. Denies facial trauma.   Past Medical History  Diagnosis Date  . Asthma   . Anxiety   . Depression   . Allergy   . Breast cancer (Cataract) 2009  . Postmenopausal   . Thyroid disease    Past Surgical History  Procedure Laterality Date  . Mastectomy  2009    BrCa  . Breast reconstruction  06/2008  . Adenoidectomy    . Tonsillectomy     Family History  Problem Relation Age of Onset  . Stroke Mother   . Cancer Mother     colon?  [name] Hypertension Father   . Hyperlipidemia Father    Social History  Substance Use Topics  . Smoking status: Former Smoker    Quit date: 06/25/2011  . Smokeless tobacco: None  . Alcohol Use: No   OB History    No data available     Review of Systems  Constitutional: Negative for fever and chills.  HENT: Positive for nosebleeds.  Negative for congestion, rhinorrhea and sinus pressure.   Gastrointestinal: Negative for nausea and vomiting.  Neurological: Negative for dizziness, syncope, light-headedness and headaches.    Allergies  Elavil; Macrodantin; Red dye; Sulfa antibiotics; and Penicillins  Home Medications   Prior to Admission medications   Medication Sig Start Date End Date Taking? Authorizing Provider  albuterol (PROAIR HFA) 108 (90 BASE) MCG/ACT inhaler Inhale 2-4 puffs into the lungs every 6 (six) hours as needed for wheezing or shortness of breath. 09/22/14   Hali Marry, MD  AMBULATORY NON FORMULARY MEDICATION daily. Alive multivitamin    Historical Provider, MD  aspirin 81 MG tablet Take 81 mg by mouth daily.    Historical Provider, MD  atorvastatin (LIPITOR) 40 MG tablet TAKE ONE TABLET BY MOUTH ONCE DAILY 09/05/15   [name] L Breeback, PA-C  Cholecalciferol (VITAMIN D PO) Take 2,000 Int'l Units by mouth daily.    Historical Provider, MD  clonazePAM (KLONOPIN) 0.5 MG tablet Take 1 tablet (0.5 mg total) by mouth 2 (two) times daily as needed for anxiety. 04/26/15   [name] L Breeback, PA-C  dicyclomine (BENTYL) 10 MG capsule TAKE ONE CAPSULE BY MOUTH 4 TIMES DAILY (BEFORE MEALS AND AT BEDTIME) 09/29/15   [name] L Breeback, PA-C  escitalopram (  LEXAPRO) 20 MG tablet Take 1 tablet (20 mg total) by mouth daily. 06/06/15   [name] L Breeback, PA-C  fluticasone (FLONASE) 50 MCG/ACT nasal spray Place 2 sprays into both nostrils daily. 10/14/15   Noland Fordyce, PA-C  levothyroxine (SYNTHROID, LEVOTHROID) 112 MCG tablet Take 1 tablet (112 mcg total) by mouth daily. 04/26/15   [name] L Breeback, PA-C  oxybutynin (DITROPAN) 5 MG tablet Take 1 tablet (5 mg total) by mouth 2 (two) times daily. 04/26/15   [name] L Breeback, PA-C  oxymetazoline (AFRIN NASAL SPRAY) 0.05 % nasal spray Place 1 spray into both nostrils 2 (two) times daily as needed. As needed for nose bleeds 10/14/15   Noland Fordyce, PA-C  predniSONE (DELTASONE) 50 MG  tablet Take one tablet for 5 days. 07/24/15   [name] L Breeback, PA-C  sodium chloride (OCEAN) 0.65 % SOLN nasal spray Place 1 spray into both nostrils as needed. 10/14/15   Noland Fordyce, PA-C  temazepam (RESTORIL) 30 MG capsule Take 1 capsule (30 mg total) by mouth at bedtime as needed for sleep. 04/26/15   [name] L Breeback, PA-C  traMADol (ULTRAM) 50 MG tablet Take 1 tablet (50 mg total) by mouth every 8 (eight) hours as needed. 08/28/15   Gregor Hams, MD   Meds Ordered and Administered this Visit  Medications - No data to display  BP 139/70 mmHg  Pulse 64  Temp(Src) 98.3 F (36.8 C) (Oral)  Wt 183 lb (83.008 kg)  SpO2 97%  LMP 06/24/2000 No data found.   Physical Exam  Constitutional: She is oriented to person, place, and time. She appears well-developed and well-nourished.  HENT:  Head: Normocephalic and atraumatic.  Right Ear: Tympanic membrane normal.  Left Ear: Tympanic membrane normal.  Nose: Nose normal. No mucosal edema, sinus tenderness, nasal deformity, septal deviation or nasal septal hematoma. No epistaxis. Right sinus exhibits no maxillary sinus tenderness and no frontal sinus tenderness. Left sinus exhibits no maxillary sinus tenderness and no frontal sinus tenderness.  Mouth/Throat: Uvula is midline, oropharynx is clear and moist and mucous membranes are normal.  Eyes: EOM are normal.  Neck: Normal range of motion. Neck supple.  Cardiovascular: Normal rate, regular rhythm and normal heart sounds.   Pulmonary/Chest: Effort normal and breath sounds normal. No respiratory distress. She has no wheezes. She has no rales.  Musculoskeletal: Normal range of motion.  Neurological: She is alert and oriented to person, place, and time.  Skin: Skin is warm and dry.  Psychiatric: She has a normal mood and affect. Her behavior is normal.  Nursing note and vitals reviewed.   ED Course  Procedures (including critical care time)  Labs Review Labs Reviewed  POCT CBC W AUTO DIFF  ([place])    Imaging Review No results found.   MDM   1. Epistaxis, recurrent    Pt c/o 2 episodes of moderate to severe nose bleeds over the last 2 days.  Normal exam. BP 139/70.  Pt appears well otherwise.  CBC: normal   Reassured pt of normal exam.  Advised she may use Afrin if nose starts to bleed again. Flonase daily to help with nasal congestion and inflammation, and nasal saline if her sinuses feel dried out.   Encouraged to f/u with PCP as needed for recurrent nose bleeds. May need referral to ENT.  Discussed symptoms that warrant emergent care in the ED. Patient verbalized understanding and agreement with treatment plan.     Noland Fordyce, PA-C 10/14/15 1047

## 2015-10-14 NOTE — Discharge Instructions (Signed)
Nosebleed  Nosebleeds are common. A nosebleed can be caused by many things, including:  · Getting hit hard in the nose.  · Infections.  · Dryness in your nose.  · A dry climate.  · Medicines.  · Picking your nose.  · Your home heating and cooling systems.  HOME CARE   · Try controlling your nosebleed by pinching your nostrils gently. Do this for at least 10 minutes.  · Avoid blowing or sniffing your nose for a number of hours after having a nosebleed.  · Do not put gauze inside of your nose yourself. If your nose was packed by your doctor, try to keep the pack inside of your nose until your doctor removes it.    If a gauze pack was used and it starts to fall out, gently replace it or cut off the end of it.    If a balloon catheter was used to pack your nose, do not cut or remove it unless told by your doctor.  · Avoid lying down while you are having a nosebleed. Sit up and lean forward.  · Use a nasal spray decongestant to help with a nosebleed as told by your doctor.  · Do not use petroleum jelly or mineral oil in your nose. These can drip into your lungs.  · Keep your house humid by using:    Less air conditioning.    A humidifier.  · Aspirin and blood thinners make bleeding more likely. If you are prescribed these medicines and you have nosebleeds, ask your doctor if you should stop taking the medicines or adjust the dose. Do not stop medicines unless told by your doctor.  · Resume your normal activities as you are able. Avoid straining, lifting, or bending at your waist for several days.  · If your nosebleed was caused by dryness in your nose, use over-the-counter saline nasal spray or gel. If you must use a lubricant:    Choose one that is water-soluble.    Use it only as needed.    Do not use it within several hours of lying down.  · Keep all follow-up visits as told by your doctor. This is important.  GET HELP IF:  · You have a fever.  · You get frequent nosebleeds.  · You are getting nosebleeds more  often.  GET HELP RIGHT AWAY IF:  · Your nosebleed lasts longer than 20 minutes.  · Your nosebleed occurs after an injury to your face, and your nose looks crooked or broken.  · You have unusual bleeding from other parts of your body.  · You have unusual bruising on other parts of your body.  · You feel light-headed or dizzy.  · You become sweaty.  · You throw up (vomit) blood.  · You have a nosebleed after a head injury.     This information is not intended to replace advice given to you by your health care provider. Make sure you discuss any questions you have with your health care provider.     Document Released: 03/19/2008 Document Revised: 07/01/2014 Document Reviewed: 01/24/2014  Elsevier Interactive Patient Education ©2016 Elsevier Inc.

## 2015-10-30 ENCOUNTER — Ambulatory Visit: Payer: BLUE CROSS/BLUE SHIELD | Admitting: Family Medicine

## 2015-11-08 ENCOUNTER — Other Ambulatory Visit: Payer: Self-pay | Admitting: Physician Assistant

## 2015-11-13 ENCOUNTER — Telehealth: Payer: Self-pay

## 2015-11-13 NOTE — Telephone Encounter (Signed)
Can write out of work for today. Stop ASA.

## 2015-11-13 NOTE — Telephone Encounter (Signed)
Notified patient that [name] wants her to stop taking her ASA daily.  Will have work note for today, and she will pick up at office visit tomorrow.

## 2015-11-14 ENCOUNTER — Encounter: Payer: Self-pay | Admitting: Physician Assistant

## 2015-11-14 ENCOUNTER — Ambulatory Visit (INDEPENDENT_AMBULATORY_CARE_PROVIDER_SITE_OTHER): Payer: BLUE CROSS/BLUE SHIELD | Admitting: Physician Assistant

## 2015-11-14 VITALS — BP 156/54 | HR 78 | Ht 63.0 in | Wt 185.0 lb

## 2015-11-14 DIAGNOSIS — F411 Generalized anxiety disorder: Secondary | ICD-10-CM | POA: Diagnosis not present

## 2015-11-14 DIAGNOSIS — R040 Epistaxis: Secondary | ICD-10-CM

## 2015-11-14 DIAGNOSIS — F418 Other specified anxiety disorders: Secondary | ICD-10-CM | POA: Diagnosis not present

## 2015-11-14 DIAGNOSIS — E785 Hyperlipidemia, unspecified: Secondary | ICD-10-CM | POA: Diagnosis not present

## 2015-11-14 DIAGNOSIS — Z79899 Other long term (current) drug therapy: Secondary | ICD-10-CM

## 2015-11-14 DIAGNOSIS — F329 Major depressive disorder, single episode, unspecified: Secondary | ICD-10-CM

## 2015-11-14 DIAGNOSIS — F32A Depression, unspecified: Secondary | ICD-10-CM

## 2015-11-14 DIAGNOSIS — F419 Anxiety disorder, unspecified: Secondary | ICD-10-CM

## 2015-11-14 MED ORDER — ESCITALOPRAM OXALATE 20 MG PO TABS: 20.0000 mg | ORAL_TABLET | Freq: Every day | ORAL | Status: DC

## 2015-11-14 MED ORDER — DICYCLOMINE HCL 10 MG PO CAPS: 10.0000 mg | ORAL_CAPSULE | Freq: Two times a day (BID) | ORAL | Status: DC

## 2015-11-14 MED ORDER — OXYBUTYNIN CHLORIDE 5 MG PO TABS: 5.0000 mg | ORAL_TABLET | Freq: Two times a day (BID) | ORAL | Status: DC

## 2015-11-14 MED ORDER — CLONAZEPAM 0.5 MG PO TABS: 0.5000 mg | ORAL_TABLET | Freq: Two times a day (BID) | ORAL | Status: DC | PRN

## 2015-11-14 NOTE — Patient Instructions (Addendum)
AYR nasal gel/solution regularly at least daily even twice.  Afrin as needed to stop nose bleed.  Stop ASA for now.   Nosebleed Nosebleeds are common. They are due to a crack in the inside lining of your nose (mucous membrane) or from a small blood vessel that starts to bleed. Nosebleeds can be caused by many conditions, such as injury, infections, dry mucous membranes or dry climate, medicines, nose picking, and home heating and cooling systems. Most nosebleeds come from blood vessels in the front of your nose. HOME CARE INSTRUCTIONS   Try controlling your nosebleed by pinching your nostrils gently and continuously for at least 10 minutes.  Avoid blowing or sniffing your nose for a number of hours after having a nosebleed.  Do not put gauze inside your nose yourself. If your nose was packed by your health care provider, try to maintain the pack inside of your nose until your health care provider removes it.  If a gauze pack was used and it starts to fall out, gently replace it or cut off the end of it.  If a balloon catheter was used to pack your nose, do not cut or remove it unless your health care provider has instructed you to do that.  Avoid lying down while you are having a nosebleed. Sit up and lean forward.  Use a nasal spray decongestant to help with a nosebleed as directed by your health care provider.  Do not use petroleum jelly or mineral oil in your nose. These can drip into your lungs.  Maintain humidity in your home by using less air conditioning or by using a humidifier.  Aspirinand blood thinners make bleeding more likely. If you are prescribed these medicines and you suffer from nosebleeds, ask your health care provider if you should stop taking the medicines or adjust the dose. Do not stop medicines unless directed by your health care provider  Resume your normal activities as you are able, but avoid straining, lifting, or bending at the waist for several days.  If  your nosebleed was caused by dry mucous membranes, use over-the-counter saline nasal spray or gel. This will keep the mucous membranes moist and allow them to heal. If you must use a lubricant, choose the water-soluble variety. Use it only sparingly, and do not use it within several hours of lying down.  Keep all follow-up visits as directed by your health care provider. This is important. SEEK MEDICAL CARE IF:  You have a fever.  You get frequent nosebleeds.  You are getting nosebleeds more often. SEEK IMMEDIATE MEDICAL CARE IF:  Your nosebleed lasts longer than 20 minutes.  Your nosebleed occurs after an injury to your face, and your nose looks crooked or broken.  You have unusual bleeding from other parts of your body.  You have unusual bruising on other parts of your body.  You feel light-headed or you faint.  You become sweaty.  You vomit blood.  Your nosebleed occurs after a head injury.   This information is not intended to replace advice given to you by your health care provider. Make sure you discuss any questions you have with your health care provider.   Document Released: 03/20/2005 Document Revised: 07/01/2014 Document Reviewed: 01/24/2014 Elsevier Interactive Patient Education [employer].

## 2015-11-14 NOTE — Progress Notes (Signed)
   Subjective:    Patient ID: Angelica [name], female    DOB: [birth year], 65 y.o.   MRN: [id number]  HPI  Pt would like to get refills and discuss nosebleeds.   Pt is having nose bleeds on right side only for last few weeks. Able to stop bleeding within  10 minutes. She did go to [facility] a few days ago because she had 2 in a row. 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 She is very anxious about this. She is on ASA. She is not on allergy tablets.    Review of Systems See HPI.     Objective:   Physical Exam  Constitutional: She is oriented to person, place, and time. She appears well-developed and well-nourished.  HENT:  Head: Normocephalic and atraumatic.  Nose: Nose normal.  Mouth/Throat: Oropharynx is clear and moist. No oropharyngeal exudate.  Eyes: Conjunctivae are normal.  Cardiovascular: Normal rate, regular rhythm and normal heart sounds.   Pulmonary/Chest: Effort normal and breath sounds normal.  Neurological: She is alert and oriented to person, place, and time.  Psychiatric: She has a normal mood and affect. Her behavior is normal.          Assessment & Plan:  Epistaxis- stop ASA. Discussed afrin as needed. AYR nasal gel 3-4 times a day. Tampon to stop bleeding. Follow up as needed.   Anxiety and depression- PHQ-9 was 11 And GAD-7 was 18. Will not make any changes today. I think a lot of her anxiety is coming from fear of losing her job over nose bleeds. Reassured her today. Continue klonapin.   Hyperlipidemia- not checked since started lipitor. Lipid panel ordered. Continue lipitor.

## 2015-11-18 LAB — COMPLETE METABOLIC PANEL WITH GFR
ALBUMIN: 4 g/dL (ref 3.6–5.1)
ALK PHOS: 99 U/L (ref 33–130)
ALT: 18 U/L (ref 6–29)
AST: 22 U/L (ref 10–35)
BUN: 6 mg/dL — AB (ref 7–25)
CALCIUM: 8.7 mg/dL (ref 8.6–10.4)
CHLORIDE: 106 mmol/L (ref 98–110)
CO2: 23 mmol/L (ref 20–31)
CREATININE: 0.63 mg/dL (ref 0.50–0.99)
GFR, Est Non African American: >89 mL/min (ref >=60)
Glucose, Bld: 82 mg/dL (ref 65–99)
Potassium: 3.7 mmol/L (ref 3.5–5.3)
Sodium: 143 mmol/L (ref 135–146)
Total Bilirubin: 0.8 mg/dL (ref 0.2–1.2)
Total Protein: 6.3 g/dL (ref 6.1–8.1)

## 2015-11-18 LAB — LIPID PANEL
CHOL/HDL RATIO: 4.1 ratio (ref <=5.0)
CHOLESTEROL: 152 mg/dL (ref 125–200)
HDL: 37 mg/dL — ABNORMAL LOW (ref >=46)
LDL Cholesterol: 70 mg/dL (ref <130)
TRIGLYCERIDES: 226 mg/dL — AB (ref <150)
VLDL: 45 mg/dL — AB (ref <30)

## 2015-12-15 ENCOUNTER — Ambulatory Visit (INDEPENDENT_AMBULATORY_CARE_PROVIDER_SITE_OTHER): Payer: BLUE CROSS/BLUE SHIELD | Admitting: Physician Assistant

## 2015-12-15 ENCOUNTER — Encounter: Payer: Self-pay | Admitting: Physician Assistant

## 2015-12-15 VITALS — BP 132/67 | HR 76 | Temp 98.8°F | Ht 63.0 in | Wt 181.0 lb

## 2015-12-15 DIAGNOSIS — H109 Unspecified conjunctivitis: Secondary | ICD-10-CM

## 2015-12-15 DIAGNOSIS — H1089 Other conjunctivitis: Secondary | ICD-10-CM | POA: Diagnosis not present

## 2015-12-15 DIAGNOSIS — A499 Bacterial infection, unspecified: Secondary | ICD-10-CM | POA: Diagnosis not present

## 2015-12-15 DIAGNOSIS — J069 Acute upper respiratory infection, unspecified: Secondary | ICD-10-CM | POA: Diagnosis not present

## 2015-12-15 MED ORDER — POLYMYXIN B-TRIMETHOPRIM 10000-0.1 UNIT/ML-% OP SOLN: 1.0000 [drp] | OPHTHALMIC | Status: DC

## 2015-12-15 MED ORDER — AZITHROMYCIN 250 MG PO TABS: ORAL_TABLET | ORAL | Status: DC

## 2015-12-15 NOTE — Progress Notes (Signed)
   Subjective:    Patient ID: Angelica [name], female    DOB: November 23, 1950, 65 y.o.   MRN: [id number]  HPI Pt is a 65 yo female who presents to the clinic with 5 days of sinus pressure, nasal congestion, left eye pain and itching with discharge. This morning left eye was matted shut with yellow discharge. No fever, chills, body aches. She does have a dry cough. She has tried zyrtec, rhinocort, sudafed without relief.    Review of Systems  All other systems reviewed and are negative.      Objective:   Physical Exam  Constitutional: She is oriented to person, place, and time. She appears well-developed and well-nourished.  HENT:  Head: Normocephalic and atraumatic.  Right Ear: External ear normal.  Left Ear: External ear normal.  Mouth/Throat: Oropharynx is clear and moist. No oropharyngeal exudate.  TM's clear.  Tenderness to palpation around left eye and maxillary region.  Bilateral nasal turbinates red and swollen.   Eyes: Right eye exhibits no discharge. Left eye exhibits discharge.  Injected conjunctiva left eye with clear, thick discharge.   Neck: Normal range of motion. Neck supple.  Cardiovascular: Normal rate, regular rhythm and normal heart sounds.   Pulmonary/Chest: Effort normal and breath sounds normal. She has no wheezes.  Lymphadenopathy:    She has no cervical adenopathy.  Neurological: She is alert and oriented to person, place, and time.  Psychiatric: She has a normal mood and affect. Her behavior is normal.          Assessment & Plan:  Acute upper respiratory infection/bacterial conjunctivitis left eye- treated with zpak due to PCN allergy. polytrim given for 7 days for left eye. Symptomatic care with HO discussed. Follow up as needed. Written out of work for the weekend.

## 2016-01-12 ENCOUNTER — Other Ambulatory Visit: Payer: Self-pay | Admitting: Physician Assistant

## 2016-01-12 DIAGNOSIS — Z1231 Encounter for screening mammogram for malignant neoplasm of breast: Secondary | ICD-10-CM

## 2016-01-12 DIAGNOSIS — Z9011 Acquired absence of right breast and nipple: Secondary | ICD-10-CM

## 2016-01-23 ENCOUNTER — Ambulatory Visit
Admission: RE | Admit: 2016-01-23 | Discharge: 2016-01-23 | Disposition: A | Discharge status: Home / Self Care | Payer: BLUE CROSS/BLUE SHIELD | Source: Ambulatory Visit | Attending: Physician Assistant | Admitting: Physician Assistant

## 2016-01-23 DIAGNOSIS — Z1231 Encounter for screening mammogram for malignant neoplasm of breast: Secondary | ICD-10-CM

## 2016-01-23 DIAGNOSIS — Z9011 Acquired absence of right breast and nipple: Secondary | ICD-10-CM

## 2016-03-18 ENCOUNTER — Other Ambulatory Visit: Payer: Self-pay | Admitting: *Deleted

## 2016-03-18 MED ORDER — DICYCLOMINE HCL 10 MG PO CAPS: 10.0000 mg | ORAL_CAPSULE | Freq: Two times a day (BID) | ORAL | 1 refills | Status: DC

## 2016-05-14 ENCOUNTER — Encounter: Payer: Self-pay | Admitting: Physician Assistant

## 2016-05-14 ENCOUNTER — Other Ambulatory Visit: Payer: Self-pay | Admitting: Physician Assistant

## 2016-05-14 ENCOUNTER — Ambulatory Visit (INDEPENDENT_AMBULATORY_CARE_PROVIDER_SITE_OTHER): Payer: Medicare Other | Admitting: Physician Assistant

## 2016-05-14 ENCOUNTER — Telehealth: Payer: Self-pay | Admitting: Physician Assistant

## 2016-05-14 VITALS — BP 156/80 | HR 66 | Ht 63.0 in | Wt 182.0 lb

## 2016-05-14 DIAGNOSIS — F418 Other specified anxiety disorders: Secondary | ICD-10-CM

## 2016-05-14 DIAGNOSIS — F32A Depression, unspecified: Secondary | ICD-10-CM

## 2016-05-14 DIAGNOSIS — F329 Major depressive disorder, single episode, unspecified: Secondary | ICD-10-CM

## 2016-05-14 DIAGNOSIS — Z23 Encounter for immunization: Secondary | ICD-10-CM

## 2016-05-14 DIAGNOSIS — E038 Other specified hypothyroidism: Secondary | ICD-10-CM

## 2016-05-14 DIAGNOSIS — N3281 Overactive bladder: Secondary | ICD-10-CM

## 2016-05-14 DIAGNOSIS — F419 Anxiety disorder, unspecified: Secondary | ICD-10-CM

## 2016-05-14 DIAGNOSIS — R040 Epistaxis: Secondary | ICD-10-CM | POA: Diagnosis not present

## 2016-05-14 DIAGNOSIS — I10 Essential (primary) hypertension: Secondary | ICD-10-CM

## 2016-05-14 DIAGNOSIS — J4520 Mild intermittent asthma, uncomplicated: Secondary | ICD-10-CM

## 2016-05-14 MED ORDER — CLONAZEPAM 0.5 MG PO TABS: 0.5000 mg | ORAL_TABLET | Freq: Two times a day (BID) | ORAL | 5 refills | Status: DC | PRN

## 2016-05-14 MED ORDER — BUSPIRONE HCL 7.5 MG PO TABS: 7.5000 mg | ORAL_TABLET | Freq: Three times a day (TID) | ORAL | 0 refills | Status: DC

## 2016-05-14 MED ORDER — TEMAZEPAM 30 MG PO CAPS: 30.0000 mg | ORAL_CAPSULE | Freq: Every evening | ORAL | 1 refills | Status: DC | PRN

## 2016-05-14 MED ORDER — ESCITALOPRAM OXALATE 20 MG PO TABS: 20.0000 mg | ORAL_TABLET | Freq: Every day | ORAL | 1 refills | Status: DC

## 2016-05-14 MED ORDER — LISINOPRIL 10 MG PO TABS: 10.0000 mg | ORAL_TABLET | Freq: Every day | ORAL | 0 refills | Status: DC

## 2016-05-14 MED ORDER — ALBUTEROL SULFATE HFA 108 (90 BASE) MCG/ACT IN AERS: 2.0000 | INHALATION_SPRAY | Freq: Four times a day (QID) | RESPIRATORY_TRACT | 2 refills | Status: DC | PRN

## 2016-05-14 MED ORDER — MIRABEGRON ER 50 MG PO TB24: 50.0000 mg | ORAL_TABLET | Freq: Every day | ORAL | 3 refills | Status: DC

## 2016-05-14 NOTE — Telephone Encounter (Signed)
Medications should be much cheaper than that. Mention [facility] 4 dollar list and good rx to try.

## 2016-05-14 NOTE — Progress Notes (Addendum)
Subjective:    Patient ID: Angelica [name], female    DOB: March 28, 1951, 65 y.o.   MRN: [id number]  HPI Patient is a 65 yo female coming to the office for medication refills. Patient voiced that she would like to adjust her antidepressant due to feeling like the medication wears off at the end of the day. Patient states that she recently quit her job and is taking care of her father which has put some extra stress in her life. Patient denies homicidal and suicidal ideation.    Patient also is concerned about her blood pressure which was elevated at the office today (156/80) and patient also will check her blood pressure at [facility] and the machine tells her to come see her provider because her blood pressure is reading too high. Patient denies shortness of breath, chest pain or dizziness.   Patient also complains of frequent nose bleeds that started a few months ago. Her last nose bleed was yesterday. She states after a few minutes, she holds pressure on her nose and the bleeding stops. Patient also uses Afrin nasal spray after she gets a nose bleed. Patient denies sinus congestion or rhinorrhea. She does note that nose bleeds after starting oxybutynin.    Vitals:   05/14/16 1028 05/14/16 1420  BP: (!) 152/54 (!) 156/80  Pulse: 66    Review of Systems  HENT: Positive for nosebleeds. Negative for congestion, rhinorrhea and sinus pain.   Respiratory: Negative for cough, shortness of breath and wheezing.   Cardiovascular: Negative for chest pain and palpitations.  Psychiatric/Behavioral: Negative for confusion and suicidal ideas. The patient is nervous/anxious.        Objective:   Physical Exam  Constitutional: She is oriented to person, place, and time. She appears well-developed and well-nourished.  HENT:  No residual blood in nares bilaterally.   Cardiovascular: Normal rate and regular rhythm.  Exam reveals no gallop and no friction rub.   No murmur heard. Pulmonary/Chest: No  respiratory distress. She has no wheezes. She has no rales.  Neurological: She is alert and oriented to person, place, and time.          Assessment & Plan:  Diagnoses and all orders for this visit:  Influenza vaccine needed -     Flu Vaccine QUAD 36+ mos PF IM (Fluarix & Fluzone Quad PF)  Essential hypertension, benign -     lisinopril (PRINIVIL,ZESTRIL) 10 MG tablet; Take 1 tablet (10 mg total) by mouth daily.  Frequent nosebleeds  Anxiety and depression -     busPIRone (BUSPAR) 7.5 MG tablet; Take 1 tablet (7.5 mg total) by mouth 3 (three) times daily. -     clonazePAM (KLONOPIN) 0.5 MG tablet; Take 1 tablet (0.5 mg total) by mouth 2 (two) times daily as needed. for anxiety -     escitalopram (LEXAPRO) 20 MG tablet; Take 1 tablet (20 mg total) by mouth daily. -     temazepam (RESTORIL) 30 MG capsule; Take 1 capsule (30 mg total) by mouth at bedtime as needed for sleep.  Other specified hypothyroidism -     TSH  Mild intermittent asthma without complication -     albuterol (PROAIR HFA) 108 (90 Base) MCG/ACT inhaler; Inhale 2-4 puffs into the lungs every 6 (six) hours as needed for wheezing or shortness of breath.  OAB (overactive bladder) -     mirabegron ER (MYRBETRIQ) 50 MG TB24 tablet; Take 1 tablet (50 mg total) by mouth daily.  Other orders -  Discontinue: lisinopril (PRINIVIL,ZESTRIL) 10 MG tablet; Take 1 tablet (10 mg total) by mouth daily.  1. Patient received influenza vaccine today. 2. Patient started on lisinopril due to patients blood pressure being elevated in the past and today at the office at 156/80. Patient to follow-up for a nurse visit to re-check blood pressure in one month.  3. Patient experiencing nosebleeds most likely due to oxybutyin. If nosebleeds persist after discontinuing medication, then refer to ENT.  4. Added Buspar to help with anxiety increase. Continue taking lexapro and restoril. Take klonopin as needed for anxiety attacks.  5. Need to  re-check TSH to see if levothyroxine needs to be adjusted. 6. Refilled albuterol for asthma as needed. 7. Switched patient from oxybutynin to myrbetriq due to patient most likely experiencing nosebleeds from oxybutynin due to being an anticholingeric.

## 2016-05-14 NOTE — Progress Notes (Signed)
   Subjective:    Patient ID: Angelica [name], female    DOB: 1951/04/26, 65 y.o.   MRN: [id number]  HPI  Stop oxybutynin  Review of Systems     Objective:   Physical Exam        Assessment & Plan:

## 2016-05-14 NOTE — Patient Instructions (Signed)
Stop oxybutin you will start myrbetriq.

## 2016-05-14 NOTE — Telephone Encounter (Signed)
Patient called adv that the meds you called into are too expensive wouldn't cover. Do u have another or similar med for those two between the two it cost $300.00. [facility] adv our office would need to call them to cancel those meds and give a another brand or something similar. Thanks

## 2016-05-15 ENCOUNTER — Other Ambulatory Visit: Payer: Self-pay | Admitting: *Deleted

## 2016-05-15 LAB — TSH: TSH: 0.44 mIU/L

## 2016-05-15 MED ORDER — LEVOTHYROXINE SODIUM 112 MCG PO TABS: 112.0000 ug | ORAL_TABLET | Freq: Every day | ORAL | 1 refills | Status: DC

## 2016-05-15 NOTE — Telephone Encounter (Signed)
LMOM for pt to return call. 

## 2016-05-22 ENCOUNTER — Telehealth: Payer: Self-pay | Admitting: *Deleted

## 2016-05-22 NOTE — Telephone Encounter (Signed)
Nosebleeds can be linked to food intolerances. If chocolate causes this then stop and see if nosebleeds stop.

## 2016-05-22 NOTE — Telephone Encounter (Signed)
Pt left vm this morning to let you know that she's had a nose bleed every day since she saw you last week.  She stated that she gets one every time she eats chocolate and wanted to know if you've ever heard of this.  Please advise.

## 2016-05-23 NOTE — Telephone Encounter (Signed)
Pt reports that she had another nose bleed today.  She hasn't had any chocolate today so she don't believes that its the cause. She is wanting to go forward with the ENT referral she prefer it be an office in [place].

## 2016-05-24 ENCOUNTER — Other Ambulatory Visit: Payer: Self-pay | Admitting: Physician Assistant

## 2016-05-24 DIAGNOSIS — R040 Epistaxis: Secondary | ICD-10-CM

## 2016-05-24 NOTE — Telephone Encounter (Signed)
Referral has been placed. 

## 2016-05-24 NOTE — Telephone Encounter (Signed)
Pt.notified

## 2016-06-11 ENCOUNTER — Ambulatory Visit (INDEPENDENT_AMBULATORY_CARE_PROVIDER_SITE_OTHER): Payer: Medicare Other | Admitting: Physician Assistant

## 2016-06-11 ENCOUNTER — Encounter: Payer: Self-pay | Admitting: Physician Assistant

## 2016-06-11 VITALS — BP 143/77 | HR 80 | Ht 63.0 in | Wt 178.0 lb

## 2016-06-11 DIAGNOSIS — I10 Essential (primary) hypertension: Secondary | ICD-10-CM | POA: Diagnosis not present

## 2016-06-11 DIAGNOSIS — F411 Generalized anxiety disorder: Secondary | ICD-10-CM | POA: Diagnosis not present

## 2016-06-11 MED ORDER — LISINOPRIL 20 MG PO TABS: 20.0000 mg | ORAL_TABLET | Freq: Every day | ORAL | 0 refills | Status: DC

## 2016-06-11 MED ORDER — BUSPIRONE HCL 10 MG PO TABS: 10.0000 mg | ORAL_TABLET | Freq: Three times a day (TID) | ORAL | 0 refills | Status: DC

## 2016-06-11 NOTE — Progress Notes (Signed)
   Subjective:    Patient ID: Angelica [name], female    DOB: 03/24/1951, 65 y.o.   MRN: [id number]  HPI  Pt presents to the clinic to follow up on anxiety and HTN.   HTN- she is doing well on lisinopril 10mg . She denies any side effects. She has not had any headaches, CP, palptiations, SOB. She is checking [facility] at [facility] and running 150's/80 still.   Her anxiety has improved with buspar 7.5mg  TID. She still feels like it wears off though. She is short with her grandchildren in the afternoons. Overall anxiety is better. She continues on lexapro.   Review of Systems See HPI>     Objective:   Physical Exam  Constitutional: She is oriented to person, place, and time. She appears well-developed and well-nourished.  HENT:  Head: Normocephalic and atraumatic.  Cardiovascular: Normal rate, regular rhythm and normal heart sounds.   Pulmonary/Chest: Effort normal and breath sounds normal.  Neurological: She is alert and oriented to person, place, and time.  Psychiatric: She has a normal mood and affect. Her behavior is normal.          Assessment & Plan:  Marland KitchenMarland KitchenEverlei was seen today for hypertension.  Diagnoses and all orders for this visit:  GAD (generalized anxiety disorder) -     busPIRone (BUSPAR) 10 MG tablet; Take 1 tablet (10 mg total) by mouth 3 (three) times daily.  Essential hypertension, benign -     lisinopril (PRINIVIL,ZESTRIL) 20 MG tablet; Take 1 tablet (20 mg total) by mouth daily.   Increased buspar and lisinopril.  Follow up in 1 month.

## 2016-06-13 DIAGNOSIS — R040 Epistaxis: Secondary | ICD-10-CM | POA: Diagnosis not present

## 2016-06-13 DIAGNOSIS — D2339 Other benign neoplasm of skin of other parts of face: Secondary | ICD-10-CM | POA: Diagnosis not present

## 2016-07-11 ENCOUNTER — Other Ambulatory Visit: Payer: Self-pay | Admitting: Physician Assistant

## 2016-07-11 ENCOUNTER — Encounter: Payer: Self-pay | Admitting: Physician Assistant

## 2016-07-11 DIAGNOSIS — I10 Essential (primary) hypertension: Secondary | ICD-10-CM

## 2016-07-11 DIAGNOSIS — F411 Generalized anxiety disorder: Secondary | ICD-10-CM

## 2016-07-11 MED ORDER — LISINOPRIL 20 MG PO TABS: 20.0000 mg | ORAL_TABLET | Freq: Every day | ORAL | 0 refills | Status: DC

## 2016-07-11 MED ORDER — BUSPIRONE HCL 10 MG PO TABS: 10.0000 mg | ORAL_TABLET | Freq: Three times a day (TID) | ORAL | 0 refills | Status: DC

## 2016-07-18 ENCOUNTER — Telehealth: Payer: Self-pay | Admitting: Physician Assistant

## 2016-07-18 NOTE — Telephone Encounter (Signed)
Patient left VM today stating she was supposed to come in for a nurse visit BP check in 2 weeks. She went to [facility] and had her BP checked, it was 124/69 (76). Pt questions if she still need to come in for a nurse visit.   Attempted to contact Pt to see how she was feeling overall, left VM to return clinic call.

## 2016-07-22 ENCOUNTER — Other Ambulatory Visit: Payer: Self-pay

## 2016-07-22 DIAGNOSIS — F411 Generalized anxiety disorder: Secondary | ICD-10-CM

## 2016-07-22 DIAGNOSIS — I10 Essential (primary) hypertension: Secondary | ICD-10-CM

## 2016-07-22 MED ORDER — LISINOPRIL 20 MG PO TABS: 20.0000 mg | ORAL_TABLET | Freq: Every day | ORAL | 0 refills | Status: DC

## 2016-07-22 MED ORDER — BUSPIRONE HCL 10 MG PO TABS: 10.0000 mg | ORAL_TABLET | Freq: Three times a day (TID) | ORAL | 0 refills | Status: DC

## 2016-07-22 NOTE — Telephone Encounter (Signed)
Go ahead and make sure she has refills for 3 months. Recheck in 3 months.

## 2016-07-22 NOTE — Telephone Encounter (Signed)
Pt notified and refills sent.

## 2016-09-13 ENCOUNTER — Other Ambulatory Visit: Payer: Self-pay | Admitting: Physician Assistant

## 2016-10-10 ENCOUNTER — Other Ambulatory Visit: Payer: Self-pay | Admitting: Physician Assistant

## 2016-11-01 ENCOUNTER — Ambulatory Visit (INDEPENDENT_AMBULATORY_CARE_PROVIDER_SITE_OTHER): Payer: Medicare Other | Admitting: Family Medicine

## 2016-11-01 ENCOUNTER — Ambulatory Visit (INDEPENDENT_AMBULATORY_CARE_PROVIDER_SITE_OTHER): Payer: Medicare Other

## 2016-11-01 VITALS — BP 173/63 | HR 75 | Temp 97.5°F | Wt 176.0 lb

## 2016-11-01 DIAGNOSIS — R062 Wheezing: Secondary | ICD-10-CM

## 2016-11-01 DIAGNOSIS — J40 Bronchitis, not specified as acute or chronic: Secondary | ICD-10-CM

## 2016-11-01 DIAGNOSIS — J4520 Mild intermittent asthma, uncomplicated: Secondary | ICD-10-CM

## 2016-11-01 DIAGNOSIS — R05 Cough: Secondary | ICD-10-CM | POA: Diagnosis not present

## 2016-11-01 MED ORDER — SPACER/AERO CHAMBER MOUTHPIECE MISC: 0 refills | Status: DC

## 2016-11-01 MED ORDER — IPRATROPIUM-ALBUTEROL 0.5-2.5 (3) MG/3ML IN SOLN: 3.0000 mL | Freq: Four times a day (QID) | RESPIRATORY_TRACT | Status: DC

## 2016-11-01 MED ORDER — IPRATROPIUM-ALBUTEROL 0.5-2.5 (3) MG/3ML IN SOLN
3.0000 mL | Freq: Once | RESPIRATORY_TRACT | Status: AC
  Administered 2016-11-01: 3 mL via RESPIRATORY_TRACT

## 2016-11-01 MED ORDER — AZITHROMYCIN 250 MG PO TABS: 250.0000 mg | ORAL_TABLET | Freq: Every day | ORAL | 0 refills | Status: DC

## 2016-11-01 MED ORDER — ALBUTEROL SULFATE HFA 108 (90 BASE) MCG/ACT IN AERS: 2.0000 | INHALATION_SPRAY | Freq: Four times a day (QID) | RESPIRATORY_TRACT | 2 refills | Status: DC | PRN

## 2016-11-01 MED ORDER — ALBUTEROL SULFATE 108 (90 BASE) MCG/ACT IN AEPB: 1.0000 | INHALATION_SPRAY | RESPIRATORY_TRACT | 1 refills | Status: DC | PRN

## 2016-11-01 MED ORDER — PREDNISONE 10 MG PO TABS: 30.0000 mg | ORAL_TABLET | Freq: Every day | ORAL | 0 refills | Status: DC

## 2016-11-01 NOTE — Patient Instructions (Signed)
Thank you for coming in today. Use albuterol either with the puffer with a spacer or the respiclick device up to every 4 hours for wheezing as needed.  Take prednisone daily for 5 days.  Take azithromycin antibiotic for 5 days.   Return as needed  Call or go to the emergency room if you get worse, have trouble breathing, have chest pains, or palpitations.    Acute Bronchitis, Adult Acute bronchitis is when air tubes (bronchi) in the lungs suddenly get swollen. The condition can make it hard to breathe. It can also cause these symptoms:  A cough.  Coughing up clear, yellow, or green mucus.  Wheezing.  Chest congestion.  Shortness of breath.  A fever.  Body aches.  Chills.  A sore throat. Follow these instructions at home: Medicines   Take over-the-counter and prescription medicines only as told by your doctor.  If you were prescribed an antibiotic medicine, take it as told by your doctor. Do not stop taking the antibiotic even if you start to feel better. General instructions   Rest.  Drink enough fluids to keep your pee (urine) clear or pale yellow.  Avoid smoking and secondhand smoke. If you smoke and you need help quitting, ask your doctor. Quitting will help your lungs heal faster.  Use an inhaler, cool mist vaporizer, or humidifier as told by your doctor.  Keep all follow-up visits as told by your doctor. This is important. How is this prevented? To lower your risk of getting this condition again:  Wash your hands often with soap and water. If you cannot use soap and water, use hand sanitizer.  Avoid contact with people who have cold symptoms.  Try not to touch your hands to your mouth, nose, or eyes.  Make sure to get the flu shot every year. Contact a doctor if:  Your symptoms do not get better in 2 weeks. Get help right away if:  You cough up blood.  You have chest pain.  You have very bad shortness of breath.  You become dehydrated.  You  faint (pass out) or keep feeling like you are going to pass out.  You keep throwing up (vomiting).  You have a very bad headache.  Your fever or chills gets worse. This information is not intended to replace advice given to you by your health care provider. Make sure you discuss any questions you have with your health care provider. Document Released: 11/27/2007 Document Revised: 01/17/2016 Document Reviewed: 11/29/2015 Elsevier Interactive Patient Education  2017 [employer].

## 2016-11-01 NOTE — Progress Notes (Signed)
Angelica [name] is a 66 y.o. female who presents to [facility] today for shortness of breath wheezing and coughing. Symptoms present for about 2 weeks now. She's tried some over-the-counter medications which have not helped much. She's been using an albuterol inhaler which she notes does not help much either. She denies vomiting or diarrhea chest pain or palpitations. She has that she took some over-the-counter cold medicine this morning and suspects that's why her blood pressure is elevated.   Past Medical History:  Diagnosis Date  . Allergy   . Anxiety   . Asthma   . Breast cancer ([place]) 2009  . Depression   . Postmenopausal   . Thyroid disease    Past Surgical History:  Procedure Laterality Date  . ADENOIDECTOMY    . BREAST RECONSTRUCTION  06/2008  . MASTECTOMY  2009   [facility]  . TONSILLECTOMY     Social History  Substance Use Topics  . Smoking status: Former Smoker    Quit date: 06/25/2011  . Smokeless tobacco: Not on file  . Alcohol use No   family history includes Cancer in her mother; Hyperlipidemia in her father; Hypertension in her father; Stroke in her mother.  ROS as above:  Medications: Current Outpatient Prescriptions  Medication Sig Dispense Refill  . albuterol (PROAIR HFA) 108 (90 Base) MCG/ACT inhaler Inhale 2-4 puffs into the lungs every 6 (six) hours as needed for wheezing or shortness of breath. 18 g 2  . AMBULATORY NON FORMULARY MEDICATION daily. Alive multivitamin    . atorvastatin (LIPITOR) 40 MG tablet TAKE 1 TABLET BY MOUTH EVERY DAY 30 tablet 2  . busPIRone (BUSPAR) 10 MG tablet Take 1 tablet (10 mg total) by mouth 3 (three) times daily. 270 tablet 0  . Cholecalciferol (VITAMIN D PO) Take 2,000 Int'l Units by mouth daily.    . clonazePAM (KLONOPIN) 0.5 MG tablet Take 1 tablet (0.5 mg total) by mouth 2 (two) times daily as needed. for  anxiety 30 tablet 5  . dicyclomine (BENTYL) 10 MG capsule TAKE 1 CAPSULE BY MOUTH TWICE DAILY 60 capsule 0  . escitalopram (LEXAPRO) 20 MG tablet Take 1 tablet (20 mg total) by mouth daily. 90 tablet 1  . levothyroxine (SYNTHROID, LEVOTHROID) 112 MCG tablet Take 1 tablet (112 mcg total) by mouth daily. 90 tablet 1  . lisinopril (PRINIVIL,ZESTRIL) 20 MG tablet Take 1 tablet (20 mg total) by mouth daily. 90 tablet 0  . Multiple Vitamin (MULTIVITAMIN) tablet Take 1 tablet by mouth daily.    . Probiotic Product (PROBIOTIC DAILY PO) Take by mouth.    . sodium chloride (OCEAN) 0.65 % SOLN nasal spray Place 1 spray into both nostrils as needed. 1 Bottle 0  . temazepam (RESTORIL) 30 MG capsule Take 1 capsule (30 mg total) by mouth at bedtime as needed for sleep. 90 capsule 1  . traMADol (ULTRAM) 50 MG tablet Take 1 tablet (50 mg total) by mouth every 8 (eight) hours as needed. 30 tablet 0  . Albuterol Sulfate (PROAIR RESPICLICK) 016 (90 Base) MCG/ACT AEPB Inhale 1-2 puffs into the lungs every 4 (four) hours as needed. 1 each 1  . aspirin 81 MG tablet Take 81 mg by mouth daily. Reported on 11/14/2015    . azithromycin (ZITHROMAX) 250 MG tablet Take 1 tablet (250 mg total) by mouth daily. Take first 2 tablets together, then 1 every day until finished. 6 tablet 0  . predniSONE (  DELTASONE) 10 MG tablet Take 3 tablets (30 mg total) by mouth daily with breakfast. 15 tablet 0  . Spacer/Aero Chamber Mouthpiece MISC Spacer to use with inhailler 1 each 0   Current Facility-Administered Medications  Medication Dose Route Frequency Provider Last Rate Last Dose  . ipratropium-albuterol (DUONEB) 0.5-2.5 (3) MG/3ML nebulizer solution 3 mL  3 mL Nebulization Q6H Gregor Hams, MD       Allergies  Allergen Reactions  . Elavil [Amitriptyline]     Crazy dreams   . Macrodantin Nausea And Vomiting  . Red Dye Hives  . Sulfa Antibiotics   . Penicillins Rash    Health Maintenance Health Maintenance  Topic Date Due    . Hepatitis C Screening  [birth year]  . PNA vac Low Risk Adult (1 of 2 - PCV13) 09/26/2015  . COLONOSCOPY  04/15/2016  . INFLUENZA VACCINE  01/22/2017  . MAMMOGRAM  01/22/2018  . TETANUS/TDAP  01/30/2021  . DEXA SCAN  Completed     Exam:  BP (!) 173/63   Pulse 75   Temp 97.5 F (36.4 C) (Oral)   Wt 176 lb (79.8 kg)   LMP 06/24/2000   SpO2 100%   BMI 31.18 kg/m  Gen: Well NAD Nontoxic appearing HEENT: EOMI,  MMM clear nasal discharge normal posterior pharynx Lungs: Normal work of breathing. Wheezing present bilaterally Heart: RRR no MRG Abd: NABS, Soft. Nondistended, Nontender Exts: Brisk capillary refill, warm and well perfused.   Patient was observed using her albuterol inhaler. She presses the puffer at the end of her inhalation.  Patient was given a 2.5/0.5 mg DuoNeb nebulizer treatment and felt a little bit better.   No results found for this or any previous visit (from the past 72 hour(s)). Dg Chest 2 View  Result Date: 11/01/2016 CLINICAL DATA:  Cough, wheezing EXAM: CHEST  2 VIEW COMPARISON:  04/19/2013 FINDINGS: Heart and mediastinal contours are within normal limits. No focal opacities or effusions. No acute bony abnormality. IMPRESSION: No active cardiopulmonary disease. Electronically Signed   By: Rolm Baptise M.D.   On: 11/01/2016 10:36      Assessment and Plan: 66 y.o. female with  Bronchitis. Unlikely to be bacterial pneumonia based on chest x-ray results. Plan for treatment with prednisone and azithromycin for potential bacterial secondary infection. Additionally we will prescribe albuterol. He used to use an albuterol inhaler with a spacer or the prior rescue click device. Patient was taught how to use a conventional albuterol inhaler but continued to have difficulty with coordinating inhalation and compressing the device.   Orders Placed This Encounter  Procedures  . DG Chest 2 View    Order Specific Question:   Reason for exam:    Answer:   Cough,  assess intra-thoracic pathology    Order Specific Question:   Preferred imaging location?    Answer:   [facility]   Meds ordered this encounter  Medications  . ipratropium-albuterol (DUONEB) 0.5-2.5 (3) MG/3ML nebulizer solution 3 mL  . ipratropium-albuterol (DUONEB) 0.5-2.5 (3) MG/3ML nebulizer solution 3 mL  . predniSONE (DELTASONE) 10 MG tablet    Sig: Take 3 tablets (30 mg total) by mouth daily with breakfast.    Dispense:  15 tablet    Refill:  0  . azithromycin (ZITHROMAX) 250 MG tablet    Sig: Take 1 tablet (250 mg total) by mouth daily. Take first 2 tablets together, then 1 every day until finished.    Dispense:  6 tablet    Refill:  0  . albuterol (PROAIR HFA) 108 (90 Base) MCG/ACT inhaler    Sig: Inhale 2-4 puffs into the lungs every 6 (six) hours as needed for wheezing or shortness of breath.    Dispense:  18 g    Refill:  2  . Spacer/Aero Chamber Mouthpiece MISC    Sig: Spacer to use with inhailler    Dispense:  1 each    Refill:  0  . Albuterol Sulfate (PROAIR RESPICLICK) 514 (90 Base) MCG/ACT AEPB    Sig: Inhale 1-2 puffs into the lungs every 4 (four) hours as needed.    Dispense:  1 each    Refill:  1     Discussed warning signs or symptoms. Please see discharge instructions. Patient expresses understanding.  I spent 40 minutes with this patient, greater than 50% was face-to-face time counseling regarding the above diagnosis.

## 2016-11-11 ENCOUNTER — Other Ambulatory Visit: Payer: Self-pay | Admitting: Physician Assistant

## 2016-11-11 ENCOUNTER — Other Ambulatory Visit: Payer: Self-pay | Admitting: *Deleted

## 2016-11-11 DIAGNOSIS — I10 Essential (primary) hypertension: Secondary | ICD-10-CM

## 2016-11-11 MED ORDER — LISINOPRIL 20 MG PO TABS: 20.0000 mg | ORAL_TABLET | Freq: Every day | ORAL | 0 refills | Status: DC

## 2016-11-28 ENCOUNTER — Other Ambulatory Visit: Payer: Self-pay | Admitting: Physician Assistant

## 2016-11-28 DIAGNOSIS — F419 Anxiety disorder, unspecified: Principal | ICD-10-CM

## 2016-11-28 DIAGNOSIS — F329 Major depressive disorder, single episode, unspecified: Secondary | ICD-10-CM

## 2016-11-28 DIAGNOSIS — F32A Depression, unspecified: Secondary | ICD-10-CM

## 2016-11-29 ENCOUNTER — Ambulatory Visit (INDEPENDENT_AMBULATORY_CARE_PROVIDER_SITE_OTHER): Payer: Medicare Other | Admitting: Physician Assistant

## 2016-11-29 ENCOUNTER — Encounter: Payer: Self-pay | Admitting: Physician Assistant

## 2016-11-29 VITALS — BP 142/75 | HR 67 | Ht 63.0 in | Wt 176.0 lb

## 2016-11-29 DIAGNOSIS — I10 Essential (primary) hypertension: Secondary | ICD-10-CM | POA: Diagnosis not present

## 2016-11-29 DIAGNOSIS — F39 Unspecified mood [affective] disorder: Secondary | ICD-10-CM | POA: Diagnosis not present

## 2016-11-29 DIAGNOSIS — Z79899 Other long term (current) drug therapy: Secondary | ICD-10-CM

## 2016-11-29 DIAGNOSIS — F411 Generalized anxiety disorder: Secondary | ICD-10-CM

## 2016-11-29 DIAGNOSIS — E785 Hyperlipidemia, unspecified: Secondary | ICD-10-CM | POA: Diagnosis not present

## 2016-11-29 DIAGNOSIS — F329 Major depressive disorder, single episode, unspecified: Secondary | ICD-10-CM

## 2016-11-29 DIAGNOSIS — F419 Anxiety disorder, unspecified: Secondary | ICD-10-CM | POA: Diagnosis not present

## 2016-11-29 DIAGNOSIS — F32A Depression, unspecified: Secondary | ICD-10-CM

## 2016-11-29 MED ORDER — TEMAZEPAM 30 MG PO CAPS: 30.0000 mg | ORAL_CAPSULE | Freq: Every evening | ORAL | 1 refills | Status: DC | PRN

## 2016-11-29 MED ORDER — BUSPIRONE HCL 10 MG PO TABS: 10.0000 mg | ORAL_TABLET | Freq: Three times a day (TID) | ORAL | 1 refills | Status: DC

## 2016-11-29 MED ORDER — ATORVASTATIN CALCIUM 40 MG PO TABS: 40.0000 mg | ORAL_TABLET | Freq: Every day | ORAL | 3 refills | Status: DC

## 2016-11-29 MED ORDER — LISINOPRIL 20 MG PO TABS: 20.0000 mg | ORAL_TABLET | Freq: Every day | ORAL | 1 refills | Status: DC

## 2016-11-29 MED ORDER — DICYCLOMINE HCL 10 MG PO CAPS: 10.0000 mg | ORAL_CAPSULE | Freq: Two times a day (BID) | ORAL | 1 refills | Status: DC

## 2016-11-29 MED ORDER — ESCITALOPRAM OXALATE 20 MG PO TABS: ORAL_TABLET | ORAL | 1 refills | Status: DC

## 2016-11-29 MED ORDER — CLONAZEPAM 0.5 MG PO TABS: 0.5000 mg | ORAL_TABLET | Freq: Two times a day (BID) | ORAL | 5 refills | Status: DC | PRN

## 2016-11-29 NOTE — Progress Notes (Signed)
Subjective:    Patient ID: Angelica [name], female    DOB: Jul 28, 1950, 66 y.o.   MRN: [id number]  HPI Pt is a 66 yo female who presents to the clinic for medication refills.   HTN- not checking BP regularly but when she checks at the pharmacy around 130's over 80's. No CP, palpitations, vision changes, or headaches. Taking lisinopril but did not take today due to fasting.   She is under a lot of stress today. Her 60 yo daughter ran awayand left her 2 children with her ex husband and her to look after. Her daughter calls for money and says mean things to her. She admits she is taking a klonapin every night recently. She admits she cries a lot and just "doesn't understand what is going on".   Dyslipidemia-taking medications. No problems.   .. Active Ambulatory Problems    Diagnosis Date Noted  . Breast cancer ([place]) 01/31/2011  . GAD (generalized anxiety disorder) 01/31/2011  . Asthma 01/31/2011  . Seasonal allergies 01/31/2011  . Hypothyroid 01/31/2011  . Episodic mood disorder ([place]) 10/17/2012  . Anxiety and depression 08/03/2013  . Chondromalacia of right patellofemoral joint 02/10/2014  . Insomnia 03/15/2014  . IBS (irritable bowel syndrome) 08/22/2014  . OAB (overactive bladder) 03/08/2015  . Vitamin D deficiency 03/09/2015  . Dyslipidemia (high LDL; low HDL) 03/09/2015  . Elevated blood pressure 04/26/2015  . Trapezius muscle spasm 09/28/2015  . Epistaxis 11/14/2015  . Acute upper respiratory infection 12/15/2015  . Essential hypertension, benign 06/11/2016   Resolved Ambulatory Problems    Diagnosis Date Noted  . Impingement syndrome of left shoulder 08/28/2015   Past Medical History:  Diagnosis Date  . Allergy   . Anxiety   . Asthma   . Breast cancer ([place]) 2009  . Depression   . Postmenopausal   . Thyroid disease       Review of Systems  All other systems reviewed and are negative.      Objective:   Physical Exam  Constitutional: She is oriented to  person, place, and time. She appears well-developed and well-nourished.  HENT:  Head: Normocephalic and atraumatic.  Cardiovascular: Normal rate, regular rhythm and normal heart sounds.   Pulmonary/Chest: Effort normal and breath sounds normal.  Neurological: She is alert and oriented to person, place, and time.  Psychiatric: She has a normal mood and affect. Her behavior is normal.          Assessment & Plan:  Marland KitchenMarland KitchenDiagnoses and all orders for this visit:  Essential hypertension, benign -     lisinopril (PRINIVIL,ZESTRIL) 20 MG tablet; Take 1 tablet (20 mg total) by mouth daily.  GAD (generalized anxiety disorder) -     busPIRone (BUSPAR) 10 MG tablet; Take 1 tablet (10 mg total) by mouth 3 (three) times daily.  Episodic mood disorder (HCC)  Dyslipidemia (high LDL; low HDL) -     Lipid panel -     atorvastatin (LIPITOR) 40 MG tablet; Take 1 tablet (40 mg total) by mouth daily.  Medication management -     Lipid panel -     COMPLETE METABOLIC PANEL WITH GFR  Anxiety and depression -     temazepam (RESTORIL) 30 MG capsule; Take 1 capsule (30 mg total) by mouth at bedtime as needed for sleep. -     escitalopram (LEXAPRO) 20 MG tablet; TAKE 1 TABLET(20 MG) BY MOUTH DAILY -     clonazePAM (KLONOPIN) 0.5 MG tablet; Take 1 tablet (0.5 mg  total) by mouth 2 (two) times daily as needed. for anxiety  Other orders -     dicyclomine (BENTYL) 10 MG capsule; Take 1 capsule (10 mg total) by mouth 2 (two) times daily.   .. Depression screen [facility] 2/9 11/29/2016  Decreased Interest 0  Down, Depressed, Hopeless 0  PHQ - 2 Score 0  Altered sleeping 3  Tired, decreased energy 0  Change in appetite 3  Feeling bad or failure about yourself  3  Trouble concentrating 3  Moving slowly or fidgety/restless 3  Suicidal thoughts 0  PHQ-9 Score 15   .[name] GAD 7 : Generalized Anxiety Score 11/29/2016  Nervous, Anxious, on Edge 3  Control/stop worrying 3  Worry too much - different things 3  Trouble  relaxing 3  Restless 3  Easily annoyed or irritable 3  Afraid - awful might happen 2  Total GAD 7 Score 20   She is adjusting to her daughter running away and her having to co parent with ex husband. She does not desire to change her medications at this time.   Rechecking labs for other medication adjustments.   She has not taken BP medications for today.   Recheck BP in 3 months. Follow up sooner with mood. Caution advised to only use klonapin when needed. Abuse potential discussed.

## 2016-12-01 ENCOUNTER — Encounter: Payer: Self-pay | Admitting: Physician Assistant

## 2016-12-04 DIAGNOSIS — Z79899 Other long term (current) drug therapy: Secondary | ICD-10-CM | POA: Diagnosis not present

## 2016-12-04 DIAGNOSIS — E785 Hyperlipidemia, unspecified: Secondary | ICD-10-CM | POA: Diagnosis not present

## 2016-12-04 LAB — LIPID PANEL
Cholesterol: 155 mg/dL (ref <200)
HDL: 34 mg/dL — ABNORMAL LOW (ref >50)
LDL CALC: 91 mg/dL (ref <100)
Total CHOL/HDL Ratio: 4.6 Ratio (ref <5.0)
Triglycerides: 152 mg/dL — ABNORMAL HIGH (ref <150)
VLDL: 30 mg/dL (ref <30)

## 2016-12-04 LAB — COMPLETE METABOLIC PANEL WITH GFR
ALT: 17 U/L (ref 6–29)
AST: 21 U/L (ref 10–35)
Albumin: 4.2 g/dL (ref 3.6–5.1)
Alkaline Phosphatase: 120 U/L (ref 33–130)
BILIRUBIN TOTAL: 0.8 mg/dL (ref 0.2–1.2)
BUN: 11 mg/dL (ref 7–25)
CHLORIDE: 106 mmol/L (ref 98–110)
CO2: 24 mmol/L (ref 20–31)
CREATININE: 0.68 mg/dL (ref 0.50–0.99)
Calcium: 9.1 mg/dL (ref 8.6–10.4)
GFR, Est African American: >89 mL/min (ref >=60)
GFR, Est Non African American: >89 mL/min (ref >=60)
GLUCOSE: 101 mg/dL — AB (ref 65–99)
Potassium: 4 mmol/L (ref 3.5–5.3)
SODIUM: 141 mmol/L (ref 135–146)
TOTAL PROTEIN: 6.4 g/dL (ref 6.1–8.1)

## 2016-12-05 NOTE — Progress Notes (Signed)
Call pt: cholesterol looks good. LDL controlled. TG are coming down almost to goal. Work on HDL with good fats and exercise.

## 2017-01-15 ENCOUNTER — Other Ambulatory Visit: Payer: Self-pay | Admitting: Physician Assistant

## 2017-01-16 ENCOUNTER — Other Ambulatory Visit: Payer: Self-pay | Admitting: Physician Assistant

## 2017-01-17 ENCOUNTER — Other Ambulatory Visit: Payer: Self-pay

## 2017-01-17 MED ORDER — LEVOTHYROXINE SODIUM 112 MCG PO TABS: 112.0000 ug | ORAL_TABLET | Freq: Every day | ORAL | 0 refills | Status: DC

## 2017-02-09 ENCOUNTER — Other Ambulatory Visit: Payer: Self-pay | Admitting: Physician Assistant

## 2017-02-09 DIAGNOSIS — I10 Essential (primary) hypertension: Secondary | ICD-10-CM

## 2017-02-26 DIAGNOSIS — Z23 Encounter for immunization: Secondary | ICD-10-CM | POA: Diagnosis not present

## 2017-02-27 ENCOUNTER — Encounter: Payer: Self-pay | Admitting: *Deleted

## 2017-02-27 ENCOUNTER — Other Ambulatory Visit: Payer: Self-pay | Admitting: Physician Assistant

## 2017-02-27 DIAGNOSIS — F419 Anxiety disorder, unspecified: Principal | ICD-10-CM

## 2017-02-27 DIAGNOSIS — F329 Major depressive disorder, single episode, unspecified: Secondary | ICD-10-CM

## 2017-02-27 DIAGNOSIS — F32A Depression, unspecified: Secondary | ICD-10-CM

## 2017-03-31 ENCOUNTER — Other Ambulatory Visit: Payer: Self-pay | Admitting: Physician Assistant

## 2017-03-31 DIAGNOSIS — Z853 Personal history of malignant neoplasm of breast: Secondary | ICD-10-CM

## 2017-03-31 DIAGNOSIS — Z1231 Encounter for screening mammogram for malignant neoplasm of breast: Secondary | ICD-10-CM

## 2017-03-31 DIAGNOSIS — Z9011 Acquired absence of right breast and nipple: Secondary | ICD-10-CM

## 2017-04-14 ENCOUNTER — Ambulatory Visit
Admission: RE | Admit: 2017-04-14 | Discharge: 2017-04-14 | Disposition: A | Discharge status: Home / Self Care | Payer: Medicare Other | Source: Ambulatory Visit | Attending: Physician Assistant | Admitting: Physician Assistant

## 2017-04-14 ENCOUNTER — Encounter: Payer: Self-pay | Admitting: Physician Assistant

## 2017-04-14 DIAGNOSIS — Z1231 Encounter for screening mammogram for malignant neoplasm of breast: Secondary | ICD-10-CM

## 2017-04-14 DIAGNOSIS — Z9011 Acquired absence of right breast and nipple: Secondary | ICD-10-CM

## 2017-04-14 DIAGNOSIS — Z853 Personal history of malignant neoplasm of breast: Secondary | ICD-10-CM

## 2017-04-14 NOTE — Progress Notes (Signed)
Call pt: normal mammogram. Follow up in 1 year.

## 2017-05-07 ENCOUNTER — Other Ambulatory Visit: Payer: Self-pay | Admitting: Physician Assistant

## 2017-05-07 DIAGNOSIS — I10 Essential (primary) hypertension: Secondary | ICD-10-CM

## 2017-05-08 ENCOUNTER — Other Ambulatory Visit: Payer: Self-pay | Admitting: *Deleted

## 2017-06-02 ENCOUNTER — Ambulatory Visit: Payer: Medicare Other | Admitting: Physician Assistant

## 2017-06-04 ENCOUNTER — Ambulatory Visit (INDEPENDENT_AMBULATORY_CARE_PROVIDER_SITE_OTHER): Payer: 59 | Admitting: Physician Assistant

## 2017-06-04 ENCOUNTER — Encounter: Payer: Self-pay | Admitting: Physician Assistant

## 2017-06-04 VITALS — BP 150/84 | Wt 171.5 lb

## 2017-06-04 DIAGNOSIS — F411 Generalized anxiety disorder: Secondary | ICD-10-CM

## 2017-06-04 DIAGNOSIS — F419 Anxiety disorder, unspecified: Secondary | ICD-10-CM

## 2017-06-04 DIAGNOSIS — E039 Hypothyroidism, unspecified: Secondary | ICD-10-CM

## 2017-06-04 DIAGNOSIS — F32A Depression, unspecified: Secondary | ICD-10-CM

## 2017-06-04 DIAGNOSIS — L0231 Cutaneous abscess of buttock: Secondary | ICD-10-CM

## 2017-06-04 DIAGNOSIS — E785 Hyperlipidemia, unspecified: Secondary | ICD-10-CM | POA: Diagnosis not present

## 2017-06-04 DIAGNOSIS — L03317 Cellulitis of buttock: Secondary | ICD-10-CM | POA: Diagnosis not present

## 2017-06-04 DIAGNOSIS — I10 Essential (primary) hypertension: Secondary | ICD-10-CM

## 2017-06-04 DIAGNOSIS — F329 Major depressive disorder, single episode, unspecified: Secondary | ICD-10-CM

## 2017-06-04 DIAGNOSIS — K580 Irritable bowel syndrome with diarrhea: Secondary | ICD-10-CM | POA: Diagnosis not present

## 2017-06-04 MED ORDER — ATORVASTATIN CALCIUM 40 MG PO TABS: 40.0000 mg | ORAL_TABLET | Freq: Every day | ORAL | 0 refills | Status: DC

## 2017-06-04 MED ORDER — TEMAZEPAM 30 MG PO CAPS: 30.0000 mg | ORAL_CAPSULE | Freq: Every evening | ORAL | 1 refills | Status: DC | PRN

## 2017-06-04 MED ORDER — ESCITALOPRAM OXALATE 20 MG PO TABS: ORAL_TABLET | ORAL | 1 refills | Status: DC

## 2017-06-04 MED ORDER — BUSPIRONE HCL 10 MG PO TABS: 10.0000 mg | ORAL_TABLET | Freq: Three times a day (TID) | ORAL | 1 refills | Status: DC

## 2017-06-04 MED ORDER — DICYCLOMINE HCL 10 MG PO CAPS: 10.0000 mg | ORAL_CAPSULE | Freq: Two times a day (BID) | ORAL | 1 refills | Status: DC

## 2017-06-04 MED ORDER — DOXYCYCLINE HYCLATE 100 MG PO TABS: 100.0000 mg | ORAL_TABLET | Freq: Two times a day (BID) | ORAL | 0 refills | Status: DC

## 2017-06-04 MED ORDER — LEVOTHYROXINE SODIUM 112 MCG PO TABS: 112.0000 ug | ORAL_TABLET | Freq: Every day | ORAL | 1 refills | Status: DC

## 2017-06-04 MED ORDER — CLONAZEPAM 0.5 MG PO TABS: 0.5000 mg | ORAL_TABLET | Freq: Two times a day (BID) | ORAL | 1 refills | Status: DC | PRN

## 2017-06-04 MED ORDER — LISINOPRIL 20 MG PO TABS: ORAL_TABLET | ORAL | 1 refills | Status: DC

## 2017-06-04 NOTE — Progress Notes (Addendum)
Subjective:    Patient ID: Angelica [name], female    DOB: 01-Aug-1950, 66 y.o.   MRN: [id number]  HPI Patient is a 66 y/o female who presents for 6 month follow-up.  HTN - Patient is currently taking her BP medications, took it this morning. Elevated today at 150/84 mmHg. Denies chest pain, palpitations, or SOB. She has lost 14 lbs because she has stopped stress eating.  Dyslipidemia - Patient reports losing her lipitor a week after filling the prescription, she thinks that her grandchildren accidentally threw it away. She has been taking her husbands because he is also on Lipitor. She is not taking aspirin daily as prescribed.  Anxiety/Depression - Patient is doing well on Lexapro. She reports Klonopin use as needed for anxiety, however has rarely had to use this recently. Her daughter is back and seeking treatment for her anxiety and depression. Her daughter and her daughters children are currently living with her and she is dong well. Denies any suicidal or homicidal thoughts.   Patient reports a boil on her buttocks that appeared 1 week ago. She describes it as painful but is unable to see it due to the location. She has tried warm compresses and cleaning it with alcohol with little relief. She has no fever, chills, SOB, body aches.   .. Active Ambulatory Problems    Diagnosis Date Noted  . Breast cancer ([place]) 01/31/2011  . GAD (generalized anxiety disorder) 01/31/2011  . Asthma 01/31/2011  . Seasonal allergies 01/31/2011  . Hypothyroid 01/31/2011  . Episodic mood disorder ([place]) 10/17/2012  . Anxiety and depression 08/03/2013  . Chondromalacia of right patellofemoral joint 02/10/2014  . Insomnia 03/15/2014  . IBS (irritable bowel syndrome) 08/22/2014  . OAB (overactive bladder) 03/08/2015  . Vitamin D deficiency 03/09/2015  . Dyslipidemia (high LDL; low HDL) 03/09/2015  . Elevated blood pressure 04/26/2015  . Trapezius muscle spasm 09/28/2015  . Epistaxis 11/14/2015  . Acute  upper respiratory infection 12/15/2015  . Essential hypertension, benign 06/11/2016  . Cellulitis and abscess of buttock 06/04/2017   Resolved Ambulatory Problems    Diagnosis Date Noted  . Impingement syndrome of left shoulder 08/28/2015   Past Medical History:  Diagnosis Date  . Allergy   . Anxiety   . Asthma   . Breast cancer ([place]) 2009  . Depression   . Postmenopausal   . Thyroid disease      Review of Systems See HPI, all other systems reviewed are negative.    Objective:   Physical Exam  Constitutional: She is oriented to person, place, and time. She appears well-developed and well-nourished.  HENT:  Head: Normocephalic and atraumatic.  Neck: Neck supple. No thyromegaly present.  Cardiovascular: Normal rate, regular rhythm and normal heart sounds.  Pulmonary/Chest: Effort normal and breath sounds normal.  Neurological: She is alert and oriented to person, place, and time.  Skin: Skin is warm and dry.     1 cm flat, ulcerated lesion with indurated and erythematous base, warm to touch, located in the right crease of the buttocks.  Psychiatric: She has a normal mood and affect. Her behavior is normal. Thought content normal.  Vitals reviewed.     Assessment & Plan:  .Marland KitchenMarland KitchenDiagnoses and all orders for this visit:  Essential hypertension, benign -     lisinopril (PRINIVIL,ZESTRIL) 20 MG tablet; TAKE 1 TABLET(20 MG) BY MOUTH DAILY  Dyslipidemia (high LDL; low HDL) -     atorvastatin (LIPITOR) 40 MG tablet; Take 1 tablet (40 mg  total) by mouth daily.  Anxiety and depression -     clonazePAM (KLONOPIN) 0.5 MG tablet; Take 1 tablet (0.5 mg total) by mouth 2 (two) times daily as needed. for anxiety -     escitalopram (LEXAPRO) 20 MG tablet; TAKE 1 TABLET(20 MG) BY MOUTH DAILY -     temazepam (RESTORIL) 30 MG capsule; Take 1 capsule (30 mg total) by mouth at bedtime as needed for sleep.  GAD (generalized anxiety disorder) -     busPIRone (BUSPAR) 10 MG tablet; Take 1  tablet (10 mg total) by mouth 3 (three) times daily.  Cellulitis and abscess of buttock -     doxycycline (VIBRA-TABS) 100 MG tablet; Take 1 tablet (100 mg total) by mouth 2 (two) times daily.  Acquired hypothyroidism -     levothyroxine (SYNTHROID, LEVOTHROID) 112 MCG tablet; Take 1 tablet (112 mcg total) by mouth daily before breakfast.  Irritable bowel syndrome with diarrhea -     dicyclomine (BENTYL) 10 MG capsule; Take 1 capsule (10 mg total) by mouth 2 (two) times daily.    HTN - Continue lisinopril as prescribed. Her BP was elevated at 150/84 mmHg today. She has lost 14lb and is motivated to continue to lose weight. -she reports BP under 130's over 80's at home.   Dyslipidemia  - Continue Lipitor as prescribed. Refill sent to the pharmacy since she lost her last prescription. Advised patient that aspirin can help reduce cardiovascular risk and thin blood and to start taking 81 mg aspirin daily.  Hypothyroidism -sent over refill. Will hold on recheck TSH to get all bloodwork on same schedule -follow up in 6 months.   Anxiety & depression - Patient is doing well on Lexapro and rarely needs to use the Klonapin. Continue Lexapro as prescribed. She is also doing well on the Temazepam for sleep. -she rarely uses klonapin. She does need refill because refills will run out.  -decrease to only 1 refill for PRN usage.   Cellulitis and abscess of buttock - Patient has a 1 cm flat, ulcerated lesion with indurated and erythematous base located in the right crease of the buttocks. The area was slightly warm to the touch but no area of fluctuance was noted. Provided patient with doxycyline for acute infection and advised patient to apply warm compresses to the affected area 3 times a day. Also discussed Sitz baths and keeping the area clean, if she has loose stools she should immediately shower or bathe to prevent spread of bacteria and worsening infection. Advised patient that if it does not  get better by the beginning of next week, or she develops worsening symptoms such as increased pain, warmth, redness, or swelling of the area, as well as fever, chills, or nausea, that she should return for further evaluation.

## 2017-06-04 NOTE — Progress Notes (Deleted)
   Subjective:    Patient ID: Angelica [name], female    DOB: 12-Dec-1950, 66 y.o.   MRN: [id number]  HPI    Review of Systems     Objective:   Physical Exam        Assessment & Plan:

## 2017-06-04 NOTE — Patient Instructions (Signed)
Warm compresses three times a day.  Sitz baths at least twice a day in epson salt.  Use topical antibacterial cream Keep area dry.  Start abx.  If not improving in 7 days let me know.

## 2017-06-06 ENCOUNTER — Ambulatory Visit: Payer: Medicare Other | Admitting: Physician Assistant

## 2017-07-03 ENCOUNTER — Other Ambulatory Visit: Payer: Self-pay | Admitting: Physician Assistant

## 2017-07-31 ENCOUNTER — Other Ambulatory Visit: Payer: Self-pay | Admitting: Physician Assistant

## 2017-07-31 DIAGNOSIS — I10 Essential (primary) hypertension: Secondary | ICD-10-CM

## 2017-10-13 ENCOUNTER — Encounter (HOSPITAL_BASED_OUTPATIENT_CLINIC_OR_DEPARTMENT_OTHER): Payer: Self-pay | Admitting: *Deleted

## 2017-10-13 ENCOUNTER — Emergency Department (HOSPITAL_BASED_OUTPATIENT_CLINIC_OR_DEPARTMENT_OTHER)
Admission: EM | Admit: 2017-10-13 | Discharge: 2017-10-13 | Disposition: A | Discharge status: Home / Self Care | Payer: 59 | Attending: Emergency Medicine | Admitting: Emergency Medicine

## 2017-10-13 ENCOUNTER — Other Ambulatory Visit: Payer: Self-pay

## 2017-10-13 ENCOUNTER — Emergency Department (HOSPITAL_BASED_OUTPATIENT_CLINIC_OR_DEPARTMENT_OTHER): Payer: 59

## 2017-10-13 DIAGNOSIS — Z853 Personal history of malignant neoplasm of breast: Secondary | ICD-10-CM | POA: Diagnosis not present

## 2017-10-13 DIAGNOSIS — J45909 Unspecified asthma, uncomplicated: Secondary | ICD-10-CM | POA: Diagnosis not present

## 2017-10-13 DIAGNOSIS — K8020 Calculus of gallbladder without cholecystitis without obstruction: Secondary | ICD-10-CM | POA: Diagnosis not present

## 2017-10-13 DIAGNOSIS — R197 Diarrhea, unspecified: Secondary | ICD-10-CM | POA: Diagnosis not present

## 2017-10-13 DIAGNOSIS — Z79899 Other long term (current) drug therapy: Secondary | ICD-10-CM | POA: Diagnosis not present

## 2017-10-13 DIAGNOSIS — Z7982 Long term (current) use of aspirin: Secondary | ICD-10-CM | POA: Insufficient documentation

## 2017-10-13 DIAGNOSIS — R079 Chest pain, unspecified: Secondary | ICD-10-CM | POA: Diagnosis present

## 2017-10-13 DIAGNOSIS — E039 Hypothyroidism, unspecified: Secondary | ICD-10-CM | POA: Diagnosis not present

## 2017-10-13 DIAGNOSIS — R1084 Generalized abdominal pain: Secondary | ICD-10-CM

## 2017-10-13 DIAGNOSIS — R0789 Other chest pain: Secondary | ICD-10-CM | POA: Diagnosis not present

## 2017-10-13 LAB — CBC WITH DIFFERENTIAL/PLATELET
BASOS PCT: 1 %
Basophils Absolute: 0 10*3/uL (ref 0.0–0.1)
EOS ABS: 0.2 10*3/uL (ref 0.0–0.7)
Eosinophils Relative: 3 %
HCT: 41.1 % (ref 36.0–46.0)
Hemoglobin: 14.3 g/dL (ref 12.0–15.0)
Lymphocytes Relative: 41 %
Lymphs Abs: 3.5 10*3/uL (ref 0.7–4.0)
MCH: 29.1 pg (ref 26.0–34.0)
MCHC: 34.8 g/dL (ref 30.0–36.0)
MCV: 83.7 fL (ref 78.0–100.0)
MONO ABS: 0.6 10*3/uL (ref 0.1–1.0)
MONOS PCT: 7 %
NEUTROS PCT: 48 %
Neutro Abs: 4.3 10*3/uL (ref 1.7–7.7)
Platelets: 213 10*3/uL (ref 150–400)
RBC: 4.91 MIL/uL (ref 3.87–5.11)
RDW: 13.8 % (ref 11.5–15.5)
WBC: 8.6 10*3/uL (ref 4.0–10.5)

## 2017-10-13 LAB — COMPREHENSIVE METABOLIC PANEL
ALBUMIN: 4 g/dL (ref 3.5–5.0)
ALK PHOS: 111 U/L (ref 38–126)
ALT: 17 U/L (ref 14–54)
ANION GAP: 6 (ref 5–15)
AST: 22 U/L (ref 15–41)
BILIRUBIN TOTAL: 0.5 mg/dL (ref 0.3–1.2)
BUN: 9 mg/dL (ref 6–20)
CALCIUM: 8.9 mg/dL (ref 8.9–10.3)
CO2: 24 mmol/L (ref 22–32)
Chloride: 108 mmol/L (ref 101–111)
Creatinine, Ser: 0.65 mg/dL (ref 0.44–1.00)
GFR calc Af Amer: >60 mL/min (ref >60)
GLUCOSE: 128 mg/dL — AB (ref 65–99)
Potassium: 3.6 mmol/L (ref 3.5–5.1)
Sodium: 138 mmol/L (ref 135–145)
TOTAL PROTEIN: 6.8 g/dL (ref 6.5–8.1)

## 2017-10-13 LAB — LIPASE, BLOOD: Lipase: 104 U/L — ABNORMAL HIGH (ref 11–51)

## 2017-10-13 LAB — TROPONIN I: Troponin I: <0.03 ng/mL (ref <0.03)

## 2017-10-13 MED ORDER — MORPHINE SULFATE (PF) 4 MG/ML IV SOLN
4.0000 mg | Freq: Once | INTRAVENOUS | Status: AC
  Administered 2017-10-13: 4 mg via INTRAVENOUS
  Filled 2017-10-13: qty 1

## 2017-10-13 MED ORDER — ONDANSETRON HCL 4 MG/2ML IJ SOLN
4.0000 mg | Freq: Once | INTRAMUSCULAR | Status: AC
  Administered 2017-10-13: 4 mg via INTRAVENOUS
  Filled 2017-10-13: qty 2

## 2017-10-13 MED ORDER — SODIUM CHLORIDE 0.9 % IV BOLUS
1000.0000 mL | Freq: Once | INTRAVENOUS | Status: AC
  Administered 2017-10-13: 1000 mL via INTRAVENOUS

## 2017-10-13 MED ORDER — IOPAMIDOL (ISOVUE-300) INJECTION 61%
100.0000 mL | Freq: Once | INTRAVENOUS | Status: AC | PRN
  Administered 2017-10-13: 100 mL via INTRAVENOUS

## 2017-10-13 NOTE — ED Provider Notes (Signed)
[facility]ERGENC[facility] DEPARTMENT Provider Note   CSN: [id number] Arrival date & time: 10/13/17  1504     History   Chief Complaint Chief Complaint  Patient presents with  . Chest Pain    HPI Angelica [name] is a 67 y.o. female.  HPI  67 year old female with a history of hypertension, hyperlipidemia, and IBS presents with abdominal pain and chest pain.  Symptoms started yesterday.  They have been waxing and waning since.  Starts in her epigastric abdominal area and then seems to radiate into her chest and she will feel chest tightness.  Feels different than her asthma chest tightness.  She has a long-standing history of IBS but she is having pain in her abdomen is different.  She is having lower abdominal pain in addition to the upper abdominal pain.  She has been having diarrhea since yesterday that is nonbloody with no black stools.  Seems to occur every time she eats.  The abdomen hurts worse whenever she gets up and walks around but also whenever she eats or is about to have diarrhea.  There is no back pain.  No urinary symptoms.  She has had nausea but no vomiting.  There is no shortness of breath or diaphoresis.  She rates the pain is about a 7/10.  Past Medical History:  Diagnosis Date  . Allergy   . Anxiety   . Asthma   . Breast cancer ([place]) 2009  . Depression   . Postmenopausal   . Thyroid disease     Patient Active Problem List   Diagnosis Date Noted  . Cellulitis and abscess of buttock 06/04/2017  . Essential hypertension, benign 06/11/2016  . Acute upper respiratory infection 12/15/2015  . Epistaxis 11/14/2015  . Trapezius muscle spasm 09/28/2015  . Elevated blood pressure 04/26/2015  . Vitamin D deficiency 03/09/2015  . Dyslipidemia (high LDL; low HDL) 03/09/2015  . OAB (overactive bladder) 03/08/2015  . IBS (irritable bowel syndrome) 08/22/2014  . Insomnia 03/15/2014  . Chondromalacia of right patellofemoral joint 02/10/2014  . Anxiety and  depression 08/03/2013  . Episodic mood disorder ([place]) 10/17/2012  . Breast cancer ([place]) 01/31/2011  . GAD (generalized anxiety disorder) 01/31/2011  . Asthma 01/31/2011  . Seasonal allergies 01/31/2011  . Hypothyroid 01/31/2011    Past Surgical History:  Procedure Laterality Date  . ADENOIDECTOMY    . BREAST RECONSTRUCTION  06/2008  . MASTECTOMY  2009   [facility]  . TONSILLECTOMY       OB History   None      Home Medications    Prior to Admission medications   Medication Sig Start Date End Date Taking? Authorizing Provider  albuterol (PROAIR HFA) 108 (90 Base) MCG/ACT inhaler Inhale 2-4 puffs into the lungs every 6 (six) hours as needed for wheezing or shortness of breath. 11/01/16   Gregor Hams, MD  Albuterol Sulfate (PROAIR RESPICLICK) 562 (90 Base) MCG/ACT AEPB Inhale 1-2 puffs into the lungs every 4 (four) hours as needed. 11/01/16   Gregor Hams, MD  AMBULATORY NON FORMULARY MEDICATION daily. Alive multivitamin    [provider]  aspirin 81 MG tablet Take 81 mg by mouth daily. Reported on 11/14/2015    [provider]  atorvastatin (LIPITOR) 40 MG tablet Take 1 tablet (40 mg total) by mouth daily. 06/04/17   Breeback, [name] L, PA-C  busPIRone (BUSPAR) 10 MG tablet Take 1 tablet (10 mg total) by mouth 3 (three) times daily. 06/04/17   Iran Planas  L, PA-C  Cholecalciferol (VITAMIN D PO) Take 2,000 Int'l Units by mouth daily.    [provider]  clonazePAM (KLONOPIN) 0.5 MG tablet Take 1 tablet (0.5 mg total) by mouth 2 (two) times daily as needed. for anxiety 06/04/17   Iran Planas L, PA-C  dicyclomine (BENTYL) 10 MG capsule Take 1 capsule (10 mg total) by mouth 2 (two) times daily. 06/04/17   Breeback, Royetta Car, PA-C  doxycycline (VIBRA-TABS) 100 MG tablet Take 1 tablet (100 mg total) by mouth 2 (two) times daily. 06/04/17   Breeback, [name] L, PA-C  escitalopram (LEXAPRO) 20 MG tablet TAKE 1 TABLET(20 MG) BY MOUTH DAILY 06/04/17   Breeback, [name] L,  PA-C  levothyroxine (SYNTHROID, LEVOTHROID) 112 MCG tablet Take 1 tablet (112 mcg total) by mouth daily before breakfast. 06/04/17   Breeback, [name] L, PA-C  levothyroxine (SYNTHROID, LEVOTHROID) 112 MCG tablet TAKE 1 TABLET(112 MCG) BY MOUTH DAILY BEFORE BREAKFAST 07/04/17   Breeback, [name] L, PA-C  lisinopril (PRINIVIL,ZESTRIL) 20 MG tablet TAKE 1 TABLET(20 MG) BY MOUTH DAILY 06/04/17   Breeback, [name] L, PA-C  lisinopril (PRINIVIL,ZESTRIL) 20 MG tablet TAKE 1 TABLET(20 MG) BY MOUTH DAILY 08/01/17   Breeback, [name] L, PA-C  Multiple Vitamin (MULTIVITAMIN) tablet Take 1 tablet by mouth daily.    [provider]  sodium chloride (OCEAN) 0.65 % SOLN nasal spray Place 1 spray into both nostrils as needed. 10/14/15   Noe Gens, PA-C  Spacer/Aero Chamber Mouthpiece MISC Spacer to use with inhailler 11/01/16   Gregor Hams, MD  temazepam (RESTORIL) 30 MG capsule Take 1 capsule (30 mg total) by mouth at bedtime as needed for sleep. 06/04/17   Iran Planas L, PA-C  oxybutynin (DITROPAN) 5 MG tablet Take 1 tablet (5 mg total) by mouth 2 (two) times daily. 12/30/11 05/07/12  Hali Marry, MD    Family History Family History  Problem Relation Age of Onset  . Stroke Mother   . Cancer Mother        colon?  [name] Hypertension Father   . Hyperlipidemia Father     Social History Social History   Tobacco Use  . Smoking status: Former Smoker    Last attempt to quit: 06/25/2011    Years since quitting: 6.3  . Smokeless tobacco: Never Used  Substance Use Topics  . Alcohol use: No  . Drug use: No     Allergies   Elavil [amitriptyline]; Macrodantin; Red dye; Sulfa antibiotics; and Penicillins   Review of Systems Review of Systems  Constitutional: Negative for fever.  Respiratory: Positive for chest tightness. Negative for shortness of breath.   Cardiovascular: Positive for chest pain.  Gastrointestinal: Positive for abdominal pain, diarrhea and nausea. Negative for vomiting.    Genitourinary: Negative for dysuria and hematuria.  Musculoskeletal: Negative for back pain.  All other systems reviewed and are negative.    Physical Exam Updated Vital Signs BP (!) 145/58 (BP Location: Right Arm)   Pulse 68   Temp 98.7 F (37.1 C) (Oral)   Resp 17   Ht '5\' 3"'$  (1.6 m)   Wt 79.8 kg (176 lb)   LMP 06/24/2000   SpO2 97%   BMI 31.18 kg/m   Physical Exam  Constitutional: She is oriented to person, place, and time. She appears well-developed and well-nourished.  Non-toxic appearance. She does not appear ill. No distress.  HENT:  Head: Normocephalic and atraumatic.  Right Ear: External ear normal.  Left Ear: External ear normal.  Nose:  Nose normal.  Eyes: Right eye exhibits no discharge. Left eye exhibits no discharge.  Cardiovascular: Normal rate, regular rhythm and normal heart sounds.  Pulmonary/Chest: Effort normal and breath sounds normal.  Abdominal: Soft. There is generalized tenderness.  Musculoskeletal:       Right lower leg: She exhibits no edema.       Left lower leg: She exhibits no edema.  Neurological: She is alert and oriented to person, place, and time.  Skin: Skin is warm and dry.  Nursing note and vitals reviewed.    ED Treatments / Results  Labs (all labs ordered are listed, but only abnormal results are displayed) Labs Reviewed  COMPREHENSIVE METABOLIC PANEL - Abnormal; Notable for the following components:      Result Value   Glucose, Bld 128 (*)    All other components within normal limits  LIPASE, BLOOD - Abnormal; Notable for the following components:   Lipase 104 (*)    All other components within normal limits  TROPONIN I  CBC WITH DIFFERENTIAL/PLATELET    EKG EKG Interpretation  Date/Time:  Monday October 13 2017 15:13:39 EDT Ventricular Rate:  72 PR Interval:  136 QRS Duration: 78 QT Interval:  402 QTC Calculation: 440 R Axis:   11 Text Interpretation:  Normal sinus rhythm Nonspecific ST and T wave abnormality  Abnormal ECG no significant change since Feb 2014 Confirmed by Sherwood Gambler [phone]) on 10/13/2017 3:22:20 PM   Radiology Dg Chest 2 View  Result Date: 10/13/2017 CLINICAL DATA:  Chest pain EXAM: CHEST - 2 VIEW COMPARISON:  11/01/2016 FINDINGS: No acute pulmonary infiltrate or effusion. Normal cardiomediastinal silhouette. No pneumothorax. Surgical clips in the right axilla. IMPRESSION: No active cardiopulmonary disease. Electronically Signed   By: Donavan Foil M.D.   On: 10/13/2017 15:26   Ct Abdomen Pelvis W Contrast  Result Date: 10/13/2017 CLINICAL DATA:  Epigastric and periumbilical abdominal pain. History of breast cancer. EXAM: CT ABDOMEN AND PELVIS WITH CONTRAST TECHNIQUE: Multidetector CT imaging of the abdomen and pelvis was performed using the standard protocol following bolus administration of intravenous contrast. CONTRAST:  139m ISOVUE-300 IOPAMIDOL (ISOVUE-300) INJECTION 61% COMPARISON:  None. FINDINGS: Lower chest: No acute abnormality. Partially visualized right breast implant. Hepatobiliary: Subcentimeter low-density lesion in the left liver is too small to characterize. No other focal liver abnormality. Cholelithiasis. No gallbladder wall thickening or biliary dilatation. Pancreas: Unremarkable. No pancreatic ductal dilatation or surrounding inflammatory changes. Spleen: Normal in size without focal abnormality. Adrenals/Urinary Tract: Adrenal glands are unremarkable. Kidneys are normal, without renal calculi, focal lesion, or hydronephrosis. Bladder is unremarkable. Stomach/Bowel: Stomach is within normal limits. Appendix is not identified, however there are no secondary signs of inflammation in the right lower quadrant. No evidence of bowel wall thickening, distention, or inflammatory changes. Moderate left-sided colonic diverticulosis. Vascular/Lymphatic: Aortic atherosclerosis. No enlarged abdominal or pelvic lymph nodes. Reproductive: Uterus and bilateral adnexa are unremarkable.  1.9 cm benign-appearing low-density cyst in the right ovary. Other: No abdominal wall hernia or abnormality. No abdominopelvic ascites. No pneumoperitoneum. Musculoskeletal: No acute or significant osseous findings. Severe degenerative disc disease at L4-L5. IMPRESSION: 1.  No acute intra-abdominal process. 2. Cholelithiasis. 3.  Aortic atherosclerosis (ICD10-I70.0). Electronically Signed   By: WTitus DubinM.D.   On: 10/13/2017 17:21    Procedures Procedures (including critical care time)  Medications Ordered in ED Medications  sodium chloride 0.9 % bolus 1,000 mL (0 mLs Intravenous Stopped 10/13/17 1750)  ondansetron (ZOFRAN) injection 4 mg (4 mg Intravenous Given 10/13/17 1548)  morphine 4 MG/ML injection 4 mg (4 mg Intravenous Given 10/13/17 1548)  iopamidol (ISOVUE-300) 61 % injection 100 mL (100 mLs Intravenous Contrast Given 10/13/17 1652)     Initial Impression / Assessment and Plan / ED Course  I have reviewed the triage vital signs and the nursing notes.  Pertinent labs & imaging results that were available during my care of the patient were reviewed by me and considered in my medical decision making (see chart for details).     Patient has generalized abdominal pain and given she feels this is different than her typical along with her age, CT obtained.  No acute abdominal pathology noted.  She does have cholelithiasis but no obvious swelling or inflammatory changes and given normal LFTs I think it is unlikely this is the source of her pain today.  Her chest pain is atypical and I think radiating from her abdomen.  However given age ECG and troponin obtained given the pain started yesterday.  Likely she just has a viral GI illness.  She feels better and appears stable for discharge home.  Return precautions.  Final Clinical Impressions(s) / ED Diagnoses   Final diagnoses:  Generalized abdominal pain  Diarrhea, unspecified type  Calculus of gallbladder without cholecystitis  without obstruction  Chest tightness    ED Discharge Orders    None       Sherwood Gambler, MD 10/14/17 Benancio Deeds

## 2017-10-13 NOTE — ED Triage Notes (Signed)
Epigastric pain since last night. She is pressing hard again her epigastric area and pushing with the heel of her hand. Diarrhea.

## 2017-10-20 ENCOUNTER — Encounter: Payer: Self-pay | Admitting: Physician Assistant

## 2017-10-20 ENCOUNTER — Ambulatory Visit (INDEPENDENT_AMBULATORY_CARE_PROVIDER_SITE_OTHER): Payer: 59 | Admitting: Physician Assistant

## 2017-10-20 VITALS — BP 167/53 | HR 62 | Temp 98.4°F | Wt 173.3 lb

## 2017-10-20 DIAGNOSIS — E785 Hyperlipidemia, unspecified: Secondary | ICD-10-CM | POA: Diagnosis not present

## 2017-10-20 DIAGNOSIS — R197 Diarrhea, unspecified: Secondary | ICD-10-CM

## 2017-10-20 DIAGNOSIS — R1010 Upper abdominal pain, unspecified: Secondary | ICD-10-CM

## 2017-10-20 DIAGNOSIS — F419 Anxiety disorder, unspecified: Secondary | ICD-10-CM | POA: Diagnosis not present

## 2017-10-20 DIAGNOSIS — R748 Abnormal levels of other serum enzymes: Secondary | ICD-10-CM | POA: Diagnosis not present

## 2017-10-20 DIAGNOSIS — E039 Hypothyroidism, unspecified: Secondary | ICD-10-CM | POA: Diagnosis not present

## 2017-10-20 DIAGNOSIS — Z13220 Encounter for screening for lipoid disorders: Secondary | ICD-10-CM | POA: Diagnosis not present

## 2017-10-20 DIAGNOSIS — F329 Major depressive disorder, single episode, unspecified: Secondary | ICD-10-CM | POA: Diagnosis not present

## 2017-10-20 DIAGNOSIS — Z131 Encounter for screening for diabetes mellitus: Secondary | ICD-10-CM

## 2017-10-20 DIAGNOSIS — F32A Depression, unspecified: Secondary | ICD-10-CM

## 2017-10-20 DIAGNOSIS — K8020 Calculus of gallbladder without cholecystitis without obstruction: Secondary | ICD-10-CM | POA: Diagnosis not present

## 2017-10-20 MED ORDER — CLONAZEPAM 0.5 MG PO TABS: 0.5000 mg | ORAL_TABLET | Freq: Two times a day (BID) | ORAL | 5 refills | Status: DC | PRN

## 2017-10-20 NOTE — Progress Notes (Signed)
Subjective:    Patient ID: Angelica [name], female    DOB: 04-13-1951, 67 y.o.   MRN: [id number]  HPI  Pt is a 67 yo female with HTN, hyperlipidemia, IBS, and hypothyroidism who presents to the clinic to follow up after ED visit on 4/22 for Generalized abdominal pain. For the past few months she has been having more upper abdominal pain, gas, diarrhea that is worsening.  Finally she went to ED for work up. CT scan was done and saw gallstones but no evidence of acute inflammation. They suspected viral gastroenteritis. She feels like it has been ongoing for too long to be this. She has almost had to stop eating everything. Symptoms are made worse by food and especially fried foods.  Her lipase was a little elevated on labs. She denies any alcohol use.  Last night she had a hot dog and ended up having some fecal incontience. No fever, chills, body aches. No blood in stool. She c/o of right upper quadrant feeling like a 'gnawing sensation". No vomiting. She is having a lot of flatulence.   Hypothyroidism- doing well. Needs refills.   Anxiety and depression- doing ok on lexapro, buspar, klonapin. She is still having some problems worrying. No SI/HC.     [name]. Active Ambulatory Problems    Diagnosis Date Noted  . Breast cancer ([place]) 01/31/2011  . GAD (generalized anxiety disorder) 01/31/2011  . Asthma 01/31/2011  . Seasonal allergies 01/31/2011  . Hypothyroid 01/31/2011  . Episodic mood disorder ([place]) 10/17/2012  . Anxiety and depression 08/03/2013  . Chondromalacia of right patellofemoral joint 02/10/2014  . Insomnia 03/15/2014  . IBS (irritable bowel syndrome) 08/22/2014  . OAB (overactive bladder) 03/08/2015  . Vitamin D deficiency 03/09/2015  . Dyslipidemia (high LDL; low HDL) 03/09/2015  . Elevated blood pressure 04/26/2015  . Trapezius muscle spasm 09/28/2015  . Epistaxis 11/14/2015  . Acute upper respiratory infection 12/15/2015  . Essential hypertension, benign 06/11/2016  .  Cellulitis and abscess of buttock 06/04/2017  . Calculus of gallbladder 10/21/2017  . Elevated lipase 10/21/2017  . Diarrhea 10/21/2017   Resolved Ambulatory Problems    Diagnosis Date Noted  . Impingement syndrome of left shoulder 08/28/2015   Past Medical History:  Diagnosis Date  . Allergy   . Anxiety   . Asthma   . Breast cancer ([place]) 2009  . Depression   . Postmenopausal   . Thyroid disease       Review of Systems    see HPI.  Objective:   Physical Exam  Constitutional: She appears well-developed and well-nourished.  HENT:  Head: Normocephalic and atraumatic.  Neck: Normal range of motion. Neck supple.  Cardiovascular: Normal rate and regular rhythm.  Pulmonary/Chest: Effort normal and breath sounds normal.  Abdominal: Bowel sounds are normal. She exhibits distension. She exhibits no mass. There is tenderness. There is guarding. There is no rebound. No hernia.  Mild tenderness over right and left upper quadrants. No rebound. Some mild guarding in right upper quadrant but negative murphy sign.   Skin: Skin is dry.  Psychiatric: She has a normal mood and affect. Her behavior is normal.          Assessment & Plan:  Marland KitchenMarland KitchenShian was seen today for follow-up.  Diagnoses and all orders for this visit:  Calculus of gallbladder without cholecystitis without obstruction -     Ambulatory referral to General Surgery  Anxiety and depression -     clonazePAM (KLONOPIN) 0.5 MG tablet; Take 1  tablet (0.5 mg total) by mouth 2 (two) times daily as needed. for anxiety  Screening for lipid disorders -     Lipid Panel w/reflex Direct LDL  Acquired hypothyroidism -     TSH  Diarrhea, unspecified type -     CBC -     Ambulatory referral to General Surgery  Elevated lipase -     Lipase -     Ambulatory referral to General Surgery  Screening for diabetes mellitus -     COMPLETE METABOLIC PANEL WITH GFR  Upper abdominal pain -     Ambulatory referral to General  Surgery   .[name] Depression screen [facility] 2/9 10/20/2017 11/29/2016  Decreased Interest 1 0  Down, Depressed, Hopeless 1 0  PHQ - 2 Score 2 0  Altered sleeping 2 3  Tired, decreased energy 2 0  Change in appetite 2 3  Feeling bad or failure about yourself  1 3  Trouble concentrating 1 3  Moving slowly or fidgety/restless 1 3  Suicidal thoughts 0 0  PHQ-9 Score 11 15  Difficult doing work/chores Somewhat difficult -   .. GAD 7 : Generalized Anxiety Score 10/20/2017 11/29/2016  Nervous, Anxious, on Edge 2 3  Control/stop worrying 1 3  Worry too much - different things 1 3  Trouble relaxing 2 3  Restless 2 3  Easily annoyed or irritable 2 3  Afraid - awful might happen 2 2  Total GAD 7 Score 12 20  Anxiety Difficulty Somewhat difficult -    I feel like her GI symptoms could represent some chronic gallbladder issues. Will make referral to general surgery to get removed. Will recheck labs today including lipase.   TSH and other fasting labs ordered today. Will refill medication appriopratiely.   Anxiety and depression worsening. Discussed exercise, medication, coping techniques. I do not want to increase klonapin due to her age and being on restoril at bedtime as well. Pt aware of abuse potential and making anxiety worse. Follow up as needed.   [name].Spent 30 minutes with patient and greater than 50 percent of visit spent counseling patient regarding treatment plan.

## 2017-10-21 DIAGNOSIS — R1010 Upper abdominal pain, unspecified: Secondary | ICD-10-CM | POA: Insufficient documentation

## 2017-10-21 DIAGNOSIS — K8020 Calculus of gallbladder without cholecystitis without obstruction: Secondary | ICD-10-CM | POA: Insufficient documentation

## 2017-10-21 DIAGNOSIS — R748 Abnormal levels of other serum enzymes: Secondary | ICD-10-CM | POA: Insufficient documentation

## 2017-10-21 DIAGNOSIS — R197 Diarrhea, unspecified: Secondary | ICD-10-CM | POA: Insufficient documentation

## 2017-10-21 LAB — CBC
HCT: 41.6 % (ref 35.0–45.0)
HEMOGLOBIN: 14.2 g/dL (ref 11.7–15.5)
MCH: 28.5 pg (ref 27.0–33.0)
MCHC: 34.1 g/dL (ref 32.0–36.0)
MCV: 83.5 fL (ref 80.0–100.0)
MPV: 12.4 fL (ref 7.5–12.5)
PLATELETS: 210 10*3/uL (ref 140–400)
RBC: 4.98 10*6/uL (ref 3.80–5.10)
RDW: 13.6 % (ref 11.0–15.0)
WBC: 8 10*3/uL (ref 3.8–10.8)

## 2017-10-21 LAB — TSH: TSH: 0.14 m[IU]/L — AB (ref 0.40–4.50)

## 2017-10-22 LAB — COMPLETE METABOLIC PANEL WITH GFR
AG Ratio: 1.8 (calc) (ref 1.0–2.5)
ALBUMIN MSPROF: 4.3 g/dL (ref 3.6–5.1)
ALT: 12 U/L (ref 6–29)
AST: 17 U/L (ref 10–35)
Alkaline phosphatase (APISO): 115 U/L (ref 33–130)
BUN: 9 mg/dL (ref 7–25)
CO2: 27 mmol/L (ref 20–32)
CREATININE: 0.69 mg/dL (ref 0.50–0.99)
Calcium: 9.4 mg/dL (ref 8.6–10.4)
Chloride: 107 mmol/L (ref 98–110)
GFR, EST AFRICAN AMERICAN: 104 mL/min/{1.73_m2} (ref >=60)
GFR, EST NON AFRICAN AMERICAN: 90 mL/min/{1.73_m2} (ref >=60)
GLUCOSE: 102 mg/dL — AB (ref 65–99)
Globulin: 2.4 g/dL (calc) (ref 1.9–3.7)
Potassium: 4.1 mmol/L (ref 3.5–5.3)
SODIUM: 142 mmol/L (ref 135–146)
TOTAL PROTEIN: 6.7 g/dL (ref 6.1–8.1)
Total Bilirubin: 1.1 mg/dL (ref 0.2–1.2)

## 2017-10-22 LAB — LIPID PANEL W/REFLEX DIRECT LDL
CHOLESTEROL: 169 mg/dL (ref <200)
HDL: 36 mg/dL — ABNORMAL LOW (ref >50)
LDL CHOLESTEROL (CALC): 106 mg/dL — AB
Non-HDL Cholesterol (Calc): 133 mg/dL (calc) — ABNORMAL HIGH (ref <130)
Total CHOL/HDL Ratio: 4.7 (calc) (ref <5.0)
Triglycerides: 152 mg/dL — ABNORMAL HIGH (ref <150)

## 2017-10-22 LAB — LIPASE: LIPASE: 34 U/L (ref 7–60)

## 2017-10-22 MED ORDER — LEVOTHYROXINE SODIUM 100 MCG PO TABS: 100.0000 ug | ORAL_TABLET | Freq: Every day | ORAL | 1 refills | Status: DC

## 2017-10-22 MED ORDER — ATORVASTATIN CALCIUM 40 MG PO TABS: 40.0000 mg | ORAL_TABLET | Freq: Every day | ORAL | 4 refills | Status: DC

## 2017-10-22 NOTE — Addendum Note (Signed)
Addended by: Donella Stade on: 10/22/2017 07:44 AM   Modules accepted: Orders

## 2017-10-22 NOTE — Progress Notes (Signed)
Call pt: TSH is low meaning too much thyroid hormone circulating. Cut back on levothyroxine to 146mcg and sent to pharmacy. This number could causing some of your anxiety.   Cholesterol looks good. Refilled Lipitor for one year.

## 2017-10-29 ENCOUNTER — Ambulatory Visit: Payer: Self-pay | Admitting: Surgery

## 2017-10-29 DIAGNOSIS — K8020 Calculus of gallbladder without cholecystitis without obstruction: Secondary | ICD-10-CM | POA: Diagnosis not present

## 2017-10-29 NOTE — H&P (View-Only) (Signed)
Angelica [name] Documented: 10/29/2017 10:35 AM Location: [facility] Patient #: [id number] DOB: May 28, 1951 Married / Language: [name] / Race: White Female  History of Present Illness [name] A. Aniyia Rane MD; 10/29/2017 11:17 AM) Patient words: GB    Patient sent at the request of [name] with history of epigastric and right upper quadrant abdominal pain. She was seen last month the emergency room due to exacerbation of severe epigastric and right upper quadrant abdominal pain which radiated to her back. CT scan workup showed a large gallstone. Her symptoms are treated and she was discharged. She cannot eat fatty foods since this makes abdominal pain worse. Locations were upper quadrant with radiation to the back with episodes over the last 2 months had been increasing in length in intensity. The pain is sharp in nature without nausea or vomiting but she does have diarrhea with it.                     Epigastric and periumbilical abdominal pain. History of breast cancer.  EXAM: CT ABDOMEN AND PELVIS WITH CONTRAST  TECHNIQUE: Multidetector CT imaging of the abdomen and pelvis was performed using the standard protocol following bolus administration of intravenous contrast.  CONTRAST: 19mL ISOVUE-300 IOPAMIDOL (ISOVUE-300) INJECTION 61%  COMPARISON: None.  FINDINGS: Lower chest: No acute abnormality. Partially visualized right breast implant.  Hepatobiliary: Subcentimeter low-density lesion in the left liver is too small to characterize. No other focal liver abnormality. Cholelithiasis. No gallbladder wall thickening or biliary dilatation.  Pancreas: Unremarkable. No pancreatic ductal dilatation or surrounding inflammatory changes.  Spleen: Normal in size without focal abnormality.  Adrenals/Urinary Tract: Adrenal glands are unremarkable. Kidneys are normal, without renal calculi, focal lesion, or hydronephrosis. Bladder is  unremarkable.  Stomach/Bowel: Stomach is within normal limits. Appendix is not identified, however there are no secondary signs of inflammation in the right lower quadrant. No evidence of bowel wall thickening, distention, or inflammatory changes. Moderate left-sided colonic diverticulosis.  Vascular/Lymphatic: Aortic atherosclerosis. No enlarged abdominal or pelvic lymph nodes.  Reproductive: Uterus and bilateral adnexa are unremarkable. 1.9 cm benign-appearing low-density cyst in the right ovary.  Other: No abdominal wall hernia or abnormality. No abdominopelvic ascites. No pneumoperitoneum.  Musculoskeletal: No acute or significant osseous findings. Severe degenerative disc disease at L4-L5.  IMPRESSION: 1. No acute intra-abdominal process. 2. Cholelithiasis. 3. Aortic atherosclerosis (ICD10-I70.0).   Electronically Signed By: [name] On: 10/13/2017 17:21.  The patient is a 67 year old female.   Past Surgical History [name], CMA; 10/29/2017 10:36 AM) Breast Reconstruction Right. Mammoplasty; Reduction Left. Mastectomy Right.  Allergies [name], CMA; 10/29/2017 10:37 AM) Penicillin G Pot in Dextrose *PENICILLINS* Sulfa 10 *OPHTHALMIC AGENTS* Macrodantin *URINARY ANTI-INFECTIVES*  Medication History ([name], CMA; 10/29/2017 10:39 AM) Escitalopram Oxalate (20MG  Tablet, Oral) Active. Levothyroxine Sodium (100MCG Tablet, Oral) Active. BusPIRone HCl (10MG  Tablet, Oral) Active. Lisinopril (20MG  Tablet, Oral) Active. Atorvastatin Calcium (40MG  Tablet, Oral) Active. ClonazePAM (0.5MG  Tablet, Oral) Active. Medications Reconciled  Social History [name], CMA; 10/29/2017 10:36 AM) Caffeine use Carbonated beverages. No alcohol use No drug use Tobacco use Former smoker.  Family History [name], CMA; 10/29/2017 10:36 AM) Arthritis Father. Cerebrovascular Accident  Mother. Hypertension Father. Migraine Headache Father. Rectal Cancer Mother.     Review of Systems (Milo. Brooks CMA; 10/29/2017 10:36 AM) General Not Present- Appetite Loss, Chills, Fatigue, Fever, Night Sweats, Weight Gain and Weight Loss. Skin Not Present- Change in Wart/Mole, Dryness,  Hives, Jaundice, New Lesions, Non-Healing Wounds, Rash and Ulcer. HEENT Not Present- Earache, Hearing Loss, Hoarseness, Nose Bleed, Oral Ulcers, Ringing in the Ears, Seasonal Allergies, Sinus Pain, Sore Throat, Visual Disturbances, Wears glasses/contact lenses and Yellow Eyes. Respiratory Not Present- Bloody sputum, Chronic Cough, Difficulty Breathing, Snoring and Wheezing. Breast Not Present- Breast Mass, Breast Pain, Nipple Discharge and Skin Changes. Gastrointestinal Present- Abdominal Pain, Bloating, Excessive gas, Indigestion and Nausea. Not Present- Bloody Stool, Change in Bowel Habits, Chronic diarrhea, Constipation, Difficulty Swallowing, Gets full quickly at meals, Hemorrhoids, Rectal Pain and Vomiting. Psychiatric Present- Anxiety and Depression. Not Present- Bipolar, Change in Sleep Pattern, Fearful and Frequent crying.  Vitals [name]; 10/29/2017 10:36 AM) 10/29/2017 10:35 AM Weight: 171.25 lb Height: 63.5in Body Surface Area: 1.82 m Body Mass Index: 29.86 kg/m  BP: 140/86 (Sitting, Left Arm, Standard)      Physical Exam (Jerelyn Trimarco A. Khalel Alms MD; 10/29/2017 11:18 AM)  General Mental Status-Alert. General Appearance-Consistent with stated age. Hydration-Well hydrated. Voice-Normal.  Head and Neck Head-normocephalic, atraumatic with no lesions or palpable masses. Trachea-midline. Thyroid Gland Characteristics - normal size and consistency.  Eye Eyeball - Bilateral-Extraocular movements intact. Sclera/Conjunctiva - Bilateral-No scleral icterus.  Chest and Lung Exam Chest and lung exam reveals -quiet, even and easy respiratory effort with  no use of accessory muscles and on auscultation, normal breath sounds, no adventitious sounds and normal vocal resonance. Inspection Chest Wall - Normal. Back - normal.  Cardiovascular Cardiovascular examination reveals -normal heart sounds, regular rate and rhythm with no murmurs and normal pedal pulses bilaterally.  Abdomen Note: Mild epigastric tenderness to palpation.  Neurologic Neurologic evaluation reveals -alert and oriented x 3 with no impairment of recent or remote memory. Mental Status-Normal.  Musculoskeletal Normal Exam - Left-Upper Extremity Strength Normal and Lower Extremity Strength Normal. Normal Exam - Right-Upper Extremity Strength Normal and Lower Extremity Strength Normal.    Assessment & Plan (Krystel Fletchall A. Vaishali Baise MD; 10/29/2017 11:18 AM)  SYMPTOMATIC CHOLELITHIASIS (K80.20) Impression: Discuss treatment options. Recommend laparoscopic cholecystectomy and cholangiogram. The procedure has been discussed with the patient. Risks of laparoscopic cholecystectomy include bleeding, infection, bile duct injury, leak, death, open surgery, diarrhea, other surgery, organ injury, blood vessel injury, DVT, and additional care.  Current Plans You are being scheduled for surgery- Our schedulers will call you.  You should hear from our office's scheduling department within 5 working days about the location, date, and time of surgery. We try to make accommodations for patient's preferences in scheduling surgery, but sometimes the OR schedule or the surgeon's schedule prevents [place] from making those accommodations.  If you have not heard from our office [phone]) in 5 working days, call the office and ask for your surgeon's nurse.  If you have other questions about your diagnosis, plan, or surgery, call the office and ask for your surgeon's nurse.  The anatomy & physiology of hepatobiliary & pancreatic function was discussed. The pathophysiology of gallbladder  dysfunction was discussed. Natural history risks without surgery was discussed. I feel the risks of no intervention will lead to serious problems that outweigh the operative risks; therefore, I recommended cholecystectomy to remove the pathology. I explained laparoscopic techniques with possible need for an open approach. Probable cholangiogram to evaluate the bilary tract was explained as well.  Risks such as bleeding, infection, abscess, leak, injury to other organs, need for further treatment, heart attack, death, and other risks were discussed. I noted a good likelihood this will help address the problem. Possibility that this will  not correct all abdominal symptoms was explained. Goals of post-operative recovery were discussed as well. We will work to minimize complications. An educational handout further explaining the pathology and treatment options was given as well. Questions were answered. The patient expresses understanding & wishes to proceed with surgery.  Written instructions provided Pt Education - [facility] Good Bowel Health Pt Education - [facility] Laparosopic Post Op HCI

## 2017-10-29 NOTE — H&P (Signed)
Angelica [name] Documented: 10/29/2017 10:35 AM Location: [facility] Patient #: [id number] DOB: [birth year] Married / Language: Angelica [name] / Race: White Female  History of Present Illness Angelica Moores A. Ayen Viviano Angelica [name]; 10/29/2017 11:17 AM) Patient words: GB    Patient sent at the request of Angelica Planas Angelica [name] with history of epigastric and right upper quadrant abdominal pain. She was seen last month the emergency room due to exacerbation of severe epigastric and right upper quadrant abdominal pain which radiated to her back. CT scan workup showed a large gallstone. Her symptoms are treated and she was discharged. She cannot eat fatty foods since this makes abdominal pain worse. Locations were upper quadrant with radiation to the back with episodes over the last 2 months had been increasing in length in intensity. The pain is sharp in nature without nausea or vomiting but she does have diarrhea with it.                     Epigastric and periumbilical abdominal pain. History of breast cancer.  EXAM: CT ABDOMEN AND PELVIS WITH CONTRAST  TECHNIQUE: Multidetector CT imaging of the abdomen and pelvis was performed using the standard protocol following bolus administration of intravenous contrast.  CONTRAST: 114mL ISOVUE-300 IOPAMIDOL (ISOVUE-300) INJECTION 61%  COMPARISON: None.  FINDINGS: Lower chest: No acute abnormality. Partially visualized right breast implant.  Hepatobiliary: Subcentimeter low-density lesion in the left liver is too small to characterize. No other focal liver abnormality. Cholelithiasis. No gallbladder wall thickening or biliary dilatation.  Pancreas: Unremarkable. No pancreatic ductal dilatation or surrounding inflammatory changes.  Spleen: Normal in size without focal abnormality.  Adrenals/Urinary Tract: Adrenal glands are unremarkable. Kidneys are normal, without renal calculi, focal lesion, or hydronephrosis. Bladder is  unremarkable.  Stomach/Bowel: Stomach is within normal limits. Appendix is not identified, however there are no secondary signs of inflammation in the right lower quadrant. No evidence of bowel wall thickening, distention, or inflammatory changes. Moderate left-sided colonic diverticulosis.  Vascular/Lymphatic: Aortic atherosclerosis. No enlarged abdominal or pelvic lymph nodes.  Reproductive: Uterus and bilateral adnexa are unremarkable. 1.9 cm benign-appearing low-density cyst in the right ovary.  Other: No abdominal wall hernia or abnormality. No abdominopelvic ascites. No pneumoperitoneum.  Musculoskeletal: No acute or significant osseous findings. Severe degenerative disc disease at L4-L5.  IMPRESSION: 1. No acute intra-abdominal process. 2. Cholelithiasis. 3. Aortic atherosclerosis (ICD10-I70.0).   Angelica [name]: Angelica [name] M.D. On: 10/13/2017 17:21.  The patient is a 67 year old female.   Past Surgical History Angelica [name], Angelica [name]; 10/29/2017 10:36 AM) Breast Reconstruction Right. Mammoplasty; Reduction Left. Mastectomy Right.  Allergies Angelica [name], Angelica [name]; 10/29/2017 10:37 AM) Penicillin G Pot in Dextrose *PENICILLINS* Sulfa 10 *OPHTHALMIC AGENTS* Macrodantin *URINARY ANTI-INFECTIVES*  Medication History (Angelica [name], Angelica [name]; 10/29/2017 10:39 AM) Escitalopram Oxalate (20MG  Tablet, Oral) Active. Levothyroxine Sodium (100MCG Tablet, Oral) Active. BusPIRone HCl (10MG  Tablet, Oral) Active. Lisinopril (20MG  Tablet, Oral) Active. Atorvastatin Calcium (40MG  Tablet, Oral) Active. ClonazePAM (0.5MG  Tablet, Oral) Active. Medications Reconciled  Social History Angelica [name], Angelica [name]; 10/29/2017 10:36 AM) Caffeine use Carbonated beverages. No alcohol use No drug use Tobacco use Former smoker.  Family History Angelica [name], Angelica [name]; 10/29/2017 10:36 AM) Arthritis Father. Cerebrovascular Accident  Mother. Hypertension Father. Migraine Headache Father. Rectal Cancer Mother.     Review of Systems (Angelica [name] Angelica [name]; 10/29/2017 10:36 AM) General Not Present- Appetite Loss, Chills, Fatigue, Fever, Night Sweats, Weight Gain and Weight Loss. Skin Not Present- Change in Wart/Mole, Dryness,  Hives, Jaundice, New Lesions, Non-Healing Wounds, Rash and Ulcer. HEENT Not Present- Earache, Hearing Loss, Hoarseness, Nose Bleed, Oral Ulcers, Ringing in the Ears, Seasonal Allergies, Sinus Pain, Sore Throat, Visual Disturbances, Wears glasses/contact lenses and Yellow Eyes. Respiratory Not Present- Bloody sputum, Chronic Cough, Difficulty Breathing, Snoring and Wheezing. Breast Not Present- Breast Mass, Breast Pain, Nipple Discharge and Skin Changes. Gastrointestinal Present- Abdominal Pain, Bloating, Excessive gas, Indigestion and Nausea. Not Present- Bloody Stool, Change in Bowel Habits, Chronic diarrhea, Constipation, Difficulty Swallowing, Gets full quickly at meals, Hemorrhoids, Rectal Pain and Vomiting. Psychiatric Present- Anxiety and Depression. Not Present- Bipolar, Change in Sleep Pattern, Fearful and Frequent crying.  Vitals Angelica [name] Angelica [name]; 10/29/2017 10:36 AM) 10/29/2017 10:35 AM Weight: 171.25 lb Height: 63.5in Body Surface Area: 1.82 m Body Mass Index: 29.86 kg/m  BP: 140/86 (Sitting, Left Arm, Standard)      Physical Exam (Angelica Degracia A. Blandina Renaldo Angelica [name]; 10/29/2017 11:18 AM)  General Mental Status-Alert. General Appearance-Consistent with stated age. Hydration-Well hydrated. Voice-Normal.  Head and Neck Head-normocephalic, atraumatic with no lesions or palpable masses. Trachea-midline. Thyroid Gland Characteristics - normal size and consistency.  Eye Eyeball - Bilateral-Extraocular movements intact. Sclera/Conjunctiva - Bilateral-No scleral icterus.  Chest and Lung Exam Chest and lung exam reveals -quiet, even and easy respiratory effort with  no use of accessory muscles and on auscultation, normal breath sounds, no adventitious sounds and normal vocal resonance. Inspection Chest Wall - Normal. Back - normal.  Cardiovascular Cardiovascular examination reveals -normal heart sounds, regular rate and rhythm with no murmurs and normal pedal pulses bilaterally.  Abdomen Note: Mild epigastric tenderness to palpation.  Neurologic Neurologic evaluation reveals -alert and oriented x 3 with no impairment of recent or remote memory. Mental Status-Normal.  Musculoskeletal Normal Exam - Left-Upper Extremity Strength Normal and Lower Extremity Strength Normal. Normal Exam - Right-Upper Extremity Strength Normal and Lower Extremity Strength Normal.    Assessment & Plan (Tekeshia Klahr A. Leylah Tarnow Angelica [name]; 10/29/2017 11:18 AM)  SYMPTOMATIC CHOLELITHIASIS (K80.20) Impression: Discuss treatment options. Recommend laparoscopic cholecystectomy and cholangiogram. The procedure has been discussed with the patient. Risks of laparoscopic cholecystectomy include bleeding, infection, bile duct injury, leak, death, open surgery, diarrhea, other surgery, organ injury, blood vessel injury, DVT, and additional care.  Current Plans You are being scheduled for surgery- Our schedulers will call you.  You should hear from our office's scheduling department within 5 working days about the location, date, and time of surgery. We try to make accommodations for patient's preferences in scheduling surgery, but sometimes the OR schedule or the surgeon's schedule prevents [place] from making those accommodations.  If you have not heard from our office [phone]) in 5 working days, call the office and ask for your surgeon's nurse.  If you have other questions about your diagnosis, plan, or surgery, call the office and ask for your surgeon's nurse.  The anatomy & physiology of hepatobiliary & pancreatic function was discussed. The pathophysiology of gallbladder  dysfunction was discussed. Natural history risks without surgery was discussed. I feel the risks of no intervention will lead to serious problems that outweigh the operative risks; therefore, I recommended cholecystectomy to remove the pathology. I explained laparoscopic techniques with possible need for an open approach. Probable cholangiogram to evaluate the bilary tract was explained as well.  Risks such as bleeding, infection, abscess, leak, injury to other organs, need for further treatment, heart attack, death, and other risks were discussed. I noted a good likelihood this will help address the problem. Possibility that this will  not correct all abdominal symptoms was explained. Goals of post-operative recovery were discussed as well. We will work to minimize complications. An educational handout further explaining the pathology and treatment options was given as well. Questions were answered. The patient expresses understanding & wishes to proceed with surgery.  Written instructions provided Pt Education - [facility] Good Bowel Health Pt Education - [facility] Laparosopic Post Op HCI

## 2017-11-05 ENCOUNTER — Other Ambulatory Visit: Payer: Self-pay | Admitting: Family Medicine

## 2017-11-05 DIAGNOSIS — F419 Anxiety disorder, unspecified: Principal | ICD-10-CM

## 2017-11-05 DIAGNOSIS — F329 Major depressive disorder, single episode, unspecified: Secondary | ICD-10-CM

## 2017-11-05 DIAGNOSIS — F32A Depression, unspecified: Secondary | ICD-10-CM

## 2017-11-05 NOTE — Pre-Procedure Instructions (Signed)
Angelica [name]  11/05/2017      [facility], [address] [place] [address] Phone: [phone] Fax: [phone]    Your procedure is scheduled on Nov 12, 2017.  Report to [facility] Admitting at 1000 AM.  Call this number if you have problems the morning of surgery:  [phone]   Continue all medications as directed by your physician except follow these medication instructions before surgery below   Remember:  Do not eat food or drink liquids after midnight.  Take these medicines the morning of surgery with A SIP OF WATER  Albuterol inhaler (if needed)-bring inhaler with you Levothyroxine (synthroid) Buspirone (buspar) Clonazepam (klonopin) Dicyclomine (bentyl) escitalopram (lexapro)  7 days prior to surgery STOP taking any Aspirin (unless otherwise instructed by your surgeon), Aleve, Naproxen, Ibuprofen, Motrin, Advil, Goody's, BC's, all herbal medications, fish oil, and all vitamins   Do not wear jewelry, make-up or nail polish.  Do not wear lotions, powders, or perfumes, or deodorant.  Do not shave 48 hours prior to surgery.    Do not bring valuables to the hospital.  [facility] is not responsible for any belongings or valuables.  Contacts, dentures or bridgework may not be worn into surgery.  Leave your suitcase in the car.  After surgery it may be brought to your room.  For patients admitted to the hospital, discharge time will be determined by your treatment team.  Patients discharged the day of surgery will not be allowed to drive home.   [place]- Preparing For Surgery  Before surgery, you can play an important role. Because skin is not sterile, your skin needs to be as free of germs as possible. You can reduce the number of germs on your skin by washing with CHG (chlorahexidine gluconate) Soap before surgery.  CHG is an antiseptic cleaner which kills  germs and bonds with the skin to continue killing germs even after washing.  Oral Hygiene is also important to reduce your risk of infection.  Remember - BRUSH YOUR TEETH THE MORNING OF SURGERY  Please do not use if you have an allergy to CHG or antibacterial soaps. If your skin becomes reddened/irritated stop using the CHG.  Do not shave (including legs and underarms) for at least 48 hours prior to first CHG shower. It is OK to shave your face.  Please follow these instructions carefully.   1. Shower the NIGHT BEFORE SURGERY and the MORNING OF SURGERY with CHG.   2. If you chose to wash your hair, wash your hair first as usual with your normal shampoo.  3. After you shampoo, rinse your hair and body thoroughly to remove the shampoo.  4. Use CHG as you would any other liquid soap. You can apply CHG directly to the skin and wash gently with a scrungie or a clean washcloth.   5. Apply the CHG Soap to your body ONLY FROM THE NECK DOWN.  Do not use on open wounds or open sores. Avoid contact with your eyes, ears, mouth and genitals (private parts). Wash Face and genitals (private parts)  with your normal soap.  6. Wash thoroughly, paying special attention to the area where your surgery will be performed.  7. Thoroughly rinse your body with warm water from the neck down.  8. DO NOT shower/wash with your normal soap after using and rinsing off the  CHG Soap.  9. Pat yourself dry with a CLEAN TOWEL.  10. Wear CLEAN PAJAMAS to bed the night before surgery, wear comfortable clothes the morning of surgery  11. Place CLEAN SHEETS on your bed the night of your first shower and DO NOT SLEEP WITH PETS.  Day of Surgery:  Do not apply any deodorants/lotions.  Please wear clean clothes to the hospital/surgery center.   Remember to brush your teeth.    Please read over the following fact sheets that you were given. Pain Booklet, Coughing and Deep Breathing and Surgical Site Infection  Prevention

## 2017-11-05 NOTE — Telephone Encounter (Signed)
Routed to pcp for signature.Donni Oglesby Lynetta, CMA  

## 2017-11-06 ENCOUNTER — Encounter (HOSPITAL_COMMUNITY): Payer: Self-pay

## 2017-11-06 ENCOUNTER — Other Ambulatory Visit: Payer: Self-pay

## 2017-11-06 ENCOUNTER — Encounter (HOSPITAL_COMMUNITY)
Admission: RE | Admit: 2017-11-06 | Discharge: 2017-11-06 | Disposition: A | Discharge status: Home / Self Care | Payer: 59 | Source: Ambulatory Visit | Attending: Surgery | Admitting: Surgery

## 2017-11-06 DIAGNOSIS — K8020 Calculus of gallbladder without cholecystitis without obstruction: Secondary | ICD-10-CM | POA: Insufficient documentation

## 2017-11-06 DIAGNOSIS — Z880 Allergy status to penicillin: Secondary | ICD-10-CM | POA: Insufficient documentation

## 2017-11-06 DIAGNOSIS — Z79899 Other long term (current) drug therapy: Secondary | ICD-10-CM | POA: Diagnosis not present

## 2017-11-06 DIAGNOSIS — Z823 Family history of stroke: Secondary | ICD-10-CM | POA: Insufficient documentation

## 2017-11-06 DIAGNOSIS — Z882 Allergy status to sulfonamides status: Secondary | ICD-10-CM | POA: Insufficient documentation

## 2017-11-06 DIAGNOSIS — Z79890 Hormone replacement therapy (postmenopausal): Secondary | ICD-10-CM | POA: Insufficient documentation

## 2017-11-06 DIAGNOSIS — Z8249 Family history of ischemic heart disease and other diseases of the circulatory system: Secondary | ICD-10-CM | POA: Insufficient documentation

## 2017-11-06 DIAGNOSIS — Z883 Allergy status to other anti-infective agents status: Secondary | ICD-10-CM | POA: Insufficient documentation

## 2017-11-06 DIAGNOSIS — Z01812 Encounter for preprocedural laboratory examination: Secondary | ICD-10-CM | POA: Diagnosis present

## 2017-11-06 HISTORY — DX: Essential (primary) hypertension: I10

## 2017-11-06 HISTORY — DX: Hypothyroidism, unspecified: E03.9

## 2017-11-06 HISTORY — DX: Dyspnea, unspecified: R06.00

## 2017-11-06 LAB — CBC WITH DIFFERENTIAL/PLATELET
Abs Immature Granulocytes: 0 10*3/uL (ref 0.0–0.1)
BASOS ABS: 0.1 10*3/uL (ref 0.0–0.1)
Basophils Relative: 1 %
EOS PCT: 3 %
Eosinophils Absolute: 0.3 10*3/uL (ref 0.0–0.7)
HCT: 42 % (ref 36.0–46.0)
HEMOGLOBIN: 14.2 g/dL (ref 12.0–15.0)
Immature Granulocytes: 0 %
LYMPHS PCT: 40 %
Lymphs Abs: 3.5 10*3/uL (ref 0.7–4.0)
MCH: 28.1 pg (ref 26.0–34.0)
MCHC: 33.8 g/dL (ref 30.0–36.0)
MCV: 83.2 fL (ref 78.0–100.0)
MONO ABS: 0.5 10*3/uL (ref 0.1–1.0)
Monocytes Relative: 5 %
Neutro Abs: 4.4 10*3/uL (ref 1.7–7.7)
Neutrophils Relative %: 51 %
Platelets: 219 10*3/uL (ref 150–400)
RBC: 5.05 MIL/uL (ref 3.87–5.11)
RDW: 13.4 % (ref 11.5–15.5)
WBC: 8.7 10*3/uL (ref 4.0–10.5)

## 2017-11-06 LAB — COMPREHENSIVE METABOLIC PANEL
ALBUMIN: 4 g/dL (ref 3.5–5.0)
ALK PHOS: 110 U/L (ref 38–126)
ALT: 15 U/L (ref 14–54)
ANION GAP: 10 (ref 5–15)
AST: 18 U/L (ref 15–41)
BUN: 11 mg/dL (ref 6–20)
CALCIUM: 9.2 mg/dL (ref 8.9–10.3)
CO2: 25 mmol/L (ref 22–32)
Chloride: 108 mmol/L (ref 101–111)
Creatinine, Ser: 0.77 mg/dL (ref 0.44–1.00)
GFR calc non Af Amer: >60 mL/min (ref >60)
GLUCOSE: 104 mg/dL — AB (ref 65–99)
Potassium: 4.1 mmol/L (ref 3.5–5.1)
SODIUM: 143 mmol/L (ref 135–145)
TOTAL PROTEIN: 6.9 g/dL (ref 6.5–8.1)
Total Bilirubin: 1.2 mg/dL (ref 0.3–1.2)

## 2017-11-12 ENCOUNTER — Encounter (HOSPITAL_COMMUNITY)
Admission: RE | Disposition: A | Discharge status: Home / Self Care | Payer: Self-pay | Source: Ambulatory Visit | Attending: Surgery

## 2017-11-12 ENCOUNTER — Ambulatory Visit (HOSPITAL_COMMUNITY)
Admission: RE | Admit: 2017-11-12 | Discharge: 2017-11-12 | Disposition: A | Discharge status: Home / Self Care | Payer: 59 | Source: Ambulatory Visit | Attending: Surgery | Admitting: Surgery

## 2017-11-12 ENCOUNTER — Ambulatory Visit (HOSPITAL_COMMUNITY): Payer: 59 | Admitting: Certified Registered"

## 2017-11-12 ENCOUNTER — Ambulatory Visit (HOSPITAL_COMMUNITY): Payer: 59

## 2017-11-12 ENCOUNTER — Other Ambulatory Visit: Payer: Self-pay

## 2017-11-12 ENCOUNTER — Encounter (HOSPITAL_COMMUNITY): Payer: Self-pay | Admitting: *Deleted

## 2017-11-12 DIAGNOSIS — I700 Atherosclerosis of aorta: Secondary | ICD-10-CM | POA: Insufficient documentation

## 2017-11-12 DIAGNOSIS — Z823 Family history of stroke: Secondary | ICD-10-CM | POA: Diagnosis not present

## 2017-11-12 DIAGNOSIS — Z820 Family history of epilepsy and other diseases of the nervous system: Secondary | ICD-10-CM | POA: Insufficient documentation

## 2017-11-12 DIAGNOSIS — Z9011 Acquired absence of right breast and nipple: Secondary | ICD-10-CM | POA: Insufficient documentation

## 2017-11-12 DIAGNOSIS — K8010 Calculus of gallbladder with chronic cholecystitis without obstruction: Secondary | ICD-10-CM | POA: Diagnosis not present

## 2017-11-12 DIAGNOSIS — F419 Anxiety disorder, unspecified: Secondary | ICD-10-CM | POA: Insufficient documentation

## 2017-11-12 DIAGNOSIS — F329 Major depressive disorder, single episode, unspecified: Secondary | ICD-10-CM | POA: Diagnosis not present

## 2017-11-12 DIAGNOSIS — Z8261 Family history of arthritis: Secondary | ICD-10-CM | POA: Diagnosis not present

## 2017-11-12 DIAGNOSIS — K8020 Calculus of gallbladder without cholecystitis without obstruction: Secondary | ICD-10-CM | POA: Diagnosis not present

## 2017-11-12 DIAGNOSIS — Z87891 Personal history of nicotine dependence: Secondary | ICD-10-CM | POA: Insufficient documentation

## 2017-11-12 DIAGNOSIS — E039 Hypothyroidism, unspecified: Secondary | ICD-10-CM | POA: Insufficient documentation

## 2017-11-12 DIAGNOSIS — K828 Other specified diseases of gallbladder: Secondary | ICD-10-CM | POA: Insufficient documentation

## 2017-11-12 DIAGNOSIS — Z8249 Family history of ischemic heart disease and other diseases of the circulatory system: Secondary | ICD-10-CM | POA: Insufficient documentation

## 2017-11-12 DIAGNOSIS — I10 Essential (primary) hypertension: Secondary | ICD-10-CM | POA: Diagnosis not present

## 2017-11-12 DIAGNOSIS — Z800 Family history of malignant neoplasm of digestive organs: Secondary | ICD-10-CM | POA: Insufficient documentation

## 2017-11-12 DIAGNOSIS — Z419 Encounter for procedure for purposes other than remedying health state, unspecified: Secondary | ICD-10-CM

## 2017-11-12 DIAGNOSIS — J45909 Unspecified asthma, uncomplicated: Secondary | ICD-10-CM | POA: Diagnosis not present

## 2017-11-12 HISTORY — PX: CHOLECYSTECTOMY: SHX55

## 2017-11-12 SURGERY — LAPAROSCOPIC CHOLECYSTECTOMY WITH INTRAOPERATIVE CHOLANGIOGRAM
Anesthesia: General | Site: Abdomen

## 2017-11-12 MED ORDER — OXYCODONE HCL 5 MG PO TABS: 5.0000 mg | ORAL_TABLET | Freq: Four times a day (QID) | ORAL | 0 refills | Status: DC | PRN

## 2017-11-12 MED ORDER — OXYCODONE HCL 5 MG PO TABS
ORAL_TABLET | ORAL | Status: AC
  Filled 2017-11-12: qty 1

## 2017-11-12 MED ORDER — 0.9 % SODIUM CHLORIDE (POUR BTL) OPTIME
TOPICAL | Status: DC | PRN
  Administered 2017-11-12: 1000 mL

## 2017-11-12 MED ORDER — SODIUM CHLORIDE 0.9 % IV SOLN
INTRAVENOUS | Status: DC | PRN
  Administered 2017-11-12: 6 mL

## 2017-11-12 MED ORDER — SUGAMMADEX SODIUM 200 MG/2ML IV SOLN
INTRAVENOUS | Status: DC | PRN
  Administered 2017-11-12: 200 mg via INTRAVENOUS

## 2017-11-12 MED ORDER — PHENYLEPHRINE 40 MCG/ML (10ML) SYRINGE FOR IV PUSH (FOR BLOOD PRESSURE SUPPORT)
PREFILLED_SYRINGE | INTRAVENOUS | Status: DC | PRN
  Administered 2017-11-12 (×2): 120 ug via INTRAVENOUS

## 2017-11-12 MED ORDER — HYDROMORPHONE HCL 2 MG/ML IJ SOLN
INTRAMUSCULAR | Status: AC
  Filled 2017-11-12: qty 1

## 2017-11-12 MED ORDER — SODIUM CHLORIDE 0.9 % IR SOLN
Status: DC | PRN
  Administered 2017-11-12: 1000 mL

## 2017-11-12 MED ORDER — IBUPROFEN 800 MG PO TABS: 800.0000 mg | ORAL_TABLET | Freq: Three times a day (TID) | ORAL | 0 refills | Status: DC | PRN

## 2017-11-12 MED ORDER — MIDAZOLAM HCL 2 MG/2ML IJ SOLN
INTRAMUSCULAR | Status: AC
  Filled 2017-11-12: qty 2

## 2017-11-12 MED ORDER — ROCURONIUM BROMIDE 10 MG/ML (PF) SYRINGE
PREFILLED_SYRINGE | INTRAVENOUS | Status: DC | PRN
  Administered 2017-11-12: 40 mg via INTRAVENOUS

## 2017-11-12 MED ORDER — FENTANYL CITRATE (PF) 250 MCG/5ML IJ SOLN
INTRAMUSCULAR | Status: AC
  Filled 2017-11-12: qty 5

## 2017-11-12 MED ORDER — BUPIVACAINE-EPINEPHRINE (PF) 0.25% -1:200000 IJ SOLN
INTRAMUSCULAR | Status: AC
  Filled 2017-11-12: qty 30

## 2017-11-12 MED ORDER — PROPOFOL 10 MG/ML IV BOLUS
INTRAVENOUS | Status: DC | PRN
  Administered 2017-11-12: 100 mg via INTRAVENOUS

## 2017-11-12 MED ORDER — CHLORHEXIDINE GLUCONATE CLOTH 2 % EX PADS: 6.0000 | MEDICATED_PAD | Freq: Once | CUTANEOUS | Status: DC

## 2017-11-12 MED ORDER — ONDANSETRON HCL 4 MG/2ML IJ SOLN
INTRAMUSCULAR | Status: DC | PRN
  Administered 2017-11-12: 4 mg via INTRAVENOUS

## 2017-11-12 MED ORDER — IOPAMIDOL (ISOVUE-300) INJECTION 61%
INTRAVENOUS | Status: AC
  Filled 2017-11-12: qty 50

## 2017-11-12 MED ORDER — HYDROMORPHONE HCL 2 MG/ML IJ SOLN
0.2500 mg | INTRAMUSCULAR | Status: DC | PRN
  Administered 2017-11-12 (×4): 0.5 mg via INTRAVENOUS

## 2017-11-12 MED ORDER — LACTATED RINGERS IV SOLN
INTRAVENOUS | Status: DC
  Administered 2017-11-12 (×2): via INTRAVENOUS

## 2017-11-12 MED ORDER — OXYCODONE HCL 5 MG PO TABS
5.0000 mg | ORAL_TABLET | Freq: Once | ORAL | Status: AC | PRN
  Administered 2017-11-12: 5 mg via ORAL

## 2017-11-12 MED ORDER — OXYCODONE HCL 5 MG/5ML PO SOLN: 5.0000 mg | Freq: Once | ORAL | Status: AC | PRN

## 2017-11-12 MED ORDER — LIDOCAINE 2% (20 MG/ML) 5 ML SYRINGE
INTRAMUSCULAR | Status: DC | PRN
  Administered 2017-11-12: 60 mg via INTRAVENOUS

## 2017-11-12 MED ORDER — DEXAMETHASONE SODIUM PHOSPHATE 10 MG/ML IJ SOLN
INTRAMUSCULAR | Status: DC | PRN
  Administered 2017-11-12: 10 mg via INTRAVENOUS

## 2017-11-12 MED ORDER — BUPIVACAINE-EPINEPHRINE 0.25% -1:200000 IJ SOLN
INTRAMUSCULAR | Status: DC | PRN
  Administered 2017-11-12: 5 mL

## 2017-11-12 MED ORDER — MIDAZOLAM HCL 2 MG/2ML IJ SOLN
INTRAMUSCULAR | Status: DC | PRN
  Administered 2017-11-12: 2 mg via INTRAVENOUS

## 2017-11-12 MED ORDER — PROPOFOL 10 MG/ML IV BOLUS
INTRAVENOUS | Status: AC
  Filled 2017-11-12: qty 20

## 2017-11-12 MED ORDER — FENTANYL CITRATE (PF) 100 MCG/2ML IJ SOLN
INTRAMUSCULAR | Status: DC | PRN
  Administered 2017-11-12: 100 ug via INTRAVENOUS

## 2017-11-12 MED ORDER — PHENYLEPHRINE 40 MCG/ML (10ML) SYRINGE FOR IV PUSH (FOR BLOOD PRESSURE SUPPORT)
PREFILLED_SYRINGE | INTRAVENOUS | Status: AC
  Filled 2017-11-12: qty 10

## 2017-11-12 MED ORDER — CLINDAMYCIN PHOSPHATE 900 MG/50ML IV SOLN
900.0000 mg | INTRAVENOUS | Status: AC
  Administered 2017-11-12: 900 mg via INTRAVENOUS
  Filled 2017-11-12: qty 50

## 2017-11-12 SURGICAL SUPPLY — 47 items
ADH SKN CLS APL DERMABOND .7 (GAUZE/BANDAGES/DRESSINGS) ×1
ADH SKN CLS LQ APL DERMABOND (GAUZE/BANDAGES/DRESSINGS) ×1
APPLIER CLIP ROT 10 11.4 M/L (STAPLE) ×2
APR CLP MED LRG 11.4X10 (STAPLE) ×1
BAG SPEC RTRVL LRG 6X4 10 (ENDOMECHANICALS) ×1
BLADE CLIPPER SURG (BLADE) IMPLANT
CANISTER SUCT 3000ML PPV (MISCELLANEOUS) ×2 IMPLANT
CHLORAPREP W/TINT 26ML (MISCELLANEOUS) ×2 IMPLANT
CLIP APPLIE ROT 10 11.4 M/L (STAPLE) ×1 IMPLANT
COVER MAYO STAND STRL (DRAPES) ×2 IMPLANT
COVER SURGICAL LIGHT HANDLE (MISCELLANEOUS) ×2 IMPLANT
DERMABOND ADHESIVE PROPEN (GAUZE/BANDAGES/DRESSINGS) ×1
DERMABOND ADVANCED (GAUZE/BANDAGES/DRESSINGS) ×1
DERMABOND ADVANCED .7 DNX12 (GAUZE/BANDAGES/DRESSINGS) ×1 IMPLANT
DERMABOND ADVANCED .7 DNX6 (GAUZE/BANDAGES/DRESSINGS) IMPLANT
DRAPE C-ARM 42X72 X-RAY (DRAPES) ×2 IMPLANT
DRAPE HALF SHEET 40X57 (DRAPES) ×1 IMPLANT
DRAPE WARM FLUID 44X44 (DRAPE) ×2 IMPLANT
ELECT REM PT RETURN 9FT ADLT (ELECTROSURGICAL) ×2
ELECTRODE REM PT RTRN 9FT ADLT (ELECTROSURGICAL) ×1 IMPLANT
GLOVE BIO SURGEON STRL SZ 6.5 (GLOVE) ×1 IMPLANT
GLOVE BIO SURGEON STRL SZ8 (GLOVE) ×2 IMPLANT
GLOVE BIOGEL PI IND STRL 8 (GLOVE) ×1 IMPLANT
GLOVE BIOGEL PI INDICATOR 8 (GLOVE) ×1
GOWN STRL REUS W/ TWL LRG LVL3 (GOWN DISPOSABLE) ×2 IMPLANT
GOWN STRL REUS W/ TWL XL LVL3 (GOWN DISPOSABLE) ×1 IMPLANT
GOWN STRL REUS W/TWL LRG LVL3 (GOWN DISPOSABLE) ×4
GOWN STRL REUS W/TWL XL LVL3 (GOWN DISPOSABLE) ×2
KIT BASIN OR (CUSTOM PROCEDURE TRAY) ×2 IMPLANT
KIT TURNOVER KIT B (KITS) ×2 IMPLANT
NS IRRIG 1000ML POUR BTL (IV SOLUTION) ×2 IMPLANT
PAD ARMBOARD 7.5X6 YLW CONV (MISCELLANEOUS) ×2 IMPLANT
POUCH SPECIMEN RETRIEVAL 10MM (ENDOMECHANICALS) ×2 IMPLANT
SCISSORS LAP 5X35 DISP (ENDOMECHANICALS) ×2 IMPLANT
SET CHOLANGIOGRAPH 5 50 .035 (SET/KITS/TRAYS/PACK) ×2 IMPLANT
SET IRRIG TUBING LAPAROSCOPIC (IRRIGATION / IRRIGATOR) ×2 IMPLANT
SLEEVE ENDOPATH XCEL 5M (ENDOMECHANICALS) ×2 IMPLANT
SPECIMEN JAR SMALL (MISCELLANEOUS) ×2 IMPLANT
SUT MNCRL AB 4-0 PS2 18 (SUTURE) ×2 IMPLANT
TOWEL OR 17X24 6PK STRL BLUE (TOWEL DISPOSABLE) ×2 IMPLANT
TOWEL OR 17X26 10 PK STRL BLUE (TOWEL DISPOSABLE) ×2 IMPLANT
TRAY LAPAROSCOPIC MC (CUSTOM PROCEDURE TRAY) ×2 IMPLANT
TROCAR XCEL BLUNT TIP 100MML (ENDOMECHANICALS) ×2 IMPLANT
TROCAR XCEL NON-BLD 11X100MML (ENDOMECHANICALS) ×2 IMPLANT
TROCAR XCEL NON-BLD 5MMX100MML (ENDOMECHANICALS) ×2 IMPLANT
TUBING INSUFFLATION (TUBING) ×2 IMPLANT
WATER STERILE IRR 1000ML POUR (IV SOLUTION) ×2 IMPLANT

## 2017-11-12 NOTE — Anesthesia Preprocedure Evaluation (Signed)
Anesthesia Evaluation  Patient identified by MRN, date of birth, ID band Patient awake    Reviewed: Allergy & Precautions, NPO status , Patient's Chart, lab work & pertinent test results  Airway Mallampati: II  TM Distance: >3 FB Neck ROM: Full    Dental  (+) Poor Dentition, Missing   Pulmonary shortness of breath, asthma , former smoker,    breath sounds clear to auscultation       Cardiovascular hypertension,  Rhythm:Regular Rate:Normal     Neuro/Psych  Neuromuscular disease    GI/Hepatic   Endo/Other  Hypothyroidism   Renal/GU      Musculoskeletal   Abdominal   Peds  Hematology   Anesthesia Other Findings   Reproductive/Obstetrics                             Anesthesia Physical Anesthesia Plan  ASA: III  Anesthesia Plan: General   Post-op Pain Management:    Induction: Intravenous  PONV Risk Score and Plan: 4 or greater and Ondansetron and Dexamethasone  Airway Management Planned:   Additional Equipment:   Intra-op Plan:   Post-operative Plan: Extubation in OR  Informed Consent: I have reviewed the patients History and Physical, chart, labs and discussed the procedure including the risks, benefits and alternatives for the proposed anesthesia with the patient or authorized representative who has indicated his/her understanding and acceptance.     Plan Discussed with:   Anesthesia Plan Comments:         Anesthesia Quick Evaluation

## 2017-11-12 NOTE — Interval H&P Note (Signed)
History and Physical Interval Note:  11/12/2017 11:53 AM  Angelica [name]  has presented today for surgery, with the diagnosis of gallstones  The various methods of treatment have been discussed with the patient and family. After consideration of risks, benefits and other options for treatment, the patient has consented to  Procedure(s): LAPAROSCOPIC CHOLECYSTECTOMY WITH INTRAOPERATIVE CHOLANGIOGRAM ERAS PATHWAY (N/A) as a surgical intervention .  The patient's history has been reviewed, patient examined, no change in status, stable for surgery.  I have reviewed the patient's chart and labs.  Questions were answered to the patient's satisfaction.     [place]

## 2017-11-12 NOTE — Op Note (Signed)
Laparoscopic Cholecystectomy with IOC Procedure Note  Indications: This patient presents with symptomatic gallbladder disease and will undergo laparoscopic cholecystectomy.The procedure has been discussed with the patient. Operative and non operative treatments have been discussed. Risks of surgery include bleeding, infection,  Common bile duct injury,  Injury to the stomach,liver, colon,small intestine, abdominal wall,  Diaphragm,  Major blood vessels,  And the need for an open procedure.  Other risks include worsening of medical problems, death,  DVT and pulmonary embolism, and cardiovascular events.   Medical options have also been discussed. The patient has been informed of long term expectations of surgery and non surgical options,  The patient agrees to proceed.    Pre-operative Diagnosis: Calculus of gallbladder without mention of cholecystitis or obstruction  Post-operative Diagnosis: Same  Surgeon: Joyice Faster Chandria Rookstool   Assistants: none   Anesthesia: General endotracheal anesthesia and Local anesthesia 0.25.% bupivacaine  ASA Class: 2  Procedure Details  The patient was seen again in the Holding Room. The risks, benefits, complications, treatment options, and expected outcomes were discussed with the patient. The possibilities of reaction to medication, pulmonary aspiration, perforation of viscus, bleeding, recurrent infection, finding a normal gallbladder, the need for additional procedures, failure to diagnose a condition, the possible need to convert to an open procedure, and creating a complication requiring transfusion or operation were discussed with the patient. The patient and/or family concurred with the proposed plan, giving informed consent. The site of surgery properly noted/marked. The patient was taken to Operating Room, identified as Angelica [name] and the procedure verified as Laparoscopic Cholecystectomy with Intraoperative Cholangiograms. A Time Out was held and the above  information confirmed.  Prior to the induction of general anesthesia, antibiotic prophylaxis was administered. General endotracheal anesthesia was then administered and tolerated well. After the induction, the abdomen was prepped in the usual sterile fashion. The patient was positioned in the supine position with the left arm comfortably tucked, along with some reverse Trendelenburg.  Local anesthetic agent was injected into the skin near the umbilicus and an incision made. The midline fascia was incised and the Hasson technique was used to introduce a 12 mm port under direct vision. It was secured with a figure of eight Vicryl suture placed in the usual fashion. Pneumoperitoneum was then created with CO2 and tolerated well without any adverse changes in the patient's vital signs. Additional trocars were introduced under direct vision with an 11 mm trocar in the epigastrium and 2 5 mm trocars in the right upper quadrant. All skin incisions were infiltrated with a local anesthetic agent before making the incision and placing the trocars.   The gallbladder was identified, the fundus grasped and retracted cephalad. Adhesions were lysed bluntly and with the electrocautery where indicated, taking care not to injure any adjacent organs or viscus. The infundibulum was grasped and retracted laterally, exposing the peritoneum overlying the triangle of Calot. This was then divided and exposed in a blunt fashion. The cystic duct was clearly identified and bluntly dissected circumferentially. The junctions of the gallbladder, cystic duct and common bile duct were clearly identified prior to the division of any linear structure.   An incision was made in the cystic duct and the cholangiogram catheter introduced. The catheter was secured using an endoclip. The study showed no stones and good visualization of the distal and proximal biliary tree. The catheter was then removed.   The cystic duct was then  ligated with  surgical clips  on the patient side and  clipped on the gallbladder side and divided. The cystic artery was identified, dissected free, ligated with clips and divided as well. Posterior cystic artery clipped and divided.  The gallbladder was dissected from the liver bed in retrograde fashion with the electrocautery. The gallbladder was removed. The liver bed was irrigated and inspected. Hemostasis was achieved with the electrocautery. Copious irrigation was utilized and was repeatedly aspirated until clear all particulate matter. Hemostasis was achieved with no signs  Of bleeding or bile leakage.  Pneumoperitoneum was completely reduced after viewing removal of the trocars under direct vision. The wound was thoroughly irrigated and the fascia was then closed with a figure of eight suture; the skin was then closed with 4 O monocryl  and a sterile dressing was applied.  Instrument, sponge, and needle counts were correct at closure and at the conclusion of the case.   Findings:   Cholelithiasis  Estimated Blood Loss: less than 50 mL         Drains: none          Total IV Fluids: per record          Specimens: Gallbladder           Complications: None; patient tolerated the procedure well.         Disposition: PACU - hemodynamically stable.         Condition: stable

## 2017-11-12 NOTE — Anesthesia Procedure Notes (Signed)
Procedure Name: Intubation Date/Time: 11/12/2017 12:12 PM Performed by: Rica Koyanagi, MD Pre-anesthesia Checklist: Patient identified, Emergency Drugs available, Suction available and Patient being monitored Patient Re-evaluated:Patient Re-evaluated prior to induction Oxygen Delivery Method: Circle System Utilized Preoxygenation: Pre-oxygenation with 100% oxygen Induction Type: IV induction Ventilation: Mask ventilation without difficulty and Oral airway inserted - appropriate to patient size Laryngoscope Size: Mac and 3 Grade View: Grade I Tube type: Oral Tube size: 7.0 mm Number of attempts: 1 Airway Equipment and Method: Stylet and Oral airway Placement Confirmation: ETT inserted through vocal cords under direct vision,  positive ETCO2 and breath sounds checked- equal and bilateral Secured at: 22 cm Tube secured with: Tape Dental Injury: Teeth and Oropharynx as per pre-operative assessment  Comments: Dewitt Hoes, SRNA intubated patient under CRNA and MD supervision

## 2017-11-12 NOTE — Discharge Instructions (Signed)
[facility] ______[facility] °LAPAROSCOPIC SURGERY: POST OP INSTRUCTIONS °Always review your discharge instruction sheet given to you by the facility where your surgery was performed. °IF YOU HAVE DISABILITY OR FAMILY LEAVE FORMS, YOU MUST BRING THEM TO THE OFFICE FOR PROCESSING.   °DO NOT GIVE THEM TO YOUR DOCTOR. ° °1. A prescription for pain medication may be given to you upon discharge.  Take your pain medication as prescribed, if needed.  If narcotic pain medicine is not needed, then you may take acetaminophen (Tylenol) or ibuprofen (Advil) as needed. °2. Take your usually prescribed medications unless otherwise directed. °3. If you need a refill on your pain medication, please contact your pharmacy.  They will contact our office to request authorization. Prescriptions will not be filled after 5pm or on week-ends. °4. You should follow a light diet the first few days after arrival home, such as soup and crackers, etc.  Be sure to include lots of fluids daily. °5. Most patients will experience some swelling and bruising in the area of the incisions.  Ice packs will help.  Swelling and bruising can take several days to resolve.  °6. It is common to experience some constipation if taking pain medication after surgery.  Increasing fluid intake and taking a stool softener (such as Colace) will usually help or prevent this problem from occurring.  A mild laxative (Milk of Magnesia or Miralax) should be taken according to package instructions if there are no bowel movements after 48 hours. °7. Unless discharge instructions indicate otherwise, you may remove your bandages 24-48 hours after surgery, and you may shower at that time.  You may have steri-strips (small skin tapes) in place directly over the incision.  These strips should be left on the skin for 7-10 days.  If your surgeon used skin glue on the incision, you may shower in 24 hours.  The glue will flake off over the next 2-3 weeks.  Any sutures or  staples will be removed at the office during your follow-up visit. °8. ACTIVITIES:  You may resume regular (light) daily activities beginning the next day--such as daily self-care, walking, climbing stairs--gradually increasing activities as tolerated.  You may have sexual intercourse when it is comfortable.  Refrain from any heavy lifting or straining until approved by your doctor. °a. You may drive when you are no longer taking prescription pain medication, you can comfortably wear a seatbelt, and you can safely maneuver your car and apply brakes. °b. RETURN TO WORK:  __________________________________________________________ °9. You should see your doctor in the office for a follow-up appointment approximately 2-3 weeks after your surgery.  Make sure that you call for this appointment within a day or two after you arrive home to insure a convenient appointment time. °10. OTHER INSTRUCTIONS: __________________________________________________________________________________________________________________________ __________________________________________________________________________________________________________________________ °WHEN TO CALL YOUR DOCTOR: °1. Fever over 101.0 °2. Inability to urinate °3. Continued bleeding from incision. °4. Increased pain, redness, or drainage from the incision. °5. Increasing abdominal pain ° °The clinic staff is available to answer your questions during regular business hours.  Please don’t hesitate to call and ask to speak to one of the nurses for clinical concerns.  If you have a medical emergency, go to the nearest emergency room or call 911.  A surgeon from [facility] is always on call at the hospital. °[address], [address], [place], [place]  [address] ? [address] [address], [place], [place]   [address] °[phone] ? [phone] ? FAX [phone] °Web site:   [email or web address] °

## 2017-11-12 NOTE — Progress Notes (Signed)
Report given to maria rn as caregiver 

## 2017-11-12 NOTE — Transfer of Care (Signed)
Immediate Anesthesia Transfer of Care Note  Patient: Angelica [name]  Procedure(s) Performed: LAPAROSCOPIC CHOLECYSTECTOMY WITH INTRAOPERATIVE CHOLANGIOGRAM ERAS PATHWAY (N/A Abdomen)  Patient Location: PACU  Anesthesia Type:General  Level of Consciousness: awake, alert  and oriented  Airway & Oxygen Therapy: Patient Spontanous Breathing and Patient connected to nasal cannula oxygen  Post-op Assessment: Report given to RN and Post -op Vital signs reviewed and stable  Post vital signs: Reviewed and stable  Last Vitals:  Vitals Value Taken Time  BP 144/63 11/12/2017  1:46 PM  Temp    Pulse 80 11/12/2017  1:46 PM  Resp 17 11/12/2017  1:46 PM  SpO2 94 % 11/12/2017  1:46 PM  Vitals shown include unvalidated device data.  Last Pain:  Vitals:   11/12/17 1031  TempSrc:   PainSc: 6       Patients Stated Pain Goal: 3 (49/44/96 7591)  Complications: No apparent anesthesia complications

## 2017-11-13 ENCOUNTER — Encounter (HOSPITAL_COMMUNITY): Payer: Self-pay | Admitting: Surgery

## 2017-11-13 NOTE — Anesthesia Postprocedure Evaluation (Signed)
Anesthesia Post Note  Patient: Angelica [name]  Procedure(s) Performed: LAPAROSCOPIC CHOLECYSTECTOMY WITH INTRAOPERATIVE CHOLANGIOGRAM ERAS PATHWAY (N/A Abdomen)     Patient location during evaluation: PACU Anesthesia Type: General Level of consciousness: awake, sedated and oriented Pain management: pain level controlled Vital Signs Assessment: post-procedure vital signs reviewed and stable Respiratory status: spontaneous breathing, nonlabored ventilation, respiratory function stable and patient connected to nasal cannula oxygen Cardiovascular status: blood pressure returned to baseline and stable Postop Assessment: no apparent nausea or vomiting Anesthetic complications: no    Last Vitals:  Vitals:   11/12/17 1600 11/12/17 1601  BP:  (!) 120/37  Pulse: 70 72  Resp: 12 15  Temp:  36.5 C  SpO2: 91% 92%    Last Pain:  Vitals:   11/12/17 1601  TempSrc:   PainSc: 0-No pain                 Braxson Hollingsworth,JAMES TERRILL

## 2018-01-05 ENCOUNTER — Telehealth: Payer: Self-pay

## 2018-01-05 ENCOUNTER — Encounter: Payer: Self-pay | Admitting: Physician Assistant

## 2018-01-05 NOTE — Telephone Encounter (Signed)
Angelica [name] complains of diarrhea post gall bladder surgery. She has taking Pepto and Omeprazole without relief. The GI did give her some medication but it was $200 so she didn't pick it up. She doesn't remember the name. She would like [name]'s recommendations.

## 2018-01-06 ENCOUNTER — Encounter: Payer: Self-pay | Admitting: Physician Assistant

## 2018-01-06 MED ORDER — CHOLESTYRAMINE 4 G PO PACK: 4.0000 g | PACK | Freq: Two times a day (BID) | ORAL | 12 refills | Status: DC

## 2018-01-06 NOTE — Telephone Encounter (Signed)
I sent her a my chart msg

## 2018-01-18 ENCOUNTER — Encounter: Payer: Self-pay | Admitting: Physician Assistant

## 2018-01-18 DIAGNOSIS — I10 Essential (primary) hypertension: Secondary | ICD-10-CM

## 2018-01-18 DIAGNOSIS — J4520 Mild intermittent asthma, uncomplicated: Secondary | ICD-10-CM

## 2018-01-19 MED ORDER — ALBUTEROL SULFATE HFA 108 (90 BASE) MCG/ACT IN AERS: 2.0000 | INHALATION_SPRAY | Freq: Four times a day (QID) | RESPIRATORY_TRACT | 2 refills | Status: DC | PRN

## 2018-01-19 MED ORDER — LISINOPRIL 20 MG PO TABS: ORAL_TABLET | ORAL | 0 refills | Status: DC

## 2018-02-04 DIAGNOSIS — Z9049 Acquired absence of other specified parts of digestive tract: Secondary | ICD-10-CM | POA: Diagnosis not present

## 2018-02-04 DIAGNOSIS — Z888 Allergy status to other drugs, medicaments and biological substances status: Secondary | ICD-10-CM | POA: Diagnosis not present

## 2018-02-04 DIAGNOSIS — R42 Dizziness and giddiness: Secondary | ICD-10-CM | POA: Diagnosis not present

## 2018-02-04 DIAGNOSIS — Z7982 Long term (current) use of aspirin: Secondary | ICD-10-CM | POA: Diagnosis not present

## 2018-02-04 DIAGNOSIS — Z882 Allergy status to sulfonamides status: Secondary | ICD-10-CM | POA: Diagnosis not present

## 2018-02-04 DIAGNOSIS — I10 Essential (primary) hypertension: Secondary | ICD-10-CM | POA: Diagnosis not present

## 2018-02-04 DIAGNOSIS — Z79899 Other long term (current) drug therapy: Secondary | ICD-10-CM | POA: Diagnosis not present

## 2018-02-04 DIAGNOSIS — Z853 Personal history of malignant neoplasm of breast: Secondary | ICD-10-CM | POA: Diagnosis not present

## 2018-02-04 DIAGNOSIS — R110 Nausea: Secondary | ICD-10-CM | POA: Diagnosis not present

## 2018-02-04 DIAGNOSIS — Z880 Allergy status to penicillin: Secondary | ICD-10-CM | POA: Diagnosis not present

## 2018-02-04 DIAGNOSIS — R55 Syncope and collapse: Secondary | ICD-10-CM | POA: Diagnosis not present

## 2018-02-05 ENCOUNTER — Telehealth: Payer: Self-pay

## 2018-02-05 NOTE — Telephone Encounter (Signed)
I am on PAL all day tomorrow. Perhaps we can doublebook her with someone else?

## 2018-02-05 NOTE — Telephone Encounter (Signed)
Sounds good

## 2018-02-05 NOTE — Telephone Encounter (Signed)
Pt called reporting that she went to [facility] yesterday to have all of her top teeth pulled. Pt does report having anxiety and during the procedure, her BP spiked to over 200. Pt states [facility] called 911 and pt passed out in ambulance.   ER was able to get pt's BP down and recommended she follow up with PCP ASAP. Pt does not have BP machine at home and has not been able to check it since being home.   Pt wanting to see [name], but no opening due to her being out of town. I have tentatively schedule pt's to follow up with Evlyn Clines tomorrow just to make sure BP is controlled, then can do an ER follow up appt with PCP when she returns to office.   Wanted to check with both [place] to make sure this is okay, or if I need to call pt and set up any different appointment.   Pt aware that she was scheduled but it is subject to change due to providers recommendations. Please advise.   Thanks!

## 2018-02-05 NOTE — Telephone Encounter (Signed)
Spoke with Pt and her daughter. She doesn't want to see the nurse, she would rather see a Provider. Added her to an acute slot tomorrow with Dr Sheppard Coil for BP check only. She is aware she needs to follow up with PCP for other concerns.

## 2018-02-05 NOTE — Telephone Encounter (Signed)
She could come in for nurse visit today for BP check since she has no way to check. If BP better she could certainly wait longer for appt.

## 2018-02-06 ENCOUNTER — Encounter: Payer: Self-pay | Admitting: Osteopathic Medicine

## 2018-02-06 ENCOUNTER — Ambulatory Visit (INDEPENDENT_AMBULATORY_CARE_PROVIDER_SITE_OTHER): Payer: BLUE CROSS/BLUE SHIELD | Admitting: Osteopathic Medicine

## 2018-02-06 ENCOUNTER — Ambulatory Visit: Payer: 59 | Admitting: Physician Assistant

## 2018-02-06 VITALS — BP 179/90 | HR 60 | Temp 98.3°F | Wt 171.5 lb

## 2018-02-06 DIAGNOSIS — I10 Essential (primary) hypertension: Secondary | ICD-10-CM

## 2018-02-06 MED ORDER — OXYCODONE HCL 5 MG PO TABS: 5.0000 mg | ORAL_TABLET | Freq: Four times a day (QID) | ORAL | 0 refills | Status: DC | PRN

## 2018-02-06 NOTE — Patient Instructions (Signed)
   Will try pain medications to help pain and hopefully then blood pressure will improve. Will have follow-up with [name] next week to recheck blood pressure.

## 2018-02-06 NOTE — Progress Notes (Signed)
HPI: Angelica [name] is a 67 y.o. female who  has a past medical history of Allergy, Anxiety, Asthma, Breast cancer (Levasy) (2009), Depression, Dyspnea, Hypertension, Hypothyroidism, Postmenopausal, and Thyroid disease.  she presents to [facility] today, 02/06/18,  for chief complaint of:  HTN  2 days ago seen in [facility] emergency department in [place] after having some teeth pulled of the dentist and having a reaction to that. Blood pressure was up to 200s in the dentist office.  Witnessed syncopal event by EMS.  Was 152/105 on ER triage, 175/91 before leaving the ER.   Patient was not giving any pain meds, she states her mouth and gums are in significant pain, frontal headache as [name].  History of hypertension, taking lisinopril 20 mg daily.  Blood pressure still high today.  She has no way of checking it at home.  She reports associated symptom of headache.  11/12/2017 for elective cholecystectomy, blood pressure then was 140/86  Patient is accompanied by daughter who assists with history-taking.    Past medical history, surgical history, and family history reviewed.  Current medication list and allergy/intolerance information reviewed.   (See remainder of HPI, ROS, Phys Exam below)  BP (!) 179/90 (BP Location: Left Arm, Patient Position: Sitting, Cuff Size: Normal)   Pulse 60   Temp 98.3 F (36.8 C) (Oral)   Wt 171 lb 8 oz (77.8 kg)   LMP 06/24/2000   BMI 30.38 kg/m       ASSESSMENT/PLAN: Elevated blood pressure, certainly likely stress reaction/pain related at this time.  Blood pressure was in a decent range several months ago on same medications.  Discussed patient we certainly have the option to increase or add blood pressure medications right now, she would rather just treat pain and see how this does.  Limited prescription opiate pain medication sent.  Mouth examined, no apparent infection or complications from dental procedure.  Plan to  follow-up early next week for blood pressure recheck again.  Unfortunately, PCP and I will both be out of the office, whoever is available is fine.  If blood pressure still high, would recommend increase lisinopril or add something like HCTZ  Essential hypertension, benign   Meds ordered this encounter  Medications  . oxyCODONE (OXY IR/ROXICODONE) 5 MG immediate release tablet    Sig: Take 1-2 tablets (5-10 mg total) by mouth every 6 (six) hours as needed for severe pain.    Dispense:  15 tablet    Refill:  0    Patient Instructions   Will try pain medications to help pain and hopefully then blood pressure will improve. Will have follow-up with [name] next week to recheck blood pressure.    Follow-up plan: Return for recheck BP next week                       ############################################ ############################################ ############################################ ############################################    Outpatient Encounter Medications as of 02/06/2018  Medication Sig  . albuterol (PROAIR HFA) 108 (90 Base) MCG/ACT inhaler Inhale 2-4 puffs into the lungs every 6 (six) hours as needed for wheezing or shortness of breath.  [name] atorvastatin (LIPITOR) 40 MG tablet Take 1 tablet (40 mg total) by mouth daily.  . busPIRone (BUSPAR) 10 MG tablet Take 1 tablet (10 mg total) by mouth 3 (three) times daily.  . clonazePAM (KLONOPIN) 0.5 MG tablet TAKE 1 TABLET BY MOUTH TWICE DAILY AS NEEDED FOR ANXIETY  . dicyclomine (BENTYL) 10 MG  capsule Take 1 capsule (10 mg total) by mouth 2 (two) times daily.  [name] escitalopram (LEXAPRO) 20 MG tablet TAKE 1 TABLET(20 MG) BY MOUTH DAILY  . ibuprofen (ADVIL,MOTRIN) 800 MG tablet Take 1 tablet (800 mg total) by mouth every 8 (eight) hours as needed.  [name] levothyroxine (SYNTHROID, LEVOTHROID) 100 MCG tablet Take 1 tablet (100 mcg total) by mouth daily.  [name] lisinopril (PRINIVIL,ZESTRIL) 20 MG tablet TAKE 1  TABLET(20 MG) BY MOUTH DAILY  . oxyCODONE (OXY IR/ROXICODONE) 5 MG immediate release tablet Take 1 tablet (5 mg total) by mouth every 6 (six) hours as needed for severe pain.  [name] temazepam (RESTORIL) 30 MG capsule Take 1 capsule (30 mg total) by mouth at bedtime as needed for sleep.  . cholestyramine (QUESTRAN) 4 g packet Take 1 packet (4 g total) by mouth 2 (two) times daily. (Patient not taking: Reported on 02/06/2018)  . [DISCONTINUED] oxybutynin (DITROPAN) 5 MG tablet Take 1 tablet (5 mg total) by mouth 2 (two) times daily.   Facility-Administered Encounter Medications as of 02/06/2018  Medication  . ipratropium-albuterol (DUONEB) 0.5-2.5 (3) MG/3ML nebulizer solution 3 mL   Allergies  Allergen Reactions  . Penicillins Hives, Swelling, Rash and Other (See Comments)    PATIENT HAS HAD A PCN REACTION WITH IMMEDIATE RASH, FACIAL/TONGUE/THROAT SWELLING, SOB, OR LIGHTHEADEDNESS WITH HYPOTENSION:  #  #  YES  #  # Has patient had a PCN reaction causing severe rash involving mucus membranes or skin necrosis: NO PATIENT HAS HAD A PCN REACTION THAT REQUIRED HOSPITALIZATION:  #  #  YES  #  #  Has patient had a PCN reaction occurring within the last 10 years: NO    . Red Dye Hives  . Sulfa Antibiotics Nausea And Vomiting and Hives  . Amitriptyline Other (See Comments)    Crazy dreams    . Macrodantin Nausea And Vomiting  . Nitrofurantoin Nausea And Vomiting      Review of Systems:  Constitutional: No recent illness  HEENT: +headache, no vision change  Cardiac: No  chest pain, No  pressure, No palpitations  Respiratory:  No  shortness of breath. No  Cough  Gastrointestinal: No  abdominal pain, no change on bowel habits  Musculoskeletal: No new myalgia/arthralgia  Skin: No  Rash, +bruising around mouth  Neurologic: No  weakness, No  Dizziness  Psychiatric: No  concerns with depression, +concerns with anxiety  Exam:  BP (!) 172/89 (BP Location: Left Arm, Patient Position: Sitting,  Cuff Size: Normal)   Pulse 62   Temp 98.3 F (36.8 C) (Oral)   Wt 171 lb 8 oz (77.8 kg)   LMP 06/24/2000   BMI 30.38 kg/m   Constitutional: VS see above. General Appearance: alert, [name]-developed, [name]-nourished, NAD  Eyes: Normal lids and conjunctive, non-icteric sclera  Ears, Nose, Mouth, Throat: MMM, signs of gum inflammation from recent dental procedure but no abscess/infection noted, no foul odor.  Neck: No masses, trachea midline.   Respiratory: Normal respiratory effort. no wheeze, no rhonchi, no rales  Cardiovascular: S1/S2 normal, no murmur, no rub/gallop auscultated. RRR.   Musculoskeletal: Gait normal. Symmetric and independent movement of all extremities  Neurological: Normal balance/coordination. No tremor.  Skin: warm, dry, intact.  Bruising around the mouth  Psychiatric: Normal judgment/insight. Normal mood and affect. Oriented x3.   Visit summary with medication list and pertinent instructions was printed for patient to review, advised to alert [place] if any changes needed. All questions at time of visit were answered - patient  instructed to contact office with any additional concerns. ER/RTC precautions were reviewed with the patient and understanding verbalized.   Follow-up plan: Return for recheck BP w/ [name] .  Note: Total time spent 25 minutes, greater than 50% of the visit was spent face-to-face counseling and coordinating care for the following: The encounter diagnosis was Essential hypertension, benign..  Please note: voice recognition software was used to produce this document, and typos may escape review. Please contact Dr. Sheppard Coil for any needed clarifications.

## 2018-02-16 ENCOUNTER — Ambulatory Visit (INDEPENDENT_AMBULATORY_CARE_PROVIDER_SITE_OTHER): Payer: BLUE CROSS/BLUE SHIELD | Admitting: Physician Assistant

## 2018-02-16 ENCOUNTER — Other Ambulatory Visit: Payer: Self-pay | Admitting: Physician Assistant

## 2018-02-16 ENCOUNTER — Encounter: Payer: Self-pay | Admitting: Physician Assistant

## 2018-02-16 VITALS — BP 161/68 | HR 61 | Ht 63.0 in | Wt 170.0 lb

## 2018-02-16 DIAGNOSIS — F329 Major depressive disorder, single episode, unspecified: Secondary | ICD-10-CM

## 2018-02-16 DIAGNOSIS — I10 Essential (primary) hypertension: Secondary | ICD-10-CM

## 2018-02-16 DIAGNOSIS — F419 Anxiety disorder, unspecified: Principal | ICD-10-CM

## 2018-02-16 DIAGNOSIS — F32A Depression, unspecified: Secondary | ICD-10-CM

## 2018-02-16 MED ORDER — LISINOPRIL 40 MG PO TABS: 40.0000 mg | ORAL_TABLET | Freq: Every day | ORAL | 0 refills | Status: DC

## 2018-02-16 NOTE — Progress Notes (Signed)
   Subjective:    Patient ID: Angelica [name], female    DOB: 1950/10/02, 67 y.o.   MRN: [id number]  HPI  Pt is a 67 yo female with HTN, GAD who presents to the clinic to follow up on uncontrolled blood pressure. On 02/04/18 pt had 7 teeth removed and after her BP was very elevated and EMS witnessed collapse and took to ED. No changes were made to medications. She was seen here in clinic 2 days later. Her BP improved but she was still in pain. Pt was given small quanity of norco.Today she feels fine. She denies any pain, CP, palpitations, headaches, SOB. She continues to feel a little uneasy but denies any panic. She is scheduled to go back next Wednesday to remove more teeth. They are going to put her to sleep this time. She is taking lisinopril 20mg  daily.   .. Active Ambulatory Problems    Diagnosis Date Noted  . Breast cancer ([place]) 01/31/2011  . GAD (generalized anxiety disorder) 01/31/2011  . Asthma 01/31/2011  . Seasonal allergies 01/31/2011  . Hypothyroid 01/31/2011  . Episodic mood disorder ([place]) 10/17/2012  . Anxiety and depression 08/03/2013  . Chondromalacia of right patellofemoral joint 02/10/2014  . Insomnia 03/15/2014  . IBS (irritable bowel syndrome) 08/22/2014  . OAB (overactive bladder) 03/08/2015  . Vitamin D deficiency 03/09/2015  . Dyslipidemia (high LDL; low HDL) 03/09/2015  . Elevated blood pressure 04/26/2015  . Trapezius muscle spasm 09/28/2015  . Epistaxis 11/14/2015  . Acute upper respiratory infection 12/15/2015  . Essential hypertension, benign 06/11/2016  . Cellulitis and abscess of buttock 06/04/2017  . Calculus of gallbladder 10/21/2017  . Elevated lipase 10/21/2017  . Diarrhea 10/21/2017  . Upper abdominal pain 10/21/2017   Resolved Ambulatory Problems    Diagnosis Date Noted  . Impingement syndrome of left shoulder 08/28/2015   Past Medical History:  Diagnosis Date  . Allergy   . Anxiety   . Asthma   . Depression   . Dyspnea   . Hypertension    . Hypothyroidism   . Postmenopausal   . Thyroid disease      Review of Systems    see HPI.  Objective:   Physical Exam  Constitutional: She appears well-developed and well-nourished.  HENT:  Head: Normocephalic and atraumatic.  Cardiovascular: Normal rate and regular rhythm.  Pulmonary/Chest: Effort normal and breath sounds normal.  Psychiatric: She has a normal mood and affect. Her behavior is normal.          Assessment & Plan:  Marland KitchenMarland KitchenDiagnoses and all orders for this visit:  Essential hypertension -     lisinopril (PRINIVIL,ZESTRIL) 40 MG tablet; Take 1 tablet (40 mg total) by mouth daily.   It sounds probable that pain and panic created elevated BP. At this time BP still elevated. Will increase lisinopril to 40mg  daily. Follow up in 2-3 weeks.

## 2018-02-24 ENCOUNTER — Other Ambulatory Visit: Payer: Self-pay

## 2018-02-24 DIAGNOSIS — F411 Generalized anxiety disorder: Secondary | ICD-10-CM

## 2018-02-24 MED ORDER — BUSPIRONE HCL 10 MG PO TABS: 10.0000 mg | ORAL_TABLET | Freq: Three times a day (TID) | ORAL | 1 refills | Status: DC

## 2018-03-02 ENCOUNTER — Telehealth: Payer: Self-pay | Admitting: Physician Assistant

## 2018-03-02 ENCOUNTER — Encounter: Payer: Self-pay | Admitting: Physician Assistant

## 2018-03-02 ENCOUNTER — Ambulatory Visit (INDEPENDENT_AMBULATORY_CARE_PROVIDER_SITE_OTHER): Payer: BLUE CROSS/BLUE SHIELD | Admitting: Physician Assistant

## 2018-03-02 VITALS — BP 137/57 | HR 65 | Ht 63.0 in | Wt 167.0 lb

## 2018-03-02 DIAGNOSIS — M26629 Arthralgia of temporomandibular joint, unspecified side: Secondary | ICD-10-CM

## 2018-03-02 DIAGNOSIS — H9202 Otalgia, left ear: Secondary | ICD-10-CM

## 2018-03-02 DIAGNOSIS — F32A Depression, unspecified: Secondary | ICD-10-CM

## 2018-03-02 DIAGNOSIS — F329 Major depressive disorder, single episode, unspecified: Secondary | ICD-10-CM | POA: Diagnosis not present

## 2018-03-02 DIAGNOSIS — F419 Anxiety disorder, unspecified: Secondary | ICD-10-CM

## 2018-03-02 DIAGNOSIS — I10 Essential (primary) hypertension: Secondary | ICD-10-CM

## 2018-03-02 DIAGNOSIS — R040 Epistaxis: Secondary | ICD-10-CM

## 2018-03-02 MED ORDER — LISINOPRIL 40 MG PO TABS: 40.0000 mg | ORAL_TABLET | Freq: Every day | ORAL | 1 refills | Status: DC

## 2018-03-02 MED ORDER — IBUPROFEN 800 MG PO TABS: 800.0000 mg | ORAL_TABLET | Freq: Three times a day (TID) | ORAL | 0 refills | Status: DC | PRN

## 2018-03-02 MED ORDER — TEMAZEPAM 30 MG PO CAPS: 30.0000 mg | ORAL_CAPSULE | Freq: Every evening | ORAL | 1 refills | Status: DC | PRN

## 2018-03-02 NOTE — Patient Instructions (Signed)
Temporomandibular Joint Syndrome Temporomandibular joint (TMJ) syndrome is a condition that affects the joints between your jaw and your skull. The TMJs are located near your ears and allow your jaw to open and close. These joints and the nearby muscles are involved in all movements of the jaw. People with TMJ syndrome have pain in the area of these joints and muscles. Chewing, biting, or other movements of the jaw can be difficult or painful. TMJ syndrome can be caused by various things. In many cases, the condition is mild and goes away within a few weeks. For some people, the condition can become a long-term problem. What are the causes? Possible causes of TMJ syndrome include:  Grinding your teeth or clenching your jaw. Some people do this when they are under stress.  Arthritis.  Injury to the jaw.  Head or neck injury.  Teeth or dentures that are not aligned well.  In some cases, the cause of TMJ syndrome may not be known. What are the signs or symptoms? The most common symptom is an aching pain on the side of the head in the area of the TMJ. Other symptoms may include:  Pain when moving your jaw, such as when chewing or biting.  Being unable to open your jaw all the way.  Making a clicking sound when you open your mouth.  Headache.  Earache.  Neck or shoulder pain.  How is this diagnosed? Diagnosis can usually be made based on your symptoms, your medical history, and a physical exam. Your health care provider may check the range of motion of your jaw. Imaging tests, such as X-rays or an MRI, are sometimes done. You may need to see your dentist to determine if your teeth and jaw are lined up correctly. How is this treated? TMJ syndrome often goes away on its own. If treatment is needed, the options may include:  Eating soft foods and applying ice or heat.  Medicines to relieve pain or inflammation.  Medicines to relax the muscles.  A splint, bite plate, or mouthpiece  to prevent teeth grinding or jaw clenching.  Relaxation techniques or counseling to help reduce stress.  Transcutaneous electrical nerve stimulation (TENS). This helps to relieve pain by applying an electrical current through the skin.  Acupuncture. This is sometimes helpful to relieve pain.  Jaw surgery. This is rarely needed.  Follow these instructions at home:  Take medicines only as directed by your health care provider.  Eat a soft diet if you are having trouble chewing.  Apply ice to the painful area. ? Put ice in a plastic bag. ? Place a towel between your skin and the bag. ? Leave the ice on for 20 minutes, 2-3 times a day.  Apply a warm compress to the painful area as directed.  Massage your jaw area and perform any jaw stretching exercises as recommended by your health care provider.  If you were given a mouthpiece or bite plate, wear it as directed.  Avoid foods that require a lot of chewing. Do not chew gum.  Keep all follow-up visits as directed by your health care provider. This is important. Contact a health care provider if:  You are having trouble eating.  You have new or worsening symptoms. Get help right away if:  Your jaw locks open or closed. This information is not intended to replace advice given to you by your health care provider. Make sure you discuss any questions you have with your health care provider. Document   Released: 03/05/2001 Document Revised: 02/08/2016 Document Reviewed: 01/13/2014 Elsevier Interactive Patient Education  2018 Elsevier Inc.  

## 2018-03-02 NOTE — Progress Notes (Signed)
Subjective:    Patient ID: Angelica [name], female    DOB: [birth year], 67 y.o.   MRN: [id number]  HPI Pt is a 67 yo female who presents to the clinic to follow up on HTN. At last visit BP was persistently elevated and I increased lisinopril. She denies any CP, palpitations, headaches, vision changes, dizziness. She had another procedure where teeth were pulled. She did much better with no BP spike.   She does need restoril for sleep refilled. She is only using klonapin as needed not every day. Overall her anxiety and depression is good but she has times where she feels "very fidgety and overwhelmed".   She does mention a few more nosebleeds. She had caterization by ENT and worked well for a while. Started to come back.   She is also having some off and on left ear pain and jaw pain since dental procedures.   .. Active Ambulatory Problems    Diagnosis Date Noted  . Breast cancer ([place]) 01/31/2011  . GAD (generalized anxiety disorder) 01/31/2011  . Asthma 01/31/2011  . Seasonal allergies 01/31/2011  . Hypothyroid 01/31/2011  . Episodic mood disorder ([place]) 10/17/2012  . Anxiety and depression 08/03/2013  . Chondromalacia of right patellofemoral joint 02/10/2014  . Insomnia 03/15/2014  . IBS (irritable bowel syndrome) 08/22/2014  . OAB (overactive bladder) 03/08/2015  . Vitamin D deficiency 03/09/2015  . Dyslipidemia (high LDL; low HDL) 03/09/2015  . Elevated blood pressure 04/26/2015  . Trapezius muscle spasm 09/28/2015  . Epistaxis 11/14/2015  . Acute upper respiratory infection 12/15/2015  . Essential hypertension, benign 06/11/2016  . Cellulitis and abscess of buttock 06/04/2017  . Calculus of gallbladder 10/21/2017  . Elevated lipase 10/21/2017  . Diarrhea 10/21/2017  . Upper abdominal pain 10/21/2017   Resolved Ambulatory Problems    Diagnosis Date Noted  . Impingement syndrome of left shoulder 08/28/2015   Past Medical History:  Diagnosis Date  . Allergy   . Anxiety    . Asthma   . Depression   . Dyspnea   . Hypertension   . Hypothyroidism   . Postmenopausal   . Thyroid disease    Review of Systems See HPI.     Objective:   Physical Exam  Constitutional: She is oriented to person, place, and time. She appears well-developed and well-nourished.  HENT:  Head: Normocephalic and atraumatic.  Right Ear: External ear normal.  Left Ear: External ear normal.  Mouth/Throat: Oropharynx is clear and moist.  Some tenderness over left TMJ.  Bilateral TM's look great.   Cardiovascular: Normal rate and regular rhythm.  Pulmonary/Chest: Effort normal and breath sounds normal.  Neurological: She is alert and oriented to person, place, and time.  Psychiatric: She has a normal mood and affect. Her behavior is normal.          Assessment & Plan:  Marland KitchenMarland KitchenDiagnoses and all orders for this visit:  Essential hypertension -     lisinopril (PRINIVIL,ZESTRIL) 40 MG tablet; Take 1 tablet (40 mg total) by mouth daily.  Anxiety and depression -     temazepam (RESTORIL) 30 MG capsule; Take 1 capsule (30 mg total) by mouth at bedtime as needed for sleep.  Left ear pain -     ibuprofen (ADVIL,MOTRIN) 800 MG tablet; Take 1 tablet (800 mg total) by mouth every 8 (eight) hours as needed.  TMJ pain dysfunction syndrome -     ibuprofen (ADVIL,MOTRIN) 800 MG tablet; Take 1 tablet (800 mg total) by mouth every  8 (eight) hours as needed.   .. Depression screen [facility] 2/9 03/02/2018 10/20/2017 11/29/2016  Decreased Interest 0 1 0  Down, Depressed, Hopeless 0 1 0  PHQ - 2 Score 0 2 0  Altered sleeping 3 2 3   Tired, decreased energy 3 2 0  Change in appetite 3 2 3   Feeling bad or failure about yourself  0 1 3  Trouble concentrating 2 1 3   Moving slowly or fidgety/restless 3 1 3   Suicidal thoughts 0 0 0  PHQ-9 Score 14 11 15   Difficult doing work/chores Somewhat difficult Somewhat difficult -   .. GAD 7 : Generalized Anxiety Score 03/02/2018 10/20/2017 11/29/2016  Nervous,  Anxious, on Edge 3 2 3   Control/stop worrying 2 1 3   Worry too much - different things 2 1 3   Trouble relaxing 3 2 3   Restless 3 2 3   Easily annoyed or irritable 3 2 3   Afraid - awful might happen 3 2 2   Total GAD 7 Score 19 12 20   Anxiety Difficulty Very difficult Somewhat difficult -    BP looks great today. Continue on same dose. Follow up in 6 months.   Anxiety and depression are still problematic. She is on lexapro and buspar with klonapin as needed. Discussed dependence of klonapin if use to much and to NOT take with restoril. Encouraged her to try CBD oil. Discussed other cognitive techniques to decrease anxiety. Follow up as needed.   Follow up with ENT for future caterization for nosebleeds if needed. Keep nares moist and afrin for acute nosebleeds.   Left ear looks great. I suspect pain is from recent dental procedures and perhaps coming from TMJ. Will treat with ibuprofen for a few days, warm compresses and jaw rest. Follow up as needed.

## 2018-03-02 NOTE — Telephone Encounter (Signed)
Ask patient about last colonoscopy and if she needs referral.

## 2018-03-05 NOTE — Telephone Encounter (Signed)
Called and spoke with Ohlman. She said she has an upcoming appointment with a specialist on 03/17/2018, and she will have her colonoscopy scheduled then.

## 2018-03-05 NOTE — Telephone Encounter (Signed)
Thanks

## 2018-03-17 ENCOUNTER — Encounter: Payer: Self-pay | Admitting: Physician Assistant

## 2018-03-17 DIAGNOSIS — Z800 Family history of malignant neoplasm of digestive organs: Secondary | ICD-10-CM | POA: Diagnosis not present

## 2018-03-17 DIAGNOSIS — Z1211 Encounter for screening for malignant neoplasm of colon: Secondary | ICD-10-CM | POA: Diagnosis not present

## 2018-03-17 DIAGNOSIS — Z86010 Personal history of colonic polyps: Secondary | ICD-10-CM | POA: Diagnosis not present

## 2018-03-17 DIAGNOSIS — K5730 Diverticulosis of large intestine without perforation or abscess without bleeding: Secondary | ICD-10-CM | POA: Diagnosis not present

## 2018-03-25 ENCOUNTER — Encounter: Payer: Self-pay | Admitting: Physician Assistant

## 2018-03-30 DIAGNOSIS — D120 Benign neoplasm of cecum: Secondary | ICD-10-CM | POA: Diagnosis not present

## 2018-03-30 DIAGNOSIS — K6389 Other specified diseases of intestine: Secondary | ICD-10-CM | POA: Diagnosis not present

## 2018-03-30 DIAGNOSIS — Z1211 Encounter for screening for malignant neoplasm of colon: Secondary | ICD-10-CM | POA: Diagnosis not present

## 2018-03-30 DIAGNOSIS — Z800 Family history of malignant neoplasm of digestive organs: Secondary | ICD-10-CM | POA: Diagnosis not present

## 2018-03-30 DIAGNOSIS — D127 Benign neoplasm of rectosigmoid junction: Secondary | ICD-10-CM | POA: Diagnosis not present

## 2018-03-30 DIAGNOSIS — K635 Polyp of colon: Secondary | ICD-10-CM | POA: Diagnosis not present

## 2018-03-30 DIAGNOSIS — D122 Benign neoplasm of ascending colon: Secondary | ICD-10-CM | POA: Diagnosis not present

## 2018-03-31 DIAGNOSIS — K635 Polyp of colon: Secondary | ICD-10-CM | POA: Insufficient documentation

## 2018-04-02 ENCOUNTER — Encounter: Payer: Self-pay | Admitting: Physician Assistant

## 2018-04-02 DIAGNOSIS — Z23 Encounter for immunization: Secondary | ICD-10-CM | POA: Diagnosis not present

## 2018-04-26 ENCOUNTER — Encounter: Payer: Self-pay | Admitting: Physician Assistant

## 2018-04-26 DIAGNOSIS — K580 Irritable bowel syndrome with diarrhea: Secondary | ICD-10-CM

## 2018-04-27 MED ORDER — DICYCLOMINE HCL 10 MG PO CAPS
10.0000 mg | ORAL_CAPSULE | Freq: Two times a day (BID) | ORAL | 1 refills | Status: DC
Start: 2018-04-27 — End: 2018-10-19

## 2018-05-15 ENCOUNTER — Other Ambulatory Visit: Payer: Self-pay | Admitting: Physician Assistant

## 2018-05-15 DIAGNOSIS — I10 Essential (primary) hypertension: Secondary | ICD-10-CM

## 2018-05-21 ENCOUNTER — Other Ambulatory Visit: Payer: Self-pay | Admitting: Physician Assistant

## 2018-05-21 DIAGNOSIS — F329 Major depressive disorder, single episode, unspecified: Secondary | ICD-10-CM

## 2018-05-21 DIAGNOSIS — F419 Anxiety disorder, unspecified: Principal | ICD-10-CM

## 2018-05-21 DIAGNOSIS — F32A Depression, unspecified: Secondary | ICD-10-CM

## 2018-05-23 ENCOUNTER — Emergency Department (INDEPENDENT_AMBULATORY_CARE_PROVIDER_SITE_OTHER)
Admission: EM | Admit: 2018-05-23 | Discharge: 2018-05-23 | Disposition: A | Discharge status: Home / Self Care | Payer: BLUE CROSS/BLUE SHIELD | Source: Home / Self Care

## 2018-05-23 ENCOUNTER — Other Ambulatory Visit: Payer: Self-pay

## 2018-05-23 ENCOUNTER — Encounter: Payer: Self-pay | Admitting: Emergency Medicine

## 2018-05-23 DIAGNOSIS — J069 Acute upper respiratory infection, unspecified: Secondary | ICD-10-CM

## 2018-05-23 MED ORDER — AZITHROMYCIN 250 MG PO TABS: 250.0000 mg | ORAL_TABLET | Freq: Every day | ORAL | 0 refills | Status: DC

## 2018-05-23 MED ORDER — BENZONATATE 200 MG PO CAPS: 200.0000 mg | ORAL_CAPSULE | Freq: Two times a day (BID) | ORAL | 0 refills | Status: DC | PRN

## 2018-05-23 NOTE — ED Provider Notes (Signed)
[name]    CSN: [id number] Arrival date & time: 05/23/18  1357     History   Chief Complaint Chief Complaint  Patient presents with  . URI    cold sx's    HPI Angelica [name] is a 67 y.o. female.   HPI  This patient is a previous smoker with COPD and asthma.  She states that when she gets chest infections she always needs an antibiotic.  She has 2 days right now of nasal congestion, sinus pressure, postnasal drip, fatigue, low-grade temperature, sore throat, chest congestion, coughing.  She states that her granddaughter was diagnosed with the flu last week.  She does not have sudden onset or body ache or high fever.  She also has other family members with viral illness.  No nausea, vomiting, diarrhea.  Cough is harsh sounding but dry.  No chest pain.  Past Medical History:  Diagnosis Date  . Allergy   . Anxiety   . Asthma   . Breast cancer ([place]) 2009  . Depression   . Dyspnea    with heavy pollen   . Hypertension   . Hypothyroidism   . Postmenopausal   . Thyroid disease     Patient Active Problem List   Diagnosis Date Noted  . Colon polyps 03/31/2018  . Left-sided nosebleed 03/02/2018  . TMJ pain dysfunction syndrome 03/02/2018  . Calculus of gallbladder 10/21/2017  . Elevated lipase 10/21/2017  . Diarrhea 10/21/2017  . Upper abdominal pain 10/21/2017  . Cellulitis and abscess of buttock 06/04/2017  . Essential hypertension, benign 06/11/2016  . Acute upper respiratory infection 12/15/2015  . Epistaxis 11/14/2015  . Trapezius muscle spasm 09/28/2015  . Elevated blood pressure 04/26/2015  . Vitamin D deficiency 03/09/2015  . Dyslipidemia (high LDL; low HDL) 03/09/2015  . OAB (overactive bladder) 03/08/2015  . IBS (irritable bowel syndrome) 08/22/2014  . Insomnia 03/15/2014  . Chondromalacia of right patellofemoral joint 02/10/2014  . Anxiety and depression 08/03/2013  . Episodic mood disorder ([place]) 10/17/2012  . Breast cancer ([place])  01/31/2011  . GAD (generalized anxiety disorder) 01/31/2011  . Asthma 01/31/2011  . Seasonal allergies 01/31/2011  . Hypothyroid 01/31/2011    Past Surgical History:  Procedure Laterality Date  . ADENOIDECTOMY    . APPENDECTOMY    . BREAST RECONSTRUCTION  06/2008  . CHOLECYSTECTOMY N/A 11/12/2017   Procedure: LAPAROSCOPIC CHOLECYSTECTOMY WITH INTRAOPERATIVE CHOLANGIOGRAM ERAS PATHWAY;  Surgeon: Erroll Luna, MD;  Location: [facility];  Service: General;  Laterality: N/A;  . MASTECTOMY  2009   [facility]  . TONSILLECTOMY      OB History   None      Home Medications    Prior to Admission medications   Medication Sig Start Date End Date Taking? Authorizing Provider  albuterol (PROAIR HFA) 108 (90 Base) MCG/ACT inhaler Inhale 2-4 puffs into the lungs every 6 (six) hours as needed for wheezing or shortness of breath. 01/19/18   Breeback, [name] L, PA-C  atorvastatin (LIPITOR) 40 MG tablet Take 1 tablet (40 mg total) by mouth daily. 10/22/17   Breeback, [name] L, PA-C  azithromycin (ZITHROMAX) 250 MG tablet Take 1 tablet (250 mg total) by mouth daily. Take first 2 tablets together, then 1 every day until finished. 05/23/18   Raylene Everts, MD  benzonatate (TESSALON) 200 MG capsule Take 1 capsule (200 mg total) by mouth 2 (two) times daily as needed for cough. 05/23/18   Raylene Everts, MD  busPIRone (BUSPAR) 10 MG tablet  Take 1 tablet (10 mg total) by mouth 3 (three) times daily. 02/24/18   Breeback, [name] L, PA-C  clonazePAM (KLONOPIN) 0.5 MG tablet TAKE 1 TABLET BY MOUTH TWICE DAILY AS NEEDED FOR ANXIETY 11/05/17   Breeback, [name] L, PA-C  dicyclomine (BENTYL) 10 MG capsule Take 1 capsule (10 mg total) by mouth 2 (two) times daily. 04/27/18   Breeback, [name] L, PA-C  escitalopram (LEXAPRO) 20 MG tablet TAKE 1 TABLET(20 MG) BY MOUTH DAILY 02/16/18   Breeback, [name] L, PA-C  ibuprofen (ADVIL,MOTRIN) 800 MG tablet Take 1 tablet (800 mg total) by mouth every 8 (eight) hours as needed. 03/02/18   Breeback,  Royetta Car, PA-C  levothyroxine (SYNTHROID, LEVOTHROID) 100 MCG tablet Take 1 tablet (100 mcg total) by mouth daily. 10/22/17   Breeback, [name] L, PA-C  lisinopril (PRINIVIL,ZESTRIL) 40 MG tablet Take 1 tablet (40 mg total) by mouth daily. 03/02/18   Breeback, [name] L, PA-C  temazepam (RESTORIL) 30 MG capsule Take 1 capsule (30 mg total) by mouth at bedtime as needed for sleep. 03/02/18   Breeback, [name] L, PA-C  oxybutynin (DITROPAN) 5 MG tablet Take 1 tablet (5 mg total) by mouth 2 (two) times daily. 12/30/11 05/07/12  Hali Marry, MD    Family History Family History  Problem Relation Age of Onset  . Stroke Mother   . Cancer Mother        colon?  [name] Hypertension Father   . Hyperlipidemia Father     Social History Social History   Tobacco Use  . Smoking status: Former Smoker    Last attempt to quit: 06/25/2011    Years since quitting: 6.9  . Smokeless tobacco: Never Used  Substance Use Topics  . Alcohol use: No  . Drug use: No     Allergies   Penicillins; Red dye; Sulfa antibiotics; Amitriptyline; Macrodantin; and Nitrofurantoin   Review of Systems Review of Systems  Constitutional: Positive for fatigue. Negative for chills and fever.  HENT: Positive for congestion, nosebleeds, postnasal drip, rhinorrhea, sinus pressure and sore throat. Negative for ear pain.   Eyes: Negative for pain and visual disturbance.  Respiratory: Positive for cough and chest tightness. Negative for shortness of breath.   Cardiovascular: Negative for chest pain and palpitations.  Gastrointestinal: Negative for abdominal pain, nausea and vomiting.  Genitourinary: Negative for dysuria and hematuria.  Musculoskeletal: Negative for arthralgias and back pain.  Skin: Negative for color change and rash.  Neurological: Negative for seizures and syncope.  All other systems reviewed and are negative.    Physical Exam Triage Vital Signs ED Triage Vitals  Enc Vitals Group     BP 05/23/18 1410 111/68      Pulse Rate 05/23/18 1410 74     Resp --      Temp 05/23/18 1410 98 F (36.7 C)     Temp Source 05/23/18 1410 Oral     SpO2 05/23/18 1410 96 %     Weight 05/23/18 1411 154 lb 6.4 oz (70 kg)     Height 05/23/18 1411 _0  (1.6 m)     Head Circumference --      Peak Flow --      Pain Score 05/23/18 1435 0     Pain Loc --      Pain Edu? --      Excl. in [place]? --    No data found.  Updated Vital Signs BP 111/68 (BP Location: Left Arm)   Pulse 74   Temp  67 F (36.7 C) (Oral)   Ht _0  (1.6 m)   Wt 70 kg   LMP 06/24/2000   SpO2 96%   BMI 27.35 kg/m       Physical Exam  Constitutional: She appears well-developed and well-nourished. No distress.  Appears tired.  Hoarse voice.  HENT:  Head: Normocephalic and atraumatic.  Right Ear: External ear normal.  Left Ear: External ear normal.  Mouth/Throat: Oropharynx is clear and moist.  Nasal membranes congested and red.  Clear rhinorrhea  Eyes: Pupils are equal, round, and reactive to light. Conjunctivae are normal.  Neck: Normal range of motion.  Cardiovascular: Normal rate, regular rhythm and normal heart sounds.  Pulmonary/Chest: Effort normal and breath sounds normal. No respiratory distress.  No rales, no wheeze  Abdominal: Soft. She exhibits no distension.  Musculoskeletal: Normal range of motion. She exhibits no edema.  Lymphadenopathy:    She has no cervical adenopathy.  Neurological: She is alert.  Skin: Skin is warm and dry.  Psychiatric: She has a normal mood and affect. Her behavior is normal.  Vitals reviewed.    UC Treatments / Results  Labs (all labs ordered are listed, but only abnormal results are displayed) Labs Reviewed - No data to display  EKG None  Radiology No results found.  Procedures Procedures (including critical care time)  Medications Ordered in UC Medications - No data to display  Initial Impression / Assessment and Plan / UC Course  I have reviewed the triage vital signs and the  nursing notes.  Pertinent labs & imaging results that were available during my care of the patient were reviewed by me and considered in my medical decision making (see chart for details).    I explained to the patient that a high percentage of these infections are caused by viruses.  I do not believe an antibiotic is going to help her.  She has a firm belief that she will need an antibiotic to get over this infection.  I was able to discuss this with her, and recommends that I prescribe an antibiotic for her, and paper, and she will get this filled only if fails to improve over the next couple days, or has fever she agrees.  We did discuss symptomatic care. Final Clinical Impressions(s) / UC Diagnoses   Final diagnoses:  Viral upper respiratory tract infection     Discharge Instructions     Continue to drink plenty of fluids Try flonase or nasonex for the sinus congestion Use tessalon as needed for cough Take the antibiotic if you fail to improve in a few days   ED Prescriptions    Medication Sig Dispense Auth. Provider   benzonatate (TESSALON) 200 MG capsule Take 1 capsule (200 mg total) by mouth 2 (two) times daily as needed for cough. 20 capsule Raylene Everts, MD   azithromycin (ZITHROMAX) 250 MG tablet Take 1 tablet (250 mg total) by mouth daily. Take first 2 tablets together, then 1 every day until finished. 6 tablet Raylene Everts, MD     Controlled Substance Prescriptions [place] Controlled Substance Registry consulted? Not Applicable   Raylene Everts, MD 05/23/18 [phone]

## 2018-05-23 NOTE — Discharge Instructions (Signed)
Continue to drink plenty of fluids Try flonase or nasonex for the sinus congestion Use tessalon as needed for cough Take the antibiotic if you fail to improve in a few days

## 2018-05-23 NOTE — ED Triage Notes (Signed)
Here with non productive cough, nasal congestion and sore throat x2 days. States, everyone in her house is sick. [place] daughter diagnosed with flu last week. Tried otc Tylenol. Denies fever, chills,n,v

## 2018-05-24 ENCOUNTER — Encounter: Payer: Self-pay | Admitting: Physician Assistant

## 2018-05-24 DIAGNOSIS — F32A Depression, unspecified: Secondary | ICD-10-CM

## 2018-05-24 DIAGNOSIS — F419 Anxiety disorder, unspecified: Principal | ICD-10-CM

## 2018-05-24 DIAGNOSIS — F329 Major depressive disorder, single episode, unspecified: Secondary | ICD-10-CM

## 2018-05-25 ENCOUNTER — Ambulatory Visit (INDEPENDENT_AMBULATORY_CARE_PROVIDER_SITE_OTHER): Payer: BLUE CROSS/BLUE SHIELD | Admitting: Physician Assistant

## 2018-05-25 ENCOUNTER — Encounter: Payer: Self-pay | Admitting: Physician Assistant

## 2018-05-25 VITALS — BP 142/65 | HR 72 | Temp 98.5°F | Ht 63.0 in | Wt 162.0 lb

## 2018-05-25 DIAGNOSIS — R059 Cough, unspecified: Secondary | ICD-10-CM

## 2018-05-25 DIAGNOSIS — J0140 Acute pansinusitis, unspecified: Secondary | ICD-10-CM

## 2018-05-25 DIAGNOSIS — R05 Cough: Secondary | ICD-10-CM | POA: Diagnosis not present

## 2018-05-25 MED ORDER — PREDNISONE 50 MG PO TABS: ORAL_TABLET | ORAL | 0 refills | Status: DC

## 2018-05-25 MED ORDER — ESCITALOPRAM OXALATE 20 MG PO TABS: 20.0000 mg | ORAL_TABLET | Freq: Every day | ORAL | 0 refills | Status: DC

## 2018-05-25 MED ORDER — LEVOFLOXACIN 500 MG PO TABS: 500.0000 mg | ORAL_TABLET | Freq: Every day | ORAL | 0 refills | Status: DC

## 2018-05-25 NOTE — Patient Instructions (Signed)

## 2018-05-25 NOTE — Progress Notes (Signed)
Subjective:    Patient ID: Angelica [name], female    DOB: 11-23-1950, 67 y.o.   MRN: [id number]  HPI Patient is a 67 year old female with a history of asthma, hypertension and hypothyroidism presenting today with complaints of chest congestion and nausea. She reports that symptoms started 5 days ago and went to urgent care on Saturday. She was prescribed azithromycin and Tessalon Perles and she reports no relief. She is currently still experiencing a dry cough that keeps her up at night, maxillary and frontal sinus tenderness, muffled hearing, intermittent headaches, irritated throat, abdominal discomfort and chest tenderness. She denies fever, myalgias, SOB and rhinorrhea.  She has tried [facility] but discontinued after experiencing epistaxis. She has also been using her inhaler 3x a day with no relief of symptoms. Her grandchildren are both sick with a viral illness and influenza.  .. Active Ambulatory Problems    Diagnosis Date Noted  . Breast cancer ([place]) 01/31/2011  . GAD (generalized anxiety disorder) 01/31/2011  . Asthma 01/31/2011  . Seasonal allergies 01/31/2011  . Hypothyroid 01/31/2011  . Episodic mood disorder ([place]) 10/17/2012  . Anxiety and depression 08/03/2013  . Chondromalacia of right patellofemoral joint 02/10/2014  . Insomnia 03/15/2014  . IBS (irritable bowel syndrome) 08/22/2014  . OAB (overactive bladder) 03/08/2015  . Vitamin D deficiency 03/09/2015  . Dyslipidemia (high LDL; low HDL) 03/09/2015  . Elevated blood pressure 04/26/2015  . Trapezius muscle spasm 09/28/2015  . Epistaxis 11/14/2015  . Acute upper respiratory infection 12/15/2015  . Essential hypertension, benign 06/11/2016  . Cellulitis and abscess of buttock 06/04/2017  . Calculus of gallbladder 10/21/2017  . Elevated lipase 10/21/2017  . Diarrhea 10/21/2017  . Upper abdominal pain 10/21/2017  . Left-sided nosebleed 03/02/2018  . TMJ pain dysfunction syndrome 03/02/2018  . Colon polyps 03/31/2018    Resolved Ambulatory Problems    Diagnosis Date Noted  . Impingement syndrome of left shoulder 08/28/2015   Past Medical History:  Diagnosis Date  . Allergy   . Anxiety   . Asthma   . Depression   . Dyspnea   . Hypertension   . Hypothyroidism   . Postmenopausal   . Thyroid disease        Review of Systems  Constitutional: Positive for fatigue. Negative for chills and fever.  HENT: Positive for congestion, nosebleeds, postnasal drip, sinus pressure, sinus pain and sore throat. Negative for rhinorrhea.        Ear fullness with muffled hearing.  Respiratory: Positive for cough and chest tightness. Negative for shortness of breath.   Gastrointestinal: Positive for nausea.  Musculoskeletal: Negative for arthralgias and myalgias.  Neurological: Positive for headaches.       Objective:   Physical Exam  Constitutional: She appears well-developed and well-nourished. No distress.  HENT:  Right Ear: Tympanic membrane is not erythematous. A middle ear effusion is present.  Left Ear: Tympanic membrane is not erythematous. A middle ear effusion is present.  Mouth/Throat: Oropharynx is clear and moist. No oropharyngeal exudate.  Cardiovascular: Normal rate and regular rhythm. Exam reveals no gallop and no friction rub.  No murmur heard. Pulmonary/Chest: No respiratory distress. She has wheezes. She exhibits tenderness.  Psychiatric: She has a normal mood and affect. Her behavior is normal.          Assessment & Plan:  Marland KitchenMarland KitchenDiagnoses and all orders for this visit:  Acute non-recurrent pansinusitis -     predniSONE (DELTASONE) 50 MG tablet; Take one tablet for 5 days. -  levofloxacin (LEVAQUIN) 500 MG tablet; Take 1 tablet (500 mg total) by mouth daily.  Cough   zpak does not seem to be helping with sinus infection. Will switch abx to levaquin due to PCN and red dye allergy we are limited with what abx we can use. Symptomatic care discussed and given HO. Prednisone added  for wheezing and coughing. Follow up as needed.

## 2018-06-01 ENCOUNTER — Ambulatory Visit: Payer: BLUE CROSS/BLUE SHIELD | Admitting: Physician Assistant

## 2018-07-24 ENCOUNTER — Encounter: Payer: Self-pay | Admitting: Physician Assistant

## 2018-07-24 DIAGNOSIS — I10 Essential (primary) hypertension: Secondary | ICD-10-CM

## 2018-07-24 MED ORDER — LISINOPRIL 40 MG PO TABS: 40.0000 mg | ORAL_TABLET | Freq: Every day | ORAL | 1 refills | Status: DC

## 2018-08-03 ENCOUNTER — Other Ambulatory Visit: Payer: Self-pay | Admitting: Physician Assistant

## 2018-08-03 DIAGNOSIS — F411 Generalized anxiety disorder: Secondary | ICD-10-CM

## 2018-08-12 ENCOUNTER — Encounter: Payer: Self-pay | Admitting: Physician Assistant

## 2018-08-12 ENCOUNTER — Other Ambulatory Visit: Payer: Self-pay | Admitting: Physician Assistant

## 2018-08-12 DIAGNOSIS — F32A Depression, unspecified: Secondary | ICD-10-CM

## 2018-08-12 DIAGNOSIS — F419 Anxiety disorder, unspecified: Principal | ICD-10-CM

## 2018-08-12 DIAGNOSIS — F329 Major depressive disorder, single episode, unspecified: Secondary | ICD-10-CM

## 2018-08-12 MED ORDER — ESCITALOPRAM OXALATE 20 MG PO TABS: 20.0000 mg | ORAL_TABLET | Freq: Every day | ORAL | 0 refills | Status: DC

## 2018-08-31 ENCOUNTER — Encounter: Payer: Self-pay | Admitting: Physician Assistant

## 2018-08-31 ENCOUNTER — Ambulatory Visit: Payer: BLUE CROSS/BLUE SHIELD | Admitting: Physician Assistant

## 2018-08-31 ENCOUNTER — Ambulatory Visit (INDEPENDENT_AMBULATORY_CARE_PROVIDER_SITE_OTHER): Payer: BLUE CROSS/BLUE SHIELD | Admitting: Physician Assistant

## 2018-08-31 VITALS — BP 149/57 | HR 61 | Ht 63.0 in | Wt 157.0 lb

## 2018-08-31 DIAGNOSIS — Z23 Encounter for immunization: Secondary | ICD-10-CM | POA: Diagnosis not present

## 2018-08-31 DIAGNOSIS — K580 Irritable bowel syndrome with diarrhea: Secondary | ICD-10-CM | POA: Diagnosis not present

## 2018-08-31 DIAGNOSIS — F32A Depression, unspecified: Secondary | ICD-10-CM

## 2018-08-31 DIAGNOSIS — F329 Major depressive disorder, single episode, unspecified: Secondary | ICD-10-CM | POA: Diagnosis not present

## 2018-08-31 DIAGNOSIS — E890 Postprocedural hypothyroidism: Secondary | ICD-10-CM | POA: Diagnosis not present

## 2018-08-31 DIAGNOSIS — R221 Localized swelling, mass and lump, neck: Secondary | ICD-10-CM | POA: Diagnosis not present

## 2018-08-31 DIAGNOSIS — Z1159 Encounter for screening for other viral diseases: Secondary | ICD-10-CM

## 2018-08-31 DIAGNOSIS — F411 Generalized anxiety disorder: Secondary | ICD-10-CM | POA: Diagnosis not present

## 2018-08-31 DIAGNOSIS — F419 Anxiety disorder, unspecified: Secondary | ICD-10-CM

## 2018-08-31 DIAGNOSIS — E785 Hyperlipidemia, unspecified: Secondary | ICD-10-CM

## 2018-08-31 MED ORDER — TEMAZEPAM 30 MG PO CAPS: 30.0000 mg | ORAL_CAPSULE | Freq: Every evening | ORAL | 1 refills | Status: DC | PRN

## 2018-08-31 MED ORDER — BUSPIRONE HCL 10 MG PO TABS: ORAL_TABLET | ORAL | 1 refills | Status: DC

## 2018-08-31 MED ORDER — ESCITALOPRAM OXALATE 20 MG PO TABS: 20.0000 mg | ORAL_TABLET | Freq: Every day | ORAL | 1 refills | Status: DC

## 2018-08-31 MED ORDER — CLONAZEPAM 0.5 MG PO TABS: 0.5000 mg | ORAL_TABLET | Freq: Two times a day (BID) | ORAL | 5 refills | Status: DC | PRN

## 2018-08-31 NOTE — Progress Notes (Signed)
Subjective:    Patient ID: Angelica [name], female    DOB: 09-15-1950, 68 y.o.   MRN: [id number]  HPI  Pt is a 68 yo female with HTN, asthma, IBS, GAD, MDD who presents to the clinic for 6 month follow up.   Patient is doing really well.  Her mood is controlled.  She has some intermittent anxiety but overall doing really well.  She denies any suicidal thoughts or homicidal idealizations.  She is excited about getting her bottom set of teeth to complete her top set.  She has lost 17 pounds so she wants to make sure she keeps this weight off.  Pt does feel like the right side of her neck is fuller than the left. She has hx of radioactive ablation about 40 years ago. She denies any fatigue, worsening anxiety, hyperactivity.   Pt denies any abdominal pains, diarrhea, SOB, or wheezing.   .. Active Ambulatory Problems    Diagnosis Date Noted  . Breast cancer ([place]) 01/31/2011  . GAD (generalized anxiety disorder) 01/31/2011  . Asthma 01/31/2011  . Seasonal allergies 01/31/2011  . Hypothyroid 01/31/2011  . Episodic mood disorder ([place]) 10/17/2012  . Anxiety and depression 08/03/2013  . Chondromalacia of right patellofemoral joint 02/10/2014  . Insomnia 03/15/2014  . IBS (irritable bowel syndrome) 08/22/2014  . OAB (overactive bladder) 03/08/2015  . Vitamin D deficiency 03/09/2015  . Dyslipidemia (high LDL; low HDL) 03/09/2015  . Elevated blood pressure 04/26/2015  . Trapezius muscle spasm 09/28/2015  . Epistaxis 11/14/2015  . Essential hypertension, benign 06/11/2016  . Cellulitis and abscess of buttock 06/04/2017  . Calculus of gallbladder 10/21/2017  . Elevated lipase 10/21/2017  . Diarrhea 10/21/2017  . Upper abdominal pain 10/21/2017  . Left-sided nosebleed 03/02/2018  . TMJ pain dysfunction syndrome 03/02/2018  . Colon polyps 03/31/2018   Resolved Ambulatory Problems    Diagnosis Date Noted  . Impingement syndrome of left shoulder 08/28/2015  . Acute upper respiratory  infection 12/15/2015   Past Medical History:  Diagnosis Date  . Allergy   . Anxiety   . Asthma   . Depression   . Dyspnea   . Hypertension   . Hypothyroidism   . Postmenopausal   . Thyroid disease       Review of Systems See HPI.     Objective:   Physical Exam Vitals signs reviewed.  Constitutional:      Appearance: Normal appearance.  HENT:     Head: Normocephalic.     Right Ear: Tympanic membrane normal.     Left Ear: Tympanic membrane normal.     Nose: Nose normal. No congestion.     Mouth/Throat:     Mouth: Mucous membranes are moist.     Pharynx: No oropharyngeal exudate.  Neck:     Musculoskeletal: Normal range of motion. No muscular tenderness.     Vascular: No carotid bruit.  Cardiovascular:     Rate and Rhythm: Normal rate.     Pulses: Normal pulses.  Pulmonary:     Effort: Pulmonary effort is normal.     Breath sounds: Normal breath sounds.  Lymphadenopathy:     Cervical: No cervical adenopathy.  Neurological:     General: No focal deficit present.     Mental Status: She is alert and oriented to person, place, and time.  Psychiatric:        Mood and Affect: Mood normal.        Behavior: Behavior normal.  Assessment & Plan:  Marland KitchenMarland KitchenRoselani was seen today for hypertension and depression.  Diagnoses and all orders for this visit:  Irritable bowel syndrome with diarrhea -     COMPLETE METABOLIC PANEL WITH GFR  Anxiety and depression -     COMPLETE METABOLIC PANEL WITH GFR -     temazepam (RESTORIL) 30 MG capsule; Take 1 capsule (30 mg total) by mouth at bedtime as needed for sleep. -     escitalopram (LEXAPRO) 20 MG tablet; Take 1 tablet (20 mg total) by mouth daily. -     clonazePAM (KLONOPIN) 0.5 MG tablet; Take 1 tablet (0.5 mg total) by mouth 2 (two) times daily as needed. for anxiety  GAD (generalized anxiety disorder) -     COMPLETE METABOLIC PANEL WITH GFR -     busPIRone (BUSPAR) 10 MG tablet; TAKE 1 TABLET(10 MG) BY MOUTH THREE  TIMES DAILY  Postablative hypothyroidism -     COMPLETE METABOLIC PANEL WITH GFR -     TSH -     US THYROID  Dyslipidemia (high LDL; low HDL) -     Lipid Panel w/reflex Direct LDL  Need for hepatitis C screening test -     Hepatitis C Antibody  Neck fullness -     US THYROID  Need for pneumococcal vaccine -     Pneumococcal polysaccharide vaccine 23-valent greater than or equal to 2yo subcutaneous/IM   .[name] Depression screen [facility] 2/9 08/31/2018 03/02/2018 10/20/2017 11/29/2016  Decreased Interest 0 0 1 0  Down, Depressed, Hopeless 0 0 1 0  PHQ - 2 Score 0 0 2 0  Altered sleeping 0 3 2 3   Tired, decreased energy 0 3 2 0  Change in appetite 0 3 2 3   Feeling bad or failure about yourself  0 0 1 3  Trouble concentrating 0 2 1 3   Moving slowly or fidgety/restless 0 3 1 3   Suicidal thoughts 0 0 0 0  PHQ-9 Score 0 14 11 15   Difficult doing work/chores Not difficult at all Somewhat difficult Somewhat difficult -   .. GAD 7 : Generalized Anxiety Score 08/31/2018 03/02/2018 10/20/2017 11/29/2016  Nervous, Anxious, on Edge 1 3 2 3   Control/stop worrying 1 2 1 3   Worry too much - different things 1 2 1 3   Trouble relaxing 1 3 2 3   Restless 1 3 2 3   Easily annoyed or irritable 1 3 2 3   Afraid - awful might happen 1 3 2 2   Total GAD 7 Score 7 19 12 20   Anxiety Difficulty Somewhat difficult Very difficult Somewhat difficult -    Refilled medication today. Pt aware NOT to use klonapin and restoril together.   Fasting labs ordered.   Pneumonia 23 given today without complications.   BP a little elevated today but to goal for age under 150/90.   I did not feel asymmetry of neck but patient feels fuller on the right side. Hx of thyroid ablation and medication. Will get u/s. Will test TSH. Follow up as needed or if symptoms persist.   Follow up in 6 months.

## 2018-09-01 ENCOUNTER — Other Ambulatory Visit: Payer: Self-pay | Admitting: Physician Assistant

## 2018-09-01 DIAGNOSIS — E890 Postprocedural hypothyroidism: Secondary | ICD-10-CM

## 2018-09-01 LAB — COMPLETE METABOLIC PANEL WITH GFR
AG RATIO: 1.8 (calc) (ref 1.0–2.5)
ALT: 11 U/L (ref 6–29)
AST: 16 U/L (ref 10–35)
Albumin: 4.2 g/dL (ref 3.6–5.1)
Alkaline phosphatase (APISO): 100 U/L (ref 37–153)
BILIRUBIN TOTAL: 1.2 mg/dL (ref 0.2–1.2)
BUN: 10 mg/dL (ref 7–25)
CO2: 29 mmol/L (ref 20–32)
Calcium: 9.2 mg/dL (ref 8.6–10.4)
Chloride: 107 mmol/L (ref 98–110)
Creat: 0.66 mg/dL (ref 0.50–0.99)
GFR, Est African American: 106 mL/min/{1.73_m2} (ref >=60)
GFR, Est Non African American: 91 mL/min/{1.73_m2} (ref >=60)
Globulin: 2.3 g/dL (calc) (ref 1.9–3.7)
Glucose, Bld: 94 mg/dL (ref 65–99)
Potassium: 3.6 mmol/L (ref 3.5–5.3)
Sodium: 144 mmol/L (ref 135–146)
Total Protein: 6.5 g/dL (ref 6.1–8.1)

## 2018-09-01 LAB — TSH: TSH: 0.07 mIU/L — ABNORMAL LOW (ref 0.40–4.50)

## 2018-09-01 LAB — LIPID PANEL W/REFLEX DIRECT LDL
Cholesterol: 162 mg/dL (ref <200)
HDL: 36 mg/dL — ABNORMAL LOW (ref >=50)
LDL Cholesterol (Calc): 103 mg/dL (calc) — ABNORMAL HIGH
Non-HDL Cholesterol (Calc): 126 mg/dL (calc) (ref <130)
Total CHOL/HDL Ratio: 4.5 (calc) (ref <5.0)
Triglycerides: 125 mg/dL (ref <150)

## 2018-09-01 LAB — HEPATITIS C ANTIBODY
HEP C AB: NONREACTIVE
SIGNAL TO CUT-OFF: 0.01 (ref <1.00)

## 2018-09-01 IMAGING — RF DG CHOLANGIOGRAM OPERATIVE
1 series · 5 of 5 positions shown · non-contrast
Comparison: None.

CLINICAL DATA: Cholecystectomy for symptomatic cholelithiasis.

EXAM:
INTRAOPERATIVE CHOLANGIOGRAM
TECHNIQUE: Cholangiographic images from the C-arm fluoroscopic device were
submitted for interpretation post-operatively. Please see the
procedural report for the amount of contrast and the fluoroscopy
time utilized.

[Series 1: run · 2 acquisitions, 5 frames shown]
[im 1/2]
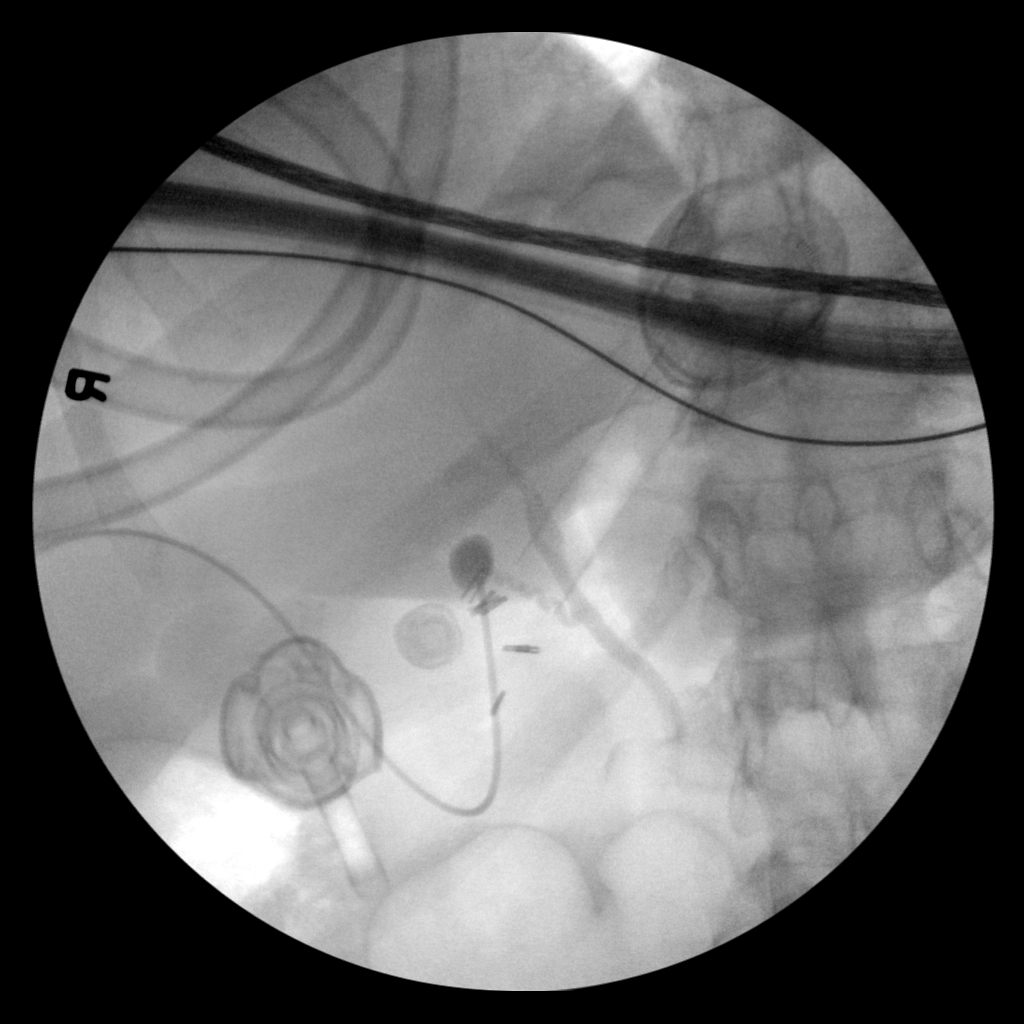
[im 1/2]
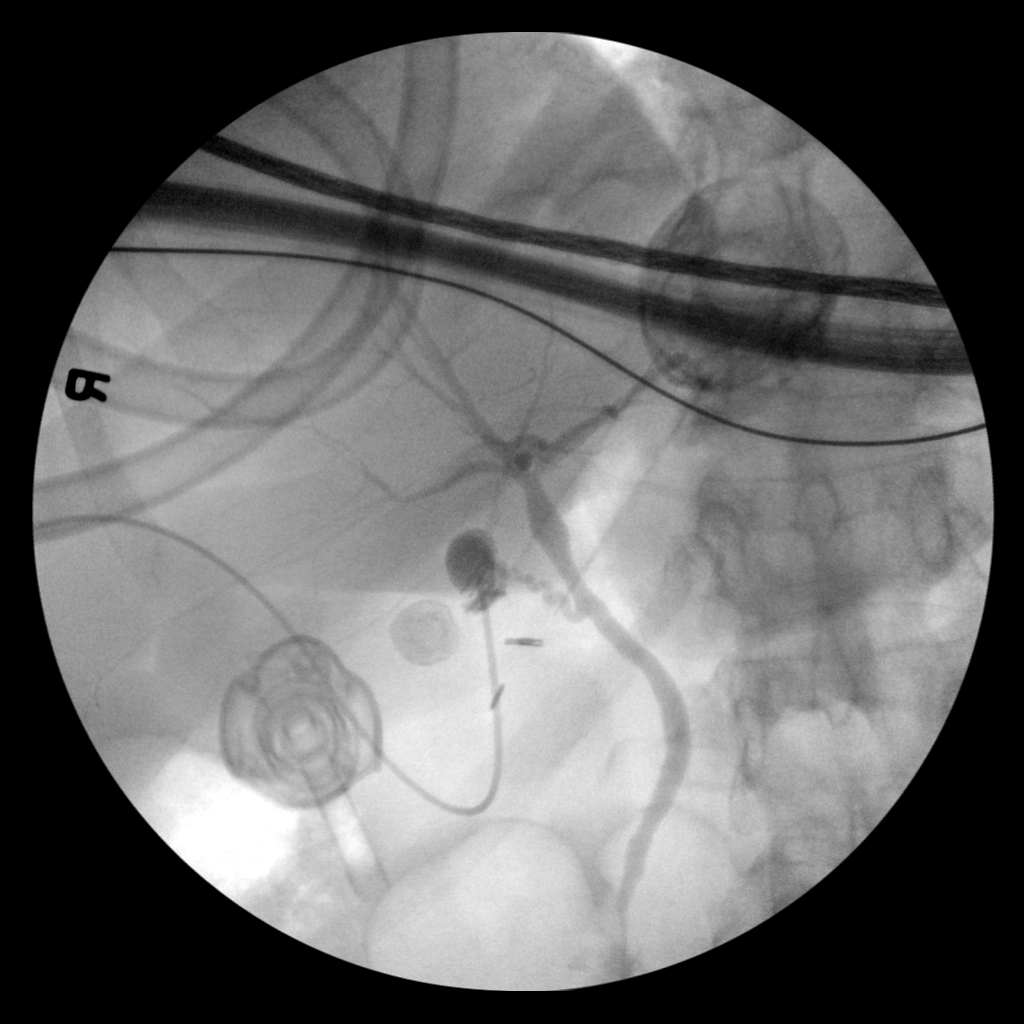
[im 1/2]
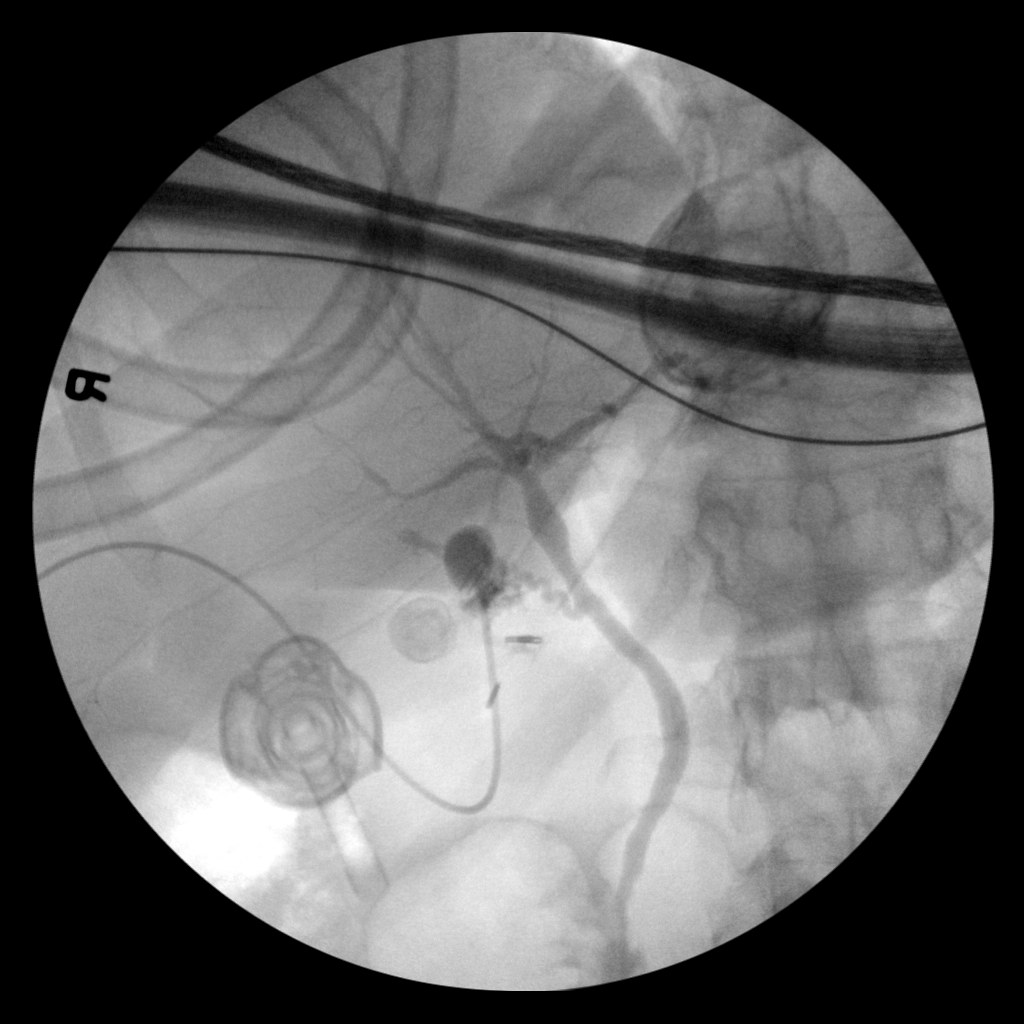
[im 1/2]
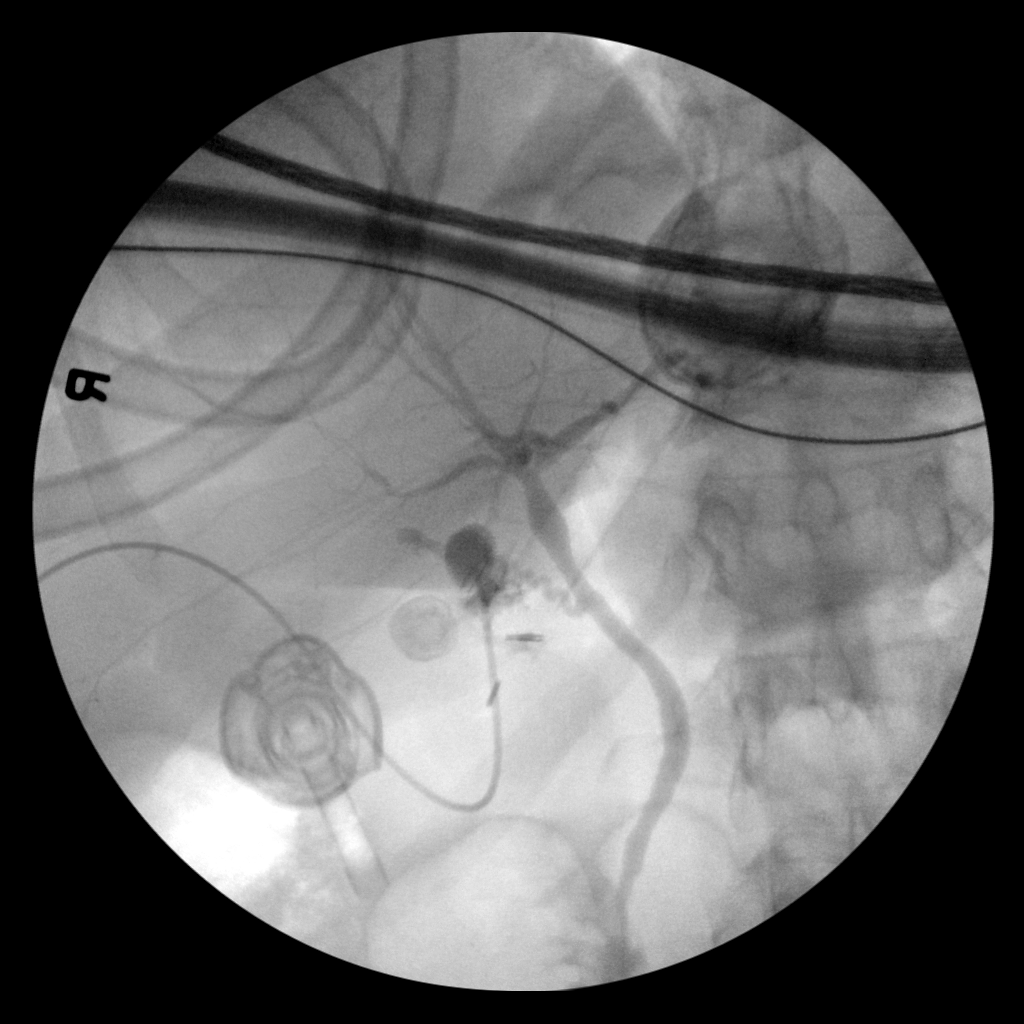
[im 2/2]
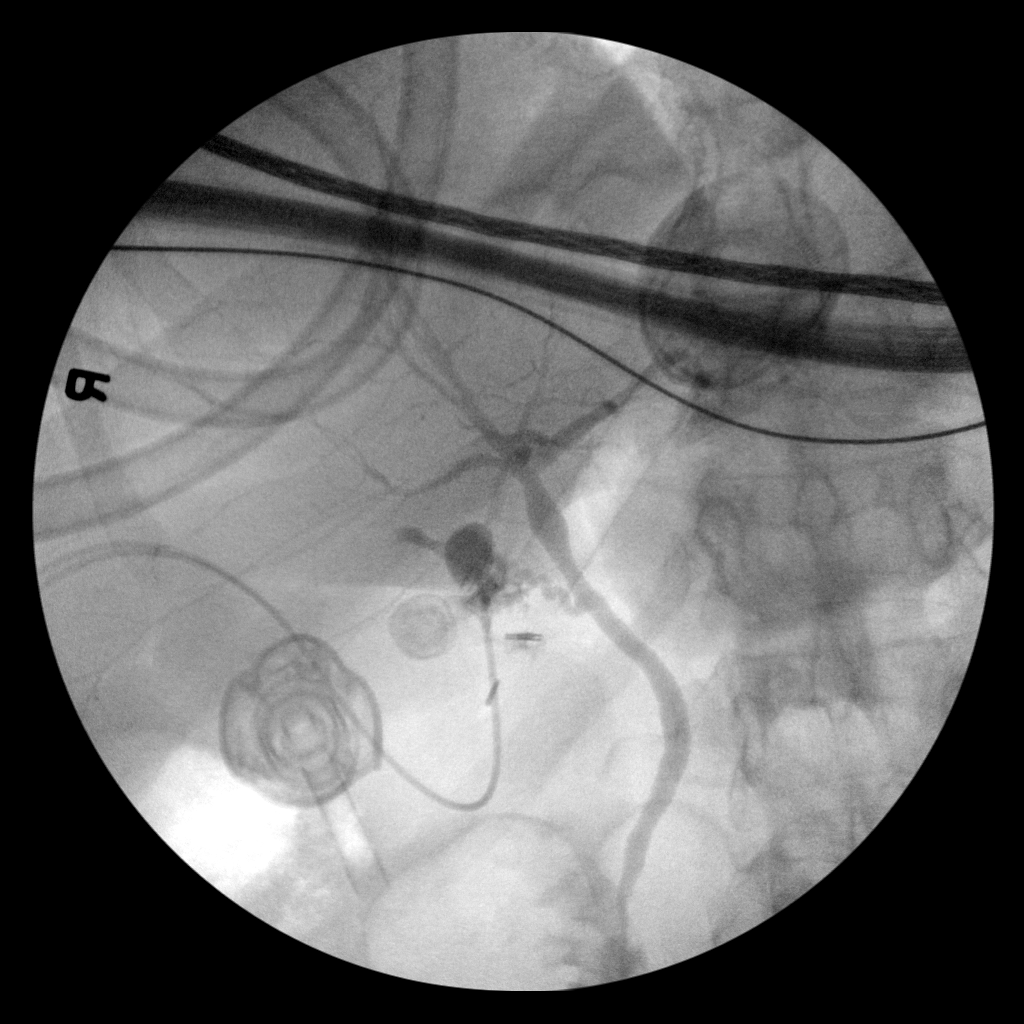

[5 of 5 positions shown; findings below may reference images not displayed]

FINDINGS: Intraoperative imaging obtained with a C-arm demonstrates a normal
opacified biliary tree without evidence of filling defect or
obstruction. Contrast enters the duodenum. There was a small amount
of focal contrast leak at the level of the cystic duct injection
site.
IMPRESSION: Unremarkable intraoperative cholangiogram.

## 2018-09-01 MED ORDER — LEVOTHYROXINE SODIUM 75 MCG PO TABS: 75.0000 ug | ORAL_TABLET | Freq: Every day | ORAL | 1 refills | Status: DC

## 2018-09-01 NOTE — Addendum Note (Signed)
Addended byDonella Stade on: 09/01/2018 01:09 PM   Modules accepted: Orders

## 2018-09-01 NOTE — Progress Notes (Signed)
Call pt: hep C negative. Cholesterol looks great. Kidney, liver, glucose look great. You remain to be HYPER thyroid. I will decrease synthroid to 52mcg daily. Recheck in 4-6 weeks TSH.

## 2018-09-04 ENCOUNTER — Other Ambulatory Visit: Payer: Self-pay

## 2018-09-04 ENCOUNTER — Ambulatory Visit (INDEPENDENT_AMBULATORY_CARE_PROVIDER_SITE_OTHER): Payer: BLUE CROSS/BLUE SHIELD

## 2018-09-04 DIAGNOSIS — R221 Localized swelling, mass and lump, neck: Secondary | ICD-10-CM | POA: Diagnosis not present

## 2018-09-04 DIAGNOSIS — E890 Postprocedural hypothyroidism: Secondary | ICD-10-CM | POA: Diagnosis not present

## 2018-09-04 DIAGNOSIS — E079 Disorder of thyroid, unspecified: Secondary | ICD-10-CM | POA: Diagnosis not present

## 2018-09-04 NOTE — Progress Notes (Signed)
Call pt: thyroid ultrasound looks good. No significant findings.

## 2018-09-09 ENCOUNTER — Telehealth: Payer: Self-pay | Admitting: Physician Assistant

## 2018-09-09 NOTE — Telephone Encounter (Signed)
Patient will bring test with her to the appointment. The test sound like a genetic testing for medications. She will also bring instructions on how to collect and send out.

## 2018-09-09 NOTE — Telephone Encounter (Signed)
I have never heard about this but if we could confirm we have the test before her appt.

## 2018-09-09 NOTE — Telephone Encounter (Signed)
Lattie Haw with [employer] [facility] [employer] called. She stated that Angelica [name] needs an Adverse Reaction Test and will be sending along a package for the testing. If you have any questions contact Lattie Haw at  [employer] [phone]. I have scheduled Angelica [name] for March 30th.  Thanks

## 2018-09-10 NOTE — Telephone Encounter (Signed)
Thanks

## 2018-09-21 ENCOUNTER — Ambulatory Visit: Payer: Medicare Other | Admitting: Physician Assistant

## 2018-10-18 ENCOUNTER — Encounter: Payer: Self-pay | Admitting: Physician Assistant

## 2018-10-18 DIAGNOSIS — K580 Irritable bowel syndrome with diarrhea: Secondary | ICD-10-CM

## 2018-10-19 MED ORDER — DICYCLOMINE HCL 10 MG PO CAPS: 10.0000 mg | ORAL_CAPSULE | Freq: Two times a day (BID) | ORAL | 1 refills | Status: DC

## 2018-10-30 ENCOUNTER — Encounter: Payer: Self-pay | Admitting: Physician Assistant

## 2018-10-30 ENCOUNTER — Other Ambulatory Visit: Payer: Self-pay | Admitting: Physician Assistant

## 2018-12-16 NOTE — Progress Notes (Signed)
Subjective:   Angelica [name] is a 68 y.o. female who presents for an Initial Medicare Annual Wellness Visit.  Review of Systems    No ROS.  Medicare Wellness Virtual Visit.  Visual/audio telehealth visit, UTA vital signs.   See social history for additional risk factors.     Cardiac Risk Factors include: advanced age (>43mn, >>72women);sedentary lifestyle;hypertension Sleep patterns: Getting at least 7 hours of sleep a night. Wakes up 1 time a night to void. Wakes up feeling refreshed.    Home Safety/Smoke Alarms: Feels safe in home. Smoke alarms in place.  Living environment; Lives with husband in a 1 story home. No stairs in the home. SHower is a step over tub and mat in place to help prevent falls. KG LPN Seat Belt Safety/Bike Helmet: Wears seat belt.   Female:   Pap-  Aged out     Mammo-  UTD     Dexa scan-ordered        CCS- UTD     Objective:    There were no vitals filed for this visit. There is no height or weight on file to calculate BMI.  Advanced Directives 12/21/2018 11/12/2017 11/06/2017 10/13/2017 11/09/2013  Does Patient Have a Medical Advance Directive? No No No No Patient does not have advance directive;Patient would not like information  Would patient like information on creating a medical advance directive? Yes (MAU/Ambulatory/Procedural Areas - Information given) No - Patient declined No - Patient declined No - Patient declined -    Current Medications (verified) Outpatient Encounter Medications as of 12/21/2018  Medication Sig  . albuterol (PROAIR HFA) 108 (90 Base) MCG/ACT inhaler Inhale 2-4 puffs into the lungs every 6 (six) hours as needed for wheezing or shortness of breath.  . busPIRone (BUSPAR) 10 MG tablet TAKE 1 TABLET(10 MG) BY MOUTH THREE TIMES DAILY  . clonazePAM (KLONOPIN) 0.5 MG tablet Take 1 tablet (0.5 mg total) by mouth 2 (two) times daily as needed. for anxiety  . dicyclomine (BENTYL) 10 MG capsule Take 1 capsule (10 mg total) by mouth 2  (two) times daily.  .Marland Kitchenescitalopram (LEXAPRO) 20 MG tablet Take 1 tablet (20 mg total) by mouth daily.  .Marland Kitchenibuprofen (ADVIL,MOTRIN) 800 MG tablet Take 1 tablet (800 mg total) by mouth every 8 (eight) hours as needed.  .Marland Kitchenlevothyroxine (SYNTHROID, LEVOTHROID) 75 MCG tablet TAKE 1 TABLET(75 MCG) BY MOUTH DAILY  . lisinopril (PRINIVIL,ZESTRIL) 40 MG tablet Take 1 tablet (40 mg total) by mouth daily.  . temazepam (RESTORIL) 30 MG capsule Take 1 capsule (30 mg total) by mouth at bedtime as needed for sleep.  . [DISCONTINUED] oxybutynin (DITROPAN) 5 MG tablet Take 1 tablet (5 mg total) by mouth 2 (two) times daily.   Facility-Administered Encounter Medications as of 12/21/2018  Medication  . ipratropium-albuterol (DUONEB) 0.5-2.5 (3) MG/3ML nebulizer solution 3 mL    Allergies (verified) Penicillins, Red dye, Sulfa antibiotics, Amitriptyline, Macrodantin, and Nitrofurantoin   History: Past Medical History:  Diagnosis Date  . Allergy   . Anxiety   . Asthma   . Breast cancer ([place] 2009  . Depression   . Dyspnea    with heavy pollen   . Hypertension   . Hypothyroidism   . Postmenopausal   . Thyroid disease    Past Surgical History:  Procedure Laterality Date  . ADENOIDECTOMY    . APPENDECTOMY    . BREAST RECONSTRUCTION  06/2008  . CHOLECYSTECTOMY N/A 11/12/2017   Procedure: LAPAROSCOPIC CHOLECYSTECTOMY WITH INTRAOPERATIVE  CHOLANGIOGRAM ERAS PATHWAY;  Surgeon: Erroll Luna, MD;  Location: [facility];  Service: General;  Laterality: N/A;  . MASTECTOMY  2009   [facility]  . TONSILLECTOMY     Family History  Problem Relation Age of Onset  . Stroke Mother   . Cancer Mother        colon?  [name] Hypertension Father   . Hyperlipidemia Father    Social History   Socioeconomic History  . Marital status: Married    Spouse name: Sonia Side  . Number of children: 1  . Years of education: 36  . Highest education level: Some college, no degree  Occupational History  . Occupation: [employer]    Employer:  [employer]: retired  Scientific laboratory technician  . Financial resource strain: Not hard at all  . Food insecurity    Worry: Never true    Inability: Never true  . Transportation needs    Medical: No    Non-medical: No  Tobacco Use  . Smoking status: Former Smoker    Quit date: 06/25/2011    Years since quitting: 7.4  . Smokeless tobacco: Never Used  Substance and Sexual Activity  . Alcohol use: No  . Drug use: No  . Sexual activity: Not Currently  Lifestyle  . Physical activity    Days per week: 7 days    Minutes per session: 30 min  . Stress: Not at all  Relationships  . Social connections    Talks on phone: More than three times a week    Gets together: Three times a week    Attends religious service: Never    Active member of club or organization: No    Attends meetings of clubs or organizations: Never    Relationship status: Married  Other Topics Concern  . Not on file  Social History Narrative   Walks 30 min per day. Retired 2 years ago.    Tobacco Counseling Counseling given: Not Answered   Clinical Intake:  Pre-visit preparation completed: Yes  Pain : No/denies pain     Nutritional Risks: None Diabetes: No  How often do you need to have someone help you when you read instructions, pamphlets, or other written materials from your doctor or pharmacy?: 1 - Never What is the last grade level you completed in school?: 14  Interpreter Needed?: No  Information entered by :: Orlie Dakin, LPN   Activities of Daily Living In your present state of health, do you have any difficulty performing the following activities: 12/21/2018  Hearing? Y  Vision? N  Difficulty concentrating or making decisions? N  Walking or climbing stairs? N  Dressing or bathing? N  Doing errands, shopping? N  Preparing Food and eating ? N  Using the Toilet? N  In the past six months, have you accidently leaked urine? N  Do you have problems with loss of bowel control? N  Managing your  Medications? N  Managing your Finances? N  Housekeeping or managing your Housekeeping? N  Some recent data might be hidden     Immunizations and Health Maintenance Immunization History  Administered Date(s) Administered  . Influenza Split 06/24/2012  . Influenza Whole 03/29/2009  . Influenza, High Dose Seasonal PF 02/26/2017, 04/02/2018  . Influenza,inj,Quad PF,6+ Mos 03/14/2014, 03/08/2015, 05/14/2016  . Influenza,trivalent, recombinat, inj, PF 05/29/2012  . Influenza-Unspecified 02/26/2017, 04/02/2018  . Pneumococcal Conjugate-13 02/26/2017  . Pneumococcal Polysaccharide-23 05/29/2012, 06/24/2012, 08/31/2018  . Pneumococcal-Unspecified 02/26/2017  . Tdap 01/31/2011  . Zoster 01/31/2011  There are no preventive care reminders to display for this patient.  Patient Care Team: Lavada Mesi as PCP - General (Family Medicine)  Indicate any recent Medical Services you may have received from other than [facility] providers in the past year (date may be approximate).     Assessment:   This is a routine wellness examination for [place].Physical assessment deferred to PCP.   Hearing/Vision screen  Hearing Screening   125Hz 250Hz 500Hz 1000Hz 2000Hz 3000Hz 4000Hz 6000Hz 8000Hz  Right ear:           Left ear:           Comments: Whisper test done and patient could not report the 3 words given.   Visual Acuity Screening   Right eye Left eye Both eyes  Without correction:     With correction: 20/25 20/25 20/20    Dietary issues and exercise activities discussed: Current Exercise Habits: The patient does not participate in regular exercise at present, Exercise limited by: None identified Diet Eats a healthy diet with meat and vegetables. Breakfast: toast and fruit Lunch: sandwich, cookies Dinner:  Meat and vegetables     Goals    . Exercise 3x per week (30 min per time)     Increase exercise     . Weight (lb) < 200 lb (90.7 kg)     Would like to loose 15lbs.       Depression Screen PHQ 2/9 Scores 12/21/2018 08/31/2018 03/02/2018 10/20/2017 11/29/2016  PHQ - 2 Score 0 0 0 2 0  PHQ- 9 Score - 0 _0 Fall Risk Fall Risk  12/21/2018  Falls in the past year? 0  Follow up Falls prevention discussed    Is the patient's home free of loose throw rugs in walkways, pet beds, electrical cords, etc?   yes      Grab bars in the bathroom? no      Handrails on the stairs?   no      Adequate lighting?   yes   Cognitive Function:     6CIT Screen 12/21/2018  What Year? 0 points  What month? 0 points  What time? 0 points  Count back from 20 0 points  Months in reverse 0 points  Repeat phrase 0 points  Total Score 0    Screening Tests Health Maintenance  Topic Date Due  . INFLUENZA VACCINE  01/23/2019  . MAMMOGRAM  04/15/2019  . PNA vac Low Risk Adult (2 of 2 - PCV13) 08/31/2019  . TETANUS/TDAP  01/30/2021  . COLONOSCOPY  03/31/2023  . DEXA SCAN  Completed  . Hepatitis C Screening  Completed        Plan:    Ms. Frenkel , Thank you for taking time to come for your Medicare Wellness Visit. I appreciate your ongoing commitment to your health goals. Please review the following plan we discussed and let me know if I can assist you in the future.  Please schedule your next medicare wellness visit with me in 1 yr. Continue doing brain stimulating activities (puzzles, reading, adult coloring books, staying active) to keep memory sharp.    These are the goals we discussed: Goals    . Exercise 3x per week (30 min per time)     Increase exercise     . Weight (lb) < 200 lb (90.7 kg)     Would like to loose 15lbs.       This is a list  of the screening recommended for you and due dates:  Health Maintenance  Topic Date Due  . Flu Shot  01/23/2019  . Mammogram  04/15/2019  . Pneumonia vaccines (2 of 2 - PCV13) 08/31/2019  . Tetanus Vaccine  01/30/2021  . Colon Cancer Screening  03/31/2023  . DEXA scan (bone density measurement)  Completed  .   Hepatitis C: One time screening is recommended by Center for Disease Control  (CDC) for  adults born from 18 through 1965.   Completed     I have personally reviewed and noted the following in the patient's chart:   . Medical and social history . Use of alcohol, tobacco or illicit drugs  . Current medications and supplements . Functional ability and status . Nutritional status . Physical activity . Advanced directives . List of other physicians . Hospitalizations, surgeries, and ER visits in previous 12 months . Vitals . Screenings to include cognitive, depression, and falls . Referrals and appointments  In addition, I have reviewed and discussed with patient certain preventive protocols, quality metrics, and best practice recommendations. A written personalized care plan for preventive services as well as general preventive health recommendations were provided to patient.     Joanne Chars, LPN   [phone]

## 2018-12-21 ENCOUNTER — Ambulatory Visit (INDEPENDENT_AMBULATORY_CARE_PROVIDER_SITE_OTHER): Payer: BC Managed Care – PPO | Admitting: *Deleted

## 2018-12-21 VITALS — BP 144/50 | HR 62 | Ht 63.5 in | Wt 160.0 lb

## 2018-12-21 DIAGNOSIS — Z0000 Encounter for general adult medical examination without abnormal findings: Secondary | ICD-10-CM | POA: Diagnosis not present

## 2018-12-21 DIAGNOSIS — Z780 Asymptomatic menopausal state: Secondary | ICD-10-CM

## 2018-12-21 DIAGNOSIS — Z13820 Encounter for screening for osteoporosis: Secondary | ICD-10-CM

## 2018-12-21 NOTE — Patient Instructions (Signed)
Angelica [name] , Thank you for taking time to come for your Medicare Wellness Visit. I appreciate your ongoing commitment to your health goals. Please review the following plan we discussed and let me know if I can assist you in the future.  Please schedule your next medicare wellness visit with me in 1 yr. Continue doing brain stimulating activities (puzzles, reading, adult coloring books, staying active) to keep memory sharp. These are the goals we discussed: Goals    . Exercise 3x per week (30 min per time)     Increase exercise     . Weight (lb) < 200 lb (90.7 kg)     Would like to loose 15lbs.

## 2018-12-30 ENCOUNTER — Other Ambulatory Visit: Payer: Self-pay

## 2018-12-30 ENCOUNTER — Encounter: Payer: Self-pay | Admitting: Physician Assistant

## 2018-12-30 ENCOUNTER — Other Ambulatory Visit: Payer: Self-pay | Admitting: Physician Assistant

## 2018-12-30 ENCOUNTER — Ambulatory Visit (INDEPENDENT_AMBULATORY_CARE_PROVIDER_SITE_OTHER): Payer: BC Managed Care – PPO

## 2018-12-30 DIAGNOSIS — Z780 Asymptomatic menopausal state: Secondary | ICD-10-CM | POA: Diagnosis not present

## 2018-12-30 DIAGNOSIS — Z0000 Encounter for general adult medical examination without abnormal findings: Secondary | ICD-10-CM

## 2018-12-30 DIAGNOSIS — M85852 Other specified disorders of bone density and structure, left thigh: Secondary | ICD-10-CM | POA: Diagnosis not present

## 2018-12-30 DIAGNOSIS — Z13820 Encounter for screening for osteoporosis: Secondary | ICD-10-CM | POA: Diagnosis not present

## 2018-12-30 DIAGNOSIS — Z1231 Encounter for screening mammogram for malignant neoplasm of breast: Secondary | ICD-10-CM

## 2019-01-12 ENCOUNTER — Other Ambulatory Visit: Payer: Self-pay | Admitting: Neurology

## 2019-01-12 MED ORDER — ATORVASTATIN CALCIUM 40 MG PO TABS: 40.0000 mg | ORAL_TABLET | Freq: Every day | ORAL | 1 refills | Status: DC

## 2019-01-25 ENCOUNTER — Encounter: Payer: Self-pay | Admitting: Physician Assistant

## 2019-01-27 ENCOUNTER — Encounter: Payer: Self-pay | Admitting: Physician Assistant

## 2019-01-27 MED ORDER — PROMETHAZINE HCL 25 MG PO TABS: 25.0000 mg | ORAL_TABLET | Freq: Four times a day (QID) | ORAL | 0 refills | Status: DC | PRN

## 2019-01-27 NOTE — Telephone Encounter (Signed)
Called and spoke patient and advised to call [facility]. Results are not in Care Everywhere.

## 2019-01-28 ENCOUNTER — Encounter: Payer: Self-pay | Admitting: Physician Assistant

## 2019-01-29 ENCOUNTER — Encounter: Payer: Self-pay | Admitting: Physician Assistant

## 2019-02-15 ENCOUNTER — Other Ambulatory Visit: Payer: Self-pay

## 2019-02-15 ENCOUNTER — Ambulatory Visit
Admission: RE | Admit: 2019-02-15 | Discharge: 2019-02-15 | Disposition: A | Discharge status: Home / Self Care | Payer: BC Managed Care – PPO | Source: Ambulatory Visit | Attending: Physician Assistant | Admitting: Physician Assistant

## 2019-02-15 ENCOUNTER — Other Ambulatory Visit: Payer: Self-pay | Admitting: Physician Assistant

## 2019-02-15 DIAGNOSIS — Z1231 Encounter for screening mammogram for malignant neoplasm of breast: Secondary | ICD-10-CM | POA: Diagnosis not present

## 2019-02-16 NOTE — Progress Notes (Signed)
Normal mammogram. Follow up in one year.

## 2019-02-20 ENCOUNTER — Other Ambulatory Visit: Payer: Self-pay | Admitting: Physician Assistant

## 2019-02-22 ENCOUNTER — Other Ambulatory Visit: Payer: Self-pay | Admitting: Physician Assistant

## 2019-03-02 ENCOUNTER — Other Ambulatory Visit: Payer: Self-pay

## 2019-03-02 ENCOUNTER — Encounter: Payer: Self-pay | Admitting: Physician Assistant

## 2019-03-02 ENCOUNTER — Ambulatory Visit (INDEPENDENT_AMBULATORY_CARE_PROVIDER_SITE_OTHER): Payer: BC Managed Care – PPO | Admitting: Physician Assistant

## 2019-03-02 ENCOUNTER — Other Ambulatory Visit: Payer: Self-pay | Admitting: Physician Assistant

## 2019-03-02 VITALS — BP 136/58 | HR 73 | Ht 63.5 in | Wt 160.0 lb

## 2019-03-02 DIAGNOSIS — M25512 Pain in left shoulder: Secondary | ICD-10-CM

## 2019-03-02 DIAGNOSIS — Z23 Encounter for immunization: Secondary | ICD-10-CM

## 2019-03-02 MED ORDER — MELOXICAM 15 MG PO TABS: 15.0000 mg | ORAL_TABLET | Freq: Every day | ORAL | 0 refills | Status: DC

## 2019-03-02 NOTE — Patient Instructions (Addendum)
Rotator Cuff Tendinitis  Rotator cuff tendinitis is inflammation of the tough, cord-like bands that connect muscle to bone (tendons) in the rotator cuff. The rotator cuff includes all of the muscles and tendons that connect the arm to the shoulder. The rotator cuff holds the head of the upper arm bone (humerus) in the cup (fossa) of the shoulder blade (scapula). This condition can lead to a long-lasting (chronic) tear. The tear may be partial or complete. What are the causes? This condition is usually caused by overusing the rotator cuff. What increases the risk? This condition is more likely to develop in athletes and workers who frequently use their shoulder or reach over their heads. This can include activities such as:  Tennis.  Baseball or softball.  Swimming.  Construction work.  Painting. What are the signs or symptoms? Symptoms of this condition include:  Pain spreading (radiating) from the shoulder to the upper arm.  Swelling and tenderness in front of the shoulder.  Pain when reaching, pulling, or lifting the arm above the head.  Pain when lowering the arm from above the head.  Minor pain in the shoulder when resting.  Increased pain in the shoulder at night.  Difficulty placing the arm behind the back. How is this diagnosed? This condition is diagnosed with a medical history and physical exam. Tests may also be done, including:  X-rays.  MRI.  Ultrasounds.  CT or MR arthrogram. During this test, a contrast material is injected and then images are taken. How is this treated? Treatment for this condition depends on the severity of the condition. In less severe cases, treatment may include:  Rest. This may be done with a sling that holds the shoulder still (immobilization). Your health care provider may also recommend avoiding activities that involve lifting your arm over your head.  Icing the shoulder.  Anti-inflammatory medicines, such as aspirin or  ibuprofen. In more severe cases, treatment may include:  Physical therapy.  Steroid injections.  Surgery. Follow these instructions at home: If you have a sling:  Wear the sling as told by your health care provider. Remove it only as told by your health care provider.  Loosen the sling if your fingers tingle, become numb, or turn cold and blue.  Keep the sling clean.  If the sling is not waterproof, do not let it get wet. Remove it, if allowed, or cover it with a watertight covering when you take a bath or shower. Managing pain, stiffness, and swelling  If directed, put ice on the injured area. ? If you have a removable sling, remove it as told by your health care provider. ? Put ice in a plastic bag. ? Place a towel between your skin and the bag. ? Leave the ice on for 20 minutes, 2-3 times a day.  Move your fingers often to avoid stiffness and to lessen swelling.  Raise (elevate) the injured area above the level of your heart while you are lying down.  Find a comfortable sleeping position or sleep on a recliner, if available. Driving  Do not drive or use heavy machinery while taking prescription pain medicine.  Ask your health care provider when it is safe to drive if you have a sling on your arm. Activity  Rest your shoulder as told by your health care provider.  Return to your normal activities as told by your health care provider. Ask your health care provider what activities are safe for you.  Do any exercises or stretches as   told by your health care provider.  If you do repetitive overhead tasks, take small breaks in between and include stretching exercises as told by your health care provider. General instructions  Do not use any products that contain nicotine or tobacco, such as cigarettes and e-cigarettes. These can delay healing. If you need help quitting, ask your health care provider.  Take over-the-counter and prescription medicines only as told by your  health care provider.  Keep all follow-up visits as told by your health care provider. This is important. Contact a health care provider if:  Your pain gets worse.  You have new pain in your arm, hands, or fingers.  Your pain is not relieved with medicine or does not get better after 6 weeks of treatment.  You have cracking sensations when moving your shoulder in certain directions.  You hear a snapping sound after using your shoulder, followed by severe pain and weakness. Get help right away if:  Your arm, hand, or fingers are numb or tingling.  Your arm, hand, or fingers are swollen or painful or they turn white or blue. Summary  Rotator cuff tendinitis is inflammation of the tough, cord-like bands that connect muscle to bone (tendons) in the rotator cuff.  This condition is usually caused by overusing the rotator cuff, which includes all of the muscles and tendons that connect the arm to the shoulder.  This condition is more likely to develop in athletes and workers who frequently use their shoulder or reach over their heads.  Treatment generally includes rest, anti-inflammatory medicines, and icing. In some cases, physical therapy and steroid injections may be needed. In severe cases, surgery may be needed. This information is not intended to replace advice given to you by your health care provider. Make sure you discuss any questions you have with your health care provider. Document Released: 08/31/2003 Document Revised: 10/02/2018 Document Reviewed: 05/27/2016 Elsevier Patient Education  2020 [employer] Inc[employer]  
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
## 2019-03-03 ENCOUNTER — Encounter: Payer: Self-pay | Admitting: Physician Assistant

## 2019-03-08 ENCOUNTER — Encounter: Payer: Self-pay | Admitting: Physician Assistant

## 2019-03-08 ENCOUNTER — Other Ambulatory Visit: Payer: Self-pay

## 2019-03-08 ENCOUNTER — Ambulatory Visit (INDEPENDENT_AMBULATORY_CARE_PROVIDER_SITE_OTHER): Payer: BC Managed Care – PPO | Admitting: Physician Assistant

## 2019-03-08 VITALS — BP 152/72 | HR 68 | Ht 63.5 in | Wt 160.0 lb

## 2019-03-08 DIAGNOSIS — I10 Essential (primary) hypertension: Secondary | ICD-10-CM | POA: Diagnosis not present

## 2019-03-08 DIAGNOSIS — F411 Generalized anxiety disorder: Secondary | ICD-10-CM | POA: Diagnosis not present

## 2019-03-08 DIAGNOSIS — E039 Hypothyroidism, unspecified: Secondary | ICD-10-CM

## 2019-03-08 DIAGNOSIS — F419 Anxiety disorder, unspecified: Secondary | ICD-10-CM

## 2019-03-08 DIAGNOSIS — K580 Irritable bowel syndrome with diarrhea: Secondary | ICD-10-CM | POA: Diagnosis not present

## 2019-03-08 DIAGNOSIS — M25512 Pain in left shoulder: Secondary | ICD-10-CM | POA: Diagnosis not present

## 2019-03-08 DIAGNOSIS — J4520 Mild intermittent asthma, uncomplicated: Secondary | ICD-10-CM

## 2019-03-08 DIAGNOSIS — F32A Depression, unspecified: Secondary | ICD-10-CM

## 2019-03-08 DIAGNOSIS — F329 Major depressive disorder, single episode, unspecified: Secondary | ICD-10-CM | POA: Diagnosis not present

## 2019-03-08 LAB — TSH: TSH: 2.51 mIU/L (ref 0.40–4.50)

## 2019-03-08 MED ORDER — ALBUTEROL SULFATE HFA 108 (90 BASE) MCG/ACT IN AERS: 2.0000 | INHALATION_SPRAY | Freq: Four times a day (QID) | RESPIRATORY_TRACT | 2 refills | Status: DC | PRN

## 2019-03-08 MED ORDER — ESCITALOPRAM OXALATE 20 MG PO TABS: 20.0000 mg | ORAL_TABLET | Freq: Every day | ORAL | 3 refills | Status: DC

## 2019-03-08 MED ORDER — CLONAZEPAM 0.5 MG PO TABS: 0.5000 mg | ORAL_TABLET | Freq: Two times a day (BID) | ORAL | 5 refills | Status: DC | PRN

## 2019-03-08 MED ORDER — LISINOPRIL 40 MG PO TABS: 40.0000 mg | ORAL_TABLET | Freq: Every day | ORAL | 3 refills | Status: DC

## 2019-03-08 MED ORDER — DICYCLOMINE HCL 10 MG PO CAPS: 10.0000 mg | ORAL_CAPSULE | Freq: Two times a day (BID) | ORAL | 1 refills | Status: DC

## 2019-03-08 MED ORDER — TEMAZEPAM 30 MG PO CAPS: 30.0000 mg | ORAL_CAPSULE | Freq: Every evening | ORAL | 1 refills | Status: DC | PRN

## 2019-03-08 MED ORDER — BUSPIRONE HCL 10 MG PO TABS: ORAL_TABLET | ORAL | 1 refills | Status: DC

## 2019-03-08 NOTE — Progress Notes (Addendum)
Established Patient Office Visit  Subjective:  Patient ID: Angelica [name], female    DOB: Jan 28, 1951  Age: 68 y.o. MRN: [id number]  CC:  Chief Complaint  Patient presents with  . Hypertension  . Follow-up    HPI JERALD [name] presents for 6 month follow up on medications. Patient was also seen in office 03/02/19 for left shoulder pain. Patient states that her pain is much improved and she is continuing to take the Molixicam '15mg'$  every night. Denies neck and back pain. Patient states that she has been doing the exercises she was given. Patient reports no other complaints.   She is sleeping well. Her anxiety is pretty well controlled. No SI/HC. Overall IBS symptoms controlled with bentyl once a day.    She denies any CP, palpitations, headaches. She is not checking her BP at home. She is taking her medications daily.   Hypothyroidism- last TSH was elevated. Needs labs. No complaints. Taking medication daily.   Pt has intermittent asthma symptoms. She needs refill of albuterol. She does not use often but her ragweed allergies worsen in the fall.       Past Medical History:  Diagnosis Date  . Allergy   . Anxiety   . Asthma   . Breast cancer ([place]) 2009  . Depression   . Dyspnea    with heavy pollen   . Hypertension   . Hypothyroidism   . Postmenopausal   . Thyroid disease     Past Surgical History:  Procedure Laterality Date  . ADENOIDECTOMY    . APPENDECTOMY    . BREAST RECONSTRUCTION  06/2008  . CHOLECYSTECTOMY N/A 11/12/2017   Procedure: LAPAROSCOPIC CHOLECYSTECTOMY WITH INTRAOPERATIVE CHOLANGIOGRAM ERAS PATHWAY;  Surgeon: Erroll Luna, MD;  Location: [facility];  Service: General;  Laterality: N/A;  . MASTECTOMY  2009   [facility]  . TONSILLECTOMY      Family History  Problem Relation Age of Onset  . Stroke Mother   . Cancer Mother        colon?  [name] Hypertension Father   . Hyperlipidemia Father     Social History   Socioeconomic History  . Marital status: Married     Spouse name: Sonia Side  . Number of children: 1  . Years of education: 92  . Highest education level: Some college, no degree  Occupational History  . Occupation: [employer]    Employer: [employer]: retired  Scientific laboratory technician  . Financial resource strain: Not hard at all  . Food insecurity    Worry: Never true    Inability: Never true  . Transportation needs    Medical: No    Non-medical: No  Tobacco Use  . Smoking status: Former Smoker    Quit date: 06/25/2011    Years since quitting: 7.7  . Smokeless tobacco: Never Used  Substance and Sexual Activity  . Alcohol use: No  . Drug use: No  . Sexual activity: Not Currently  Lifestyle  . Physical activity    Days per week: 7 days    Minutes per session: 30 min  . Stress: Not at all  Relationships  . Social connections    Talks on phone: More than three times a week    Gets together: Three times a week    Attends religious service: Never    Active member of club or organization: No    Attends meetings of clubs or organizations: Never    Relationship status: Married  .  Intimate partner violence    Fear of current or ex partner: No    Emotionally abused: No    Physically abused: No    Forced sexual activity: No  Other Topics Concern  . Not on file  Social History Narrative   Walks 30 min per day. Retired 2 years ago.    Outpatient Medications Prior to Visit  Medication Sig Dispense Refill  . atorvastatin (LIPITOR) 40 MG tablet Take 1 tablet (40 mg total) by mouth daily. 90 tablet 1  . levothyroxine (SYNTHROID) 75 MCG tablet Take 1 tablet (75 mcg total) by mouth daily before breakfast. NEEDS LABS 30 tablet 0  . meloxicam (MOBIC) 15 MG tablet TAKE 1 TABLET(15 MG) BY MOUTH DAILY 90 tablet 1  . promethazine (PHENERGAN) 25 MG tablet Take 1 tablet (25 mg total) by mouth every 6 (six) hours as needed for nausea or vomiting. 30 tablet 0  . albuterol (PROAIR HFA) 108 (90 Base) MCG/ACT inhaler Inhale 2-4 puffs into the lungs every 6  (six) hours as needed for wheezing or shortness of breath. 18 g 2  . busPIRone (BUSPAR) 10 MG tablet TAKE 1 TABLET(10 MG) BY MOUTH THREE TIMES DAILY 270 tablet 1  . clonazePAM (KLONOPIN) 0.5 MG tablet Take 1 tablet (0.5 mg total) by mouth 2 (two) times daily as needed. for anxiety 60 tablet 5  . dicyclomine (BENTYL) 10 MG capsule Take 1 capsule (10 mg total) by mouth 2 (two) times daily. 180 capsule 1  . escitalopram (LEXAPRO) 20 MG tablet Take 1 tablet (20 mg total) by mouth daily. 90 tablet 1  . lisinopril (PRINIVIL,ZESTRIL) 40 MG tablet Take 1 tablet (40 mg total) by mouth daily. 90 tablet 1  . temazepam (RESTORIL) 30 MG capsule Take 1 capsule (30 mg total) by mouth at bedtime as needed for sleep. 90 capsule 1   Facility-Administered Medications Prior to Visit  Medication Dose Route Frequency Provider Last Rate Last Dose  . ipratropium-albuterol (DUONEB) 0.5-2.5 (3) MG/3ML nebulizer solution 3 mL  3 mL Nebulization Q6H Gregor Hams, MD        Allergies  Allergen Reactions  . Penicillins Hives, Swelling, Rash and Other (See Comments)    PATIENT HAS HAD A PCN REACTION WITH IMMEDIATE RASH, FACIAL/TONGUE/THROAT SWELLING, SOB, OR LIGHTHEADEDNESS WITH HYPOTENSION:  #  #  YES  #  # Has patient had a PCN reaction causing severe rash involving mucus membranes or skin necrosis: NO PATIENT HAS HAD A PCN REACTION THAT REQUIRED HOSPITALIZATION:  #  #  YES  #  #  Has patient had a PCN reaction occurring within the last 10 years: NO    . Red Dye Hives  . Sulfa Antibiotics Nausea And Vomiting and Hives  . Amitriptyline Other (See Comments)    Crazy dreams    . Macrodantin Nausea And Vomiting  . Nitrofurantoin Nausea And Vomiting    ROS Review of Systems  Constitutional: Negative for fever and unexpected weight change.  Eyes: Negative for visual disturbance.  Respiratory: Negative for shortness of breath.   Cardiovascular: Negative for chest pain.  Musculoskeletal: Negative for back pain and  neck pain.  Psychiatric/Behavioral: The patient is not nervous/anxious.        Denies any mood changes      Objective:    Physical Exam  Constitutional: She appears well-developed. No distress.  HENT:  Head: Normocephalic and atraumatic.  Eyes: Pupils are equal, round, and reactive to light. Conjunctivae and EOM are normal.  Neck:  Normal range of motion. Neck supple. No thyromegaly present.  Cardiovascular: Normal rate, regular rhythm and normal heart sounds.  Pulmonary/Chest: Effort normal and breath sounds normal.  Abdominal: Soft. Bowel sounds are normal. There is no abdominal tenderness.  Musculoskeletal:        General: No tenderness.     Comments: There is no TTP of the left shoulder joint, left upper extremity, neck or back.  Muscle strength 5/5.  Adduction and abduction (120 degrees) intact without pain.  Negative drop arm test.  DTRs of biceps, brachioradialis, triceps, patellar tendons 2+ bilaterally.      BP (!) 166/58   Pulse 68   Ht 5' 3.5" (1.613 m)   Wt 160 lb (72.6 kg)   LMP 06/24/2000   SpO2 99%   BMI 27.90 kg/m  Wt Readings from Last 3 Encounters:  03/08/19 160 lb (72.6 kg)  03/02/19 160 lb (72.6 kg)  12/21/18 160 lb (72.6 kg)   There are no preventive care reminders to display for this patient.  There are no preventive care reminders to display for this patient.  Lab Results  Component Value Date   TSH 0.07 (L) 08/31/2018   Lab Results  Component Value Date   WBC 8.7 11/06/2017   HGB 14.2 11/06/2017   HCT 42.0 11/06/2017   MCV 83.2 11/06/2017   PLT 219 11/06/2017   Lab Results  Component Value Date   NA 144 08/31/2018   K 3.6 08/31/2018   CHLORIDE 109 01/21/2013   CO2 29 08/31/2018   GLUCOSE 94 08/31/2018   BUN 10 08/31/2018   CREATININE 0.66 08/31/2018   BILITOT 1.2 08/31/2018   ALKPHOS 110 11/06/2017   AST 16 08/31/2018   ALT 11 08/31/2018   PROT 6.5 08/31/2018   ALBUMIN 4.0 11/06/2017   CALCIUM 9.2 08/31/2018   ANIONGAP 10  11/06/2017   Lab Results  Component Value Date   CHOL 162 08/31/2018   Lab Results  Component Value Date   HDL 36 (L) 08/31/2018   Lab Results  Component Value Date   LDLCALC 103 (H) 08/31/2018   Lab Results  Component Value Date   TRIG 125 08/31/2018   Lab Results  Component Value Date   CHOLHDL 4.5 08/31/2018   Lab Results  Component Value Date   HGBA1C 5.2 01/31/2011      Assessment & Plan:   Problem List Items Addressed This Visit      Unprioritized   GAD (generalized anxiety disorder)   Relevant Medications   busPIRone (BUSPAR) 10 MG tablet   escitalopram (LEXAPRO) 20 MG tablet   Asthma   Relevant Medications   albuterol (PROAIR HFA) 108 (90 Base) MCG/ACT inhaler   Hypothyroid - Primary   Relevant Orders   TSH   Anxiety and depression   Relevant Medications   busPIRone (BUSPAR) 10 MG tablet   clonazePAM (KLONOPIN) 0.5 MG tablet   escitalopram (LEXAPRO) 20 MG tablet   temazepam (RESTORIL) 30 MG capsule   IBS (irritable bowel syndrome)   Relevant Medications   dicyclomine (BENTYL) 10 MG capsule   Essential hypertension   Relevant Medications   lisinopril (ZESTRIL) 40 MG tablet    Other Visit Diagnoses    Acute pain of left shoulder          Meds ordered this encounter  Medications  . DISCONTD: albuterol (PROAIR HFA) 108 (90 Base) MCG/ACT inhaler    Sig: Inhale 2-4 puffs into the lungs every 6 (six) hours as needed for wheezing  or shortness of breath.    Dispense:  18 g    Refill:  2  . busPIRone (BUSPAR) 10 MG tablet    Sig: TAKE 1 TABLET(10 MG) BY MOUTH THREE TIMES DAILY    Dispense:  270 tablet    Refill:  1    Order Specific Question:   Supervising Provider    Answer:   Beatrice Lecher D [[id number]]  . clonazePAM (KLONOPIN) 0.5 MG tablet    Sig: Take 1 tablet (0.5 mg total) by mouth 2 (two) times daily as needed. for anxiety    Dispense:  60 tablet    Refill:  5    Order Specific Question:   Supervising Provider    Answer:   Beatrice Lecher D [[id number]]  . dicyclomine (BENTYL) 10 MG capsule    Sig: Take 1 capsule (10 mg total) by mouth 2 (two) times daily.    Dispense:  180 capsule    Refill:  1    Order Specific Question:   Supervising Provider    Answer:   Beatrice Lecher D [[id number]]  . escitalopram (LEXAPRO) 20 MG tablet    Sig: Take 1 tablet (20 mg total) by mouth daily.    Dispense:  90 tablet    Refill:  3    Order Specific Question:   Supervising Provider    Answer:   Beatrice Lecher D [[id number]]  . lisinopril (ZESTRIL) 40 MG tablet    Sig: Take 1 tablet (40 mg total) by mouth daily.    Dispense:  90 tablet    Refill:  3    Order Specific Question:   Supervising Provider    Answer:   Beatrice Lecher D [[id number]]  . temazepam (RESTORIL) 30 MG capsule    Sig: Take 1 capsule (30 mg total) by mouth at bedtime as needed for sleep.    Dispense:  90 capsule    Refill:  1    Order Specific Question:   Supervising Provider    Answer:   Beatrice Lecher D [[id number]]  . albuterol (PROAIR HFA) 108 (90 Base) MCG/ACT inhaler    Sig: Inhale 2-4 puffs into the lungs every 6 (six) hours as needed for wheezing or shortness of breath.    Dispense:  18 g    Refill:  2    Order Specific Question:   Supervising Provider    Answer:   Hali Marry [[id number]]    Patient was doing well and feeling much better than she was when she came in with shoulder pain on 03/02/19. Talked about how she is living with her daughter and two grandchildren and the struggles of helping them with online school. Medications refilled.  Sent to get her thyroid rechecked. Will send refills after results.    BP elevated today. She did not take meds this morning. Will check at home.   Follow up in 1 month for left shoulder pain/BP follow up.   Iran Planas, PA-C

## 2019-03-09 MED ORDER — LEVOTHYROXINE SODIUM 75 MCG PO TABS: 75.0000 ug | ORAL_TABLET | Freq: Every day | ORAL | 3 refills | Status: DC

## 2019-03-09 NOTE — Addendum Note (Signed)
Addended by: Donella Stade on: 03/09/2019 06:14 AM   Modules accepted: Orders

## 2019-03-09 NOTE — Progress Notes (Signed)
Thyroid is perfect. Will send refill.

## 2019-05-17 ENCOUNTER — Encounter: Payer: Self-pay | Admitting: Physician Assistant

## 2019-05-18 MED ORDER — TRAMADOL HCL 50 MG PO TABS: 50.0000 mg | ORAL_TABLET | Freq: Four times a day (QID) | ORAL | 0 refills | Status: AC | PRN

## 2019-05-21 ENCOUNTER — Encounter: Payer: Self-pay | Admitting: Family Medicine

## 2019-05-21 ENCOUNTER — Emergency Department (INDEPENDENT_AMBULATORY_CARE_PROVIDER_SITE_OTHER): Payer: BC Managed Care – PPO

## 2019-05-21 ENCOUNTER — Emergency Department (INDEPENDENT_AMBULATORY_CARE_PROVIDER_SITE_OTHER)
Admission: EM | Admit: 2019-05-21 | Discharge: 2019-05-21 | Disposition: A | Discharge status: Home / Self Care | Payer: BC Managed Care – PPO | Source: Home / Self Care

## 2019-05-21 ENCOUNTER — Other Ambulatory Visit: Payer: Self-pay

## 2019-05-21 DIAGNOSIS — M25512 Pain in left shoulder: Secondary | ICD-10-CM | POA: Diagnosis not present

## 2019-05-21 DIAGNOSIS — M7502 Adhesive capsulitis of left shoulder: Secondary | ICD-10-CM

## 2019-05-21 MED ORDER — OXYCODONE-ACETAMINOPHEN 5-325 MG PO TABS: 1.0000 | ORAL_TABLET | Freq: Four times a day (QID) | ORAL | 0 refills | Status: DC | PRN

## 2019-05-21 NOTE — ED Triage Notes (Signed)
Pt c/o LT shoulder pain x 2 mos. Has seen PCP and was given a Cortizone shot. Pain returned a week ago and seems to be worsening. Taking tylenol prn. Was given tramadol by PCP which doesn't seem to help.

## 2019-05-21 NOTE — Discharge Instructions (Addendum)
Gentle range of motion and follow up with [name].

## 2019-05-21 NOTE — ED Provider Notes (Signed)
Angelica [name] CARE    CSN: [id number] Arrival date & time: 05/21/19  1255      History   Chief Complaint Chief Complaint  Patient presents with  . LT shoulder pain    HPI Angelica [name] is a 68 y.o. female.   Established 68 yo [place] woman with left shoulder pain.  This is an ongoing problem for several months.  She rec'd a subacromial steroid injection in early September.  Now pain is throughout shoulder and her trapezius is tightening up all the way to the left side of lower neck.  Patient reluctant to take oral prednisone for fear of weight gain.     Patient has been in contact with PCP who ordered x-ray 4 days ago and called in Tramadol.  The Tramadol is not working.  The injection did control the pain for 2 months.  Patient is doing her ROM exercises but now cannot raise arm more than 20 degrees in any direction and she dropped an item at the store recently when she reached to take it off the shelf.  Past Medical History:  Diagnosis Date  . Allergy   . Anxiety   . Asthma   . Breast cancer ([place]) 2009  . Depression   . Dyspnea    with heavy pollen   . Hypertension   . Hypothyroidism   . Postmenopausal   . Thyroid disease     Patient Active Problem List   Diagnosis Date Noted  . Neck fullness 08/31/2018  . Colon polyps 03/31/2018  . Left-sided nosebleed 03/02/2018  . TMJ pain dysfunction syndrome 03/02/2018  . Calculus of gallbladder 10/21/2017  . Elevated lipase 10/21/2017  . Diarrhea 10/21/2017  . Upper abdominal pain 10/21/2017  . Cellulitis and abscess of buttock 06/04/2017  . Essential hypertension 06/11/2016  . Epistaxis 11/14/2015  . Trapezius muscle spasm 09/28/2015  . Elevated blood pressure 04/26/2015  . Vitamin D deficiency 03/09/2015  . Dyslipidemia (high LDL; low HDL) 03/09/2015  . OAB (overactive bladder) 03/08/2015  . IBS (irritable bowel syndrome) 08/22/2014  . Insomnia 03/15/2014  . Chondromalacia of right patellofemoral joint  02/10/2014  . Anxiety and depression 08/03/2013  . Episodic mood disorder ([place]) 10/17/2012  . Breast cancer ([place]) 01/31/2011  . GAD (generalized anxiety disorder) 01/31/2011  . Asthma 01/31/2011  . Seasonal allergies 01/31/2011  . Hypothyroid 01/31/2011    Past Surgical History:  Procedure Laterality Date  . ADENOIDECTOMY    . APPENDECTOMY    . BREAST RECONSTRUCTION  06/2008  . CHOLECYSTECTOMY N/A 11/12/2017   Procedure: LAPAROSCOPIC CHOLECYSTECTOMY WITH INTRAOPERATIVE CHOLANGIOGRAM ERAS PATHWAY;  Surgeon: Erroll Luna, MD;  Location: [facility];  Service: General;  Laterality: N/A;  . MASTECTOMY  2009   BrCa  . TONSILLECTOMY      OB History   No obstetric history on file.      Home Medications    Prior to Admission medications   Medication Sig Start Date End Date Taking? Authorizing Provider  albuterol (PROAIR HFA) 108 (90 Base) MCG/ACT inhaler Inhale 2-4 puffs into the lungs every 6 (six) hours as needed for wheezing or shortness of breath. 03/08/19   Breeback, [name] L, PA-C  atorvastatin (LIPITOR) 40 MG tablet Take 1 tablet (40 mg total) by mouth daily. 01/12/19   Breeback, [name] L, PA-C  busPIRone (BUSPAR) 10 MG tablet TAKE 1 TABLET(10 MG) BY MOUTH THREE TIMES DAILY 03/08/19   Breeback, [name] L, PA-C  clonazePAM (KLONOPIN) 0.5 MG tablet Take 1 tablet (0.5 mg  total) by mouth 2 (two) times daily as needed. for anxiety 03/08/19   Iran Planas L, PA-C  dicyclomine (BENTYL) 10 MG capsule Take 1 capsule (10 mg total) by mouth 2 (two) times daily. 03/08/19   Breeback, [name] L, PA-C  escitalopram (LEXAPRO) 20 MG tablet Take 1 tablet (20 mg total) by mouth daily. 03/08/19   Donella Stade, PA-C  levothyroxine (SYNTHROID) 75 MCG tablet Take 1 tablet (75 mcg total) by mouth daily before breakfast. 03/09/19   Breeback, [name] L, PA-C  lisinopril (ZESTRIL) 40 MG tablet Take 1 tablet (40 mg total) by mouth daily. 03/08/19   Breeback, [name] L, PA-C  meloxicam (MOBIC) 15 MG tablet TAKE 1 TABLET(15 MG)  BY MOUTH DAILY 03/03/19   Breeback, [name] L, PA-C  oxyCODONE-acetaminophen (PERCOCET/ROXICET) 5-325 MG tablet Take 1 tablet by mouth every 6 (six) hours as needed for severe pain. 05/21/19   Robyn Haber, MD  promethazine (PHENERGAN) 25 MG tablet Take 1 tablet (25 mg total) by mouth every 6 (six) hours as needed for nausea or vomiting. 01/27/19   Breeback, [name] L, PA-C  temazepam (RESTORIL) 30 MG capsule Take 1 capsule (30 mg total) by mouth at bedtime as needed for sleep. 03/08/19   Breeback, Royetta Car, PA-C  traMADol (ULTRAM) 50 MG tablet Take 1 tablet (50 mg total) by mouth every 6 (six) hours as needed for up to 5 days. 05/18/19 05/23/19  Donella Stade, PA-C    Family History Family History  Problem Relation Age of Onset  . Stroke Mother   . Cancer Mother        colon?  [name] Hypertension Father   . Hyperlipidemia Father     Social History Social History   Tobacco Use  . Smoking status: Former Smoker    Quit date: 06/25/2011    Years since quitting: 7.9  . Smokeless tobacco: Never Used  Substance Use Topics  . Alcohol use: No  . Drug use: No     Allergies   Penicillins, Red dye, Sulfa antibiotics, Amitriptyline, Macrodantin, and Nitrofurantoin   Review of Systems Review of Systems  All other systems reviewed and are negative.    Physical Exam Triage Vital Signs ED Triage Vitals  Enc Vitals Group     BP      Pulse      Resp      Temp      Temp src      SpO2      Weight      Height      Head Circumference      Peak Flow      Pain Score      Pain Loc      Pain Edu?      Excl. in [place]?    No data found.  Updated Vital Signs BP (!) 161/84 (BP Location: Left Arm)   Pulse 61   Temp 98.4 F (36.9 C) (Oral)   Resp 18   LMP 06/24/2000   SpO2 99%   Physical Exam Vitals signs reviewed.  Constitutional:      General: She is not in acute distress.    Appearance: Normal appearance. She is normal weight. She is not ill-appearing.  HENT:     Head: Normocephalic.   Eyes:     Conjunctiva/sclera: Conjunctivae normal.  Neck:     Musculoskeletal: Normal range of motion and neck supple. Muscular tenderness present.     Comments: Some mild proximal and mid trapezius tenderness  Cardiovascular:     Rate and Rhythm: Normal rate.  Pulmonary:     Effort: Pulmonary effort is normal.  Musculoskeletal:        General: Tenderness present. No deformity or signs of injury.     Comments: Left shoulder: Positive empty can test Tender anterior joint line Unable to abduct > 20 degrees  Skin:    General: Skin is warm and dry.  Neurological:     General: No focal deficit present.     Mental Status: She is alert.  Psychiatric:        Mood and Affect: Mood normal.        Behavior: Behavior normal.      UC Treatments / Results  Labs (all labs ordered are listed, but only abnormal results are displayed) Labs Reviewed - No data to display  EKG   Radiology Dg Shoulder Left  Result Date: 05/21/2019 CLINICAL DATA:  LEFT shoulder pain for 3 months, no history of injury EXAM: LEFT SHOULDER - 2+ VIEW COMPARISON:  None FINDINGS: Osseous demineralization. AC joint alignment normal. Visualized LEFT ribs intact. No acute fracture, dislocation, or bone destruction. IMPRESSION: No acute abnormalities. Electronically Signed   By: Lavonia Dana M.D.   On: 05/21/2019 14:56    Procedures Join Aspiration/Injection  Date/Time: 05/21/2019 3:10 PM Performed by: Robyn Haber, MD Authorized by: Robyn Haber, MD   Consent:    Consent obtained:  Verbal   Consent given by:  Patient   Risks discussed:  Pain   Alternatives discussed:  No treatment Location:    Location:  Shoulder   Shoulder:  L glenohumeral Anesthesia (see MAR for exact dosages):    Anesthesia method:  None Procedure details:    Needle gauge:  95 G   Ultrasound guidance: no     Approach:  Anterior   Steroid injected: yes     Specimen collected: no   Post-procedure details:    Dressing:   Sterile dressing   Patient tolerance of procedure:  Tolerated well, no immediate complications   (including critical care time)  Medications Ordered in UC Medications - No data to display  Initial Impression / Assessment and Plan / UC Course  I have reviewed the triage vital signs and the nursing notes.  Pertinent labs & imaging results that were available during my care of the patient were reviewed by me and considered in my medical decision making (see chart for details).    Final Clinical Impressions(s) / UC Diagnoses   Final diagnoses:  Adhesive capsulitis of left shoulder     Discharge Instructions     Gentle range of motion and follow up with [name].    ED Prescriptions    Medication Sig Dispense Auth. Provider   oxyCODONE-acetaminophen (PERCOCET/ROXICET) 5-325 MG tablet Take 1 tablet by mouth every 6 (six) hours as needed for severe pain. 12 tablet Robyn Haber, MD     I have reviewed the PDMP during this encounter.   Robyn Haber, MD 05/21/19 1511

## 2019-05-23 ENCOUNTER — Encounter: Payer: Self-pay | Admitting: Physician Assistant

## 2019-05-23 DIAGNOSIS — F329 Major depressive disorder, single episode, unspecified: Secondary | ICD-10-CM

## 2019-05-23 DIAGNOSIS — F32A Depression, unspecified: Secondary | ICD-10-CM

## 2019-05-24 ENCOUNTER — Encounter: Payer: Self-pay | Admitting: Physician Assistant

## 2019-05-24 NOTE — Telephone Encounter (Signed)
Refill completed. Please advise on who to follow up with

## 2019-05-26 ENCOUNTER — Ambulatory Visit (INDEPENDENT_AMBULATORY_CARE_PROVIDER_SITE_OTHER): Payer: BC Managed Care – PPO | Admitting: Sports Medicine

## 2019-05-26 ENCOUNTER — Encounter: Payer: Self-pay | Admitting: Sports Medicine

## 2019-05-26 ENCOUNTER — Other Ambulatory Visit: Payer: Self-pay

## 2019-05-26 DIAGNOSIS — M75102 Unspecified rotator cuff tear or rupture of left shoulder, not specified as traumatic: Secondary | ICD-10-CM | POA: Diagnosis not present

## 2019-05-26 DIAGNOSIS — M25512 Pain in left shoulder: Secondary | ICD-10-CM

## 2019-05-26 MED ORDER — MELOXICAM 15 MG PO TABS: ORAL_TABLET | ORAL | 3 refills | Status: DC

## 2019-05-26 NOTE — Progress Notes (Signed)
Subjective:    CC: Left shoulder pain  HPI: This is a very pleasant 68 year old female, for months now she is had pain in her left shoulder, localized over the deltoid, she had a subacromial injection by her PCP that provided a couple of months of relief, unfortunately had a recurrence of pain, went to urgent care and was diagnosed with frozen shoulder.  Oral narcotics have not been tremendously effective.  She really has not had any formal PT, no advanced imaging.  I reviewed the past medical history, family history, social history, surgical history, and allergies today and no changes were needed.  Please see the problem list section below in epic for further details.  Past Medical History: Past Medical History:  Diagnosis Date  . Allergy   . Anxiety   . Asthma   . Breast cancer ([place]) 2009  . Depression   . Dyspnea    with heavy pollen   . Hypertension   . Hypothyroidism   . Postmenopausal   . Thyroid disease    Past Surgical History: Past Surgical History:  Procedure Laterality Date  . ADENOIDECTOMY    . APPENDECTOMY    . BREAST RECONSTRUCTION  06/2008  . CHOLECYSTECTOMY N/A 11/12/2017   Procedure: LAPAROSCOPIC CHOLECYSTECTOMY WITH INTRAOPERATIVE CHOLANGIOGRAM ERAS PATHWAY;  Surgeon: Erroll Luna, MD;  Location: [facility];  Service: General;  Laterality: N/A;  . MASTECTOMY  2009   BrCa  . TONSILLECTOMY     Social History: Social History   Socioeconomic History  . Marital status: Married    Spouse name: Sonia Side  . Number of children: 1  . Years of education: 32  . Highest education level: Some college, no degree  Occupational History  . 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 Financial resource strain: Not hard at all  . Food insecurity    Worry: Never true    Inability: Never true  . Transportation needs    Medical: No    Non-medical: No  Tobacco Use  . Smoking status: Former Smoker    Quit date: 06/25/2011    Years since  quitting: 7.9  . Smokeless tobacco: Never Used  Substance and Sexual Activity  . Alcohol use: No  . Drug use: No  . Sexual activity: Not Currently  Lifestyle  . Physical activity    Days per week: 7 days    Minutes per session: 30 min  . Stress: Not at all  Relationships  . Social connections    Talks on phone: More than three times a week    Gets together: Three times a week    Attends religious service: Never    Active member of club or organization: No    Attends meetings of clubs or organizations: Never    Relationship status: Married  Other Topics Concern  . Not on file  Social History Narrative   Walks 30 min per day. Retired 2 years ago.   Family History: Family History  Problem Relation Age of Onset  . Stroke Mother   . Cancer Mother        colon?  [name] Hypertension Father   . Hyperlipidemia Father    Allergies: Allergies  Allergen Reactions  . Penicillins Hives, Swelling, Rash and Other (See Comments)    PATIENT HAS HAD A PCN REACTION WITH IMMEDIATE RASH, FACIAL/TONGUE/THROAT SWELLING, SOB, OR LIGHTHEADEDNESS WITH HYPOTENSION:  #  #  YES  #  # Has patient had a  PCN reaction causing severe rash involving mucus membranes or skin necrosis: NO PATIENT HAS HAD A PCN REACTION THAT REQUIRED HOSPITALIZATION:  #  #  YES  #  #  Has patient had a PCN reaction occurring within the last 10 years: NO    . Red Dye Hives  . Sulfa Antibiotics Nausea And Vomiting and Hives  . Amitriptyline Other (See Comments)    Crazy dreams    . Macrodantin Nausea And Vomiting  . Nitrofurantoin Nausea And Vomiting   Medications: See med rec.  Review of Systems: No fevers, chills, night sweats, weight loss, chest pain, or shortness of breath.   Objective:    General: Well Developed, well nourished, and in no acute distress.  Neuro: Alert and oriented x3, extra-ocular muscles intact, sensation grossly intact.  HEENT: Normocephalic, atraumatic, pupils equal round reactive to light, neck  supple, no masses, no lymphadenopathy, thyroid nonpalpable.  Skin: Warm and dry, no rashes. Cardiac: Regular rate and rhythm, no murmurs rubs or gallops, no lower extremity edema.  Respiratory: Clear to auscultation bilaterally. Not using accessory muscles, speaking in full sentences. Left shoulder: Inspection reveals no abnormalities, atrophy or asymmetry. Palpation is normal with no tenderness over AC joint or bicipital groove. ROM is full in all planes. Rotator cuff strength weak to abduction Positive Neer and Hawkin's tests, empty can. Speeds and Yergason's tests mildly positive. No labral pathology noted with negative Obrien's, negative crank, negative clunk, and good stability. Normal scapular function observed. No painful arc and no drop arm sign. No apprehension sign  Impression and Recommendations:    Rotator cuff syndrome, left Had a subacromial injection sometime ago, only a couple of months of relief, diagnosed with frozen shoulder in urgent care, pain is over the deltoid and worse with overhead activities. She really does have good motion so I think this is more rotator cuff impingement syndrome/rotator cuff tear, she does have significant weakness to abduction, I think her motion is too good right now for this to be frozen shoulder. At this point we are going to switch to meloxicam, and get an MRI without contrast, follow-up with me to go over MRI results.   ___________________________________________ Gwen Her. Dianah Field, M.D., ABFM., CAQSM. Primary Care and Sports Medicine [facility]  Adjunct Professor of [place] [employer] [facility] of Medicine

## 2019-05-26 NOTE — Assessment & Plan Note (Signed)
Had a subacromial injection sometime ago, only a couple of months of relief, diagnosed with frozen shoulder in urgent care, pain is over the deltoid and worse with overhead activities. She really does have good motion so I think this is more rotator cuff impingement syndrome/rotator cuff tear, she does have significant weakness to abduction, I think her motion is too good right now for this to be frozen shoulder. At this point we are going to switch to meloxicam, and get an MRI without contrast, follow-up with me to go over MRI results.

## 2019-05-27 ENCOUNTER — Telehealth: Payer: Self-pay

## 2019-05-27 NOTE — Telephone Encounter (Signed)
Sorry Angelica [name]- meant to send to ordering provider  Note to Dr T

## 2019-05-27 NOTE — Telephone Encounter (Signed)
Patient scheduled for MRI on Sunday. She is claustrophobic. Needs pre-med.  Please advise me when this is called in so I can let pt know.

## 2019-05-28 MED ORDER — TRIAZOLAM 0.25 MG PO TABS: ORAL_TABLET | ORAL | 0 refills | Status: DC

## 2019-05-28 NOTE — Telephone Encounter (Signed)
Patient advised.

## 2019-05-28 NOTE — Telephone Encounter (Signed)
Sent, she needs a driver.

## 2019-05-30 ENCOUNTER — Ambulatory Visit (INDEPENDENT_AMBULATORY_CARE_PROVIDER_SITE_OTHER): Payer: BC Managed Care – PPO

## 2019-05-30 ENCOUNTER — Other Ambulatory Visit: Payer: Self-pay

## 2019-05-30 DIAGNOSIS — M25512 Pain in left shoulder: Secondary | ICD-10-CM | POA: Diagnosis not present

## 2019-05-30 DIAGNOSIS — M75102 Unspecified rotator cuff tear or rupture of left shoulder, not specified as traumatic: Secondary | ICD-10-CM | POA: Diagnosis not present

## 2019-05-31 ENCOUNTER — Ambulatory Visit (INDEPENDENT_AMBULATORY_CARE_PROVIDER_SITE_OTHER): Payer: BC Managed Care – PPO | Admitting: Sports Medicine

## 2019-05-31 ENCOUNTER — Encounter: Payer: Self-pay | Admitting: Sports Medicine

## 2019-05-31 DIAGNOSIS — M75102 Unspecified rotator cuff tear or rupture of left shoulder, not specified as traumatic: Secondary | ICD-10-CM

## 2019-05-31 NOTE — Assessment & Plan Note (Addendum)
Did not get much relief with a subacromial injection a couple of months ago. MRI shows rotator cuff tearing and glenohumeral osteoarthritis. Glenohumeral injection today, advanced rotator cuff home rehab, return in a month, referral for surgery if no better.

## 2019-05-31 NOTE — Progress Notes (Signed)
Subjective:    CC: Shoulder pain  HPI: Angeline returns, she did not get much relief after subacromial injection a couple of months ago, we obtained an MRI that showed rotator cuff tearing and glenohumeral osteoarthritis, pain is moderate, persistent, localized to the joint lines without radiation.  I reviewed the past medical history, family history, social history, surgical history, and allergies today and no changes were needed.  Please see the problem list section below in epic for further details.  Past Medical History: Past Medical History:  Diagnosis Date  . Allergy   . Anxiety   . Asthma   . Breast cancer ([place]) 2009  . Depression   . Dyspnea    with heavy pollen   . Hypertension   . Hypothyroidism   . Postmenopausal   . Thyroid disease    Past Surgical History: Past Surgical History:  Procedure Laterality Date  . ADENOIDECTOMY    . APPENDECTOMY    . BREAST RECONSTRUCTION  06/2008  . CHOLECYSTECTOMY N/A 11/12/2017   Procedure: LAPAROSCOPIC CHOLECYSTECTOMY WITH INTRAOPERATIVE CHOLANGIOGRAM ERAS PATHWAY;  Surgeon: Erroll Luna, MD;  Location: [place];  Service: General;  Laterality: N/A;  . MASTECTOMY  2009   BrCa  . TONSILLECTOMY     Social History: Social History   Socioeconomic History  . Marital status: Married    Spouse name: Sonia Side  . Number of children: 1  . Years of education: 15  . Highest education level: Some college, no degree  Occupational History  . 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 Financial resource strain: Not hard at all  . Food insecurity    Worry: Never true    Inability: Never true  . Transportation needs    Medical: No    Non-medical: No  Tobacco Use  . Smoking status: Former Smoker    Quit date: 06/25/2011    Years since quitting: 7.9  . Smokeless tobacco: Never Used  Substance and Sexual Activity  . Alcohol use: No  . Drug use: No  . Sexual activity: Not Currently  Lifestyle  .  Physical activity    Days per week: 7 days    Minutes per session: 30 min  . Stress: Not at all  Relationships  . Social connections    Talks on phone: More than three times a week    Gets together: Three times a week    Attends religious service: Never    Active member of club or organization: No    Attends meetings of clubs or organizations: Never    Relationship status: Married  Other Topics Concern  . Not on file  Social History Narrative   Walks 30 min per day. Retired 2 years ago.   Family History: Family History  Problem Relation Age of Onset  . Stroke Mother   . Cancer Mother        colon?  [name] Hypertension Father   . Hyperlipidemia Father    Allergies: Allergies  Allergen Reactions  . Penicillins Hives, Swelling, Rash and Other (See Comments)    PATIENT HAS HAD A PCN REACTION WITH IMMEDIATE RASH, FACIAL/TONGUE/THROAT SWELLING, SOB, OR LIGHTHEADEDNESS WITH HYPOTENSION:  #  #  YES  #  # Has patient had a PCN reaction causing severe rash involving mucus membranes or skin necrosis: NO PATIENT HAS HAD A PCN REACTION THAT REQUIRED HOSPITALIZATION:  #  #  YES  #  #  Has patient had a  PCN reaction occurring within the last 10 years: NO    . Red Dye Hives  . Sulfa Antibiotics Nausea And Vomiting and Hives  . Amitriptyline Other (See Comments)    Crazy dreams    . Macrodantin Nausea And Vomiting  . Nitrofurantoin Nausea And Vomiting   Medications: See med rec.  Review of Systems: No fevers, chills, night sweats, weight loss, chest pain, or shortness of breath.   Objective:    General: Well Developed, well nourished, and in no acute distress.  Neuro: Alert and oriented x3, extra-ocular muscles intact, sensation grossly intact.  HEENT: Normocephalic, atraumatic, pupils equal round reactive to light, neck supple, no masses, no lymphadenopathy, thyroid nonpalpable.  Skin: Warm and dry, no rashes. Cardiac: Regular rate and rhythm, no murmurs rubs or gallops, no lower  extremity edema.  Respiratory: Clear to auscultation bilaterally. Not using accessory muscles, speaking in full sentences.  Procedure: Real-time Ultrasound Guided injection of the left glenohumeral joint Device: Samsung HS60  Verbal informed consent obtained.  Time-out conducted.  Noted no overlying erythema, induration, or other signs of local infection.  Skin prepped in a sterile fashion.  Local anesthesia: Topical Ethyl chloride.  With sterile technique and under real time ultrasound guidance: 1 cc Kenalog 40, 2 cc lidocaine, 2 cc bupivacaine injected easily Completed without difficulty  Pain immediately resolved suggesting accurate placement of the medication.  Advised to call if fevers/chills, erythema, induration, drainage, or persistent bleeding.  Images permanently stored and available for review in the ultrasound unit.  Impression: Technically successful ultrasound guided injection.  Impression and Recommendations:    Rotator cuff syndrome, left Did not get much relief with a subacromial injection a couple of months ago. MRI shows rotator cuff tearing and glenohumeral osteoarthritis. Glenohumeral injection today, advanced rotator cuff home rehab, return in a month, referral for surgery if no better.   ___________________________________________ Gwen Her. Dianah Field, M.D., ABFM., CAQSM. Primary Care and Sports Medicine [facility]  Adjunct Professor of [place] [employer] [facility] of Medicine

## 2019-06-01 ENCOUNTER — Ambulatory Visit: Payer: Medicare Other | Admitting: Sports Medicine

## 2019-06-01 ENCOUNTER — Encounter: Payer: Self-pay | Admitting: Sports Medicine

## 2019-06-02 ENCOUNTER — Encounter: Payer: Self-pay | Admitting: Sports Medicine

## 2019-06-28 ENCOUNTER — Ambulatory Visit: Payer: Medicare Other | Admitting: Sports Medicine

## 2019-07-01 ENCOUNTER — Ambulatory Visit (INDEPENDENT_AMBULATORY_CARE_PROVIDER_SITE_OTHER): Payer: BC Managed Care – PPO | Admitting: Sports Medicine

## 2019-07-01 ENCOUNTER — Other Ambulatory Visit: Payer: Self-pay

## 2019-07-01 DIAGNOSIS — M75102 Unspecified rotator cuff tear or rupture of left shoulder, not specified as traumatic: Secondary | ICD-10-CM | POA: Diagnosis not present

## 2019-07-01 NOTE — Assessment & Plan Note (Signed)
Angelica [name] returns, she is doing extremely well, we started with subacromial injection that did not provide a whole lot of relief, subsequently an MRI showed glenohumeral osteoarthritis, and ultrasound-guided glenohumeral injection was performed and she returns pain-free. Return to see me as needed.

## 2019-07-01 NOTE — Progress Notes (Signed)
    Procedures performed today:    None.  Independent interpretation of tests performed by another provider:   None.  Impression and Recommendations:    Rotator cuff syndrome, left Shirle returns, she is doing extremely well, we started with subacromial injection that did not provide a whole lot of relief, subsequently an MRI showed glenohumeral osteoarthritis, and ultrasound-guided glenohumeral injection was performed and she returns pain-free. Return to see me as needed.    ___________________________________________ Gwen Her. Dianah Field, M.D., ABFM., CAQSM. Primary Care and [place] Instructor of [place] of [facility] of Medicine

## 2019-07-10 ENCOUNTER — Other Ambulatory Visit: Payer: Self-pay | Admitting: Physician Assistant

## 2019-07-15 ENCOUNTER — Encounter: Payer: Self-pay | Admitting: Physician Assistant

## 2019-08-07 DIAGNOSIS — Z23 Encounter for immunization: Secondary | ICD-10-CM | POA: Diagnosis not present

## 2019-08-23 ENCOUNTER — Other Ambulatory Visit: Payer: Self-pay | Admitting: Physician Assistant

## 2019-08-23 DIAGNOSIS — I10 Essential (primary) hypertension: Secondary | ICD-10-CM

## 2019-08-24 ENCOUNTER — Other Ambulatory Visit: Payer: Self-pay | Admitting: Sports Medicine

## 2019-08-24 DIAGNOSIS — M75102 Unspecified rotator cuff tear or rupture of left shoulder, not specified as traumatic: Secondary | ICD-10-CM

## 2019-08-24 MED ORDER — MELOXICAM 15 MG PO TABS: ORAL_TABLET | ORAL | 0 refills | Status: DC

## 2019-09-04 DIAGNOSIS — Z23 Encounter for immunization: Secondary | ICD-10-CM | POA: Diagnosis not present

## 2019-10-08 ENCOUNTER — Other Ambulatory Visit: Payer: Self-pay | Admitting: Neurology

## 2019-10-08 MED ORDER — ATORVASTATIN CALCIUM 40 MG PO TABS: 40.0000 mg | ORAL_TABLET | Freq: Every day | ORAL | 0 refills | Status: DC

## 2019-10-29 ENCOUNTER — Encounter: Payer: Self-pay | Admitting: Physician Assistant

## 2019-11-08 ENCOUNTER — Other Ambulatory Visit: Payer: Self-pay | Admitting: Neurology

## 2019-11-08 MED ORDER — ATORVASTATIN CALCIUM 40 MG PO TABS: 40.0000 mg | ORAL_TABLET | Freq: Every day | ORAL | 0 refills | Status: DC

## 2019-11-15 ENCOUNTER — Encounter: Payer: Self-pay | Admitting: Physician Assistant

## 2019-11-15 NOTE — Telephone Encounter (Signed)
Printed and given to Jen

## 2019-11-23 ENCOUNTER — Other Ambulatory Visit: Payer: Self-pay | Admitting: Sports Medicine

## 2019-11-23 DIAGNOSIS — M75102 Unspecified rotator cuff tear or rupture of left shoulder, not specified as traumatic: Secondary | ICD-10-CM

## 2019-11-23 MED ORDER — MELOXICAM 15 MG PO TABS: ORAL_TABLET | ORAL | 0 refills | Status: DC

## 2019-12-03 ENCOUNTER — Encounter: Payer: Self-pay | Admitting: Physician Assistant

## 2019-12-03 ENCOUNTER — Ambulatory Visit (INDEPENDENT_AMBULATORY_CARE_PROVIDER_SITE_OTHER): Payer: BC Managed Care – PPO | Admitting: Physician Assistant

## 2019-12-03 VITALS — BP 161/58 | HR 66 | Ht 63.0 in | Wt 164.0 lb

## 2019-12-03 DIAGNOSIS — F411 Generalized anxiety disorder: Secondary | ICD-10-CM

## 2019-12-03 DIAGNOSIS — F39 Unspecified mood [affective] disorder: Secondary | ICD-10-CM | POA: Diagnosis not present

## 2019-12-03 DIAGNOSIS — E785 Hyperlipidemia, unspecified: Secondary | ICD-10-CM | POA: Diagnosis not present

## 2019-12-03 DIAGNOSIS — F329 Major depressive disorder, single episode, unspecified: Secondary | ICD-10-CM

## 2019-12-03 DIAGNOSIS — E039 Hypothyroidism, unspecified: Secondary | ICD-10-CM

## 2019-12-03 DIAGNOSIS — F419 Anxiety disorder, unspecified: Secondary | ICD-10-CM

## 2019-12-03 DIAGNOSIS — F32A Depression, unspecified: Secondary | ICD-10-CM

## 2019-12-03 DIAGNOSIS — I10 Essential (primary) hypertension: Secondary | ICD-10-CM | POA: Diagnosis not present

## 2019-12-03 MED ORDER — HYDROCHLOROTHIAZIDE 12.5 MG PO TABS: 12.5000 mg | ORAL_TABLET | Freq: Every day | ORAL | 1 refills | Status: DC

## 2019-12-03 MED ORDER — BUSPIRONE HCL 10 MG PO TABS: ORAL_TABLET | ORAL | 1 refills | Status: DC

## 2019-12-03 MED ORDER — LAMOTRIGINE 25 MG PO TABS: 25.0000 mg | ORAL_TABLET | Freq: Every day | ORAL | 1 refills | Status: DC

## 2019-12-03 NOTE — Progress Notes (Signed)
Subjective:    Patient ID: Angelica [name], female    DOB: 05/29/1951, 69 y.o.   MRN: [id number]  HPI  Patient is a 69 year old female with hypertension, asthma, anxiety, episodic mood disorder, insomnia who presents to the clinic for medication management.  Patient has been taking her lisinopril daily.  She is not checking her blood pressures.  She denies any headaches, chest pain, palpitations, vision changes or dizziness.  Her anxiety has not been well controlled.  She is taking Lexapro and BuSpar.  She is only using the Klonopin when she absolutely needs it.  She is not using it daily.  She does use temazepam as needed for sleep as well.  She feels like she is always anxious and her mind rolling.  When she is seated she is always bouncing and cannot sit still.  She does have a lot of responsibility.  Her daughter and 2 grandchildren live with her.  She is also taking care of her elderly father.  She denies any suicidal thoughts or homicidal idealizations.  Patient does take her thyroid medication daily.  Unaware of any concerns or complaints.  .. Active Ambulatory Problems    Diagnosis Date Noted   Breast cancer ([facility]) 01/31/2011   GAD (generalized anxiety disorder) 01/31/2011   Asthma 01/31/2011   Seasonal allergies 01/31/2011   Hypothyroid 01/31/2011   Episodic mood disorder ([place]) 10/17/2012   Anxiety and depression 08/03/2013   Chondromalacia of right patellofemoral joint 02/10/2014   Insomnia 03/15/2014   IBS (irritable bowel syndrome) 08/22/2014   OAB (overactive bladder) 03/08/2015   Vitamin D deficiency 03/09/2015   Dyslipidemia (high LDL; low HDL) 03/09/2015   Elevated blood pressure 04/26/2015   Trapezius muscle spasm 09/28/2015   Epistaxis 11/14/2015   Essential hypertension 06/11/2016   Cellulitis and abscess of buttock 06/04/2017   Calculus of gallbladder 10/21/2017   Elevated lipase 10/21/2017   Diarrhea 10/21/2017   Upper abdominal pain  10/21/2017   Left-sided nosebleed 03/02/2018   TMJ pain dysfunction syndrome 03/02/2018   Colon polyps 03/31/2018   Neck fullness 08/31/2018   Rotator cuff syndrome, left 05/26/2019   Resolved Ambulatory Problems    Diagnosis Date Noted   Impingement syndrome of left shoulder 08/28/2015   Acute upper respiratory infection 12/15/2015   Past Medical History:  Diagnosis Date   Allergy    Anxiety    Asthma    Depression    Dyspnea    Hypertension    Hypothyroidism    Postmenopausal    Thyroid disease      Review of Systems  All other systems reviewed and are negative.      Objective:   Physical Exam Vitals reviewed.  Constitutional:      Appearance: Normal appearance.  Neck:     Vascular: No carotid bruit.  Cardiovascular:     Rate and Rhythm: Normal rate and regular rhythm.     Pulses: Normal pulses.  Pulmonary:     Effort: Pulmonary effort is normal.     Breath sounds: Normal breath sounds.  Neurological:     General: No focal deficit present.     Mental Status: She is alert and oriented to person, place, and time.  Psychiatric:        Mood and Affect: Mood normal.    .. Depression screen [facility] 2/9 12/03/2019 03/08/2019 03/02/2019 12/21/2018 08/31/2018  Decreased Interest 1 0 1 0 0  Down, Depressed, Hopeless 1 2 1  0 0  PHQ - 2 Score 2  2 2 0 0  Altered sleeping 3 2 1  - 0  Tired, decreased energy 3 2 1  - 0  Change in appetite 3 2 1  - 0  Feeling bad or failure about yourself  3 2 1  - 0  Trouble concentrating 3 3 1  - 0  Moving slowly or fidgety/restless 3 0 1 - 0  Suicidal thoughts 0 0 0 - 0  PHQ-9 Score 20 13 8  - 0  Difficult doing work/chores - Not difficult at all Somewhat difficult - Not difficult at all   .[name] GAD 7 : Generalized Anxiety Score 12/03/2019 03/08/2019 03/02/2019 08/31/2018  Nervous, Anxious, on Edge 3 2 1 1   Control/stop worrying 3 2 2 1   Worry too much - different things 3 3 2 1   Trouble relaxing 3 3 2 1   Restless 3 3 2 1   Easily  annoyed or irritable 3 3 2 1   Afraid - awful might happen 3 3 2 1   Total GAD 7 Score 21 19 13 7   Anxiety Difficulty Very difficult Not difficult at all Somewhat difficult Somewhat difficult           Assessment & Plan:  Marland KitchenMarland KitchenMekesha was seen today for anxiety.  Diagnoses and all orders for this visit:  Essential hypertension -     COMPLETE METABOLIC PANEL WITH GFR -     CBC -     hydrochlorothiazide (HYDRODIURIL) 12.5 MG tablet; Take 1 tablet (12.5 mg total) by mouth daily.  Dyslipidemia (high LDL; low HDL) -     Lipid Panel w/reflex Direct LDL  Anxiety and depression -     CBC -     busPIRone (BUSPAR) 10 MG tablet; TAKE 1 TABLET(10 MG) BY MOUTH THREE TIMES DAILY  GAD (generalized anxiety disorder) -     TSH -     busPIRone (BUSPAR) 10 MG tablet; TAKE 1 TABLET(10 MG) BY MOUTH THREE TIMES DAILY  Acquired hypothyroidism -     TSH  Mood disorder (H[facility]) -     lamoTRIgine (LAMICTAL) 25 MG tablet; Take 1 tablet (25 mg total) by mouth daily.   GAD score is elevated today.  I am concerned that some of this is her mood disorder exacerbating.  I would like for her to stay on Lexapro and BuSpar as well as add Lamictal.  We will recheck in 4 weeks.  Continue to use Klonopin only as needed.  Discussed dependency risk and side effects with patient.  Her blood pressure is elevated today.  On second recheck it was even more elevated.  We will add HCTZ to lisinopril.  We will recheck blood pressure in 4 weeks.  Patient is asymptomatic today.  We will adjust thyroid medicine as needed after thyroid recheck. We will check lipid, CMP, thyroid, CBC for medicine management and symptoms today.

## 2019-12-04 LAB — COMPLETE METABOLIC PANEL WITH GFR
AG Ratio: 2 (calc) (ref 1.0–2.5)
ALT: 18 U/L (ref 6–29)
AST: 18 U/L (ref 10–35)
Albumin: 4.3 g/dL (ref 3.6–5.1)
Alkaline phosphatase (APISO): 107 U/L (ref 37–153)
BUN: 12 mg/dL (ref 7–25)
CO2: 29 mmol/L (ref 20–32)
Calcium: 9.2 mg/dL (ref 8.6–10.4)
Chloride: 107 mmol/L (ref 98–110)
Creat: 0.65 mg/dL (ref 0.50–0.99)
GFR, Est African American: 105 mL/min/{1.73_m2} (ref >=60)
GFR, Est Non African American: 91 mL/min/{1.73_m2} (ref >=60)
Globulin: 2.2 g/dL (calc) (ref 1.9–3.7)
Glucose, Bld: 99 mg/dL (ref 65–139)
Potassium: 3.8 mmol/L (ref 3.5–5.3)
Sodium: 142 mmol/L (ref 135–146)
Total Bilirubin: 0.9 mg/dL (ref 0.2–1.2)
Total Protein: 6.5 g/dL (ref 6.1–8.1)

## 2019-12-04 LAB — CBC
HCT: 39.8 % (ref 35.0–45.0)
Hemoglobin: 13.7 g/dL (ref 11.7–15.5)
MCH: 29.3 pg (ref 27.0–33.0)
MCHC: 34.4 g/dL (ref 32.0–36.0)
MCV: 85 fL (ref 80.0–100.0)
MPV: 12 fL (ref 7.5–12.5)
Platelets: 196 10*3/uL (ref 140–400)
RBC: 4.68 10*6/uL (ref 3.80–5.10)
RDW: 13.4 % (ref 11.0–15.0)
WBC: 7.3 10*3/uL (ref 3.8–10.8)

## 2019-12-04 LAB — LIPID PANEL W/REFLEX DIRECT LDL
Cholesterol: 165 mg/dL (ref <200)
HDL: 44 mg/dL — ABNORMAL LOW (ref >=50)
LDL Cholesterol (Calc): 102 mg/dL (calc) — ABNORMAL HIGH
Non-HDL Cholesterol (Calc): 121 mg/dL (calc) (ref <130)
Total CHOL/HDL Ratio: 3.8 (calc) (ref <5.0)
Triglycerides: 99 mg/dL (ref <150)

## 2019-12-04 LAB — TSH: TSH: 2.22 mIU/L (ref 0.40–4.50)

## 2019-12-06 ENCOUNTER — Telehealth: Payer: Self-pay | Admitting: Neurology

## 2019-12-06 ENCOUNTER — Emergency Department (HOSPITAL_COMMUNITY): Payer: BC Managed Care – PPO

## 2019-12-06 ENCOUNTER — Other Ambulatory Visit: Payer: Self-pay

## 2019-12-06 ENCOUNTER — Other Ambulatory Visit: Payer: Self-pay | Admitting: Physician Assistant

## 2019-12-06 ENCOUNTER — Emergency Department (HOSPITAL_COMMUNITY)
Admission: EM | Admit: 2019-12-06 | Discharge: 2019-12-06 | Disposition: A | Discharge status: Home / Self Care | Payer: BC Managed Care – PPO | Attending: Emergency Medicine | Admitting: Emergency Medicine

## 2019-12-06 DIAGNOSIS — S0003XA Contusion of scalp, initial encounter: Secondary | ICD-10-CM | POA: Diagnosis not present

## 2019-12-06 DIAGNOSIS — R52 Pain, unspecified: Secondary | ICD-10-CM | POA: Diagnosis not present

## 2019-12-06 DIAGNOSIS — Z853 Personal history of malignant neoplasm of breast: Secondary | ICD-10-CM | POA: Insufficient documentation

## 2019-12-06 DIAGNOSIS — Y999 Unspecified external cause status: Secondary | ICD-10-CM | POA: Diagnosis not present

## 2019-12-06 DIAGNOSIS — S0990XA Unspecified injury of head, initial encounter: Secondary | ICD-10-CM | POA: Diagnosis not present

## 2019-12-06 DIAGNOSIS — R404 Transient alteration of awareness: Secondary | ICD-10-CM | POA: Diagnosis not present

## 2019-12-06 DIAGNOSIS — Z87891 Personal history of nicotine dependence: Secondary | ICD-10-CM | POA: Diagnosis not present

## 2019-12-06 DIAGNOSIS — E039 Hypothyroidism, unspecified: Secondary | ICD-10-CM | POA: Insufficient documentation

## 2019-12-06 DIAGNOSIS — S199XXA Unspecified injury of neck, initial encounter: Secondary | ICD-10-CM | POA: Diagnosis not present

## 2019-12-06 DIAGNOSIS — G4489 Other headache syndrome: Secondary | ICD-10-CM | POA: Diagnosis not present

## 2019-12-06 DIAGNOSIS — Y939 Activity, unspecified: Secondary | ICD-10-CM | POA: Diagnosis not present

## 2019-12-06 DIAGNOSIS — Y92010 Kitchen of single-family (private) house as the place of occurrence of the external cause: Secondary | ICD-10-CM | POA: Diagnosis not present

## 2019-12-06 DIAGNOSIS — I10 Essential (primary) hypertension: Secondary | ICD-10-CM | POA: Insufficient documentation

## 2019-12-06 DIAGNOSIS — J984 Other disorders of lung: Secondary | ICD-10-CM | POA: Diagnosis not present

## 2019-12-06 DIAGNOSIS — W1830XA Fall on same level, unspecified, initial encounter: Secondary | ICD-10-CM | POA: Diagnosis not present

## 2019-12-06 DIAGNOSIS — Z79899 Other long term (current) drug therapy: Secondary | ICD-10-CM | POA: Insufficient documentation

## 2019-12-06 DIAGNOSIS — R55 Syncope and collapse: Secondary | ICD-10-CM

## 2019-12-06 LAB — CBC WITH DIFFERENTIAL/PLATELET
Abs Immature Granulocytes: 0.02 10*3/uL (ref 0.00–0.07)
Basophils Absolute: 0.1 10*3/uL (ref 0.0–0.1)
Basophils Relative: 1 %
Eosinophils Absolute: 0.2 10*3/uL (ref 0.0–0.5)
Eosinophils Relative: 3 %
HCT: 40.1 % (ref 36.0–46.0)
Hemoglobin: 13.5 g/dL (ref 12.0–15.0)
Immature Granulocytes: 0 %
Lymphocytes Relative: 44 %
Lymphs Abs: 3.7 10*3/uL (ref 0.7–4.0)
MCH: 28.9 pg (ref 26.0–34.0)
MCHC: 33.7 g/dL (ref 30.0–36.0)
MCV: 85.9 fL (ref 80.0–100.0)
Monocytes Absolute: 0.6 10*3/uL (ref 0.1–1.0)
Monocytes Relative: 7 %
Neutro Abs: 3.7 10*3/uL (ref 1.7–7.7)
Neutrophils Relative %: 45 %
Platelets: 191 10*3/uL (ref 150–400)
RBC: 4.67 MIL/uL (ref 3.87–5.11)
RDW: 13.2 % (ref 11.5–15.5)
WBC: 8.3 10*3/uL (ref 4.0–10.5)
nRBC: 0 % (ref 0.0–0.2)

## 2019-12-06 LAB — BASIC METABOLIC PANEL
Anion gap: 9 (ref 5–15)
BUN: 16 mg/dL (ref 8–23)
CO2: 27 mmol/L (ref 22–32)
Calcium: 9.3 mg/dL (ref 8.9–10.3)
Chloride: 106 mmol/L (ref 98–111)
Creatinine, Ser: 0.76 mg/dL (ref 0.44–1.00)
GFR calc Af Amer: >60 mL/min (ref >60)
GFR calc non Af Amer: >60 mL/min (ref >60)
Glucose, Bld: 104 mg/dL — ABNORMAL HIGH (ref 70–99)
Potassium: 3.2 mmol/L — ABNORMAL LOW (ref 3.5–5.1)
Sodium: 142 mmol/L (ref 135–145)

## 2019-12-06 LAB — TROPONIN I (HIGH SENSITIVITY)
Troponin I (High Sensitivity): <2 ng/L (ref <18)
Troponin I (High Sensitivity): 3 ng/L (ref <18)

## 2019-12-06 LAB — CBG MONITORING, ED: Glucose-Capillary: 118 mg/dL — ABNORMAL HIGH (ref 70–99)

## 2019-12-06 MED ORDER — ATORVASTATIN CALCIUM 40 MG PO TABS: 40.0000 mg | ORAL_TABLET | Freq: Every day | ORAL | 3 refills | Status: DC

## 2019-12-06 NOTE — ED Triage Notes (Signed)
Pt arrives via [facility] c/o syncopal episode at home while washing dishes. Pt reports feeling like "I'm in a fog" and fell backwards hitting head. Pt then remembers being awoken by EMS. Not on blood thinners. Pt reports new BP medicine started Friday and has "felt like I'm in a cloud" since. New medicines include lamotrigine 25 mg and escitalopram 20 mg. Pt reports CP that "feels like a panic attack" and pain to back of her head.

## 2019-12-06 NOTE — Telephone Encounter (Signed)
Ok I would at least take HCTZ for BP and then keep regular follow up. If no side effects and tolerating she can then add lamictal back in too.

## 2019-12-06 NOTE — Telephone Encounter (Signed)
Spoke with patient. She states she has already restarted both medications with no trouble. She will keep an eye on her blood pressure at home, keep follow up, and call with any issues.

## 2019-12-06 NOTE — Discharge Instructions (Signed)
Your work-up today was reassuring.  We feel that your syncope was likely related to initiation of new medications.  I would encourage you to discuss this with your primary care doctor today, may need to have these adjusted. Please return here for any new or acute changes--recurrent syncope, confusion, focal numbness/weakness, difficulty walking, etc.

## 2019-12-06 NOTE — Telephone Encounter (Signed)
Donella Stade, PA-C sent to Angelica [name], CMA Can we call and see how she is doing. I would like to see her this week to check on BP and medications since concern one of these medications caused the passing out.   -[name]

## 2019-12-06 NOTE — ED Provider Notes (Signed)
[facility] EMERGENCY DEPARTMENT Provider Note   CSN: [id number] Arrival date & time: 12/06/19  0040     History Chief Complaint  Patient presents with  . Loss of Consciousness    Angelica [name] is a 69 y.o. female.  The history is provided by the patient and medical records.  Loss of Consciousness   69 year old female with history of seasonal allergies, anxiety, asthma, depression, hypertension, hypothyroidism, presenting to the ED after a syncopal event.  Patient reports she was standing at the kitchen sink doing dishes when all of a sudden she got lightheaded and passed out.  She struck her head on the concrete floor.  States she woke up in the ambulance on the way here.  States she was seen by her PCP on Friday and started on any new blood pressure medication (HCTZ) and was started on lamictal.  States since this time she has felt like she is "in a fog".  She does report she has been feeling lightheaded, especially with standing.  She has been eating and drinking well.  She does report feeling more sleepy than normal.  She does report chest pain currently, states she feels like it is due to her anxiety.  She denies and prior cardiac history. She is not currently on anticoagulation.    Past Medical History:  Diagnosis Date  . Allergy   . Anxiety   . Asthma   . Breast cancer ([place]) 2009  . Depression   . Dyspnea    with heavy pollen   . Hypertension   . Hypothyroidism   . Postmenopausal   . Thyroid disease     Patient Active Problem List   Diagnosis Date Noted  . Rotator cuff syndrome, left 05/26/2019  . Neck fullness 08/31/2018  . Colon polyps 03/31/2018  . Left-sided nosebleed 03/02/2018  . TMJ pain dysfunction syndrome 03/02/2018  . Calculus of gallbladder 10/21/2017  . Elevated lipase 10/21/2017  . Diarrhea 10/21/2017  . Upper abdominal pain 10/21/2017  . Cellulitis and abscess of buttock 06/04/2017  . Essential hypertension 06/11/2016  .  Epistaxis 11/14/2015  . Trapezius muscle spasm 09/28/2015  . Elevated blood pressure 04/26/2015  . Vitamin D deficiency 03/09/2015  . Dyslipidemia (high LDL; low HDL) 03/09/2015  . OAB (overactive bladder) 03/08/2015  . IBS (irritable bowel syndrome) 08/22/2014  . Insomnia 03/15/2014  . Chondromalacia of right patellofemoral joint 02/10/2014  . Anxiety and depression 08/03/2013  . Episodic mood disorder ([place]) 10/17/2012  . Breast cancer ([place]) 01/31/2011  . GAD (generalized anxiety disorder) 01/31/2011  . Asthma 01/31/2011  . Seasonal allergies 01/31/2011  . Hypothyroid 01/31/2011    Past Surgical History:  Procedure Laterality Date  . ADENOIDECTOMY    . APPENDECTOMY    . BREAST RECONSTRUCTION  06/2008  . CHOLECYSTECTOMY N/A 11/12/2017   Procedure: LAPAROSCOPIC CHOLECYSTECTOMY WITH INTRAOPERATIVE CHOLANGIOGRAM ERAS PATHWAY;  Surgeon: Erroll Luna, MD;  Location: [facility];  Service: General;  Laterality: N/A;  . MASTECTOMY  2009   [facility]  . TONSILLECTOMY       OB History   No obstetric history on file.     Family History  Problem Relation Age of Onset  . Stroke Mother   . Cancer Mother        colon?  [name] Hypertension Father   . Hyperlipidemia Father     Social History   Tobacco Use  . Smoking status: Former Smoker    Quit date: 06/25/2011    Years since quitting:  8.4  . Smokeless tobacco: Never Used  Vaping Use  . Vaping Use: Never used  Substance Use Topics  . Alcohol use: No  . Drug use: No    Home Medications Prior to Admission medications   Medication Sig Start Date End Date Taking? Authorizing Provider  albuterol (PROAIR HFA) 108 (90 Base) MCG/ACT inhaler Inhale 2-4 puffs into the lungs every 6 (six) hours as needed for wheezing or shortness of breath. 03/08/19   Breeback, [name] L, PA-C  atorvastatin (LIPITOR) 40 MG tablet Take 1 tablet (40 mg total) by mouth daily at 6 PM. LABS FOR FURTHER REFILLS 11/08/19   Breeback, [name] L, PA-C  busPIRone (BUSPAR) 10 MG  tablet TAKE 1 TABLET(10 MG) BY MOUTH THREE TIMES DAILY 12/03/19   Breeback, [name] L, PA-C  clonazePAM (KLONOPIN) 0.5 MG tablet Take 1 tablet (0.5 mg total) by mouth 2 (two) times daily as needed. for anxiety 03/08/19   Iran Planas L, PA-C  dicyclomine (BENTYL) 10 MG capsule Take 1 capsule (10 mg total) by mouth 2 (two) times daily. 03/08/19   Breeback, [name] L, PA-C  escitalopram (LEXAPRO) 20 MG tablet Take 1 tablet (20 mg total) by mouth daily. 03/08/19   Breeback, [name] L, PA-C  hydrochlorothiazide (HYDRODIURIL) 12.5 MG tablet Take 1 tablet (12.5 mg total) by mouth daily. 12/03/19   Breeback, Royetta Car, PA-C  lamoTRIgine (LAMICTAL) 25 MG tablet Take 1 tablet (25 mg total) by mouth daily. 12/03/19   Donella Stade, PA-C  levothyroxine (SYNTHROID) 75 MCG tablet Take 1 tablet (75 mcg total) by mouth daily before breakfast. 03/09/19   Breeback, [name] L, PA-C  lisinopril (ZESTRIL) 40 MG tablet Take 1 tablet (40 mg total) by mouth daily. 03/08/19   Breeback, Royetta Car, PA-C  meloxicam (MOBIC) 15 MG tablet One tab PO qAM with a meal for 2 weeks, then daily prn pain. 11/23/19   Silverio Decamp, MD  temazepam (RESTORIL) 30 MG capsule Take 1 capsule (30 mg total) by mouth at bedtime as needed for sleep. 03/08/19   Breeback, [name] L, PA-C    Allergies    Penicillins, Red dye, Sulfa antibiotics, Amitriptyline, Macrodantin, and Nitrofurantoin  Review of Systems   Review of Systems  Cardiovascular: Positive for syncope.  Neurological: Positive for syncope.  All other systems reviewed and are negative.   Physical Exam Updated Vital Signs BP (!) 128/46 (BP Location: Left Arm)   Pulse 68   Temp 98.2 F (36.8 C) (Oral)   Resp 14   Ht '5\' 3"'$  (1.6 m)   Wt 73.5 kg   LMP 06/24/2000   SpO2 98%   BMI 28.70 kg/m   Physical Exam Vitals and nursing note reviewed.  Constitutional:      Appearance: She is well-developed.  HENT:     Head: Normocephalic and atraumatic.     Comments: Contusion right occiput, no  laceration or open wound noted, no bleeding, area is locally tender to touch Eyes:     Conjunctiva/sclera: Conjunctivae normal.     Pupils: Pupils are equal, round, and reactive to light.  Cardiovascular:     Rate and Rhythm: Normal rate and regular rhythm.     Heart sounds: Normal heart sounds.  Pulmonary:     Effort: Pulmonary effort is normal.     Breath sounds: Normal breath sounds.  Abdominal:     General: Bowel sounds are normal.     Palpations: Abdomen is soft.  Musculoskeletal:        General:  Normal range of motion.     Cervical back: Normal range of motion.  Skin:    General: Skin is warm and dry.  Neurological:     Mental Status: She is alert and oriented to person, place, and time.     Comments: AAOx3, answering questions and following commands appropriately; equal strength UE and LE bilaterally; CN grossly intact; moves all extremities appropriately without ataxia; no focal neuro deficits or facial asymmetry appreciated     ED Results / Procedures / Treatments   Labs (all labs ordered are listed, but only abnormal results are displayed) Labs Reviewed  BASIC METABOLIC PANEL - Abnormal; Notable for the following components:      Result Value   Potassium 3.2 (*)    Glucose, Bld 104 (*)    All other components within normal limits  CBG MONITORING, ED - Abnormal; Notable for the following components:   Glucose-Capillary 118 (*)    All other components within normal limits  CBC WITH DIFFERENTIAL/PLATELET  TROPONIN I (HIGH SENSITIVITY)  TROPONIN I (HIGH SENSITIVITY)    EKG None  Radiology DG Chest 2 View  Result Date: 12/06/2019 CLINICAL DATA:  Syncope. EXAM: CHEST - 2 VIEW COMPARISON:  10/13/2017 FINDINGS: The heart size and mediastinal contours are within normal limits. Both lungs are clear. The visualized skeletal structures are unremarkable. IMPRESSION: No active cardiopulmonary disease. Electronically Signed   By: Constance Holster M.D.   On: 12/06/2019  01:53   CT Head Wo Contrast  Result Date: 12/06/2019 CLINICAL DATA:  Trauma EXAM: CT HEAD WITHOUT CONTRAST CT CERVICAL SPINE WITHOUT CONTRAST TECHNIQUE: Multidetector CT imaging of the head and cervical spine was performed following the standard protocol without intravenous contrast. Multiplanar CT image reconstructions of the cervical spine were also generated. COMPARISON:  None. FINDINGS: CT HEAD FINDINGS Brain: There is no mass, hemorrhage or extra-axial collection. The size and configuration of the ventricles and extra-axial CSF spaces are normal. The brain parenchyma is normal, without evidence of acute or chronic infarction. Vascular: No abnormal hyperdensity of the major intracranial arteries or dural venous sinuses. No intracranial atherosclerosis. Skull: The visualized skull base, calvarium and extracranial soft tissues are normal. Sinuses/Orbits: No fluid levels or advanced mucosal thickening of the visualized paranasal sinuses. No mastoid or middle ear effusion. The orbits are normal. CT CERVICAL SPINE FINDINGS Alignment: No static subluxation. Facets are aligned. Occipital condyles are normally positioned. Skull base and vertebrae: No acute fracture. Soft tissues and spinal canal: No prevertebral fluid or swelling. No visible canal hematoma. Disc levels: Multilevel facet arthrosis. Upper chest: No pneumothorax, pulmonary nodule or pleural effusion. Other: Normal visualized paraspinal cervical soft tissues. IMPRESSION: 1. No acute intracranial abnormality. 2. No acute fracture or static subluxation of the cervical spine. Electronically Signed   By: Ulyses Jarred M.D.   On: 12/06/2019 01:59   CT Cervical Spine Wo Contrast  Result Date: 12/06/2019 CLINICAL DATA:  Trauma EXAM: CT HEAD WITHOUT CONTRAST CT CERVICAL SPINE WITHOUT CONTRAST TECHNIQUE: Multidetector CT imaging of the head and cervical spine was performed following the standard protocol without intravenous contrast. Multiplanar CT image  reconstructions of the cervical spine were also generated. COMPARISON:  None. FINDINGS: CT HEAD FINDINGS Brain: There is no mass, hemorrhage or extra-axial collection. The size and configuration of the ventricles and extra-axial CSF spaces are normal. The brain parenchyma is normal, without evidence of acute or chronic infarction. Vascular: No abnormal hyperdensity of the major intracranial arteries or dural venous sinuses. No intracranial atherosclerosis. Skull: The  visualized skull base, calvarium and extracranial soft tissues are normal. Sinuses/Orbits: No fluid levels or advanced mucosal thickening of the visualized paranasal sinuses. No mastoid or middle ear effusion. The orbits are normal. CT CERVICAL SPINE FINDINGS Alignment: No static subluxation. Facets are aligned. Occipital condyles are normally positioned. Skull base and vertebrae: No acute fracture. Soft tissues and spinal canal: No prevertebral fluid or swelling. No visible canal hematoma. Disc levels: Multilevel facet arthrosis. Upper chest: No pneumothorax, pulmonary nodule or pleural effusion. Other: Normal visualized paraspinal cervical soft tissues. IMPRESSION: 1. No acute intracranial abnormality. 2. No acute fracture or static subluxation of the cervical spine. Electronically Signed   By: Ulyses Jarred M.D.   On: 12/06/2019 01:59    Procedures Procedures (including critical care time)  Medications Ordered in ED Medications - No data to display  ED Course  I have reviewed the triage vital signs and the nursing notes.  Pertinent labs & imaging results that were available during my care of the patient were reviewed by me and considered in my medical decision making (see chart for details).    MDM Rules/Calculators/A&P  69 year old female presenting to the ED after a syncopal episode.  She was standing at the sink washing dishes when she passed out.  [address] [address] backward struck her head on concrete floor.  She was unconscious for a few  minutes, woke up in the ambulance on the way to the ED.  On arrival she is awake, alert, appropriately oriented.  She does report pain along her right occiput and into her neck.  She does not have any open wounds or lacerations.  She also reports chest pain, states it feels like a "heaviness".  She denies any known cardiac history.  She does have anxiety and feels like that may be contributing.  Of note, patient was started on HCTZ and Lamictal 2 days ago with PCP.  States she has felt "foggy" since this time.  She does report some lightheadedness with standing.  Suspect this may have contributed to her event today.  Labs are pending along with CT of the head, neck, and chest x-ray.  Will reassess.  Nonischemic.  Labs are reassuring including troponin x2.  CT of head and neck negative for any acute findings.  Chest x-ray is clear.  Orthostatics were checked here and are reassuring.  Patient is feeling better.  Remains without any focal neurologic deficits.  Continue to suspect that syncope may be related to initiation of new medications.  I recommended that she discuss this with her primary care doctor this morning as may need to have adjustments.  She may return here for any new or acute changes.  Shared visit with attending physician, Dr. Betsey Holiday, who evaluated patient and agrees with work-up and plan of care.  Final Clinical Impression(s) / ED Diagnoses Final diagnoses:  Syncope, unspecified syncope type    Rx / DC Orders ED Discharge Orders    None       Larene Pickett, PA-C 12/06/19 0403    Orpah Greek, MD 12/06/19 [phone]

## 2019-12-06 NOTE — Telephone Encounter (Signed)
Called patient to check on her and she states she is doing fine. She said she figured out what happened. She had taken her temazepam for sleep, but then got caught up doing dishes, cleaning, etc. And did not go to bed. She is pretty sure this is what caused the episode. She doesn't think she needs seen this week. Please let me know if you want me to schedule her anyway.

## 2019-12-06 NOTE — Progress Notes (Signed)
Angelica [name],   Cholesterol is stable. HDL improved which is great news Kidney, liver, glucose look great.  Thyroid looks great.

## 2019-12-22 ENCOUNTER — Ambulatory Visit (INDEPENDENT_AMBULATORY_CARE_PROVIDER_SITE_OTHER): Payer: BC Managed Care – PPO | Admitting: Medical-Surgical

## 2019-12-22 ENCOUNTER — Ambulatory Visit: Payer: BC Managed Care – PPO

## 2019-12-22 ENCOUNTER — Other Ambulatory Visit: Payer: Self-pay

## 2019-12-22 ENCOUNTER — Encounter: Payer: Self-pay | Admitting: Medical-Surgical

## 2019-12-22 VITALS — BP 132/82 | HR 71 | Temp 98.2°F | Ht 64.0 in | Wt 164.6 lb

## 2019-12-22 DIAGNOSIS — Z0000 Encounter for general adult medical examination without abnormal findings: Secondary | ICD-10-CM

## 2019-12-22 NOTE — Progress Notes (Addendum)
Subjective:   Angelica [name] is a 69 y.o. female who presents for Medicare Annual (Subsequent) preventive examination.  Review of Systems    No fevers/chills, recent illnesses, chest pain, shortness of breath, lower extremity edema, or GI symptoms.   No concerns today.  Recent fall with subsequent evaluation at the ED. Notes she had taken her Temazepam but decided to try to get the dishes done before going to bed. Unfortunately, the medication kicked in and she fell asleep standing at the sink. She fell and hit her head on the stove. EMS called to the scene. No concerning findings, okay for outpatient follow up.     Objective:    Today's Vitals   12/22/19 1000 12/22/19 1039  BP: 132/82   Pulse: 71   Temp: 98.2 F (36.8 C)   TempSrc: Oral   SpO2: 98%   Weight: 164 lb 9.6 oz (74.7 kg)   Height: _0  (1.626 m)   PainSc:  0-No pain   Body mass index is 28.25 kg/m.  Advanced Directives 12/22/2019 12/06/2019 12/21/2018 11/12/2017 11/06/2017 10/13/2017 11/09/2013  Does Patient Have a Medical Advance Directive? Yes _1  Patient does not have advance directive;Patient would not like information  Type of Scientist, forensic Power of [place];Living will - - - - - -  Does patient want to make changes to medical advance directive? No - Patient declined - - - - - -  Copy of [place] in Chart? No - copy requested - - - - - -  Would patient like information on creating a medical advance directive? - - Yes (MAU/Ambulatory/Procedural Areas - Information given) No - Patient declined No - Patient declined No - Patient declined -    Current Medications (verified) Outpatient Encounter Medications as of 12/22/2019  Medication Sig  . albuterol (PROAIR HFA) 108 (90 Base) MCG/ACT inhaler Inhale 2-4 puffs into the lungs every 6 (six) hours as needed for wheezing or shortness of breath.  [name] atorvastatin (LIPITOR) 40 MG tablet Take 1 tablet (40 mg total) by mouth daily  at 6 PM.  . busPIRone (BUSPAR) 10 MG tablet TAKE 1 TABLET(10 MG) BY MOUTH THREE TIMES DAILY  . clonazePAM (KLONOPIN) 0.5 MG tablet Take 1 tablet (0.5 mg total) by mouth 2 (two) times daily as needed. for anxiety  . dicyclomine (BENTYL) 10 MG capsule Take 1 capsule (10 mg total) by mouth 2 (two) times daily.  [name] escitalopram (LEXAPRO) 20 MG tablet Take 1 tablet (20 mg total) by mouth daily.  . hydrochlorothiazide (HYDRODIURIL) 12.5 MG tablet Take 1 tablet (12.5 mg total) by mouth daily.  [name] lamoTRIgine (LAMICTAL) 25 MG tablet Take 1 tablet (25 mg total) by mouth daily.  [name] levothyroxine (SYNTHROID) 75 MCG tablet Take 1 tablet (75 mcg total) by mouth daily before breakfast.  . lisinopril (ZESTRIL) 40 MG tablet Take 1 tablet (40 mg total) by mouth daily.  . meloxicam (MOBIC) 15 MG tablet One tab PO qAM with a meal for 2 weeks, then daily prn pain.  [name] temazepam (RESTORIL) 30 MG capsule Take 1 capsule (30 mg total) by mouth at bedtime as needed for sleep.   Facility-Administered Encounter Medications as of 12/22/2019  Medication  . ipratropium-albuterol (DUONEB) 0.5-2.5 (3) MG/3ML nebulizer solution 3 mL    Allergies (verified) Penicillins, Red dye, Sulfa antibiotics, Amitriptyline, Macrodantin, and Nitrofurantoin   History: Past Medical History:  Diagnosis Date  . Allergy   . Anxiety   . Asthma   .  Breast cancer ([place]) 2009  . Depression   . Dyspnea    with heavy pollen   . Hypertension   . Hypothyroidism   . Postmenopausal   . Thyroid disease    Past Surgical History:  Procedure Laterality Date  . ADENOIDECTOMY    . APPENDECTOMY    . BREAST RECONSTRUCTION  06/2008  . CHOLECYSTECTOMY N/A 11/12/2017   Procedure: LAPAROSCOPIC CHOLECYSTECTOMY WITH INTRAOPERATIVE CHOLANGIOGRAM ERAS PATHWAY;  Surgeon: Erroll Luna, MD;  Location: [place];  Service: General;  Laterality: N/A;  . MASTECTOMY  2009   BrCa  . TONSILLECTOMY     Family History  Problem Relation Age of Onset  . Stroke Mother    . Cancer Mother        colon?  [name] Hypertension Father   . Hyperlipidemia Father    Social History   Socioeconomic History  . Marital status: Married    Spouse name: Sonia Side  . Number of children: 1  . Years of education: 41  . Highest education level: Some college, no degree  Occupational History  . Occupation: [employer]    Employer: [employer]: retired  Tobacco Use  . Smoking status: Former Smoker    Quit date: 06/25/2011    Years since quitting: 8.4  . Smokeless tobacco: Never Used  Vaping Use  . Vaping Use: Never used  Substance and Sexual Activity  . Alcohol use: No  . Drug use: No  . Sexual activity: Not Currently  Other Topics Concern  . Not on file  Social History Narrative   Walks 30 min per day. Retired 3 years ago.   Social Determinants of Health   Financial Resource Strain:   . Difficulty of Paying Living Expenses:   Food Insecurity:   . Worried About Charity fundraiser in the Last Year:   . Arboriculturist in the Last Year:   Transportation Needs:   . Film/video editor (Medical):   [name] Lack of Transportation (Non-Medical):   Physical Activity:   . Days of Exercise per Week:   . Minutes of Exercise per Session:   Stress:   . Feeling of Stress :   Social Connections:   . Frequency of Communication with Friends and Family:   . Frequency of Social Gatherings with Friends and Family:   . Attends Religious Services:   . Active Member of Clubs or Organizations:   . Attends Archivist Meetings:   [name] Marital Status:     Tobacco Counseling Counseling given: Not Answered   Clinical Intake:  Pre-visit preparation completed: Yes  Pain : No/denies pain Pain Score: 0-No pain     Nutritional Status: BMI 25 -29 Overweight Nutritional Risks: None Diabetes: No  How often do you need to have someone help you when you read instructions, pamphlets, or other written materials from your doctor or pharmacy?: 1 - Never What is the last grade  level you completed in school?: 2 years at [employer] for [facility] management skills  Diabetic?No  Interpreter Needed?: No      Activities of Daily Living In your present state of health, do you have any difficulty performing the following activities: 12/22/2019  Hearing? Y  Vision? N  Difficulty concentrating or making decisions? N  Walking or climbing stairs? N  Dressing or bathing? N  Doing errands, shopping? N  Preparing Food and eating ? N  Using the Toilet? N  In the past six months, have you accidently leaked  urine? N  Do you have problems with loss of bowel control? N  Managing your Medications? N  Managing your Finances? N  Housekeeping or managing your Housekeeping? N  Some recent data might be hidden    Patient Care Team: Lavada Mesi as PCP - General (Family Medicine)  Indicate any recent Medical Services you may have received from other than [facility] providers in the past year (date may be approximate).     Assessment:   This is a routine wellness examination for [place].  Dietary issues and exercise activities discussed: Current Exercise Habits: Home exercise routine, Type of exercise: walking, Time (Minutes): 20, Frequency (Times/Week): 7, Weekly Exercise (Minutes/Week): 140, Intensity: Moderate  Goals    . Exercise 3x per week (30 min per time)     Increase exercise     . Weight (lb) < 200 lb (90.7 kg)     Would like to loose 15lbs.      Depression Screen PHQ 2/9 Scores 12/22/2019 12/03/2019 03/08/2019 03/02/2019 12/21/2018 08/31/2018 03/02/2018  PHQ - 2 Score 0 _0 0 0 0  PHQ- 9 Score _1 - 0 14    Fall Risk Fall Risk  12/22/2019 12/03/2019 12/21/2018  Falls in the past year? 1 0 0  Number falls in past yr: 0 0 -  Injury with Fall? 0 0 -  Follow up Falls evaluation completed Falls evaluation completed Falls prevention discussed    Any stairs in or around the home? No  If so, are there any without handrails? No  Home free of loose throw rugs in  walkways, pet beds, electrical cords, etc? No  Adequate lighting in your home to reduce risk of falls? Yes   ASSISTIVE DEVICES UTILIZED TO PREVENT FALLS:  Life alert? No  Use of a cane, walker or w/c? No  Grab bars in the bathroom? No  Shower chair or bench in shower? No  Elevated toilet seat or a handicapped toilet? No   Gait steady and fast without use of assistive device  Cognitive Function:     6CIT Screen 12/22/2019 12/21/2018  What Year? 0 points 0 points  What month? 0 points 0 points  What time? 0 points 0 points  Count back from 20 0 points 0 points  Months in reverse 0 points 0 points  Repeat phrase 0 points 0 points  Total Score 0 0    Immunizations Immunization History  Administered Date(s) Administered  . Influenza Split 06/24/2012  . Influenza Whole 03/29/2009  . Influenza, High Dose Seasonal PF 02/26/2017, 04/02/2018  . Influenza,inj,Quad PF,6+ Mos 03/14/2014, 03/08/2015, 05/14/2016, 03/02/2019  . Influenza,trivalent, recombinat, inj, PF 05/29/2012  . Influenza-Unspecified 02/26/2017, 04/02/2018  . Moderna SARS-COVID-2 Vaccination 08/07/2019, 09/04/2019  . Pneumococcal Conjugate-13 02/26/2017  . Pneumococcal Polysaccharide-23 05/29/2012, 06/24/2012, 08/31/2018  . Pneumococcal-Unspecified 02/26/2017  . Tdap 01/31/2011  . Zoster 01/31/2011    TDAP status: Up to date Flu Vaccine status: Up to date Pneumococcal vaccine status: Up to date Covid-19 vaccine status: Completed vaccines  Qualifies for Shingles Vaccine? Completed 2012  Zostavax completed Yes    Screening Tests Health Maintenance  Topic Date Due  . PNA vac Low Risk Adult (2 of 2 - PCV13) 12/02/2020 (Originally 08/31/2019)  . INFLUENZA VACCINE  01/23/2020  . TETANUS/TDAP  01/30/2021  . MAMMOGRAM  02/14/2021  . COLONOSCOPY  03/31/2023  . DEXA SCAN  Completed  . COVID-19 Vaccine  Completed  . Hepatitis C Screening  Completed  Health Maintenance  There are no preventive care reminders to  display for this patient.  Colorectal cancer screening: Completed 2020. Repeat every 5 years Mammogram status: Completed 2020. Repeat every year Bone Density status: Completed 2020. Results reflect: Bone density results: NORMAL. Repeat every 2 years.  Lung Cancer Screening: (Low Dose CT Chest recommended if Age 62-80 years, 30 pack-year currently smoking OR have quit w/in 15years.) does not qualify.   Lung Cancer Screening Referral: NA  Additional Screening:  Hepatitis C Screening: does not qualify; Completed 2020  Vision Screening: Recommended annual ophthalmology exams for early detection of glaucoma and other disorders of the eye. Is the patient up to date with their annual eye exam?  Yes  Who is the provider or what is the name of the office in which the patient attends annual eye exams? [facility] If pt is not established with a provider, would they like to be referred to a provider to establish care? No .   Dental Screening: Recommended annual dental exams for proper oral hygiene  Community Resource Referral / Chronic Care Management: CRR required this visit?  No   CCM required this visit?  No      Plan:       Ms. Rowen , Thank you for taking time to come for your Medicare Wellness Visit. I appreciate your ongoing commitment to your health goals. Please review the following plan we discussed and let me know if I can assist you in the future.   These are the goals we discussed: Goals    . Exercise 3x per week (30 min per time)     Increase exercise     . Weight (lb) < 200 lb (90.7 kg)     Would like to loose 15lbs.       This is a list of the screening recommended for you and due dates:  Health Maintenance  Topic Date Due  . Pneumonia vaccines (2 of 2 - PCV13) 12/02/2020*  . Flu Shot  01/23/2020  . Tetanus Vaccine  01/30/2021  . Mammogram  02/14/2021  . Colon Cancer Screening  03/31/2023  . DEXA scan (bone density measurement)  Completed  . COVID-19  Vaccine  Completed  .  Hepatitis C: One time screening is recommended by Center for Disease Control  (CDC) for  adults born from [birth year] through 1965.   Completed  *Topic was postponed. The date shown is not the original due date.     I have personally reviewed and noted the following in the patient's chart:   . Medical and social history . Use of alcohol, tobacco or illicit drugs  . Current medications and supplements . Functional ability and status . Nutritional status . Physical activity . Advanced directives . List of other physicians . Hospitalizations, surgeries, and ER visits in previous 12 months . Vitals . Screenings to include cognitive, depression, and falls . Referrals and appointments  In addition, I have reviewed and discussed with patient certain preventive protocols, quality metrics, and best practice recommendations. A written personalized care plan for preventive services as well as general preventive health recommendations were provided to patient.    Samuel Bouche, NP   12/22/2019

## 2020-01-03 ENCOUNTER — Ambulatory Visit: Payer: BC Managed Care – PPO | Admitting: Physician Assistant

## 2020-01-04 ENCOUNTER — Encounter: Payer: Self-pay | Admitting: Physician Assistant

## 2020-01-04 ENCOUNTER — Ambulatory Visit (INDEPENDENT_AMBULATORY_CARE_PROVIDER_SITE_OTHER): Payer: BC Managed Care – PPO | Admitting: Physician Assistant

## 2020-01-04 VITALS — BP 128/48 | HR 68 | Ht 64.0 in | Wt 165.0 lb

## 2020-01-04 DIAGNOSIS — I10 Essential (primary) hypertension: Secondary | ICD-10-CM

## 2020-01-04 DIAGNOSIS — F32A Depression, unspecified: Secondary | ICD-10-CM

## 2020-01-04 DIAGNOSIS — K580 Irritable bowel syndrome with diarrhea: Secondary | ICD-10-CM | POA: Diagnosis not present

## 2020-01-04 DIAGNOSIS — Z79899 Other long term (current) drug therapy: Secondary | ICD-10-CM

## 2020-01-04 DIAGNOSIS — F419 Anxiety disorder, unspecified: Secondary | ICD-10-CM

## 2020-01-04 DIAGNOSIS — F329 Major depressive disorder, single episode, unspecified: Secondary | ICD-10-CM | POA: Diagnosis not present

## 2020-01-04 MED ORDER — CLONAZEPAM 0.5 MG PO TABS: 0.5000 mg | ORAL_TABLET | Freq: Two times a day (BID) | ORAL | 1 refills | Status: DC | PRN

## 2020-01-04 MED ORDER — HYDROCHLOROTHIAZIDE 12.5 MG PO TABS: 12.5000 mg | ORAL_TABLET | Freq: Every day | ORAL | 1 refills | Status: DC

## 2020-01-04 MED ORDER — DICYCLOMINE HCL 10 MG PO CAPS: 10.0000 mg | ORAL_CAPSULE | Freq: Two times a day (BID) | ORAL | 1 refills | Status: DC

## 2020-01-04 MED ORDER — LAMOTRIGINE 25 MG PO TABS: 25.0000 mg | ORAL_TABLET | Freq: Two times a day (BID) | ORAL | 0 refills | Status: DC

## 2020-01-04 NOTE — Progress Notes (Signed)
Subjective:    Patient ID: Angelica [name], female    DOB: 1951-02-27, 69 y.o.   MRN: [id number]  HPI  Patient is a 69 year old female with hypertension, anxiety, depression, hypothyroidism who presents to the clinic for 1 month follow-up on new medications.  About 1 month ago patient was seen in clinic and her blood pressure was very elevated.  She was started on HCTZ to go with her lisinopril.  Few days later she had a syncopal event and hit her head.  She went to the ED on 12/06/2019.  ED felt like it could be the medication combination but afterwards patient remembered she taken her temazepam and not going straight to bed but trying to do stuff around the house.  She believes that is what caused her to pass out.  She has not had any other problems on lisinopril HCTZ since starting.  She denies any dizziness.  She is not checking her blood pressure at home.  She did have a Medicare wellness here in office which it was also controlled.  She denies any lower extremity edema.  She was also started on Lamictal to go with her Lexapro.  She does feel like this is helped her anxiety quite a bit.  She still feels that there are days where she is more anxious.  She wonders if she could increase this just a little bit.  She does not feel like BuSpar has helped even since the beginning.  She has been weaning herself off this.  She wonders if she can stop this.  She denies any suicidal thoughts or homicidal idealizations.   .. Active Ambulatory Problems    Diagnosis Date Noted  . Breast cancer ([place]) 01/31/2011  . GAD (generalized anxiety disorder) 01/31/2011  . Asthma 01/31/2011  . Seasonal allergies 01/31/2011  . Hypothyroid 01/31/2011  . Episodic mood disorder ([place]) 10/17/2012  . Anxiety and depression 08/03/2013  . Chondromalacia of right patellofemoral joint 02/10/2014  . Insomnia 03/15/2014  . IBS (irritable bowel syndrome) 08/22/2014  . OAB (overactive bladder) 03/08/2015  . Vitamin D  deficiency 03/09/2015  . Dyslipidemia (high LDL; low HDL) 03/09/2015  . Elevated blood pressure 04/26/2015  . Trapezius muscle spasm 09/28/2015  . Epistaxis 11/14/2015  . Essential hypertension 06/11/2016  . Cellulitis and abscess of buttock 06/04/2017  . Calculus of gallbladder 10/21/2017  . Elevated lipase 10/21/2017  . Diarrhea 10/21/2017  . Upper abdominal pain 10/21/2017  . Left-sided nosebleed 03/02/2018  . TMJ pain dysfunction syndrome 03/02/2018  . Colon polyps 03/31/2018  . Neck fullness 08/31/2018  . Rotator cuff syndrome, left 05/26/2019   Resolved Ambulatory Problems    Diagnosis Date Noted  . Impingement syndrome of left shoulder 08/28/2015  . Acute upper respiratory infection 12/15/2015   Past Medical History:  Diagnosis Date  . Allergy   . Anxiety   . Asthma   . Depression   . Dyspnea   . Hypertension   . Hypothyroidism   . Postmenopausal   . Thyroid disease      Review of Systems  All other systems reviewed and are negative.      Objective:   Physical Exam Vitals reviewed.  Constitutional:      Appearance: Normal appearance.  HENT:     Head: Normocephalic.  Neck:     Vascular: No carotid bruit.  Cardiovascular:     Rate and Rhythm: Normal rate and regular rhythm.     Pulses: Normal pulses.  Pulmonary:  Effort: Pulmonary effort is normal.     Breath sounds: Normal breath sounds.  Lymphadenopathy:     Cervical: No cervical adenopathy.  Neurological:     General: No focal deficit present.     Mental Status: She is alert and oriented to person, place, and time.  Psychiatric:        Mood and Affect: Mood normal.        Behavior: Behavior normal.    .. Depression screen [facility] 2/9 01/04/2020 12/22/2019 12/03/2019 03/08/2019 03/02/2019  Decreased Interest 0 0 1 0 1  Down, Depressed, Hopeless 0 0 1 2 1   PHQ - 2 Score 0 0 2 2 2   Altered sleeping 2 1 3 2 1   Tired, decreased energy 3 1 3 2 1   Change in appetite 3 1 3 2 1   Feeling bad or failure  about yourself  0 0 3 2 1   Trouble concentrating 1 - 3 3 1   Moving slowly or fidgety/restless 0 1 3 0 1  Suicidal thoughts 0 0 0 0 0  PHQ-9 Score 9 4 20 13 8   Difficult doing work/chores - Not difficult at all - Not difficult at all Somewhat difficult  Some recent data might be hidden   .[name] GAD 7 : Generalized Anxiety Score 01/04/2020 12/03/2019 03/08/2019 03/02/2019  Nervous, Anxious, on Edge 1 3 2 1   Control/stop worrying 1 3 2 2   Worry too much - different things 1 3 3 2   Trouble relaxing 2 3 3 2   Restless 2 3 3 2   Easily annoyed or irritable 3 3 3 2   Afraid - awful might happen 1 3 3 2   Total GAD 7 Score 11 21 19 13   Anxiety Difficulty - Very difficult Not difficult at all Somewhat difficult           Assessment & Plan:  Marland KitchenMarland KitchenTawona was seen today for hypertension.  Diagnoses and all orders for this visit:  Anxiety and depression -     lamoTRIgine (LAMICTAL) 25 MG tablet; Take 1 tablet (25 mg total) by mouth 2 (two) times daily. -     clonazePAM (KLONOPIN) 0.5 MG tablet; Take 1 tablet (0.5 mg total) by mouth 2 (two) times daily as needed. for anxiety  Irritable bowel syndrome with diarrhea -     dicyclomine (BENTYL) 10 MG capsule; Take 1 capsule (10 mg total) by mouth 2 (two) times daily.  Essential hypertension -     hydrochlorothiazide (HYDRODIURIL) 12.5 MG tablet; Take 1 tablet (12.5 mg total) by mouth daily. -     BASIC METABOLIC PANEL WITH GFR  Medication management -     BASIC METABOLIC PANEL WITH GFR   BP great on top but a little low on bottom. Continue HCTZ and lisinopril. Pt is not symptomatic. Make sure to stay hydrated. Call with any dizziness.   GAD improved a lot with lamictal. Increased to 50mg  daily.  Stop buspar per patient never really noticed a difference.  Refilled klonapin. Discussed NOT to use with temazempam for sleep.   Bentyl refilled for IBS controlled.   Follow up in 3 months.

## 2020-02-15 ENCOUNTER — Encounter: Payer: Self-pay | Admitting: Physician Assistant

## 2020-02-22 ENCOUNTER — Other Ambulatory Visit: Payer: Self-pay | Admitting: Sports Medicine

## 2020-02-22 DIAGNOSIS — M75102 Unspecified rotator cuff tear or rupture of left shoulder, not specified as traumatic: Secondary | ICD-10-CM

## 2020-03-10 ENCOUNTER — Other Ambulatory Visit: Payer: Self-pay | Admitting: Physician Assistant

## 2020-04-02 ENCOUNTER — Other Ambulatory Visit: Payer: Self-pay | Admitting: Physician Assistant

## 2020-04-02 ENCOUNTER — Encounter: Payer: Self-pay | Admitting: Physician Assistant

## 2020-04-02 DIAGNOSIS — F32A Depression, unspecified: Secondary | ICD-10-CM

## 2020-04-02 DIAGNOSIS — F419 Anxiety disorder, unspecified: Secondary | ICD-10-CM

## 2020-04-03 ENCOUNTER — Other Ambulatory Visit: Payer: Self-pay | Admitting: Physician Assistant

## 2020-04-03 DIAGNOSIS — Z1231 Encounter for screening mammogram for malignant neoplasm of breast: Secondary | ICD-10-CM

## 2020-04-04 ENCOUNTER — Ambulatory Visit (INDEPENDENT_AMBULATORY_CARE_PROVIDER_SITE_OTHER): Payer: BC Managed Care – PPO | Admitting: Family Medicine

## 2020-04-04 ENCOUNTER — Encounter: Payer: Self-pay | Admitting: Family Medicine

## 2020-04-04 DIAGNOSIS — T8090XA Unspecified complication following infusion and therapeutic injection, initial encounter: Secondary | ICD-10-CM | POA: Insufficient documentation

## 2020-04-04 DIAGNOSIS — T881XXA Other complications following immunization, not elsewhere classified, initial encounter: Secondary | ICD-10-CM | POA: Diagnosis not present

## 2020-04-04 NOTE — Progress Notes (Signed)
Angelica [name] - 69 y.o. female MRN [id number]  Date of birth: Aug 10, 1950  Subjective No chief complaint on file.   HPI Angelica [name] is a 69 y.o. female here today with complaint of redness and swelling of her L upper arm.  She received flu vaccine and pneumonia vaccine in L arm on 10/10 and started having swelling a redness a few hours after injection.  She has had continued redness and warmth of upper arm.  She has not had fever, chills, fatigue or nausea.  Symptoms have improved some today since initial onset.   ROS:  A comprehensive ROS was completed and negative except as noted per HPI  Allergies  Allergen Reactions  . Penicillins Hives, Swelling, Rash and Other (See Comments)    PATIENT HAS HAD A PCN REACTION WITH IMMEDIATE RASH, FACIAL/TONGUE/THROAT SWELLING, SOB, OR LIGHTHEADEDNESS WITH HYPOTENSION:  #  #  YES  #  # Has patient had a PCN reaction causing severe rash involving mucus membranes or skin necrosis: NO PATIENT HAS HAD A PCN REACTION THAT REQUIRED HOSPITALIZATION:  #  #  YES  #  #  Has patient had a PCN reaction occurring within the last 10 years: NO    . Red Dye Hives  . Sulfa Antibiotics Nausea And Vomiting and Hives  . Amitriptyline Other (See Comments)    Crazy dreams    . Macrodantin Nausea And Vomiting  . Nitrofurantoin Nausea And Vomiting    Past Medical History:  Diagnosis Date  . Allergy   . Anxiety   . Asthma   . Breast cancer ([place]) 2009  . Depression   . Dyspnea    with heavy pollen   . Hypertension   . Hypothyroidism   . Postmenopausal   . Thyroid disease     Past Surgical History:  Procedure Laterality Date  . ADENOIDECTOMY    . APPENDECTOMY    . BREAST RECONSTRUCTION  06/2008  . CHOLECYSTECTOMY N/A 11/12/2017   Procedure: LAPAROSCOPIC CHOLECYSTECTOMY WITH INTRAOPERATIVE CHOLANGIOGRAM ERAS PATHWAY;  Surgeon: Erroll Luna, MD;  Location: [place];  Service: General;  Laterality: N/A;  . MASTECTOMY  2009   BrCa  . TONSILLECTOMY       Social History   Socioeconomic History  . Marital status: Married    Spouse name: Sonia Side  . Number of children: 1  . Years of education: 60  . Highest education level: Some college, no degree  Occupational History  . Occupation: [employer]    Employer: [employer]: retired  Tobacco Use  . Smoking status: Former Smoker    Quit date: 06/25/2011    Years since quitting: 8.7  . Smokeless tobacco: Never Used  Vaping Use  . Vaping Use: Never used  Substance and Sexual Activity  . Alcohol use: No  . Drug use: No  . Sexual activity: Not Currently  Other Topics Concern  . Not on file  Social History Narrative   Walks 30 min per day. Retired 3 years ago.   Social Determinants of Health   Financial Resource Strain:   . Difficulty of Paying Living Expenses: Not on file  Food Insecurity:   . Worried About Charity fundraiser in the Last Year: Not on file  . Ran Out of Food in the Last Year: Not on file  Transportation Needs:   . Lack of Transportation (Medical): Not on file  . Lack of Transportation (Non-Medical): Not on file  Physical Activity:   . Days  of Exercise per Week: Not on file  . Minutes of Exercise per Session: Not on file  Stress:   . Feeling of Stress : Not on file  Social Connections:   . Frequency of Communication with Friends and Family: Not on file  . Frequency of Social Gatherings with Friends and Family: Not on file  . Attends Religious Services: Not on file  . Active Member of Clubs or Organizations: Not on file  . Attends Archivist Meetings: Not on file  . Marital Status: Not on file    Family History  Problem Relation Age of Onset  . Stroke Mother   . Cancer Mother        colon?  [name] Hypertension Father   . Hyperlipidemia Father     Health Maintenance  Topic Date Due  . INFLUENZA VACCINE  01/23/2020  . PNA vac Low Risk Adult (2 of 2 - PCV13) 12/02/2020 (Originally 08/31/2019)  . TETANUS/TDAP  01/30/2021  . MAMMOGRAM  02/14/2021   . COLONOSCOPY  03/31/2023  . DEXA SCAN  Completed  . COVID-19 Vaccine  Completed  . Hepatitis C Screening  Completed     ----------------------------------------------------------------------------------------------------------------------------------------------------------------------------------------------------------------- Physical Exam LMP 06/24/2000   Physical Exam Constitutional:      Appearance: Normal appearance.  HENT:     Head: Normocephalic and atraumatic.  Skin:    Comments: Redness with tenderness and warmth of the L upper arm from site of injection to just above elbow.   Neurological:     General: No focal deficit present.     Mental Status: She is alert.  Psychiatric:        Mood and Affect: Mood normal.        Behavior: Behavior normal.     ------------------------------------------------------------------------------------------------------------------------------------------------------------------------------------------------------------------- Assessment and Plan  Injection site reaction Rapid onset and current symptoms most consistent with injection reaction.  Recommend applying ice and may use tylenol/ibuprofen as needed Instructed that if redness continues to spread, pain worsens or if she develops systemic symptoms such as fever she should contact the clinic.     No orders of the defined types were placed in this encounter.   No follow-ups on file.    This visit occurred during the SARS-CoV-2 public health emergency.  Safety protocols were in place, including screening questions prior to the visit, additional usage of staff PPE, and extensive cleaning of exam room while observing appropriate contact time as indicated for disinfecting solutions.

## 2020-04-04 NOTE — Assessment & Plan Note (Signed)
Rapid onset and current symptoms most consistent with injection reaction.  Recommend applying ice and may use tylenol/ibuprofen as needed Instructed that if redness continues to spread, pain worsens or if she develops systemic symptoms such as fever she should contact the clinic.

## 2020-04-04 NOTE — Patient Instructions (Signed)
Post-Injection Inflammatory Reaction A post-injection inflammatory reaction is swelling, irritation, and other problems that can develop after a person gets a shot (injection). The reaction can develop at and around the injection site or far away from the injection site. In some cases, it can develop right away and last for a short time. In other cases, it can develop weeks after the injection and last for several hours or days. In most cases, the reaction is not serious and will go away on its own. What are the causes? This condition may be caused by:  An allergy to the needle or medicine.  A response by your body's defense system (immune system).  Damage to the surrounding tissue from the injection.  Germs that enter the body through the injection site. What are the signs or symptoms? Common symptoms of this condition include:  Itchiness.  Redness.  Warmth.  Rash.  Swelling.  Tenderness.  Pain. Other symptoms include:  Drainage.  Fever or chills.  Muscle aches.  Nausea or vomiting.  Loss of appetite.  Headache.  Dizziness. In severe cases, seizures, asthma, or a severe allergic reaction (anaphylaxis) can occur. These cases are rare. How is this diagnosed? This condition may be diagnosed with a physical exam. During the exam, a circle may be drawn around the injection site. The circle helps to show whether redness in the area is spreading. How is this treated? Treatment for this condition depends on what caused the reaction and how severe the reaction is. Treatment may include:  Putting an ice pack over the injection site.  Taking a nonsteroidal anti-inflammatory drug (NSAID) to lessen swelling and itching.  Taking antibiotic medicine.  Taking over-the-counter pain medicine. If the reaction affects a joint, you may also need to rest the joint for a while. Follow these instructions at home: Medicines  If you were prescribed antibiotic medicine, take or  apply it as told by your health care provider. Do not stop taking or applying the antibiotic even if you start to feel better.  Take over-the-counter and prescription medicines only as told by your health care provider.  Ask your health care provider if the medicine prescribed to you requires you to avoid driving or using heavy machinery. Managing pain and swelling  If directed, put ice on the affected area: ? Put ice in a plastic bag. ? Place a towel between your skin and the bag. ? Leave the ice on for 20 minutes, 2-3 times a day. General instructions  If the reaction affects a joint, rest your joint. Ask your health care provider when you can start to use your joint again.  Keep the injection site clean.  Keep all follow-up visits as told by your health care provider. This is important. Contact a health care provider if:  Your symptoms last for several hours.  You have a fever or chills.  You have muscle aches. Get help right away if:  Your symptoms get worse.  You have trouble breathing.  You have a rash, raised bumps (hives), or severe itching.  You have seizures. Summary  A post-injection inflammatory reaction is swelling, irritation, and other problems that can develop after a person gets a shot (injection).  It may be caused by an allergy, an immune response, damage to the tissues surrounding the injection, or an infection.  This condition may be treated by icing the area and by taking medicine. This information is not intended to replace advice given to you by your health care provider. Make   sure you discuss any questions you have with your health care provider. Document Revised: 10/02/2018 Document Reviewed: 05/18/2018 Elsevier Patient Education  2020 [employer]  

## 2020-04-05 ENCOUNTER — Other Ambulatory Visit: Payer: Self-pay | Admitting: Physician Assistant

## 2020-04-05 DIAGNOSIS — I10 Essential (primary) hypertension: Secondary | ICD-10-CM

## 2020-04-05 DIAGNOSIS — F32A Depression, unspecified: Secondary | ICD-10-CM

## 2020-04-05 DIAGNOSIS — F419 Anxiety disorder, unspecified: Secondary | ICD-10-CM

## 2020-04-10 ENCOUNTER — Ambulatory Visit (INDEPENDENT_AMBULATORY_CARE_PROVIDER_SITE_OTHER): Payer: BC Managed Care – PPO | Admitting: Physician Assistant

## 2020-04-10 ENCOUNTER — Encounter: Payer: Self-pay | Admitting: Physician Assistant

## 2020-04-10 VITALS — BP 122/68 | HR 62 | Wt 168.0 lb

## 2020-04-10 DIAGNOSIS — J4520 Mild intermittent asthma, uncomplicated: Secondary | ICD-10-CM | POA: Diagnosis not present

## 2020-04-10 DIAGNOSIS — F39 Unspecified mood [affective] disorder: Secondary | ICD-10-CM

## 2020-04-10 DIAGNOSIS — K580 Irritable bowel syndrome with diarrhea: Secondary | ICD-10-CM

## 2020-04-10 DIAGNOSIS — E890 Postprocedural hypothyroidism: Secondary | ICD-10-CM | POA: Diagnosis not present

## 2020-04-10 DIAGNOSIS — I10 Essential (primary) hypertension: Secondary | ICD-10-CM | POA: Diagnosis not present

## 2020-04-10 DIAGNOSIS — F419 Anxiety disorder, unspecified: Secondary | ICD-10-CM

## 2020-04-10 DIAGNOSIS — F32A Depression, unspecified: Secondary | ICD-10-CM

## 2020-04-10 MED ORDER — LAMOTRIGINE 100 MG PO TABS: 100.0000 mg | ORAL_TABLET | Freq: Every day | ORAL | 1 refills | Status: DC

## 2020-04-10 NOTE — Progress Notes (Signed)
Subjective:    Patient ID: Angelica [name], female    DOB: March 16, 1951, 69 y.o.   MRN: [id number]  HPI  Pt is a 69 yo female with HTN, asthma, HLD, mood disorder, MDD, anxiety, hypothyroidism who presents to the clinic for medication refills.   Hypertension-patient is doing great.  She denies any chest pain, palpitations, headaches, vision changes.  She is taking her medication daily.  Her asthma is doing great.  She denies any concerns or complaints.  She is compliant with her thyroid medication daily.  She denies any concerns or complaints.  Her mood has been better with Lamictal addition.  She feels like she continues to struggle with some irritation and mood changes.  She would like to make another med adjustment today.  She denies any suicidal thoughts or homicidal idealizations or hallucinations.     .. Active Ambulatory Problems    Diagnosis Date Noted  . Breast cancer ([place]) 01/31/2011  . GAD (generalized anxiety disorder) 01/31/2011  . Asthma 01/31/2011  . Seasonal allergies 01/31/2011  . Hypothyroid 01/31/2011  . Episodic mood disorder ([place]) 10/17/2012  . Anxiety and depression 08/03/2013  . Chondromalacia of right patellofemoral joint 02/10/2014  . Insomnia 03/15/2014  . IBS (irritable bowel syndrome) 08/22/2014  . Dyslipidemia (high LDL; low HDL) 03/09/2015  . Elevated blood pressure 04/26/2015  . Trapezius muscle spasm 09/28/2015  . Epistaxis 11/14/2015  . Essential hypertension 06/11/2016  . Cellulitis and abscess of buttock 06/04/2017  . Calculus of gallbladder 10/21/2017  . Elevated lipase 10/21/2017  . Diarrhea 10/21/2017  . Upper abdominal pain 10/21/2017  . Left-sided nosebleed 03/02/2018  . TMJ pain dysfunction syndrome 03/02/2018  . Colon polyps 03/31/2018  . Neck fullness 08/31/2018  . Rotator cuff syndrome, left 05/26/2019  . Injection site reaction 04/04/2020   Resolved Ambulatory Problems    Diagnosis Date Noted  . OAB (overactive bladder)  03/08/2015  . Vitamin D deficiency 03/09/2015  . Impingement syndrome of left shoulder 08/28/2015  . Acute upper respiratory infection 12/15/2015   Past Medical History:  Diagnosis Date  . Allergy   . Anxiety   . Asthma   . Depression   . Dyspnea   . Hypertension   . Hypothyroidism   . Postmenopausal   . Thyroid disease     Review of Systems  All other systems reviewed and are negative.  See HPI.     Objective:   Physical Exam Vitals reviewed.  Constitutional:      Appearance: Normal appearance.  Cardiovascular:     Rate and Rhythm: Normal rate and regular rhythm.     Pulses: Normal pulses.  Pulmonary:     Effort: Pulmonary effort is normal.  Musculoskeletal:     Right lower leg: No edema.     Left lower leg: No edema.  Neurological:     General: No focal deficit present.     Mental Status: She is alert and oriented to person, place, and time.     .. Depression screen [facility] 2/9 04/10/2020 01/04/2020 12/22/2019 12/03/2019 03/08/2019  Decreased Interest 0 0 0 1 0  Down, Depressed, Hopeless 0 0 0 1 2  PHQ - 2 Score 0 0 0 2 2  Altered sleeping 0 2 1 3 2   Tired, decreased energy 2 3 1 3 2   Change in appetite 0 3 1 3 2   Feeling bad or failure about yourself  0 0 0 3 2  Trouble concentrating 3 1 - 3 3  Moving  slowly or fidgety/restless 0 0 1 3 0  Suicidal thoughts 0 0 0 0 0  PHQ-9 Score 5 9 4 20 13   Difficult doing work/chores Somewhat difficult - Not difficult at all - Not difficult at all  Some recent data might be hidden   .[name] GAD 7 : Generalized Anxiety Score 04/10/2020 01/04/2020 12/03/2019 03/08/2019  Nervous, Anxious, on Edge 2 1 3 2   Control/stop worrying 2 1 3 2   Worry too much - different things 2 1 3 3   Trouble relaxing 2 2 3 3   Restless 2 2 3 3   Easily annoyed or irritable 2 3 3 3   Afraid - awful might happen 0 1 3 3   Total GAD 7 Score 12 11 21 19   Anxiety Difficulty Somewhat difficult - Very difficult Not difficult at all           Assessment &  Plan:  Marland KitchenMarland KitchenLuara was seen today for medication recheck.  Diagnoses and all orders for this visit:  Essential hypertension  Mild intermittent asthma without complication  Postablative hypothyroidism  Anxiety and depression -     lamoTRIgine (LAMICTAL) 100 MG tablet; Take 1 tablet (100 mg total) by mouth daily.  Episodic mood disorder (HCC) -     lamoTRIgine (LAMICTAL) 100 MG tablet; Take 1 tablet (100 mg total) by mouth daily.  Irritable bowel syndrome with diarrhea   Hypertension stable on second recheck.  Refilled losartan and HCTZ.  Follow-up in 6 months.  Patient has up-to-date CMP.  Hypothyroidism controlled.  Continue levothyroxine and will recheck thyroid levels regularly.  IBS controlled with as needed Bentyl.  Refilled today.  PHQ-9 and GAD-7 are not to goal.  She did have some initial good response with Lamictal.  Will increase to 100 mg a day.  This is to be taken in combination with Lexapro.  Certainly reach out if having any adverse events or concerns with dose increase.  Follow-up in 3 months.

## 2020-04-11 MED ORDER — DICYCLOMINE HCL 10 MG PO CAPS: 10.0000 mg | ORAL_CAPSULE | Freq: Two times a day (BID) | ORAL | 1 refills | Status: DC

## 2020-04-11 MED ORDER — HYDROCHLOROTHIAZIDE 12.5 MG PO TABS: 12.5000 mg | ORAL_TABLET | Freq: Every day | ORAL | 1 refills | Status: DC

## 2020-04-27 ENCOUNTER — Other Ambulatory Visit: Payer: Self-pay

## 2020-04-27 ENCOUNTER — Ambulatory Visit
Admission: RE | Admit: 2020-04-27 | Discharge: 2020-04-27 | Disposition: A | Discharge status: Home / Self Care | Payer: BC Managed Care – PPO | Source: Ambulatory Visit | Attending: Physician Assistant | Admitting: Physician Assistant

## 2020-04-27 ENCOUNTER — Other Ambulatory Visit: Payer: Self-pay | Admitting: Physician Assistant

## 2020-04-27 DIAGNOSIS — Z1231 Encounter for screening mammogram for malignant neoplasm of breast: Secondary | ICD-10-CM

## 2020-05-01 NOTE — Telephone Encounter (Addendum)
-----   Message from Angelica Stade, PA-C sent at 05/01/2020  5:54 AM EST ----- Normal mammogram. Follow up 1 year.    Patient advised and aware of result.

## 2020-05-01 NOTE — Progress Notes (Signed)
Normal mammogram. Follow up 1 year.

## 2020-05-27 DIAGNOSIS — Z23 Encounter for immunization: Secondary | ICD-10-CM | POA: Diagnosis not present

## 2020-05-28 ENCOUNTER — Encounter: Payer: Self-pay | Admitting: Physician Assistant

## 2020-07-12 ENCOUNTER — Other Ambulatory Visit: Payer: Self-pay | Admitting: Physician Assistant

## 2020-07-12 DIAGNOSIS — F32A Depression, unspecified: Secondary | ICD-10-CM

## 2020-07-12 DIAGNOSIS — F419 Anxiety disorder, unspecified: Secondary | ICD-10-CM

## 2020-07-14 ENCOUNTER — Other Ambulatory Visit: Payer: Self-pay | Admitting: Physician Assistant

## 2020-07-14 DIAGNOSIS — F39 Unspecified mood [affective] disorder: Secondary | ICD-10-CM

## 2020-07-14 DIAGNOSIS — F419 Anxiety disorder, unspecified: Secondary | ICD-10-CM

## 2020-07-14 DIAGNOSIS — F32A Depression, unspecified: Secondary | ICD-10-CM

## 2020-07-14 DIAGNOSIS — I10 Essential (primary) hypertension: Secondary | ICD-10-CM

## 2020-09-18 ENCOUNTER — Other Ambulatory Visit: Payer: Self-pay | Admitting: Physician Assistant

## 2020-09-18 DIAGNOSIS — I10 Essential (primary) hypertension: Secondary | ICD-10-CM

## 2020-09-18 DIAGNOSIS — F32A Depression, unspecified: Secondary | ICD-10-CM

## 2020-09-18 DIAGNOSIS — F419 Anxiety disorder, unspecified: Secondary | ICD-10-CM

## 2020-10-09 ENCOUNTER — Ambulatory Visit: Payer: BC Managed Care – PPO | Admitting: Physician Assistant

## 2020-10-10 DIAGNOSIS — M79671 Pain in right foot: Secondary | ICD-10-CM | POA: Diagnosis not present

## 2020-10-10 DIAGNOSIS — M79672 Pain in left foot: Secondary | ICD-10-CM | POA: Diagnosis not present

## 2020-10-10 DIAGNOSIS — L600 Ingrowing nail: Secondary | ICD-10-CM | POA: Diagnosis not present

## 2020-10-10 DIAGNOSIS — M79675 Pain in left toe(s): Secondary | ICD-10-CM | POA: Diagnosis not present

## 2020-10-16 ENCOUNTER — Encounter: Payer: Self-pay | Admitting: Physician Assistant

## 2020-10-16 ENCOUNTER — Other Ambulatory Visit: Payer: Self-pay

## 2020-10-16 ENCOUNTER — Ambulatory Visit (INDEPENDENT_AMBULATORY_CARE_PROVIDER_SITE_OTHER): Payer: Medicare Other | Admitting: Physician Assistant

## 2020-10-16 VITALS — BP 142/53 | HR 66 | Ht 64.0 in | Wt 168.0 lb

## 2020-10-16 DIAGNOSIS — F419 Anxiety disorder, unspecified: Secondary | ICD-10-CM | POA: Diagnosis not present

## 2020-10-16 DIAGNOSIS — F32A Depression, unspecified: Secondary | ICD-10-CM

## 2020-10-16 DIAGNOSIS — F411 Generalized anxiety disorder: Secondary | ICD-10-CM | POA: Diagnosis not present

## 2020-10-16 DIAGNOSIS — F39 Unspecified mood [affective] disorder: Secondary | ICD-10-CM

## 2020-10-16 DIAGNOSIS — I10 Essential (primary) hypertension: Secondary | ICD-10-CM | POA: Diagnosis not present

## 2020-10-16 MED ORDER — CLONAZEPAM 0.5 MG PO TABS: 0.5000 mg | ORAL_TABLET | Freq: Two times a day (BID) | ORAL | 1 refills | Status: DC | PRN

## 2020-10-16 MED ORDER — LAMOTRIGINE 100 MG PO TABS: 100.0000 mg | ORAL_TABLET | Freq: Two times a day (BID) | ORAL | 1 refills | Status: DC

## 2020-10-16 MED ORDER — HYDROCHLOROTHIAZIDE 12.5 MG PO TABS: 12.5000 mg | ORAL_TABLET | Freq: Every day | ORAL | 1 refills | Status: DC

## 2020-10-16 NOTE — Progress Notes (Addendum)
Subjective:    Patient ID: Angelica [name], female    DOB: Nov 28, 1950, 70 y.o.   MRN: [id number]  HPI  Patient is a 70 year old female with hypertension, anxiety, depression, hypothyroidism who presents to the clinic for 6 month follow-up for anxiety and depression.  She recently started taking care of her 63 year old father 24/7 since January of this year. She worries most days and feels like increasing her lamictal could help to relieve these constant thoughts. She has not noticed any side effects from her medications. She uses klonapin as needed but recently felt like she needed most days. Continues on lexapro.   She recently fell twice, but they were both mechanical falls and were not preceded by pre-syncope or dizziness. She takes her HCTZ and lisinopril regularly without any issues. She is taking her blood pressure at home and says it is usually in the 130s, with occasional 140s.  She is concerned about her recent weight gain. Since starting to take care of her father she is not exercising frequently and has increased her fast food intake.   .. Active Ambulatory Problems    Diagnosis Date Noted  . Breast cancer ([place]) 01/31/2011  . GAD (generalized anxiety disorder) 01/31/2011  . Asthma 01/31/2011  . Seasonal allergies 01/31/2011  . Hypothyroid 01/31/2011  . Episodic mood disorder ([place]) 10/17/2012  . Anxiety and depression 08/03/2013  . Chondromalacia of right patellofemoral joint 02/10/2014  . Insomnia 03/15/2014  . IBS (irritable bowel syndrome) 08/22/2014  . Dyslipidemia (high LDL; low HDL) 03/09/2015  . Elevated blood pressure 04/26/2015  . Trapezius muscle spasm 09/28/2015  . Epistaxis 11/14/2015  . Essential hypertension 06/11/2016  . Cellulitis and abscess of buttock 06/04/2017  . Calculus of gallbladder 10/21/2017  . Elevated lipase 10/21/2017  . Diarrhea 10/21/2017  . Upper abdominal pain 10/21/2017  . Left-sided nosebleed 03/02/2018  . TMJ pain dysfunction syndrome  03/02/2018  . Colon polyps 03/31/2018  . Neck fullness 08/31/2018  . Rotator cuff syndrome, left 05/26/2019  . Injection site reaction 04/04/2020   Resolved Ambulatory Problems    Diagnosis Date Noted  . OAB (overactive bladder) 03/08/2015  . Vitamin D deficiency 03/09/2015  . Impingement syndrome of left shoulder 08/28/2015  . Acute upper respiratory infection 12/15/2015   Past Medical History:  Diagnosis Date  . Allergy   . Anxiety   . Asthma   . Depression   . Dyspnea   . Hypertension   . Hypothyroidism   . Postmenopausal   . Thyroid disease      Review of Systems  Constitutional: Positive for activity change and unexpected weight change. Negative for chills and fever.  Eyes: Negative for visual disturbance.  Respiratory: Negative for chest tightness and shortness of breath.   Cardiovascular: Negative for chest pain and leg swelling.  Neurological: Negative for dizziness, weakness and light-headedness.  Psychiatric/Behavioral: The patient is nervous/anxious.   All other systems reviewed and are negative.      Objective:   Physical Exam Vitals reviewed.  Constitutional:      Appearance: Normal appearance.  HENT:     Head: Normocephalic.  Cardiovascular:     Rate and Rhythm: Normal rate and regular rhythm.     Pulses: Normal pulses.  Pulmonary:     Effort: Pulmonary effort is normal.     Breath sounds: Normal breath sounds.  Skin:    General: Skin is warm.  Neurological:     General: No focal deficit present.  Mental Status: She is alert and oriented to person, place, and time.  Psychiatric:        Mood and Affect: Mood normal.        Behavior: Behavior normal.    .. Depression screen [facility] 2/9 10/16/2020 04/10/2020 01/04/2020 12/22/2019 12/03/2019  Decreased Interest 0 0 0 0 1  Down, Depressed, Hopeless 0 0 0 0 1  PHQ - 2 Score 0 0 0 0 2  Altered sleeping 2 0 2 1 3   Tired, decreased energy 2 2 3 1 3   Change in appetite 2 0 3 1 3   Feeling bad or  failure about yourself  2 0 0 0 3  Trouble concentrating 2 3 1  - 3  Moving slowly or fidgety/restless 0 0 0 1 3  Suicidal thoughts 0 0 0 0 0  PHQ-9 Score 10 5 9 4 20   Difficult doing work/chores Somewhat difficult Somewhat difficult - Not difficult at all -  Some recent data might be hidden   .[name] GAD 7 : Generalized Anxiety Score 10/16/2020 04/10/2020 01/04/2020 12/03/2019  Nervous, Anxious, on Edge 2 2 1 3   Control/stop worrying 2 2 1 3   Worry too much - different things 2 2 1 3   Trouble relaxing 2 2 2 3   Restless 2 2 2 3   Easily annoyed or irritable 3 2 3 3   Afraid - awful might happen 3 0 1 3  Total GAD 7 Score 16 12 11 21   Anxiety Difficulty Very difficult Somewhat difficult - Very difficult           Assessment & Plan:  Marland KitchenMarland KitchenSindi was seen today for follow-up.  Diagnoses and all orders for this visit:  Anxiety and depression -     clonazePAM (KLONOPIN) 0.5 MG tablet; Take 1 tablet (0.5 mg total) by mouth 2 (two) times daily as needed. for anxiety -     lamoTRIgine (LAMICTAL) 100 MG tablet; Take 1 tablet (100 mg total) by mouth 2 (two) times daily.  Essential hypertension -     hydrochlorothiazide (HYDRODIURIL) 12.5 MG tablet; Take 1 tablet (12.5 mg total) by mouth daily.  Episodic mood disorder (HCC) -     lamoTRIgine (LAMICTAL) 100 MG tablet; Take 1 tablet (100 mg total) by mouth 2 (two) times daily.   BP elevated systolic and low diastolic.  Continue HCTZ and lisinopril. She has good home readings. Continue to monitor at home.   GAD has improved with Lamictal. Increase dose to twice daily and monitor for changes in anxiety/mood. Suspect a lot of this is due to her caregiver stress. Discussed importance of self care. Continue lexapro and and as needed klonapin. Continue to use klonapin sparingly.   [name].Discussed low carb diet with 1500 calories and 80g of protein.  Exercising at least 150 minutes a week.  [employer]itness Pal could be a Microbiologist.    Follow up in 6  months.   Marland KitchenVernetta Honey PA-C, have reviewed and agree with the above documentation in it's entirety.

## 2020-10-16 NOTE — Progress Notes (Deleted)
   Subjective:    Patient ID: Angelica [name], female    DOB: March 16, 1951, 70 y.o.   MRN: [id number]  HPI  lexapro klonapin lamictal  Review of Systems     Objective:   Physical Exam    ... GAD 7 : Generalized Anxiety Score 04/10/2020 01/04/2020 12/03/2019 03/08/2019  Nervous, Anxious, on Edge 2 1 3 2   Control/stop worrying 2 1 3 2   Worry too much - different things 2 1 3 3   Trouble relaxing 2 2 3 3   Restless 2 2 3 3   Easily annoyed or irritable 2 3 3 3   Afraid - awful might happen 0 1 3 3   Total GAD 7 Score 12 11 21 19   Anxiety Difficulty Somewhat difficult - Very difficult Not difficult at all    .[name] Depression screen [facility] 2/9 04/10/2020 01/04/2020 12/22/2019 12/03/2019 03/08/2019  Decreased Interest 0 0 0 1 0  Down, Depressed, Hopeless 0 0 0 1 2  PHQ - 2 Score 0 0 0 2 2  Altered sleeping 0 2 1 3 2   Tired, decreased energy 2 3 1 3 2   Change in appetite 0 3 1 3 2   Feeling bad or failure about yourself  0 0 0 3 2  Trouble concentrating 3 1 - 3 3  Moving slowly or fidgety/restless 0 0 1 3 0  Suicidal thoughts 0 0 0 0 0  PHQ-9 Score 5 9 4 20 13   Difficult doing work/chores Somewhat difficult - Not difficult at all - Not difficult at all  Some recent data might be hidden       Assessment & Plan:

## 2020-10-24 DIAGNOSIS — L600 Ingrowing nail: Secondary | ICD-10-CM | POA: Diagnosis not present

## 2020-10-24 DIAGNOSIS — M79674 Pain in right toe(s): Secondary | ICD-10-CM | POA: Diagnosis not present

## 2020-10-24 DIAGNOSIS — M79675 Pain in left toe(s): Secondary | ICD-10-CM | POA: Diagnosis not present

## 2020-10-24 DIAGNOSIS — B351 Tinea unguium: Secondary | ICD-10-CM | POA: Diagnosis not present

## 2020-11-16 ENCOUNTER — Telehealth: Payer: Self-pay | Admitting: Neurology

## 2020-11-16 NOTE — Telephone Encounter (Signed)
Tried to submit Lamotrigine via Covermymeds after receiving request.  This medication or product is on your plan's list of covered drugs. Prior authorization is not required at this time. If your pharmacy has questions regarding the processing of your prescription, please have them call the [facility] pharmacy help desk at [phone]. **Please note: This request was submitted electronically. Formulary lowering, tiering exception, cost reduction and/or pre-benefit determination review (including prospective Medicare hospice reviews) requests cannot be requested using this method of submission. Providers contact us at [phone] for further assistance.

## 2020-11-21 DIAGNOSIS — B351 Tinea unguium: Secondary | ICD-10-CM | POA: Diagnosis not present

## 2020-11-21 DIAGNOSIS — L600 Ingrowing nail: Secondary | ICD-10-CM | POA: Diagnosis not present

## 2020-11-21 DIAGNOSIS — M79674 Pain in right toe(s): Secondary | ICD-10-CM | POA: Diagnosis not present

## 2020-11-21 DIAGNOSIS — M79675 Pain in left toe(s): Secondary | ICD-10-CM | POA: Diagnosis not present

## 2020-11-23 ENCOUNTER — Telehealth: Payer: Self-pay | Admitting: Neurology

## 2020-11-23 DIAGNOSIS — J4520 Mild intermittent asthma, uncomplicated: Secondary | ICD-10-CM

## 2020-11-23 MED ORDER — ALBUTEROL SULFATE HFA 108 (90 BASE) MCG/ACT IN AERS: 2.0000 | INHALATION_SPRAY | Freq: Four times a day (QID) | RESPIRATORY_TRACT | 0 refills | Status: DC | PRN

## 2020-11-23 NOTE — Telephone Encounter (Signed)
Patient left vm stating she has not used Albuterol in a long while, but having issues with allergies and would like a refill. She is also having congestion/runny nose. [phone].

## 2020-11-23 NOTE — Telephone Encounter (Signed)
Spoke with patient. She has been taking Zyrtec, but still having congestion/runny nose. She is having some wheezing at night. Her albuterol is expired. She does not have nebulizer. Sent a new RX for Albuterol. Offered virtual appt. Patient declined for now and will call back if symptoms worsen. Advised she could switch her allergy medication to Allegra or Claritin to see if this is of more help to her. She hasn't had to take any OTC cold meds. [place].

## 2020-11-24 NOTE — Telephone Encounter (Signed)
LMOM for patient to call back and let us know if she has tried nasal sprays.

## 2020-11-24 NOTE — Telephone Encounter (Signed)
Has she tried any nasal sprays. They can be very effective? If she has what has she tried?

## 2020-12-15 ENCOUNTER — Other Ambulatory Visit: Payer: Self-pay | Admitting: Physician Assistant

## 2020-12-19 ENCOUNTER — Other Ambulatory Visit: Payer: Self-pay

## 2020-12-19 DIAGNOSIS — L600 Ingrowing nail: Secondary | ICD-10-CM | POA: Diagnosis not present

## 2020-12-19 DIAGNOSIS — B351 Tinea unguium: Secondary | ICD-10-CM | POA: Diagnosis not present

## 2020-12-19 DIAGNOSIS — M79674 Pain in right toe(s): Secondary | ICD-10-CM | POA: Diagnosis not present

## 2020-12-19 DIAGNOSIS — M79675 Pain in left toe(s): Secondary | ICD-10-CM | POA: Diagnosis not present

## 2020-12-19 DIAGNOSIS — E785 Hyperlipidemia, unspecified: Secondary | ICD-10-CM

## 2020-12-19 MED ORDER — ATORVASTATIN CALCIUM 40 MG PO TABS: 40.0000 mg | ORAL_TABLET | Freq: Every day | ORAL | 3 refills | Status: DC

## 2021-01-16 DIAGNOSIS — L600 Ingrowing nail: Secondary | ICD-10-CM | POA: Diagnosis not present

## 2021-01-16 DIAGNOSIS — M79675 Pain in left toe(s): Secondary | ICD-10-CM | POA: Diagnosis not present

## 2021-01-16 DIAGNOSIS — B351 Tinea unguium: Secondary | ICD-10-CM | POA: Diagnosis not present

## 2021-01-16 DIAGNOSIS — M79674 Pain in right toe(s): Secondary | ICD-10-CM | POA: Diagnosis not present

## 2021-01-23 ENCOUNTER — Other Ambulatory Visit: Payer: Self-pay | Admitting: Physician Assistant

## 2021-01-23 DIAGNOSIS — J4520 Mild intermittent asthma, uncomplicated: Secondary | ICD-10-CM

## 2021-01-24 MED ORDER — ALBUTEROL SULFATE HFA 108 (90 BASE) MCG/ACT IN AERS: 2.0000 | INHALATION_SPRAY | Freq: Four times a day (QID) | RESPIRATORY_TRACT | 0 refills | Status: DC | PRN

## 2021-02-13 DIAGNOSIS — B351 Tinea unguium: Secondary | ICD-10-CM | POA: Diagnosis not present

## 2021-02-13 DIAGNOSIS — M79674 Pain in right toe(s): Secondary | ICD-10-CM | POA: Diagnosis not present

## 2021-02-13 DIAGNOSIS — M79675 Pain in left toe(s): Secondary | ICD-10-CM | POA: Diagnosis not present

## 2021-02-13 DIAGNOSIS — L600 Ingrowing nail: Secondary | ICD-10-CM | POA: Diagnosis not present

## 2021-02-15 ENCOUNTER — Other Ambulatory Visit: Payer: Self-pay | Admitting: Physician Assistant

## 2021-02-15 DIAGNOSIS — K580 Irritable bowel syndrome with diarrhea: Secondary | ICD-10-CM

## 2021-02-19 ENCOUNTER — Encounter: Payer: Self-pay | Admitting: Physician Assistant

## 2021-02-19 DIAGNOSIS — K580 Irritable bowel syndrome with diarrhea: Secondary | ICD-10-CM

## 2021-02-19 MED ORDER — DICYCLOMINE HCL 10 MG PO CAPS: 10.0000 mg | ORAL_CAPSULE | Freq: Two times a day (BID) | ORAL | 1 refills | Status: DC

## 2021-03-15 ENCOUNTER — Encounter: Payer: Self-pay | Admitting: Physician Assistant

## 2021-03-16 ENCOUNTER — Encounter: Payer: Self-pay | Admitting: Physician Assistant

## 2021-03-17 ENCOUNTER — Other Ambulatory Visit: Payer: Self-pay | Admitting: Physician Assistant

## 2021-03-17 DIAGNOSIS — U071 COVID-19: Secondary | ICD-10-CM | POA: Diagnosis not present

## 2021-03-17 DIAGNOSIS — Z20822 Contact with and (suspected) exposure to covid-19: Secondary | ICD-10-CM | POA: Diagnosis not present

## 2021-03-18 ENCOUNTER — Telehealth: Payer: Medicare Other | Admitting: Family

## 2021-03-18 DIAGNOSIS — U071 COVID-19: Secondary | ICD-10-CM | POA: Diagnosis not present

## 2021-03-18 MED ORDER — MOLNUPIRAVIR EUA 200MG CAPSULE: 4.0000 | ORAL_CAPSULE | Freq: Two times a day (BID) | ORAL | 0 refills | Status: AC

## 2021-03-18 MED ORDER — ALBUTEROL SULFATE HFA 108 (90 BASE) MCG/ACT IN AERS: 2.0000 | INHALATION_SPRAY | Freq: Four times a day (QID) | RESPIRATORY_TRACT | 0 refills | Status: DC | PRN

## 2021-03-18 MED ORDER — BENZONATATE 100 MG PO CAPS: 100.0000 mg | ORAL_CAPSULE | Freq: Three times a day (TID) | ORAL | 0 refills | Status: DC | PRN

## 2021-03-18 NOTE — Progress Notes (Signed)
Virtual Visit Consent   Angelica [name], you are scheduled for a virtual visit with a [facility] provider today.     Just as with appointments in the office, your consent must be obtained to participate.  Your consent will be active for this visit and any virtual visit you may have with one of our providers in the next 365 days.     If you have a MyChart account, a copy of this consent can be sent to you electronically.  All virtual visits are billed to your insurance company just like a traditional visit in the office.    As this is a virtual visit, video technology does not allow for your provider to perform a traditional examination.  This may limit your provider's ability to fully assess your condition.  If your provider identifies any concerns that need to be evaluated in person or the need to arrange testing (such as labs, EKG, etc.), we will make arrangements to do so.     Although advances in technology are sophisticated, we cannot ensure that it will always work on either your end or our end.  If the connection with a video visit is poor, the visit may have to be switched to a telephone visit.  With either a video or telephone visit, we are not always able to ensure that we have a secure connection.     I need to obtain your verbal consent now.   Are you willing to proceed with your visit today?    Angelica [name] has provided verbal consent on 03/18/2021 for a virtual visit (video or telephone).   Evelina Dun, FNP   Date: 03/18/2021 4:22 PM   Virtual Visit via Video Note   I, Evelina Dun, connected with  Angelica [name]  ([id number], 70-Jan-1952) on 03/18/21 at  4:15 PM EDT by a video-enabled telemedicine application and verified that I am speaking with the correct person using two identifiers.  Location: Patient: Virtual Visit Location Patient: Home Provider: Virtual Visit Location Provider: Home   I discussed the limitations of evaluation and management by telemedicine and the  availability of in person appointments. The patient expressed understanding and agreed to proceed.    History of Present Illness: Angelica [name] is a 70 y.o. who identifies as a female who was assigned female at birth, and is being seen today for COVID. She reports her symptoms started yesterday and tested positive yesterday. Her husband was positive last week.   HPI: Cough This is a new problem. The current episode started yesterday. The problem has been gradually worsening. The problem occurs constantly. The cough is Non-productive. Associated symptoms include chills, ear congestion, ear pain, headaches, myalgias, nasal congestion, postnasal drip and a sore throat. Pertinent negatives include no fever, shortness of breath or wheezing. She has tried rest (prednisone) for the symptoms. The treatment provided mild relief. Her past medical history is significant for asthma.   Problems:  Patient Active Problem List   Diagnosis Date Noted   Injection site reaction 04/04/2020   Rotator cuff syndrome, left 05/26/2019   Neck fullness 08/31/2018   Colon polyps 03/31/2018   Left-sided nosebleed 03/02/2018   TMJ pain dysfunction syndrome 03/02/2018   Calculus of gallbladder 10/21/2017   Elevated lipase 10/21/2017   Diarrhea 10/21/2017   Upper abdominal pain 10/21/2017   Cellulitis and abscess of buttock 06/04/2017   Essential hypertension 06/11/2016   Epistaxis 11/14/2015   Trapezius muscle spasm 09/28/2015   Elevated blood pressure  04/26/2015   Dyslipidemia (high LDL; low HDL) 03/09/2015   IBS (irritable bowel syndrome) 08/22/2014   Insomnia 03/15/2014   Chondromalacia of right patellofemoral joint 02/10/2014   Anxiety and depression 08/03/2013   Episodic mood disorder ([place]) 10/17/2012   Breast cancer ([place]) 01/31/2011   GAD (generalized anxiety disorder) 01/31/2011   Asthma 01/31/2011   Seasonal allergies 01/31/2011   Hypothyroid 01/31/2011    Allergies:  Allergies  Allergen Reactions    Penicillins Hives, Swelling, Rash and Other (See Comments)    PATIENT HAS HAD A PCN REACTION WITH IMMEDIATE RASH, FACIAL/TONGUE/THROAT SWELLING, SOB, OR LIGHTHEADEDNESS WITH HYPOTENSION:  #  #  YES  #  # Has patient had a PCN reaction causing severe rash involving mucus membranes or skin necrosis: NO PATIENT HAS HAD A PCN REACTION THAT REQUIRED HOSPITALIZATION:  #  #  YES  #  #  Has patient had a PCN reaction occurring within the last 10 years: NO     Red Dye Hives   Sulfa Antibiotics Nausea And Vomiting and Hives   Amitriptyline Other (See Comments)    Crazy dreams     Macrodantin Nausea And Vomiting   Nitrofurantoin Nausea And Vomiting   Medications:  Current Outpatient Medications:    albuterol (VENTOLIN HFA) 108 (90 Base) MCG/ACT inhaler, Inhale 2 puffs into the lungs every 6 (six) hours as needed for wheezing or shortness of breath., Disp: 8 g, Rfl: 0   benzonatate (TESSALON PERLES) 100 MG capsule, Take 1 capsule (100 mg total) by mouth 3 (three) times daily as needed., Disp: 60 capsule, Rfl: 0   molnupiravir EUA (LAGEVRIO) 200 mg CAPS capsule, Take 4 capsules (800 mg total) by mouth 2 (two) times daily for 5 days., Disp: 40 capsule, Rfl: 0   atorvastatin (LIPITOR) 40 MG tablet, Take 1 tablet (40 mg total) by mouth daily at 6 PM., Disp: 90 tablet, Rfl: 3   clonazePAM (KLONOPIN) 0.5 MG tablet, Take 1 tablet (0.5 mg total) by mouth 2 (two) times daily as needed. for anxiety, Disp: 60 tablet, Rfl: 1   dicyclomine (BENTYL) 10 MG capsule, Take 1 capsule (10 mg total) by mouth 2 (two) times daily., Disp: 180 capsule, Rfl: 1   escitalopram (LEXAPRO) 20 MG tablet, TAKE 1 TABLET(20 MG) BY MOUTH DAILY, Disp: 90 tablet, Rfl: 1   hydrochlorothiazide (HYDRODIURIL) 12.5 MG tablet, Take 1 tablet (12.5 mg total) by mouth daily., Disp: 90 tablet, Rfl: 1   lamoTRIgine (LAMICTAL) 100 MG tablet, Take 1 tablet (100 mg total) by mouth 2 (two) times daily., Disp: 180 tablet, Rfl: 1   levothyroxine  (SYNTHROID) 75 MCG tablet, TAKE 1 TABLET(75 MCG) BY MOUTH DAILY BEFORE BREAKFAST/ labs for further refills, Disp: 90 tablet, Rfl: 0   lisinopril (ZESTRIL) 40 MG tablet, TAKE 1 TABLET(40 MG) BY MOUTH DAILY, Disp: 90 tablet, Rfl: 1   temazepam (RESTORIL) 30 MG capsule, Take 1 capsule (30 mg total) by mouth at bedtime as needed for sleep., Disp: 90 capsule, Rfl: 1  Current Facility-Administered Medications:    ipratropium-albuterol (DUONEB) 0.5-2.5 (3) MG/3ML nebulizer solution 3 mL, 3 mL, Nebulization, Q6H, Gregor Hams, MD  Observations/Objective: Patient is well-developed, well-nourished in no acute distress.  Resting comfortably  at home.  Head is normocephalic, atraumatic.  No labored breathing.  Speech is clear and coherent with logical content.  Patient is alert and oriented at baseline.  Nasal congestion   Assessment and Plan: 1. COVID-19 virus detected - molnupiravir EUA (LAGEVRIO) 200 mg CAPS capsule;  Take 4 capsules (800 mg total) by mouth 2 (two) times daily for 5 days.  Dispense: 40 capsule; Refill: 0 - benzonatate (TESSALON PERLES) 100 MG capsule; Take 1 capsule (100 mg total) by mouth 3 (three) times daily as needed.  Dispense: 60 capsule; Refill: 0 - albuterol (VENTOLIN HFA) 108 (90 Base) MCG/ACT inhaler; Inhale 2 puffs into the lungs every 6 (six) hours as needed for wheezing or shortness of breath.  Dispense: 8 g; Refill: 0 COVID positive, rest, force fluids, tylenol as needed, Quarantine for at least 5 days and you are fever free, then must wear a mask out in public from day 7-63, report any worsening symptoms such as increased shortness of breath, swelling, or continued high fevers. Possible adverse effects discussed with antivirals.    Follow Up Instructions: I discussed the assessment and treatment plan with the patient. The patient was provided an opportunity to ask questions and all were answered. The patient agreed with the plan and demonstrated an understanding of the  instructions.  A copy of instructions were sent to the patient via MyChart unless otherwise noted below.    The patient was advised to call back or seek an in-person evaluation if the symptoms worsen or if the condition fails to improve as anticipated.  Time:  I spent 6 minutes with the patient via telehealth technology discussing the above problems/concerns.    Evelina Dun, FNP

## 2021-03-19 ENCOUNTER — Other Ambulatory Visit: Payer: Self-pay | Admitting: Neurology

## 2021-03-19 DIAGNOSIS — I10 Essential (primary) hypertension: Secondary | ICD-10-CM

## 2021-03-19 MED ORDER — LISINOPRIL 40 MG PO TABS: ORAL_TABLET | ORAL | 0 refills | Status: DC

## 2021-03-26 ENCOUNTER — Encounter: Payer: Self-pay | Admitting: Physician Assistant

## 2021-03-27 ENCOUNTER — Encounter: Payer: Self-pay | Admitting: Physician Assistant

## 2021-04-07 ENCOUNTER — Other Ambulatory Visit: Payer: Self-pay | Admitting: Family

## 2021-04-07 DIAGNOSIS — U071 COVID-19: Secondary | ICD-10-CM

## 2021-04-13 ENCOUNTER — Ambulatory Visit (INDEPENDENT_AMBULATORY_CARE_PROVIDER_SITE_OTHER): Payer: Medicare Other | Admitting: Physician Assistant

## 2021-04-13 DIAGNOSIS — Z1231 Encounter for screening mammogram for malignant neoplasm of breast: Secondary | ICD-10-CM | POA: Diagnosis not present

## 2021-04-13 DIAGNOSIS — Z0000 Encounter for general adult medical examination without abnormal findings: Secondary | ICD-10-CM | POA: Diagnosis not present

## 2021-04-13 NOTE — Progress Notes (Signed)
MEDICARE ANNUAL WELLNESS VISIT  04/13/2021  Telephone Visit Disclaimer This Medicare AWV was conducted by telephone due to national recommendations for restrictions regarding the COVID-19 Pandemic (e.g. social distancing).  I verified, using two identifiers, that I am speaking with Angelica [name] or their authorized healthcare agent. I discussed the limitations, risks, security, and privacy concerns of performing an evaluation and management service by telephone and the potential availability of an in-person appointment in the future. The patient expressed understanding and agreed to proceed.  Location of Patient: Home Location of Provider (nurse):  In the  office  Subjective:    Angelica [name] is a 70 y.o. female patient of Donella Stade, PA-C who had a Medicare Annual Wellness Visit today via telephone. Angelica [name] is Retired and lives with her dad, she is his primary caretaker. she has 1 child. she reports that she is socially active and does interact with friends/family regularly. she is minimally physically active and enjoys doing crafts. crafts.  Patient Care Team: Lavada Mesi as PCP - General (Family Medicine)  Advanced Directives 04/13/2021 12/22/2019 12/06/2019 12/21/2018 11/12/2017 11/06/2017 10/13/2017  Does Patient Have a Medical Advance Directive? Yes Yes _0   Type of Advance Directive Living will [place];Living will - - - - -  Does patient want to make changes to medical advance directive? No - Patient declined No - Patient declined - - - - -  Copy of Press photographer in Chart? - No - copy requested - - - - -  Would patient like information on creating a medical advance directive? - - - Yes (MAU/Ambulatory/Procedural Areas - Information given) No - Patient declined No - Patient declined No - Patient declined    Hospital Utilization Over the Past 12 Months: # of hospitalizations or ER visits: 0 # of surgeries: 0  Review of  Systems    Patient reports that her overall health is unchanged compared to last year.  History obtained from chart review and the patient  Patient Reported Readings (BP, Pulse, CBG, Weight, etc) none  Pain Assessment Pain : No/denies pain     Current Medications & Allergies (verified) Allergies as of 04/13/2021       Reactions   Penicillins Hives, Swelling, Rash, Other (See Comments)   PATIENT HAS HAD A PCN REACTION WITH IMMEDIATE RASH, FACIAL/TONGUE/THROAT SWELLING, SOB, OR LIGHTHEADEDNESS WITH HYPOTENSION:  #  #  YES  #  # Has patient had a PCN reaction causing severe rash involving mucus membranes or skin necrosis: NO PATIENT HAS HAD A PCN REACTION THAT REQUIRED HOSPITALIZATION:  #  #  YES  #  #  Has patient had a PCN reaction occurring within the last 10 years: NO   Red Dye Hives   Sulfa Antibiotics Nausea And Vomiting, Hives   Amitriptyline Other (See Comments)   Crazy dreams    Macrodantin Nausea And Vomiting   Nitrofurantoin Nausea And Vomiting        Medication List        Accurate as of April 13, 2021 11:25 AM. If you have any questions, ask your nurse or doctor.          albuterol 108 (90 Base) MCG/ACT inhaler Commonly known as: VENTOLIN HFA Inhale 2 puffs into the lungs every 6 (six) hours as needed for wheezing or shortness of breath.   atorvastatin 40 MG tablet Commonly known as: LIPITOR Take 1 tablet (40 mg total) by  mouth daily at 6 PM.   benzonatate 100 MG capsule Commonly known as: Tessalon Perles Take 1 capsule (100 mg total) by mouth 3 (three) times daily as needed.   clonazePAM 0.5 MG tablet Commonly known as: KLONOPIN Take 1 tablet (0.5 mg total) by mouth 2 (two) times daily as needed. for anxiety   dicyclomine 10 MG capsule Commonly known as: BENTYL Take 1 capsule (10 mg total) by mouth 2 (two) times daily.   escitalopram 20 MG tablet Commonly known as: LEXAPRO TAKE 1 TABLET(20 MG) BY MOUTH DAILY   hydrochlorothiazide 12.5 MG  tablet Commonly known as: HYDRODIURIL Take 1 tablet (12.5 mg total) by mouth daily.   lamoTRIgine 100 MG tablet Commonly known as: LaMICtal Take 1 tablet (100 mg total) by mouth 2 (two) times daily.   levothyroxine 75 MCG tablet Commonly known as: SYNTHROID TAKE 1 TABLET BY MOUTH DIALY BEFORE BREAKFAST/ LABS FOR FURTHER REFILLS   lisinopril 40 MG tablet Commonly known as: ZESTRIL TAKE 1 TABLET(40 MG) BY MOUTH DAILY/ needs appt   temazepam 30 MG capsule Commonly known as: RESTORIL Take 1 capsule (30 mg total) by mouth at bedtime as needed for sleep.        History (reviewed): Past Medical History:  Diagnosis Date   Allergy    Anxiety    Asthma    Breast cancer ([place]) 2009   Depression    Dyspnea    with heavy pollen    Hypertension    Hypothyroidism    Postmenopausal    Thyroid disease    Past Surgical History:  Procedure Laterality Date   ADENOIDECTOMY     APPENDECTOMY     BREAST RECONSTRUCTION  06/2008   CHOLECYSTECTOMY N/A 11/12/2017   Procedure: LAPAROSCOPIC CHOLECYSTECTOMY WITH INTRAOPERATIVE CHOLANGIOGRAM ERAS PATHWAY;  Surgeon: Erroll Luna, MD;  Location: [facility];  Service: General;  Laterality: N/A;   MASTECTOMY  2009   [facility]   TONSILLECTOMY     Family History  Problem Relation Age of Onset   Stroke Mother    Cancer Mother        colon?   Hypertension Father    Hyperlipidemia Father    Social History   Socioeconomic History   Marital status: Married    Spouse name: Sonia Side   Number of children: 1   Years of education: 14   Highest education level: Some college, no degree  Occupational History   Occupation: Web designer: [employer]    Comment: retired  Tobacco Use   Smoking status: Former    Packs/day: 1.50    Years: 45.00    Pack years: 67.50    Types: Cigarettes    Quit date: 06/25/2011    Years since quitting: 9.8   Smokeless tobacco: Never  Vaping Use   Vaping Use: Never used  Substance and Sexual Activity   Alcohol use: No    Drug use: No   Sexual activity: Not Currently  Other Topics Concern   Not on file  Social History Narrative   Currently, lives with her dad, as she is his primary caregiver. He is 70 years old. She has one child. Enjoys doing crafts in her free time.   Social Determinants of Health   Financial Resource Strain: Low Risk    Difficulty of Paying Living Expenses: Not hard at all  Food Insecurity: No Food Insecurity   Worried About Charity fundraiser in the Last Year: Never true   Arboriculturist in  the Last Year: Never true  Transportation Needs: No Transportation Needs   Lack of Transportation (Medical): No   Lack of Transportation (Non-Medical): No  Physical Activity: Inactive   Days of Exercise per Week: 0 days   Minutes of Exercise per Session: 0 min  Stress: No Stress Concern Present   Feeling of Stress : Not at all  Social Connections: Moderately Isolated   Frequency of Communication with Friends and Family: More than three times a week   Frequency of Social Gatherings with Friends and Family: Three times a week   Attends Religious Services: Never   Active Member of Clubs or Organizations: No   Attends Club or Organization Meetings: Never   Marital Status: Married    Activities of Daily Living In your present state of health, do you have any difficulty performing the following activities: 04/13/2021  Hearing? N  Vision? N  Difficulty concentrating or making decisions? N  Walking or climbing stairs? N  Dressing or bathing? N  Doing errands, shopping? N  Preparing Food and eating ? N  Using the Toilet? N  In the past six months, have you accidently leaked urine? N  Do you have problems with loss of bowel control? N  Managing your Medications? N  Managing your Finances? N  Housekeeping or managing your Housekeeping? N  Some recent data might be hidden    Patient Education/ Literacy How often do you need to have someone help you when you read instructions, pamphlets,  or other written materials from your doctor or pharmacy?: 1 - Never What is the last grade level you completed in school?: 2 years of college  Exercise Current Exercise Habits: The patient does not participate in regular exercise at present, Exercise limited by: Other - see comments (currently is a primary caregiver for her 97 year old father.)  Diet Patient reports consuming 2 meals a day and 3 snack(s) a day Patient reports that her primary diet is: Regular Patient reports that she does have regular access to food.   Depression Screen PHQ 2/9 Scores 04/13/2021 10/16/2020 04/10/2020 01/04/2020 12/22/2019 12/03/2019 03/08/2019  PHQ - 2 Score 0 0 0 0 0 2 2  PHQ- 9 Score - 10 5 9 4 20 13     Fall Risk Fall Risk  04/13/2021 01/04/2020 12/22/2019 12/03/2019 12/21/2018  Falls in the past year? 0 1 1 0 0  Number falls in past yr: 0 0 0 0 -  Injury with Fall? 0 0 0 0 -  Risk for fall due to : No Fall Risks History of fall(s) - - -  Follow up Falls evaluation completed Falls evaluation completed Falls evaluation completed Falls evaluation completed Falls prevention discussed     Objective:  Vidya T Bukowski seemed alert and oriented and she participated appropriately during our telephone visit.  Blood Pressure Weight BMI  BP Readings from Last 3 Encounters:  10/16/20 (!) 142/53  04/10/20 122/68  04/04/20 (!) 127/43   Wt Readings from Last 3 Encounters:  10/16/20 168 lb (76.2 kg)  04/10/20 168 lb (76.2 kg)  04/04/20 165 lb (74.8 kg)   BMI Readings from Last 1 Encounters:  10/16/20 28.84 kg/m    *Unable to obtain current vital signs, weight, and BMI due to telephone visit type  Hearing/Vision  Momina did not seem to have difficulty with hearing/understanding during the telephone conversation Reports that she has had a formal eye exam by an eye care professional within the past year Reports that she   has not had a formal hearing evaluation within the past year *Unable to fully assess hearing  and vision during telephone visit type  Cognitive Function: 6CIT Screen 04/13/2021 12/22/2019 12/21/2018  What Year? 0 points 0 points 0 points  What month? 0 points 0 points 0 points  What time? 0 points 0 points 0 points  Count back from 20 0 points 0 points 0 points  Months in reverse 0 points 0 points 0 points  Repeat phrase 0 points 0 points 0 points  Total Score 0 0 0   (Normal:0-7, Significant for Dysfunction: >8)  Normal Cognitive Function Screening: Yes   Immunization & Health Maintenance Record Immunization History  Administered Date(s) Administered   Influenza Split 06/24/2012   Influenza Whole 03/29/2009   Influenza, High Dose Seasonal PF 02/26/2017, 04/02/2018   Influenza,inj,Quad PF,6+ Mos 03/14/2014, 03/08/2015, 05/14/2016, 03/02/2019   Influenza,trivalent, recombinat, inj, PF 05/29/2012   Influenza-Unspecified 02/26/2017, 04/02/2018, 04/02/2020   Moderna Sars-Covid-2 Vaccination 08/07/2019, 09/04/2019, 05/27/2020   Pneumococcal Conjugate-13 02/26/2017   Pneumococcal Polysaccharide-23 05/29/2012, 06/24/2012, 08/31/2018   Pneumococcal-Unspecified 02/26/2017, 04/02/2020   Tdap 01/31/2011   Zoster, Live 01/31/2011    Health Maintenance  Topic Date Due   INFLUENZA VACCINE  04/15/2021 (Originally 01/22/2021)   COVID-19 Vaccine (4 - Booster for Moderna series) 04/29/2021 (Originally 07/22/2020)   Zoster Vaccines- Shingrix (1 of 2) 07/14/2021 (Originally 09/25/1969)   TETANUS/TDAP  04/13/2022 (Originally 01/30/2021)   MAMMOGRAM  04/27/2022   COLONOSCOPY (Pts 45-49yrs Insurance coverage will need to be confirmed)  03/31/2023   Pneumonia Vaccine 65+ Years old  Completed   DEXA SCAN  Completed   Hepatitis C Screening  Completed   HPV VACCINES  Aged Out       Assessment  This is a routine wellness examination for Oberia T Piscitelli.  Health Maintenance: Due or Overdue There are no preventive care reminders to display for this patient.   Maebry T Ruta does not need a  referral for Community Assistance: Care Management:   no Social Work:    no Prescription Assistance:  no Nutrition/Diabetes Education:  no   Plan:  Personalized Goals  Goals Addressed               This Visit's Progress     Patient Stated (pt-stated)        04/13/2021 AWV Goal: Exercise for General Health  Patient will verbalize understanding of the benefits of increased physical activity: Exercising regularly is important. It will improve your overall fitness, flexibility, and endurance. Regular exercise also will improve your overall health. It can help you control your weight, reduce stress, and improve your bone density. Over the next year, patient will increase physical activity as tolerated with a goal of at least 150 minutes of moderate physical activity per week.  You can tell that you are exercising at a moderate intensity if your heart starts beating faster and you start breathing faster but can still hold a conversation. Moderate-intensity exercise ideas include: Walking 1 mile (1.6 km) in about 15 minutes Biking Hiking Golfing Dancing Water aerobics Patient will verbalize understanding of everyday activities that increase physical activity by providing examples like the following: Yard work, such as: Pushing a lawn mower Raking and bagging leaves Washing your car Pushing a stroller Shoveling snow Gardening Washing windows or floors Patient will be able to explain general safety guidelines for exercising:  Before you start a new exercise program, talk with your health care provider. Do not exercise so much that you   hurt yourself, feel dizzy, or get very short of breath. Wear comfortable clothes and wear shoes with good support. Drink plenty of water while you exercise to prevent dehydration or heat stroke. Work out until your breathing and your heartbeat get faster.        Personalized Health Maintenance & Screening Recommendations  Influenza vaccine Td  vaccine Shingrix Mammogram will be due in November.  Lung Cancer Screening Recommended: yes; but patient declined at this time. (Low Dose CT Chest recommended if Age 35-80 years, 30 pack-year currently smoking OR have quit w/in past 15 years) Hepatitis C Screening recommended: no HIV Screening recommended: no  Advanced Directives: Written information was not prepared per patient's request.  Referrals & Orders Orders Placed This Encounter  Procedures   Mammogram 3D SCREEN BREAST BILATERAL     Follow-up Plan Follow-up with Donella Stade, PA-C as planned Schedule your tetanus shot and shingles shot at the pharmacy.  Flu shot can be done in-office at your appointment on Monday. Medicare wellness visit in one year. AVS printed and mailed to the patient.   I have personally reviewed and noted the following in the patient's chart:   Medical and social history Use of alcohol, tobacco or illicit drugs  Current medications and supplements Functional ability and status Nutritional status Physical activity Advanced directives List of other physicians Hospitalizations, surgeries, and ER visits in previous 12 months Vitals Screenings to include cognitive, depression, and falls Referrals and appointments  In addition, I have reviewed and discussed with Angelica [name] certain preventive protocols, quality metrics, and best practice recommendations. A written personalized care plan for preventive services as well as general preventive health recommendations is available and can be mailed to the patient at her request.      Tinnie Gens, RN  04/13/2021

## 2021-04-13 NOTE — Patient Instructions (Addendum)
[name] Maintenance Summary and Written Plan of Care  Angelica [name] ,  Thank you for allowing me to perform your Medicare Annual Wellness Visit and for your ongoing commitment to your health.   Health Maintenance & Immunization History Health Maintenance  Topic Date Due   INFLUENZA VACCINE  04/15/2021 (Originally 01/22/2021)   COVID-19 Vaccine (4 - Booster for Moderna series) 04/29/2021 (Originally 07/22/2020)   Zoster Vaccines- Shingrix (1 of 2) 07/14/2021 (Originally 09/25/1969)   TETANUS/TDAP  04/13/2022 (Originally 01/30/2021)   MAMMOGRAM  04/27/2022   COLONOSCOPY (Pts 45-36yrs Insurance coverage will need to be confirmed)  03/31/2023   Pneumonia Vaccine 69+ Years old  Completed   DEXA SCAN  Completed   Hepatitis C Screening  Completed   HPV VACCINES  Aged Out   Immunization History  Administered Date(s) Administered   Influenza Split 06/24/2012   Influenza Whole 03/29/2009   Influenza, High Dose Seasonal PF 02/26/2017, 04/02/2018   Influenza,inj,Quad PF,6+ Mos 03/14/2014, 03/08/2015, 05/14/2016, 03/02/2019   Influenza,trivalent, recombinat, inj, PF 05/29/2012   Influenza-Unspecified 02/26/2017, 04/02/2018, 04/02/2020   Moderna Sars-Covid-2 Vaccination 08/07/2019, 09/04/2019, 05/27/2020   Pneumococcal Conjugate-13 02/26/2017   Pneumococcal Polysaccharide-23 05/29/2012, 06/24/2012, 08/31/2018   Pneumococcal-Unspecified 02/26/2017, 04/02/2020   Tdap 01/31/2011   Zoster, Live 01/31/2011    These are the patient goals that we discussed:  Goals Addressed               This Visit's Progress     Patient Stated (pt-stated)        04/13/2021 AWV Goal: Exercise for General Health  Patient will verbalize understanding of the benefits of increased physical activity: Exercising regularly is important. It will improve your overall fitness, flexibility, and endurance. Regular exercise also will improve your overall health. It can help you control your  weight, reduce stress, and improve your bone density. Over the next year, patient will increase physical activity as tolerated with a goal of at least 150 minutes of moderate physical activity per week.  You can tell that you are exercising at a moderate intensity if your heart starts beating faster and you start breathing faster but can still hold a conversation. Moderate-intensity exercise ideas include: Walking 1 mile (1.6 km) in about 15 minutes Biking Hiking Golfing Dancing Water aerobics Patient will verbalize understanding of everyday activities that increase physical activity by providing examples like the following: Yard work, such as: Sales promotion account executive Gardening Washing windows or floors Patient will be able to explain general safety guidelines for exercising:  Before you start a new exercise program, talk with your health care provider. Do not exercise so much that you hurt yourself, feel dizzy, or get very short of breath. Wear comfortable clothes and wear shoes with good support. Drink plenty of water while you exercise to prevent dehydration or heat stroke. Work out until your breathing and your heartbeat get faster.          This is a list of Health Maintenance Items that are overdue or due now: Influenza vaccine Td vaccine Shingrix Mammogram will be due in November.  Orders/Referrals Placed Today: Orders Placed This Encounter  Procedures   Mammogram 3D SCREEN BREAST BILATERAL    Standing Status:   Future    Standing Expiration Date:   04/13/2022    Scheduling Instructions:     Please call patient to schedule. She will be due after 04/27/21.  Order Specific Question:   Reason for Exam (SYMPTOM  OR DIAGNOSIS REQUIRED)    Answer:   breast cancer screening    Order Specific Question:   Preferred imaging location?    Answer:   [facility]    (Contact our referral  department at [phone] if you have not spoken with someone about your referral appointment within the next 5 days)    Follow-up Plan Follow-up with Donella Stade, PA-C as planned Schedule your tetanus shot and shingles shot at the pharmacy.  Flu shot can be done in-office at your appointment on Monday. Medicare wellness visit in one year. AVS printed and mailed to the patient.      Health Maintenance, Female Adopting a healthy lifestyle and getting preventive care are important in promoting health and wellness. Ask your health care provider about: The right schedule for you to have regular tests and exams. Things you can do on your own to prevent diseases and keep yourself healthy. What should I know about diet, weight, and exercise? Eat a healthy diet  Eat a diet that includes plenty of vegetables, fruits, low-fat dairy products, and lean protein. Do not eat a lot of foods that are high in solid fats, added sugars, or sodium. Maintain a healthy weight Body mass index (BMI) is used to identify weight problems. It estimates body fat based on height and weight. Your health care provider can help determine your BMI and help you achieve or maintain a healthy weight. Get regular exercise Get regular exercise. This is one of the most important things you can do for your health. Most adults should: Exercise for at least 150 minutes each week. The exercise should increase your heart rate and make you sweat (moderate-intensity exercise). Do strengthening exercises at least twice a week. This is in addition to the moderate-intensity exercise. Spend less time sitting. Even light physical activity can be beneficial. Watch cholesterol and blood lipids Have your blood tested for lipids and cholesterol at 70 years of age, then have this test every 5 years. Have your cholesterol levels checked more often if: Your lipid or cholesterol levels are high. You are older than 71 years of age. You  are at high risk for heart disease. What should I know about cancer screening? Depending on your health history and family history, you may need to have cancer screening at various ages. This may include screening for: Breast cancer. Cervical cancer. Colorectal cancer. Skin cancer. Lung cancer. What should I know about heart disease, diabetes, and high blood pressure? Blood pressure and heart disease High blood pressure causes heart disease and increases the risk of stroke. This is more likely to develop in people who have high blood pressure readings, are of African descent, or are overweight. Have your blood pressure checked: Every 3-5 years if you are 49-65 years of age. Every year if you are 29 years old or older. Diabetes Have regular diabetes screenings. This checks your fasting blood sugar level. Have the screening done: Once every three years after age 54 if you are at a normal weight and have a low risk for diabetes. More often and at a younger age if you are overweight or have a high risk for diabetes. What should I know about preventing infection? Hepatitis B If you have a higher risk for hepatitis B, you should be screened for this virus. Talk with your health care provider to find out if you are at risk for hepatitis B infection. Hepatitis C Testing  is recommended for: Everyone born from [birth year] through 1965. Anyone with known risk factors for hepatitis C. Sexually transmitted infections (STIs) Get screened for STIs, including gonorrhea and chlamydia, if: You are sexually active and are younger than 70 years of age. You are older than 70 years of age and your health care provider tells you that you are at risk for this type of infection. Your sexual activity has changed since you were last screened, and you are at increased risk for chlamydia or gonorrhea. Ask your health care provider if you are at risk. Ask your health care provider about whether you are at high risk for  HIV. Your health care provider may recommend a prescription medicine to help prevent HIV infection. If you choose to take medicine to prevent HIV, you should first get tested for HIV. You should then be tested every 3 months for as long as you are taking the medicine. Pregnancy If you are about to stop having your period (premenopausal) and you may become pregnant, seek counseling before you get pregnant. Take 400 to 800 micrograms (mcg) of folic acid every day if you become pregnant. Ask for birth control (contraception) if you want to prevent pregnancy. Osteoporosis and menopause Osteoporosis is a disease in which the bones lose minerals and strength with aging. This can result in bone fractures. If you are 75 years old or older, or if you are at risk for osteoporosis and fractures, ask your health care provider if you should: Be screened for bone loss. Take a calcium or vitamin D supplement to lower your risk of fractures. Be given hormone replacement therapy (HRT) to treat symptoms of menopause. Follow these instructions at home: Lifestyle Do not use any products that contain nicotine or tobacco, such as cigarettes, e-cigarettes, and chewing tobacco. If you need help quitting, ask your health care provider. Do not use street drugs. Do not share needles. Ask your health care provider for help if you need support or information about quitting drugs. Alcohol use Do not drink alcohol if: Your health care provider tells you not to drink. You are pregnant, may be pregnant, or are planning to become pregnant. If you drink alcohol: Limit how much you use to 0-1 drink a day. Limit intake if you are breastfeeding. Be aware of how much alcohol is in your drink. In the [place] one drink equals one 12 oz bottle of beer (355 mL), one 5 oz glass of wine (148 mL), or one 1 oz glass of hard liquor (44 mL). General instructions Schedule regular health, dental, and eye exams. Stay current with your  vaccines. Tell your health care provider if: You often feel depressed. You have ever been abused or do not feel safe at home. Summary Adopting a healthy lifestyle and getting preventive care are important in promoting health and wellness. Follow your health care provider's instructions about healthy diet, exercising, and getting tested or screened for diseases. Follow your health care provider's instructions on monitoring your cholesterol and blood pressure. This information is not intended to replace advice given to you by your health care provider. Make sure you discuss any questions you have with your health care provider. Document Revised: 08/18/2020 Document Reviewed: 06/03/2018 Elsevier Patient Education  2022 [employer].

## 2021-04-16 ENCOUNTER — Other Ambulatory Visit: Payer: Self-pay | Admitting: Physician Assistant

## 2021-04-16 NOTE — Telephone Encounter (Signed)
Patient has a 6 month follow up appt scheduled tomorrow with [name]. AM

## 2021-04-16 NOTE — Telephone Encounter (Signed)
Two weeks of thyroid medication sent to pharmacy. Patient needs appt and labs with [facility]. Please call and schedule follow up. Thanks!

## 2021-04-17 ENCOUNTER — Ambulatory Visit (INDEPENDENT_AMBULATORY_CARE_PROVIDER_SITE_OTHER): Payer: Medicare Other | Admitting: Physician Assistant

## 2021-04-17 ENCOUNTER — Other Ambulatory Visit: Payer: Self-pay

## 2021-04-17 ENCOUNTER — Encounter: Payer: Self-pay | Admitting: Physician Assistant

## 2021-04-17 VITALS — BP 131/50 | HR 72 | Temp 98.3°F | Ht 64.0 in | Wt 163.0 lb

## 2021-04-17 DIAGNOSIS — F39 Unspecified mood [affective] disorder: Secondary | ICD-10-CM

## 2021-04-17 DIAGNOSIS — Z23 Encounter for immunization: Secondary | ICD-10-CM

## 2021-04-17 DIAGNOSIS — F32A Depression, unspecified: Secondary | ICD-10-CM | POA: Diagnosis not present

## 2021-04-17 DIAGNOSIS — F419 Anxiety disorder, unspecified: Secondary | ICD-10-CM | POA: Diagnosis not present

## 2021-04-17 DIAGNOSIS — I10 Essential (primary) hypertension: Secondary | ICD-10-CM

## 2021-04-17 DIAGNOSIS — E039 Hypothyroidism, unspecified: Secondary | ICD-10-CM

## 2021-04-17 DIAGNOSIS — Z13820 Encounter for screening for osteoporosis: Secondary | ICD-10-CM

## 2021-04-17 DIAGNOSIS — E785 Hyperlipidemia, unspecified: Secondary | ICD-10-CM

## 2021-04-17 MED ORDER — ESCITALOPRAM OXALATE 20 MG PO TABS: ORAL_TABLET | ORAL | 1 refills | Status: DC

## 2021-04-17 MED ORDER — LAMOTRIGINE 100 MG PO TABS: 100.0000 mg | ORAL_TABLET | Freq: Two times a day (BID) | ORAL | 1 refills | Status: DC

## 2021-04-17 MED ORDER — LISINOPRIL 40 MG PO TABS: ORAL_TABLET | ORAL | 1 refills | Status: DC

## 2021-04-17 MED ORDER — HYDROCHLOROTHIAZIDE 12.5 MG PO TABS: 12.5000 mg | ORAL_TABLET | Freq: Every day | ORAL | 1 refills | Status: DC

## 2021-04-17 NOTE — Progress Notes (Signed)
Subjective:    Patient ID: Angelica [name], female    DOB: [birth year], 70 y.o.   MRN: [id number]  HPI Patient is a 70 year old female with hypertension, dyslipidemia asthma, IBS, hypothyroidism, mood disorder, anxiety who presents to the clinic for follow-up.  Patient is doing really well.  Her mood is much better.  She denies any suicidal thoughts or homicidal idealizations.  She is very compliant with her medication.  Patient denies any chest pain, palpitations, headaches, vision changes.  She is not checking her blood pressure at home.    .. Active Ambulatory Problems    Diagnosis Date Noted   Breast cancer ([place]) 01/31/2011   GAD (generalized anxiety disorder) 01/31/2011   Asthma 01/31/2011   Seasonal allergies 01/31/2011   Hypothyroid 01/31/2011   Episodic mood disorder ([place]) 10/17/2012   Anxiety and depression 08/03/2013   Chondromalacia of right patellofemoral joint 02/10/2014   Insomnia 03/15/2014   IBS (irritable bowel syndrome) 08/22/2014   Dyslipidemia (high LDL; low HDL) 03/09/2015   Elevated blood pressure 04/26/2015   Trapezius muscle spasm 09/28/2015   Epistaxis 11/14/2015   Essential hypertension 06/11/2016   Cellulitis and abscess of buttock 06/04/2017   Calculus of gallbladder 10/21/2017   Elevated lipase 10/21/2017   Diarrhea 10/21/2017   Upper abdominal pain 10/21/2017   Left-sided nosebleed 03/02/2018   TMJ pain dysfunction syndrome 03/02/2018   Colon polyps 03/31/2018   Neck fullness 08/31/2018   Rotator cuff syndrome, left 05/26/2019   Injection site reaction 04/04/2020   Resolved Ambulatory Problems    Diagnosis Date Noted   OAB (overactive bladder) 03/08/2015   Vitamin D deficiency 03/09/2015   Impingement syndrome of left shoulder 08/28/2015   Acute upper respiratory infection 12/15/2015   Past Medical History:  Diagnosis Date   Allergy    Anxiety    Asthma    Depression    Dyspnea    Hypertension    Hypothyroidism    Postmenopausal     Thyroid disease      Review of Systems  All other systems reviewed and are negative.     Objective:   Physical Exam Vitals reviewed.  Constitutional:      Appearance: Normal appearance.  HENT:     Head: Normocephalic.  Cardiovascular:     Rate and Rhythm: Normal rate and regular rhythm.     Pulses: Normal pulses.     Heart sounds: Normal heart sounds.  Neurological:     General: No focal deficit present.     Mental Status: She is alert and oriented to person, place, and time.  Psychiatric:        Mood and Affect: Mood normal.   .. Depression screen [facility] 2/9 04/13/2021 10/16/2020 04/10/2020 01/04/2020 12/22/2019  Decreased Interest 0 0 0 0 0  Down, Depressed, Hopeless 0 0 0 0 0  PHQ - 2 Score 0 0 0 0 0  Altered sleeping - 2 0 2 1  Tired, decreased energy - 2 2 3 1   Change in appetite - 2 0 3 1  Feeling bad or failure about yourself  - 2 0 0 0  Trouble concentrating - 2 3 1  -  Moving slowly or fidgety/restless - 0 0 0 1  Suicidal thoughts - 0 0 0 0  PHQ-9 Score - 10 5 9 4   Difficult doing work/chores - Somewhat difficult Somewhat difficult - Not difficult at all  Some recent data might be hidden          Assessment &  Plan:  ..Dinisha was seen today for follow-up.  Diagnoses and all orders for this visit:  Essential hypertension -     hydrochlorothiazide (HYDRODIURIL) 12.5 MG tablet; Take 1 tablet (12.5 mg total) by mouth daily. -     lisinopril (ZESTRIL) 40 MG tablet; TAKE 1 TABLET(40 MG) BY MOUTH DAILY. -     COMPLETE METABOLIC PANEL WITH GFR  Anxiety and depression -     escitalopram (LEXAPRO) 20 MG tablet; TAKE 1 TABLET(20 MG) BY MOUTH DAILY -     lamoTRIgine (LAMICTAL) 100 MG tablet; Take 1 tablet (100 mg total) by mouth 2 (two) times daily.  Episodic mood disorder (HCC) -     lamoTRIgine (LAMICTAL) 100 MG tablet; Take 1 tablet (100 mg total) by mouth 2 (two) times daily.  Dyslipidemia (high LDL; low HDL) -     Lipid Panel w/reflex Direct LDL  Acquired  hypothyroidism -     TSH  Osteoporosis screening -     DG Bone Density; Future  BP to goal. Refilled lisinopril.  Mood controlled refilled today.  Fasting labs ordered.  TSH ordered to refill medication.  Mammogram and bone density ordered.  Flu shot given today.  Just had covid infection. 3 months to get booster.

## 2021-04-18 LAB — COMPLETE METABOLIC PANEL WITH GFR
AG Ratio: 2 (calc) (ref 1.0–2.5)
ALT: 10 U/L (ref 6–29)
AST: 16 U/L (ref 10–35)
Albumin: 4.4 g/dL (ref 3.6–5.1)
Alkaline phosphatase (APISO): 106 U/L (ref 37–153)
BUN: 13 mg/dL (ref 7–25)
CO2: 29 mmol/L (ref 20–32)
Calcium: 9.5 mg/dL (ref 8.6–10.4)
Chloride: 104 mmol/L (ref 98–110)
Creat: 0.88 mg/dL (ref 0.60–1.00)
Globulin: 2.2 g/dL (calc) (ref 1.9–3.7)
Glucose, Bld: 93 mg/dL (ref 65–99)
Potassium: 3.9 mmol/L (ref 3.5–5.3)
Sodium: 142 mmol/L (ref 135–146)
Total Bilirubin: 0.6 mg/dL (ref 0.2–1.2)
Total Protein: 6.6 g/dL (ref 6.1–8.1)
eGFR: 71 mL/min/{1.73_m2} (ref >=60)

## 2021-04-18 LAB — LIPID PANEL W/REFLEX DIRECT LDL
Cholesterol: 161 mg/dL (ref <200)
HDL: 36 mg/dL — ABNORMAL LOW (ref >=50)
LDL Cholesterol (Calc): 99 mg/dL (calc)
Non-HDL Cholesterol (Calc): 125 mg/dL (calc) (ref <130)
Total CHOL/HDL Ratio: 4.5 (calc) (ref <5.0)
Triglycerides: 164 mg/dL — ABNORMAL HIGH (ref <150)

## 2021-04-18 LAB — TSH: TSH: 0.65 mIU/L (ref 0.40–4.50)

## 2021-04-18 NOTE — Progress Notes (Signed)
Angelica [name],   Thyroid looks great.  Kidney, liver, glucose look good.  LDL, bad cholesterol to goal.  HDL, good cholesterol is low.  TG are elevated.  Are you taking any fish oil? If not start 4000mg  a day.  Watch sugars/fried/processed foods.

## 2021-05-08 ENCOUNTER — Ambulatory Visit
Admission: RE | Admit: 2021-05-08 | Discharge: 2021-05-08 | Disposition: A | Discharge status: Home / Self Care | Payer: Medicare Other | Source: Ambulatory Visit | Attending: Physician Assistant | Admitting: Physician Assistant

## 2021-05-08 ENCOUNTER — Other Ambulatory Visit: Payer: Self-pay

## 2021-05-08 DIAGNOSIS — Z1231 Encounter for screening mammogram for malignant neoplasm of breast: Secondary | ICD-10-CM

## 2021-05-08 DIAGNOSIS — Z0000 Encounter for general adult medical examination without abnormal findings: Secondary | ICD-10-CM

## 2021-05-08 NOTE — Progress Notes (Signed)
Normal mammogram. Follow up in 1 year.

## 2021-05-09 ENCOUNTER — Ambulatory Visit (INDEPENDENT_AMBULATORY_CARE_PROVIDER_SITE_OTHER): Payer: Medicare Other

## 2021-05-09 DIAGNOSIS — M85852 Other specified disorders of bone density and structure, left thigh: Secondary | ICD-10-CM | POA: Diagnosis not present

## 2021-05-09 DIAGNOSIS — Z780 Asymptomatic menopausal state: Secondary | ICD-10-CM | POA: Diagnosis not present

## 2021-05-09 DIAGNOSIS — Z13820 Encounter for screening for osteoporosis: Secondary | ICD-10-CM

## 2021-05-10 ENCOUNTER — Encounter: Payer: Self-pay | Admitting: Physician Assistant

## 2021-05-10 DIAGNOSIS — M8580 Other specified disorders of bone density and structure, unspecified site: Secondary | ICD-10-CM | POA: Insufficient documentation

## 2021-05-10 NOTE — Progress Notes (Signed)
Osteopenic, low bone mass. Continue vitamin D and calcium daily. Recheck in 2 years.

## 2021-06-25 ENCOUNTER — Other Ambulatory Visit: Payer: Self-pay | Admitting: Physician Assistant

## 2021-06-26 ENCOUNTER — Encounter: Payer: Self-pay | Admitting: Physician Assistant

## 2021-06-26 MED ORDER — LEVOTHYROXINE SODIUM 75 MCG PO TABS: ORAL_TABLET | ORAL | 1 refills | Status: DC

## 2021-06-26 NOTE — Addendum Note (Signed)
Addended by: Fonnie Mu on: 06/26/2021 01:37 PM   Modules accepted: Orders

## 2021-07-15 ENCOUNTER — Encounter: Payer: Self-pay | Admitting: Physician Assistant

## 2021-07-15 DIAGNOSIS — F419 Anxiety disorder, unspecified: Secondary | ICD-10-CM

## 2021-07-15 DIAGNOSIS — F32A Depression, unspecified: Secondary | ICD-10-CM

## 2021-07-16 MED ORDER — CLONAZEPAM 0.5 MG PO TABS: 0.5000 mg | ORAL_TABLET | Freq: Two times a day (BID) | ORAL | 1 refills | Status: DC | PRN

## 2021-07-20 ENCOUNTER — Emergency Department: Payer: Medicare Other

## 2021-07-20 ENCOUNTER — Other Ambulatory Visit: Payer: Self-pay

## 2021-07-20 ENCOUNTER — Emergency Department (INDEPENDENT_AMBULATORY_CARE_PROVIDER_SITE_OTHER): Payer: Medicare Other

## 2021-07-20 ENCOUNTER — Emergency Department (INDEPENDENT_AMBULATORY_CARE_PROVIDER_SITE_OTHER)
Admission: EM | Admit: 2021-07-20 | Discharge: 2021-07-20 | Disposition: A | Discharge status: Home / Self Care | Payer: Medicare Other | Source: Home / Self Care

## 2021-07-20 ENCOUNTER — Encounter: Payer: Self-pay | Admitting: Emergency Medicine

## 2021-07-20 ENCOUNTER — Telehealth: Payer: Self-pay | Admitting: Emergency Medicine

## 2021-07-20 DIAGNOSIS — R0602 Shortness of breath: Secondary | ICD-10-CM | POA: Diagnosis not present

## 2021-07-20 DIAGNOSIS — R059 Cough, unspecified: Secondary | ICD-10-CM | POA: Diagnosis not present

## 2021-07-20 DIAGNOSIS — J309 Allergic rhinitis, unspecified: Secondary | ICD-10-CM

## 2021-07-20 DIAGNOSIS — R062 Wheezing: Secondary | ICD-10-CM

## 2021-07-20 LAB — POC SARS CORONAVIRUS 2 AG -  ED: SARS Coronavirus 2 Ag: NEGATIVE

## 2021-07-20 MED ORDER — DOXYCYCLINE HYCLATE 100 MG PO CAPS: 100.0000 mg | ORAL_CAPSULE | Freq: Two times a day (BID) | ORAL | 0 refills | Status: AC

## 2021-07-20 MED ORDER — BENZONATATE 100 MG PO CAPS: 100.0000 mg | ORAL_CAPSULE | Freq: Three times a day (TID) | ORAL | 0 refills | Status: DC

## 2021-07-20 MED ORDER — DOXYCYCLINE HYCLATE 100 MG PO CAPS: 100.0000 mg | ORAL_CAPSULE | Freq: Two times a day (BID) | ORAL | 0 refills | Status: DC

## 2021-07-20 MED ORDER — PREDNISONE 20 MG PO TABS: ORAL_TABLET | ORAL | 0 refills | Status: DC

## 2021-07-20 MED ORDER — PREDNISONE 10 MG PO TABS: 20.0000 mg | ORAL_TABLET | Freq: Every day | ORAL | 0 refills | Status: DC

## 2021-07-20 MED ORDER — FEXOFENADINE HCL 60 MG PO TABS: 60.0000 mg | ORAL_TABLET | Freq: Two times a day (BID) | ORAL | 0 refills | Status: DC

## 2021-07-20 MED ORDER — BENZONATATE 200 MG PO CAPS: 200.0000 mg | ORAL_CAPSULE | Freq: Three times a day (TID) | ORAL | 0 refills | Status: DC | PRN

## 2021-07-20 MED ORDER — FEXOFENADINE HCL 180 MG PO TABS: 180.0000 mg | ORAL_TABLET | Freq: Every day | ORAL | 0 refills | Status: DC

## 2021-07-20 NOTE — ED Provider Notes (Signed)
[name]    CSN: [id number] Arrival date & time: 07/20/21  1340      History   Chief Complaint Chief Complaint  Patient presents with   Cough    HPI Angelica [name] is a 71 y.o. female.   HPI 71 year old female presents with congestion with wheezing per patient for 2 days.  Reports negative home COVID-19 test 2 days ago.  Reports husband is sick also and currently on antibiotics.  Patient reports PCP advised her to use albuterol inhaler at home and then to come to urgent care.  PMH significant for asthma, seasonal allergies, HTN, and breast cancer.  Past Medical History:  Diagnosis Date   Allergy    Anxiety    Asthma    Breast cancer ([facility]) 2009   Depression    Dyspnea    with heavy pollen    Hypertension    Hypothyroidism    Postmenopausal    Thyroid disease     Patient Active Problem List   Diagnosis Date Noted   Osteopenia 05/10/2021   Injection site reaction 04/04/2020   Rotator cuff syndrome, left 05/26/2019   Neck fullness 08/31/2018   Colon polyps 03/31/2018   Left-sided nosebleed 03/02/2018   TMJ pain dysfunction syndrome 03/02/2018   Calculus of gallbladder 10/21/2017   Elevated lipase 10/21/2017   Diarrhea 10/21/2017   Upper abdominal pain 10/21/2017   Cellulitis and abscess of buttock 06/04/2017   Essential hypertension 06/11/2016   Epistaxis 11/14/2015   Trapezius muscle spasm 09/28/2015   Elevated blood pressure 04/26/2015   Dyslipidemia (high LDL; low HDL) 03/09/2015   IBS (irritable bowel syndrome) 08/22/2014   Insomnia 03/15/2014   Chondromalacia of right patellofemoral joint 02/10/2014   Anxiety and depression 08/03/2013   Episodic mood disorder ([place]) 10/17/2012   Breast cancer ([place]) 01/31/2011   GAD (generalized anxiety disorder) 01/31/2011   Asthma 01/31/2011   Seasonal allergies 01/31/2011   Hypothyroid 01/31/2011    Past Surgical History:  Procedure Laterality Date   ADENOIDECTOMY     APPENDECTOMY     BREAST  RECONSTRUCTION  06/2008   CHOLECYSTECTOMY N/A 11/12/2017   Procedure: LAPAROSCOPIC CHOLECYSTECTOMY WITH INTRAOPERATIVE CHOLANGIOGRAM ERAS PATHWAY;  Surgeon: Erroll Luna, MD;  Location: [facility];  Service: General;  Laterality: N/A;   MASTECTOMY  2009   [facility]   TONSILLECTOMY      OB History   No obstetric history on file.      Home Medications    Prior to Admission medications   Medication Sig Start Date End Date Taking? Authorizing Provider  benzonatate (TESSALON) 200 MG capsule Take 1 capsule (200 mg total) by mouth 3 (three) times daily as needed for up to 7 days for cough. 07/20/21 07/27/21 Yes Eliezer Lofts, FNP  doxycycline (VIBRAMYCIN) 100 MG capsule Take 1 capsule (100 mg total) by mouth 2 (two) times daily for 7 days. 07/20/21 07/27/21 Yes Eliezer Lofts, FNP  fexofenadine [facility] ALLERGY) 180 MG tablet Take 1 tablet (180 mg total) by mouth daily for 15 days. 07/20/21 08/04/21 Yes Eliezer Lofts, FNP  predniSONE (DELTASONE) 20 MG tablet Take 3 tabs PO daily x 5 days. 07/20/21  Yes Eliezer Lofts, FNP  albuterol (VENTOLIN HFA) 108 (90 Base) MCG/ACT inhaler Inhale 2 puffs into the lungs every 6 (six) hours as needed for wheezing or shortness of breath. 03/18/21   Sharion Balloon, FNP  atorvastatin (LIPITOR) 40 MG tablet Take 1 tablet (40 mg total) by mouth daily at 6 PM. 12/19/20  Breeback, [name] L, PA-C  clonazePAM (KLONOPIN) 0.5 MG tablet Take 1 tablet (0.5 mg total) by mouth 2 (two) times daily as needed. for anxiety 07/16/21   Iran Planas L, PA-C  dicyclomine (BENTYL) 10 MG capsule Take 1 capsule (10 mg total) by mouth 2 (two) times daily. 02/19/21   Breeback, [name] L, PA-C  escitalopram (LEXAPRO) 20 MG tablet TAKE 1 TABLET(20 MG) BY MOUTH DAILY 04/17/21   Breeback, [name] L, PA-C  hydrochlorothiazide (HYDRODIURIL) 12.5 MG tablet Take 1 tablet (12.5 mg total) by mouth daily. 04/17/21   Breeback, Royetta Car, PA-C  lamoTRIgine (LAMICTAL) 100 MG tablet Take 1 tablet (100 mg total) by mouth 2 (two)  times daily. 04/17/21   Breeback, Royetta Car, PA-C  levothyroxine (SYNTHROID) 75 MCG tablet TAKE 1 TABLET BY MOUTH DAILY BEFORE BREAFAST 06/26/21   Breeback, [name] L, PA-C  lisinopril (ZESTRIL) 40 MG tablet TAKE 1 TABLET(40 MG) BY MOUTH DAILY. 04/17/21   Breeback, [name] L, PA-C  temazepam (RESTORIL) 30 MG capsule Take 1 capsule (30 mg total) by mouth at bedtime as needed for sleep. 03/08/19   Donella Stade, PA-C    Family History Family History  Problem Relation Age of Onset   Stroke Mother    Cancer Mother        colon?   Hypertension Father    Hyperlipidemia Father     Social History Social History   Tobacco Use   Smoking status: Former    Packs/day: 1.50    Years: 45.00    Pack years: 67.50    Types: Cigarettes    Quit date: 06/25/2011    Years since quitting: 10.0   Smokeless tobacco: Never  Vaping Use   Vaping Use: Never used  Substance Use Topics   Alcohol use: No   Drug use: No     Allergies   Penicillins, Red dye, Sulfa antibiotics, Amitriptyline, Macrodantin, and Nitrofurantoin   Review of Systems Review of Systems  HENT:  Positive for congestion.   Respiratory:  Positive for cough and wheezing.     Physical Exam Triage Vital Signs ED Triage Vitals  Enc Vitals Group     BP 07/20/21 1412 (!) 144/73     Pulse Rate 07/20/21 1412 86     Resp 07/20/21 1412 17     Temp 07/20/21 1412 98.3 F (36.8 C)     Temp Source 07/20/21 1412 Oral     SpO2 07/20/21 1412 97 %     Weight --      Height 07/20/21 1414 $RemoveBefor'5\' 4"'mklShouRreUk$  (1.626 m)     Head Circumference --      Peak Flow --      Pain Score 07/20/21 1414 3     Pain Loc --      Pain Edu? --      Excl. in [place]? --    No data found.  Updated Vital Signs BP (!) 144/73 (BP Location: Right Arm)    Pulse 86    Temp 98.3 F (36.8 C) (Oral)    Resp 17    Ht $R'5\' 4"'Ju$  (1.626 m)    LMP 06/24/2000    SpO2 97%    BMI 27.98 kg/m      Physical Exam Vitals and nursing note reviewed.  Constitutional:      Appearance: Normal  appearance. She is normal weight.  HENT:     Head: Normocephalic and atraumatic.     Right Ear: Tympanic membrane, ear canal and external ear normal.  Left Ear: Tympanic membrane, ear canal and external ear normal.     Mouth/Throat:     Mouth: Mucous membranes are moist.     Pharynx: Oropharynx is clear.     Comments: Significant amount of clear drainage of posterior oropharynx noted Eyes:     Extraocular Movements: Extraocular movements intact.     Conjunctiva/sclera: Conjunctivae normal.     Pupils: Pupils are equal, round, and reactive to light.  Cardiovascular:     Rate and Rhythm: Normal rate and regular rhythm.     Pulses: Normal pulses.     Heart sounds: Normal heart sounds.  Pulmonary:     Effort: Pulmonary effort is normal.     Breath sounds: Wheezing and rhonchi present.     Comments: Diffuse scattered rhonchi noted throughout with wheezes Musculoskeletal:     Cervical back: Normal range of motion and neck supple.  Skin:    General: Skin is warm and dry.  Neurological:     General: No focal deficit present.     Mental Status: She is alert and oriented to person, place, and time.     UC Treatments / Results  Labs (all labs ordered are listed, but only abnormal results are displayed) Labs Reviewed  POC SARS CORONAVIRUS 2 AG -  ED    EKG   Radiology DG Chest 2 View  Result Date: 07/20/2021 CLINICAL DATA:  Cough, shortness of breath EXAM: CHEST - 2 VIEW COMPARISON:  12/06/2019 FINDINGS: Normal heart size, mediastinal contours, and pulmonary vascularity. Mild chronic bronchitic changes. No pulmonary infiltrate, pleural effusion, or pneumothorax. Bones demineralized. Surgical clips RIGHT axilla with postsurgical changes of RIGHT mastectomy IMPRESSION: Mild chronic bronchitic changes without infiltrate. Electronically Signed   By: Lavonia Dana M.D.   On: 07/20/2021 15:04    Procedures Procedures (including critical care time)  Medications Ordered in  UC Medications - No data to display  Initial Impression / Assessment and Plan / UC Course  I have reviewed the triage vital signs and the nursing notes.  Pertinent labs & imaging results that were available during my care of the patient were reviewed by me and considered in my medical decision making (see chart for details).     MDM: 1.  Cough-CXR revealed, Rx'd Doxycycline and Tessalon Perles; 2.  Wheezes-Rx'd Prednisone; 3. Allergic Rhinitis-Rx'd Allegra. Advised patient to take medications as directed with food to completion.  Advised patient to take Allegra and Prednisone with first dose of Doxycycline for the next 5 of 10 days.  Encouraged patient to increase daily water intake while taking these medications. Final Clinical Impressions(s) / UC Diagnoses   Final diagnoses:  Cough, unspecified type  Wheezes  Allergic rhinitis, unspecified seasonality, unspecified trigger     Discharge Instructions      Advised patient to take medications as directed with food to completion.  Advised patient to take Allegra and Prednisone with first dose of Doxycycline for the next 5 of 10 days.  Advised patient may take Tessalon Perles daily or as needed for cough.  Encouraged patient to increase daily water intake while taking these medications.     ED Prescriptions     Medication Sig Dispense Auth. Provider   doxycycline (VIBRAMYCIN) 100 MG capsule Take 1 capsule (100 mg total) by mouth 2 (two) times daily for 7 days. 14 capsule Eliezer Lofts, FNP   predniSONE (DELTASONE) 20 MG tablet Take 3 tabs PO daily x 5 days. 15 tablet Eliezer Lofts, FNP   benzonatate [facility])  200 MG capsule Take 1 capsule (200 mg total) by mouth 3 (three) times daily as needed for up to 7 days for cough. 40 capsule Eliezer Lofts, FNP   fexofenadine [facility] ALLERGY) 180 MG tablet Take 1 tablet (180 mg total) by mouth daily for 15 days. 15 tablet Eliezer Lofts, FNP      PDMP not reviewed this encounter.    Eliezer Lofts, [place] 07/20/21 1515

## 2021-07-20 NOTE — Discharge Instructions (Addendum)
Advised patient to take medications as directed with food to completion.  Advised patient to take Allegra and Prednisone with first dose of Doxycycline for the next 5 of 10 days.  Advised patient may take Tessalon Perles daily or as needed for cough.  Encouraged patient to increase daily water intake while taking these medications.

## 2021-07-20 NOTE — ED Triage Notes (Signed)
Congestion w/ wheezing per pt x  2 days  Negative home COVID test 2 days ago  Husband is also sick - on antibiotics  OTC meds - benadryl, Afrin, tylenol  No meds today  Albuterol inhaler at home pta per PCP - told to come into Urgent Care

## 2021-09-05 ENCOUNTER — Other Ambulatory Visit: Payer: Self-pay | Admitting: Physician Assistant

## 2021-09-05 DIAGNOSIS — F32A Depression, unspecified: Secondary | ICD-10-CM

## 2021-09-06 ENCOUNTER — Telehealth: Payer: Self-pay | Admitting: Physician Assistant

## 2021-09-06 MED ORDER — LEVOTHYROXINE SODIUM 75 MCG PO TABS: ORAL_TABLET | ORAL | 0 refills | Status: DC

## 2021-09-06 NOTE — Telephone Encounter (Signed)
RX sent to [facility] per patient request.  ?

## 2021-09-06 NOTE — Telephone Encounter (Signed)
Pt called and needs a refill on her Synthroid and wants to change her pharmacy to [facility] in [place] ?

## 2021-10-15 DIAGNOSIS — Z853 Personal history of malignant neoplasm of breast: Secondary | ICD-10-CM | POA: Insufficient documentation

## 2021-10-15 DIAGNOSIS — K5730 Diverticulosis of large intestine without perforation or abscess without bleeding: Secondary | ICD-10-CM | POA: Insufficient documentation

## 2021-10-16 ENCOUNTER — Encounter: Payer: Self-pay | Admitting: Physician Assistant

## 2021-10-16 ENCOUNTER — Ambulatory Visit (INDEPENDENT_AMBULATORY_CARE_PROVIDER_SITE_OTHER): Payer: Medicare Other | Admitting: Physician Assistant

## 2021-10-16 VITALS — BP 137/53 | HR 67 | Ht 64.0 in | Wt 161.0 lb

## 2021-10-16 DIAGNOSIS — J3089 Other allergic rhinitis: Secondary | ICD-10-CM

## 2021-10-16 DIAGNOSIS — R7301 Impaired fasting glucose: Secondary | ICD-10-CM | POA: Diagnosis not present

## 2021-10-16 DIAGNOSIS — E039 Hypothyroidism, unspecified: Secondary | ICD-10-CM

## 2021-10-16 DIAGNOSIS — I10 Essential (primary) hypertension: Secondary | ICD-10-CM

## 2021-10-16 DIAGNOSIS — R61 Generalized hyperhidrosis: Secondary | ICD-10-CM | POA: Diagnosis not present

## 2021-10-16 DIAGNOSIS — F39 Unspecified mood [affective] disorder: Secondary | ICD-10-CM

## 2021-10-16 DIAGNOSIS — F32A Depression, unspecified: Secondary | ICD-10-CM | POA: Diagnosis not present

## 2021-10-16 DIAGNOSIS — F419 Anxiety disorder, unspecified: Secondary | ICD-10-CM | POA: Diagnosis not present

## 2021-10-16 DIAGNOSIS — L680 Hirsutism: Secondary | ICD-10-CM

## 2021-10-16 DIAGNOSIS — K580 Irritable bowel syndrome with diarrhea: Secondary | ICD-10-CM

## 2021-10-16 MED ORDER — LISINOPRIL 40 MG PO TABS: ORAL_TABLET | ORAL | 1 refills | Status: DC

## 2021-10-16 MED ORDER — ESCITALOPRAM OXALATE 20 MG PO TABS: ORAL_TABLET | ORAL | 1 refills | Status: DC

## 2021-10-16 MED ORDER — LAMOTRIGINE 100 MG PO TABS: 100.0000 mg | ORAL_TABLET | Freq: Two times a day (BID) | ORAL | 1 refills | Status: DC

## 2021-10-16 MED ORDER — CLONAZEPAM 0.5 MG PO TABS: 0.5000 mg | ORAL_TABLET | Freq: Two times a day (BID) | ORAL | 1 refills | Status: DC | PRN

## 2021-10-16 MED ORDER — HYDROCHLOROTHIAZIDE 12.5 MG PO TABS: 12.5000 mg | ORAL_TABLET | Freq: Every day | ORAL | 1 refills | Status: DC

## 2021-10-16 MED ORDER — FEXOFENADINE HCL 60 MG PO TABS: 60.0000 mg | ORAL_TABLET | Freq: Two times a day (BID) | ORAL | 3 refills | Status: DC

## 2021-10-16 NOTE — Progress Notes (Signed)
? ?Established Patient Office Visit ? ?Subjective   ?Patient ID: Angelica [name], female    DOB: 08-31-1950  Age: 71 y.o. MRN: [id number] ? ?Chief Complaint  ?Patient presents with  ? Hypertension  ? Follow-up  ? ? ?HPI ?Pt is a 71 yo female with HTN, Mood disorder, IBS, hypothyroidism and allergies and here for follow up.  ? ?Pt is doing well. No concerns. Denies any SI/HC. She has some depressed mood from time to time and guilt when she thinks about her father passing away. She denies any CP, palpitations, headaches or vision changes.  ? ?She has had some new night sweats. No medication changes. No fever, chills, body aches, or weight loss.  ? ?Patient Active Problem List  ? Diagnosis Date Noted  ? Hirsutism 10/19/2021  ? Night sweats 10/19/2021  ? Personal history of breast cancer 10/15/2021  ? Diverticular disease of colon 10/15/2021  ? Osteopenia 05/10/2021  ? Injection site reaction 04/04/2020  ? Rotator cuff syndrome, left 05/26/2019  ? Neck fullness 08/31/2018  ? Colon polyps 03/31/2018  ? Left-sided nosebleed 03/02/2018  ? TMJ pain dysfunction syndrome 03/02/2018  ? Calculus of gallbladder 10/21/2017  ? Elevated lipase 10/21/2017  ? Diarrhea 10/21/2017  ? Upper abdominal pain 10/21/2017  ? Cellulitis and abscess of buttock 06/04/2017  ? Essential hypertension 06/11/2016  ? Epistaxis 11/14/2015  ? Trapezius muscle spasm 09/28/2015  ? Elevated blood pressure 04/26/2015  ? Dyslipidemia (high LDL; low HDL) 03/09/2015  ? IBS (irritable bowel syndrome) 08/22/2014  ? Insomnia 03/15/2014  ? Chondromalacia of right patellofemoral joint 02/10/2014  ? Anxiety and depression 08/03/2013  ? Episodic mood disorder ([place]) 10/17/2012  ? Breast cancer ([place]) 01/31/2011  ? GAD (generalized anxiety disorder) 01/31/2011  ? Asthma 01/31/2011  ? Seasonal allergies 01/31/2011  ? Hypothyroid 01/31/2011  ? ?Past Medical History:  ?Diagnosis Date  ? Allergy   ? Anxiety   ? Asthma   ? Breast cancer ([place]) 2009  ? Depression   ? Dyspnea   ?  with heavy pollen   ? Hypertension   ? Hypothyroidism   ? Postmenopausal   ? Thyroid disease   ? ?Allergies  ?Allergen Reactions  ? Penicillins Hives, Swelling, Rash and Other (See Comments)  ?  PATIENT HAS HAD A PCN REACTION WITH IMMEDIATE RASH, FACIAL/TONGUE/THROAT SWELLING, SOB, OR LIGHTHEADEDNESS WITH HYPOTENSION:  #  #  YES  #  # ?Has patient had a PCN reaction causing severe rash involving mucus membranes or skin necrosis: NO ?PATIENT HAS HAD A PCN REACTION THAT REQUIRED HOSPITALIZATION:  #  #  YES  #  #  ?Has patient had a PCN reaction occurring within the last 10 years: NO ? ?  ? Red Dye Hives  ? Sulfa Antibiotics Nausea And Vomiting and Hives  ? Amitriptyline Other (See Comments)  ?  Crazy dreams  ?  ? Macrodantin Nausea And Vomiting  ? Nitrofurantoin Nausea And Vomiting  ? ?  ? ?ROS ?See HPI.  ?  ?Objective:  ?  ? ?BP (!) 137/53   Pulse 67   Ht '5\' 4"'$  (1.626 m)   Wt 161 lb (73 kg)   LMP 06/24/2000   SpO2 99%   BMI 27.64 kg/m?  ?BP Readings from Last 3 Encounters:  ?10/16/21 (!) 137/53  ?07/20/21 (!) 144/73  ?04/17/21 (!) 131/50  ? ?  ? ?Physical Exam ?Vitals reviewed.  ?Constitutional:   ?   Appearance: Normal appearance.  ?HENT:  ?   Head: Normocephalic.  ?  Neck:  ?   Vascular: No carotid bruit.  ?Cardiovascular:  ?   Rate and Rhythm: Normal rate and regular rhythm.  ?   Pulses: Normal pulses.  ?Pulmonary:  ?   Effort: Pulmonary effort is normal.  ?   Breath sounds: Normal breath sounds.  ?Musculoskeletal:  ?   Cervical back: No tenderness.  ?   Right lower leg: No edema.  ?   Left lower leg: No edema.  ?Lymphadenopathy:  ?   Cervical: No cervical adenopathy.  ?Skin: ?   Comments: Facial hair growth chin and neck  ?Neurological:  ?   General: No focal deficit present.  ?   Mental Status: She is alert and oriented to person, place, and time.  ?Psychiatric:     ?   Mood and Affect: Mood normal.  ? ?.. ? ?  10/16/2021  ?  2:44 PM 04/13/2021  ? 11:04 AM 10/16/2020  ? 11:02 AM 04/10/2020  ? 10:37 AM  01/04/2020  ?  9:20 AM  ?Depression screen PHQ 2/9  ?Decreased Interest 1 0 0 0 0  ?Down, Depressed, Hopeless 1 0 0 0 0  ?PHQ - 2 Score 2 0 0 0 0  ?Altered sleeping 2  2 0 2  ?Tired, decreased energy '2  2 2 3  '$ ?Change in appetite 0  2 0 3  ?Feeling bad or failure about yourself  3  2 0 0  ?Trouble concentrating '2  2 3 1  '$ ?Moving slowly or fidgety/restless 2  0 0 0  ?Suicidal thoughts 0  0 0 0  ?PHQ-9 Score '13  10 5 9  '$ ?Difficult doing work/chores Somewhat difficult  Somewhat difficult Somewhat difficult   ?[name]. ? ?  10/16/2021  ?  2:45 PM 10/16/2020  ? 11:02 AM 04/10/2020  ? 10:38 AM 01/04/2020  ?  9:21 AM  ?GAD 7 : Generalized Anxiety Score  ?Nervous, Anxious, on Edge 0 '2 2 1  '$ ?Control/stop worrying '2 2 2 1  '$ ?Worry too much - different things '2 2 2 1  '$ ?Trouble relaxing '2 2 2 2  '$ ?Restless '1 2 2 2  '$ ?Easily annoyed or irritable '2 3 2 3  '$ ?Afraid - awful might happen 1 3 0 1  ?Total GAD 7 Score '10 16 12 11  '$ ?Anxiety Difficulty Somewhat difficult Very difficult Somewhat difficult   ? ? ? ? ? ?  ?Assessment & Plan:  ?..Angelica [name] was seen today for hypertension and follow-up. ? ?Diagnoses and all orders for this visit: ? ?Essential hypertension ?-     COMPLETE METABOLIC PANEL WITH GFR ?-     hydrochlorothiazide (HYDRODIURIL) 12.5 MG tablet; Take 1 tablet (12.5 mg total) by mouth daily. ?-     lisinopril (ZESTRIL) 40 MG tablet; TAKE 1 TABLET(40 MG) BY MOUTH DAILY. ? ?Anxiety and depression ?-     escitalopram (LEXAPRO) 20 MG tablet; Take 1 tablet by mouth once daily ?-     lamoTRIgine (LAMICTAL) 100 MG tablet; Take 1 tablet (100 mg total) by mouth 2 (two) times daily. ?-     clonazePAM (KLONOPIN) 0.5 MG tablet; Take 1 tablet (0.5 mg total) by mouth 2 (two) times daily as needed. for anxiety ? ?Episodic mood disorder (HCC) ?-     lamoTRIgine (LAMICTAL) 100 MG tablet; Take 1 tablet (100 mg total) by mouth 2 (two) times daily. ? ?Irritable bowel syndrome with diarrhea ? ?Night sweats ?-     TSH ?-     CBC with Differential/Platelet ?-  Estradiol ?-     CP Testosterone, BIO-Female/Children ? ?Hirsutism ?-     TSH ?-     CBC with Differential/Platelet ?-     Estradiol ?-     CP Testosterone, BIO-Female/Children ? ?Acquired hypothyroidism ?-     TSH ? ?Environmental and seasonal allergies ?-     fexofenadine (ALLEGRA) 60 MG tablet; Take 1 tablet (60 mg total) by mouth 2 (two) times daily. ? ? ?BP looks good.  ?Will get labs ?PHQ/GAD stable ?Allergies doing well ?Refilled medications ? ?She does mention night sweats and noticed hair growth on face today ?Will get estradiol and testosterone checked ? ? ?Return in about 6 months (around 04/17/2022).  ? ? ?Iran Planas, PA-C ? ?

## 2021-10-17 NOTE — Progress Notes (Signed)
Estrogen is as expected nice and low.  ?Normal hemoglobin ?Thyroid is normal ?Kidney and liver look great ?Electrolytes look great.  ?Your glucose is elevated. Will add an A1C to further evaluate. I know you were not fasting so this could be the reason why.

## 2021-10-19 DIAGNOSIS — L680 Hirsutism: Secondary | ICD-10-CM | POA: Insufficient documentation

## 2021-10-19 DIAGNOSIS — R61 Generalized hyperhidrosis: Secondary | ICD-10-CM | POA: Insufficient documentation

## 2021-10-19 LAB — CBC WITH DIFFERENTIAL/PLATELET
Absolute Monocytes: 362 cells/uL (ref 200–950)
Basophils Absolute: 69 cells/uL (ref 0–200)
Basophils Relative: 0.9 %
Eosinophils Absolute: 208 cells/uL (ref 15–500)
Eosinophils Relative: 2.7 %
HCT: 39.8 % (ref 35.0–45.0)
Hemoglobin: 13.1 g/dL (ref 11.7–15.5)
Lymphs Abs: 3049 cells/uL (ref 850–3900)
MCH: 29.2 pg (ref 27.0–33.0)
MCHC: 32.9 g/dL (ref 32.0–36.0)
MCV: 88.6 fL (ref 80.0–100.0)
MPV: 12.1 fL (ref 7.5–12.5)
Monocytes Relative: 4.7 %
Neutro Abs: 4012 cells/uL (ref 1500–7800)
Neutrophils Relative %: 52.1 %
Platelets: 207 10*3/uL (ref 140–400)
RBC: 4.49 10*6/uL (ref 3.80–5.10)
RDW: 13 % (ref 11.0–15.0)
Total Lymphocyte: 39.6 %
WBC: 7.7 10*3/uL (ref 3.8–10.8)

## 2021-10-19 LAB — COMPLETE METABOLIC PANEL WITH GFR
AG Ratio: 1.8 (calc) (ref 1.0–2.5)
ALT: 10 U/L (ref 6–29)
AST: 15 U/L (ref 10–35)
Albumin: 4.4 g/dL (ref 3.6–5.1)
Alkaline phosphatase (APISO): 88 U/L (ref 37–153)
BUN: 11 mg/dL (ref 7–25)
CO2: 29 mmol/L (ref 20–32)
Calcium: 9.6 mg/dL (ref 8.6–10.4)
Chloride: 103 mmol/L (ref 98–110)
Creat: 0.92 mg/dL (ref 0.60–1.00)
Globulin: 2.4 g/dL (calc) (ref 1.9–3.7)
Glucose, Bld: 182 mg/dL — ABNORMAL HIGH (ref 65–99)
Potassium: 3.7 mmol/L (ref 3.5–5.3)
Sodium: 141 mmol/L (ref 135–146)
Total Bilirubin: 0.8 mg/dL (ref 0.2–1.2)
Total Protein: 6.8 g/dL (ref 6.1–8.1)
eGFR: 67 mL/min/{1.73_m2} (ref >=60)

## 2021-10-19 LAB — ESTRADIOL: Estradiol: <15 pg/mL

## 2021-10-19 LAB — CP TESTOSTERONE, BIO-FEMALE/CHILDREN
Albumin: 4.5 g/dL (ref 3.6–5.1)
Sex Hormone Binding: 71 nmol/L (ref 14–73)
TESTOSTERONE, BIOAVAILABLE: 1.3 ng/dL (ref 0.5–8.8)
Testosterone, Free: 0.6 pg/mL (ref 0.3–5.0)
Testosterone, Total, LC-MS-MS: 10 ng/dL (ref 2–45)

## 2021-10-19 LAB — TSH: TSH: 2.28 mIU/L (ref 0.40–4.50)

## 2021-10-22 NOTE — Progress Notes (Signed)
Check on A1C, not yet resulted.  ?Testosterone normal.

## 2021-11-09 DIAGNOSIS — J019 Acute sinusitis, unspecified: Secondary | ICD-10-CM | POA: Diagnosis not present

## 2021-11-12 ENCOUNTER — Telehealth: Payer: Self-pay | Admitting: Neurology

## 2021-11-12 MED ORDER — METHYLPREDNISOLONE 4 MG PO TBPK: ORAL_TABLET | ORAL | 0 refills | Status: DC

## 2021-11-12 NOTE — Telephone Encounter (Signed)
LVM for patient to call back to see exactly what she needed, refills on Tessalon? Awaiting call back.

## 2021-11-12 NOTE — Telephone Encounter (Signed)
Patient called back, left another message, she states they only gave her 5 days of antibiotics/prednisone/tessalon. She wants more of each medication or a different antibiotic. Still having drainage, ears are still clogged. No fever. Covid test negative. Please advise.

## 2021-11-12 NOTE — Telephone Encounter (Signed)
Patient left vm stating she saw urgent care on 11/09/2021 and was given:    benzonatate (TESSALON PERLES) 100 MG capsule  Sig: Take 1 capsule (100 mg total) by mouth 3 (three) times daily as needed for Cough.  Dispense: 20 capsule  Refill: 0   albuterol 90 mcg/actuation inhaler  Sig: Inhale 2 puffs into the lungs every 4 (four) hours as needed for up to 30 days for Wheezing.  Dispense: 1 each  Refill: 0   azithromycin (ZITHROMAX) 250 MG tablet  Sig: Take two tablets on the first day and then one tablet every day after.  Dispense: 6 tablet  Refill: 0   predniSONE (DELTASONE) 20 MG tablet  Sig: Take 1 tablet (20 mg total) by mouth daily for 5 days.  Dispense: 5 tablet  Refill: 0   She has one pill left and not feeling any better. She is out of town in [place] for a funeral and wants to know if she can get refills.

## 2021-11-13 MED ORDER — FLUTICASONE PROPIONATE 50 MCG/ACT NA SUSP: 2.0000 | Freq: Every day | NASAL | 0 refills | Status: DC

## 2021-11-13 MED ORDER — BENZONATATE 200 MG PO CAPS: 200.0000 mg | ORAL_CAPSULE | Freq: Two times a day (BID) | ORAL | 0 refills | Status: DC | PRN

## 2021-11-13 NOTE — Addendum Note (Signed)
Addended by: Annamaria Helling on: 11/13/2021 08:55 AM   Modules accepted: Orders

## 2021-11-13 NOTE — Telephone Encounter (Signed)
Patient made aware. RX sent for flonase and tessalon.

## 2021-11-20 ENCOUNTER — Ambulatory Visit (INDEPENDENT_AMBULATORY_CARE_PROVIDER_SITE_OTHER): Payer: Medicare PPO | Admitting: Family Medicine

## 2021-11-20 VITALS — BP 135/42 | HR 92 | Resp 16 | Ht 64.0 in | Wt 165.0 lb

## 2021-11-20 DIAGNOSIS — H1032 Unspecified acute conjunctivitis, left eye: Secondary | ICD-10-CM | POA: Diagnosis not present

## 2021-11-20 DIAGNOSIS — H65192 Other acute nonsuppurative otitis media, left ear: Secondary | ICD-10-CM | POA: Diagnosis not present

## 2021-11-20 MED ORDER — CLARITHROMYCIN 250 MG PO TABS: 250.0000 mg | ORAL_TABLET | Freq: Two times a day (BID) | ORAL | 0 refills | Status: AC

## 2021-11-20 MED ORDER — PREDNISONE 20 MG PO TABS: 40.0000 mg | ORAL_TABLET | Freq: Every day | ORAL | 0 refills | Status: DC

## 2021-11-20 NOTE — Progress Notes (Signed)
Acute Office Visit  Subjective:     Patient ID: Angelica [name], female    DOB: 1951-01-04, 71 y.o.   MRN: [id number]  Chief Complaint  Patient presents with   Cough    Clear productive cough, Patient complains of swollen glands in neck, throat feels like its closing and clogged ears for 3 weeks.   Conjunctivitis    Left eye     HPI Patient is in today for Clear productive cough, Patient complains of swollen glands in neck, throat feels like its closing and clogged ears for 3 weeks.  She was seen initially in urgent care on May 19 and treated with azithromycin, prednisone.   She felt like she was maybe better for one day.  Then Angelica [name] had called an extra prednisone for her.  She says that helped maybe a little.  She now is waking up with her left eye crusted and it feels irritated.  ROS      Objective:    BP (!) 135/42   Pulse 92   Resp 16   Ht '5\' 4"'$  (1.626 m)   Wt 165 lb (74.8 kg)   LMP 06/24/2000   SpO2 95%   BMI 28.32 kg/m    Physical Exam Constitutional:      Appearance: She is well-developed.  HENT:     Head: Normocephalic and atraumatic.     Right Ear: Tympanic membrane, ear canal and external ear normal.     Left Ear: Ear canal and external ear normal.     Ears:     Comments: Let TM with effusion    Nose: Nose normal.  Eyes:     Conjunctiva/sclera: Conjunctivae normal.     Pupils: Pupils are equal, round, and reactive to light.  Neck:     Thyroid: No thyromegaly.  Cardiovascular:     Rate and Rhythm: Normal rate and regular rhythm.     Heart sounds: Normal heart sounds.  Pulmonary:     Effort: Pulmonary effort is normal.     Breath sounds: Normal breath sounds. No wheezing.  Musculoskeletal:     Cervical back: Neck supple.  Lymphadenopathy:     Cervical: No cervical adenopathy.  Skin:    General: Skin is warm and dry.  Neurological:     Mental Status: She is alert and oriented to person, place, and time.    No results found for any visits on  11/20/21.      Assessment & Plan:   Problem List Items Addressed This Visit   None Visit Diagnoses     Acute effusion of left ear    -  Primary   Relevant Medications   clarithromycin (BIAXIN) 250 MG tablet   Acute conjunctivitis of left eye, unspecified acute conjunctivitis type          Acute effusion of the left ear-with persistent sinus symptoms and cough and drainage.  We will go ahead and treat with clarithromycin and prednisone.  She has a penicillin allergy, sulfa allergy, and red dye allergy so it limits our choices.  If not better by the end of the week then plan to refer to ENT for further work-up.  If she is not improving after this round of antibiotics then plan to refer to ENT for further work-up.  Left eye conjunctivitis-symptom get a go ahead and put her on oral antibiotics we will not use drops currently.  If it gets worse she can let us know.  Meds ordered this encounter  Medications   predniSONE (DELTASONE) 20 MG tablet    Sig: Take 2 tablets (40 mg total) by mouth daily with breakfast.    Dispense:  10 tablet    Refill:  0   clarithromycin (BIAXIN) 250 MG tablet    Sig: Take 1 tablet (250 mg total) by mouth 2 (two) times daily for 7 days.    Dispense:  14 tablet    Refill:  0    Hold lipitor while on Biaxin    No follow-ups on file.  Angelica Lecher, MD

## 2021-11-30 ENCOUNTER — Encounter: Payer: Self-pay | Admitting: Physician Assistant

## 2021-11-30 DIAGNOSIS — H9202 Otalgia, left ear: Secondary | ICD-10-CM

## 2021-11-30 DIAGNOSIS — H65192 Other acute nonsuppurative otitis media, left ear: Secondary | ICD-10-CM

## 2021-12-04 ENCOUNTER — Other Ambulatory Visit: Payer: Self-pay | Admitting: Physician Assistant

## 2021-12-08 ENCOUNTER — Encounter (HOSPITAL_BASED_OUTPATIENT_CLINIC_OR_DEPARTMENT_OTHER): Payer: Self-pay | Admitting: Emergency Medicine

## 2021-12-08 ENCOUNTER — Emergency Department (HOSPITAL_BASED_OUTPATIENT_CLINIC_OR_DEPARTMENT_OTHER): Payer: Medicare PPO

## 2021-12-08 ENCOUNTER — Other Ambulatory Visit: Payer: Self-pay

## 2021-12-08 ENCOUNTER — Emergency Department (HOSPITAL_BASED_OUTPATIENT_CLINIC_OR_DEPARTMENT_OTHER)
Admission: EM | Admit: 2021-12-08 | Discharge: 2021-12-08 | Disposition: A | Discharge status: Home / Self Care | Payer: Medicare PPO | Attending: Emergency Medicine | Admitting: Emergency Medicine

## 2021-12-08 DIAGNOSIS — S060X0A Concussion without loss of consciousness, initial encounter: Secondary | ICD-10-CM | POA: Insufficient documentation

## 2021-12-08 DIAGNOSIS — Z853 Personal history of malignant neoplasm of breast: Secondary | ICD-10-CM | POA: Insufficient documentation

## 2021-12-08 DIAGNOSIS — I771 Stricture of artery: Secondary | ICD-10-CM | POA: Diagnosis not present

## 2021-12-08 DIAGNOSIS — S0990XA Unspecified injury of head, initial encounter: Secondary | ICD-10-CM | POA: Diagnosis not present

## 2021-12-08 DIAGNOSIS — J45909 Unspecified asthma, uncomplicated: Secondary | ICD-10-CM | POA: Diagnosis not present

## 2021-12-08 DIAGNOSIS — W01198A Fall on same level from slipping, tripping and stumbling with subsequent striking against other object, initial encounter: Secondary | ICD-10-CM | POA: Diagnosis not present

## 2021-12-08 DIAGNOSIS — E039 Hypothyroidism, unspecified: Secondary | ICD-10-CM | POA: Diagnosis not present

## 2021-12-08 DIAGNOSIS — Z79899 Other long term (current) drug therapy: Secondary | ICD-10-CM | POA: Diagnosis not present

## 2021-12-08 DIAGNOSIS — S199XXA Unspecified injury of neck, initial encounter: Secondary | ICD-10-CM | POA: Diagnosis not present

## 2021-12-08 DIAGNOSIS — W19XXXA Unspecified fall, initial encounter: Secondary | ICD-10-CM

## 2021-12-08 DIAGNOSIS — I6523 Occlusion and stenosis of bilateral carotid arteries: Secondary | ICD-10-CM | POA: Diagnosis not present

## 2021-12-08 DIAGNOSIS — I10 Essential (primary) hypertension: Secondary | ICD-10-CM | POA: Insufficient documentation

## 2021-12-08 LAB — COMPREHENSIVE METABOLIC PANEL
ALT: 21 U/L (ref 0–44)
AST: 25 U/L (ref 15–41)
Albumin: 3.8 g/dL (ref 3.5–5.0)
Alkaline Phosphatase: 103 U/L (ref 38–126)
Anion gap: 6 (ref 5–15)
BUN: 11 mg/dL (ref 8–23)
CO2: 29 mmol/L (ref 22–32)
Calcium: 9 mg/dL (ref 8.9–10.3)
Chloride: 105 mmol/L (ref 98–111)
Creatinine, Ser: 0.9 mg/dL (ref 0.44–1.00)
GFR, Estimated: >60 mL/min (ref >60)
Glucose, Bld: 96 mg/dL (ref 70–99)
Potassium: 3.7 mmol/L (ref 3.5–5.1)
Sodium: 140 mmol/L (ref 135–145)
Total Bilirubin: 0.7 mg/dL (ref 0.3–1.2)
Total Protein: 6.6 g/dL (ref 6.5–8.1)

## 2021-12-08 LAB — CBC WITH DIFFERENTIAL/PLATELET
Abs Immature Granulocytes: 0.03 10*3/uL (ref 0.00–0.07)
Basophils Absolute: 0.1 10*3/uL (ref 0.0–0.1)
Basophils Relative: 1 %
Eosinophils Absolute: 0.3 10*3/uL (ref 0.0–0.5)
Eosinophils Relative: 4 %
HCT: 36.3 % (ref 36.0–46.0)
Hemoglobin: 12.3 g/dL (ref 12.0–15.0)
Immature Granulocytes: 0 %
Lymphocytes Relative: 36 %
Lymphs Abs: 2.5 10*3/uL (ref 0.7–4.0)
MCH: 29.9 pg (ref 26.0–34.0)
MCHC: 33.9 g/dL (ref 30.0–36.0)
MCV: 88.3 fL (ref 80.0–100.0)
Monocytes Absolute: 0.6 10*3/uL (ref 0.1–1.0)
Monocytes Relative: 8 %
Neutro Abs: 3.6 10*3/uL (ref 1.7–7.7)
Neutrophils Relative %: 51 %
Platelets: 206 10*3/uL (ref 150–400)
RBC: 4.11 MIL/uL (ref 3.87–5.11)
RDW: 13.8 % (ref 11.5–15.5)
WBC: 7.1 10*3/uL (ref 4.0–10.5)
nRBC: 0 % (ref 0.0–0.2)

## 2021-12-08 MED ORDER — LACTATED RINGERS IV BOLUS
500.0000 mL | Freq: Once | INTRAVENOUS | Status: AC
  Administered 2021-12-08: 500 mL via INTRAVENOUS

## 2021-12-08 MED ORDER — ONDANSETRON HCL 4 MG PO TABS: 4.0000 mg | ORAL_TABLET | Freq: Four times a day (QID) | ORAL | 0 refills | Status: DC

## 2021-12-08 MED ORDER — DIAZEPAM 5 MG/ML IJ SOLN
2.5000 mg | Freq: Once | INTRAMUSCULAR | Status: AC
  Administered 2021-12-08: 2.5 mg via INTRAVENOUS
  Filled 2021-12-08: qty 2

## 2021-12-08 MED ORDER — ONDANSETRON HCL 4 MG/2ML IJ SOLN
4.0000 mg | Freq: Once | INTRAMUSCULAR | Status: AC
Start: 2021-12-08 — End: 2021-12-08
  Administered 2021-12-08: 4 mg via INTRAVENOUS
  Filled 2021-12-08: qty 2

## 2021-12-08 MED ORDER — IOHEXOL 350 MG/ML SOLN
100.0000 mL | Freq: Once | INTRAVENOUS | Status: AC | PRN
  Administered 2021-12-08: 75 mL via INTRAVENOUS

## 2021-12-08 NOTE — ED Provider Notes (Signed)
[place] EMERGENCY DEPARTMENT Provider Note   CSN: [id number] Arrival date & time: 12/08/21  1225     History  Chief Complaint  Patient presents with   Lytle Michaels    Angelica [name] is a 71 y.o. female.  HPI     71yo female with history of hypertension, breast cancer, hypothyroidism, presents with concern for fall with head injury and dizziness.  Reports her leg "went out" on her, no weakness of the leg or acute neurologic symptoms or problem walking after this.  Fell down and hit the back of her head . No LOC, Has headache to back of head and some soreness as well to back of neck.   Denies numbness, weakness, difficulty talking or walking, visual changes or facial droop.  Does feel some feeling off being off balance/dizzy especially with position changes. Denies chest pain, dyspnea, other significant symptoms, no abdominal pain, diarrhea, black or bloody stools.   Past Medical History:  Diagnosis Date   Allergy    Anxiety    Asthma    Breast cancer ([place]) 2009   Depression    Dyspnea    with heavy pollen    Hypertension    Hypothyroidism    Postmenopausal    Thyroid disease      Home Medications Prior to Admission medications   Medication Sig Start Date End Date Taking? Authorizing Provider  ondansetron (ZOFRAN) 4 MG tablet Take 1 tablet (4 mg total) by mouth every 6 (six) hours. 12/08/21  Yes Curatolo, Adam, DO  albuterol (VENTOLIN HFA) 108 (90 Base) MCG/ACT inhaler Inhale 2 puffs into the lungs every 6 (six) hours as needed for wheezing or shortness of breath. 03/18/21   Sharion Balloon, FNP  atorvastatin (LIPITOR) 40 MG tablet Take 1 tablet (40 mg total) by mouth daily at 6 PM. 12/19/20   Breeback, [name] L, PA-C  benzonatate (TESSALON) 200 MG capsule Take 1 capsule (200 mg total) by mouth 2 (two) times daily as needed for cough. 11/13/21   Breeback, [name] L, PA-C  clonazePAM (KLONOPIN) 0.5 MG tablet Take 1 tablet (0.5 mg total) by mouth 2 (two) times daily as  needed. for anxiety 10/16/21   Iran Planas L, PA-C  escitalopram (LEXAPRO) 20 MG tablet Take 1 tablet by mouth once daily 10/16/21   Breeback, [name] L, PA-C  fexofenadine (ALLEGRA) 60 MG tablet Take 1 tablet (60 mg total) by mouth 2 (two) times daily. 10/16/21   Donella Stade, PA-C  fluticasone (FLONASE) 50 MCG/ACT nasal spray Use 2 spray(s) in each nostril once daily 12/04/21   Breeback, [name] L, PA-C  hydrochlorothiazide (HYDRODIURIL) 12.5 MG tablet Take 1 tablet (12.5 mg total) by mouth daily. 10/16/21   Breeback, Royetta Car, PA-C  lamoTRIgine (LAMICTAL) 100 MG tablet Take 1 tablet (100 mg total) by mouth 2 (two) times daily. 10/16/21   Breeback, Royetta Car, PA-C  levothyroxine (SYNTHROID) 75 MCG tablet TAKE 1 TABLET BY MOUTH DAILY BEFORE BREAFAST 09/06/21   Breeback, [name] L, PA-C  lisinopril (ZESTRIL) 40 MG tablet TAKE 1 TABLET(40 MG) BY MOUTH DAILY. 10/16/21   Breeback, Royetta Car, PA-C  predniSONE (DELTASONE) 20 MG tablet Take 2 tablets (40 mg total) by mouth daily with breakfast. 11/20/21   Hali Marry, MD  temazepam (RESTORIL) 30 MG capsule Take 1 capsule (30 mg total) by mouth at bedtime as needed for sleep. 03/08/19   Breeback, [name] L, PA-C      Allergies    Penicillins, Red dye, Sulfa  antibiotics, Amitriptyline, Macrodantin, and Nitrofurantoin    Review of Systems   Review of Systems  Physical Exam Updated Vital Signs BP (!) 111/54   Pulse 61   Temp 98.4 F (36.9 C) (Oral)   Resp 16   Ht '5\' 3"'$  (1.6 m)   Wt 76.2 kg   LMP 06/24/2000   SpO2 95%   BMI 29.76 kg/m  Physical Exam Vitals and nursing note reviewed.  Constitutional:      General: She is not in acute distress.    Appearance: Normal appearance. She is well-developed. She is not ill-appearing or diaphoretic.  HENT:     Head: Normocephalic and atraumatic.     Comments: nystagmus    Right Ear: Tympanic membrane normal.     Left Ear: Tympanic membrane normal.  Eyes:     General: No visual field deficit.    Extraocular  Movements: Extraocular movements intact.     Conjunctiva/sclera: Conjunctivae normal.     Pupils: Pupils are equal, round, and reactive to light.  Cardiovascular:     Rate and Rhythm: Normal rate and regular rhythm.     Pulses: Normal pulses.     Heart sounds: Normal heart sounds. No murmur heard.    No friction rub. No gallop.  Pulmonary:     Effort: Pulmonary effort is normal. No respiratory distress.     Breath sounds: Normal breath sounds. No wheezing or rales.  Abdominal:     General: There is no distension.     Palpations: Abdomen is soft.     Tenderness: There is no abdominal tenderness. There is no guarding.  Musculoskeletal:        General: No swelling or tenderness.     Cervical back: Normal range of motion.  Skin:    General: Skin is warm and dry.     Findings: No erythema or rash.  Neurological:     General: No focal deficit present.     Mental Status: She is alert and oriented to person, place, and time.     GCS: GCS eye subscore is 4. GCS verbal subscore is 5. GCS motor subscore is 6.     Cranial Nerves: No cranial nerve deficit, dysarthria or facial asymmetry.     Sensory: No sensory deficit.     Motor: No weakness or tremor.     Coordination: Coordination normal. Finger-Nose-Finger Test normal.     Gait: Gait normal.     ED Results / Procedures / Treatments   Labs (all labs ordered are listed, but only abnormal results are displayed) Labs Reviewed  CBC WITH DIFFERENTIAL/PLATELET  COMPREHENSIVE METABOLIC PANEL    EKG EKG Interpretation  Date/Time:  Saturday December 08 2021 13:45:54 EDT Ventricular Rate:  60 PR Interval:  173 QRS Duration: 85 QT Interval:  455 QTC Calculation: 455 R Axis:   11 Text Interpretation: Sinus rhythm Atrial premature complex No significant change since last tracing Confirmed by Gareth Morgan [phone]) on 12/08/2021 2:35:57 PM  Radiology CT ANGIO HEAD NECK W WO CM  Result Date: 12/08/2021 CLINICAL DATA:  Dizziness.   Mechanical fall. EXAM: CT ANGIOGRAPHY HEAD AND NECK TECHNIQUE: Multidetector CT imaging of the head and neck was performed using the standard protocol during bolus administration of intravenous contrast. Multiplanar CT image reconstructions and MIPs were obtained to evaluate the vascular anatomy. Carotid stenosis measurements (when applicable) are obtained utilizing NASCET criteria, using the distal internal carotid diameter as the denominator. RADIATION DOSE REDUCTION: This exam was performed according to the  departmental dose-optimization program which includes automated exposure control, adjustment of the mA and/or kV according to patient size and/or use of iterative reconstruction technique. CONTRAST:  23m OMNIPAQUE IOHEXOL 350 MG/ML SOLN COMPARISON:  CT head without contrast 12/08/2021. FINDINGS: CTA NECK FINDINGS Aortic arch: Minimal atherosclerotic changes are present at the aorta and great vessel origins without aneurysm or stenosis. Right carotid system: The right common carotid artery is within normal limits. Mild atherosclerotic changes are present at the bifurcation without a significant stenosis relative to the more distal vessel. Moderate tortuosity is present in the mid cervical right ICA without significant stenosis. Left carotid system: The left common carotid artery is within normal limits. Atherosclerotic changes are present at the bifurcation without a significant stenosis relative to the more distal vessel. Moderate tortuosity is present the mid cervical left ICA without significant stenosis. Vertebral arteries: Left vertebral artery is the dominant vessel. Both vertebral arteries originate from the subclavian arteries without significant stenosis. No significant stenosis or vascular injury is present in either vertebral artery in the neck. Skeleton: Vertebral body heights and alignment are normal. No significant listhesis is present. No focal osseous abnormalities are present. Patient is  edentulous. Other neck: Upper chest: Soft tissues the neck are otherwise unremarkable. Salivary glands are within normal limits. Thyroid is normal. No significant adenopathy is present. No focal mucosal or submucosal lesions are present. Review of the MIP images confirms the above findings CTA HEAD FINDINGS Anterior circulation: Minimal atherosclerotic changes are present within the cavernous internal carotid arteries bilaterally. No significant stenosis is present through the ICA termini. The right A1 segment is aplastic. Both A2 segments fill from the left. M1 segments are normal bilaterally. The MCA bifurcations are within normal limits. ACA and MCA branch vessels are normal. Posterior circulation: Left vertebral artery is the dominant vessel. Right vertebral artery is centrally bifurcates at the PICA. Left PICA origin is visualized and normal. Vertebrobasilar junction is normal. Basilar artery is small. The right posterior cerebral artery originates from basilar tip. The left posterior cerebral artery sees equal contribution from a left P1 segment and the posterior communicating artery. The PCA branch vessels are within normal limits bilaterally. Venous sinuses: The dural sinuses are patent. The straight sinus deep cerebral veins are intact. Cortical veins are within normal limits. No significant vascular malformation is evident. Anatomic variants: Prominent left posterior communicating artery. Review of the MIP images confirms the above findings IMPRESSION: 1. Minimal atherosclerotic changes at the carotid bifurcations and cavernous internal carotid arteries bilaterally without significant stenosis relative to the more distal vessels. 2. Moderate tortuosity of the cervical internal carotid arteries bilaterally without significant stenosis. 3. No significant vertebral artery stenosis. 4. Normal variant Circle of Willis without significant proximal stenosis, aneurysm, or branch vessel occlusion. Electronically  Signed   By: CSan MorelleM.D.   On: 12/08/2021 15:43   CT C-SPINE NO CHARGE  Result Date: 12/08/2021 CLINICAL DATA:  Pt arrives pov, steady gait, endorses mechanical fall after knee "gave out" pta. Reports hitting posterior head, c/o HA. EXAM: CT CERVICAL SPINE WITHOUT CONTRAST TECHNIQUE: Multidetector CT imaging of the cervical spine was performed without intravenous contrast. Multiplanar CT image reconstructions were also generated. RADIATION DOSE REDUCTION: This exam was performed according to the departmental dose-optimization program which includes automated exposure control, adjustment of the mA and/or kV according to patient size and/or use of iterative reconstruction technique. COMPARISON:  None Available. FINDINGS: Alignment: Normal. Skull base and vertebrae: No acute fracture. No aggressive appearing focal osseous lesion  or focal pathologic process. Soft tissues and spinal canal: No prevertebral fluid or swelling. No visible canal hematoma. Upper chest: Unremarkable. Other: Atherosclerotic plaque of the carotid arteries within the neck. IMPRESSION: 1. No acute displaced fracture or traumatic listhesis of the cervical spine. 2. Please see separately dictated CT angiography head and 12/08/2021. Electronically Signed   By: Iven Finn M.D.   On: 12/08/2021 15:25   CT Head Wo Contrast  Result Date: 12/08/2021 CLINICAL DATA:  Head trauma. Mechanical fall. Trauma to posterior head. EXAM: CT HEAD WITHOUT CONTRAST TECHNIQUE: Contiguous axial images were obtained from the base of the skull through the vertex without intravenous contrast. RADIATION DOSE REDUCTION: This exam was performed according to the departmental dose-optimization program which includes automated exposure control, adjustment of the mA and/or kV according to patient size and/or use of iterative reconstruction technique. COMPARISON:  CT head without contrast 12/06/2019 FINDINGS: Brain: No acute infarct, hemorrhage, or mass  lesion is present. The ventricles are of normal size. No significant white matter lesions are present. No significant extraaxial fluid collection is present. The brainstem and cerebellum are within normal limits. Vascular: No hyperdense vessel or unexpected calcification. Skull: Calvarium is intact. No focal lytic or blastic lesions are present. No significant extracranial soft tissue lesion is present. Sinuses/Orbits: The paranasal sinuses and mastoid air cells are clear. The globes and orbits are within normal limits. IMPRESSION: Negative CT of the head. Electronically Signed   By: San Morelle M.D.   On: 12/08/2021 13:13    Procedures Procedures    Medications Ordered in ED Medications  lactated ringers bolus 500 mL ( Intravenous Stopped 12/08/21 1444)  ondansetron (ZOFRAN) injection 4 mg (4 mg Intravenous Given 12/08/21 1401)  diazepam (VALIUM) injection 2.5 mg (2.5 mg Intravenous Given 12/08/21 1403)  iohexol (OMNIPAQUE) 350 MG/ML injection 100 mL (75 mLs Intravenous Contrast Given 12/08/21 1447)    ED Course/ Medical Decision Making/ A&P     71yo female with history of hypertension, breast cancer, hypothyroidism, presents with concern for fall with head injury and dizziness.  DDx includes ICH, CVA, vertebral dissection, fracture, concussion.  CT head completed and without ICH.  Given nystagmus, dizziness, and neck pain, will evaluate with CTA head/neck to evaluate for traumatic vertebral dissection and obtain CT CSpine.  Placed in collar given tenderness midline.    Labs completed and interpreted by me without acute abnormalities.  EKG completed and without acute findings.  Have low suspicion for CVA given symptoms began in setting of trauma and if CTA without acute abnormalities, feel she likely has concussion as etiology of mild dizziness and can follow up closely as an outpatient.        Final Clinical Impression(s) / ED Diagnoses Final diagnoses:  Fall, initial  encounter  Concussion without loss of consciousness, initial encounter    Rx / DC Orders ED Discharge Orders          Ordered    ondansetron (ZOFRAN) 4 MG tablet  Every 6 hours        12/08/21 1600              Gareth Morgan, MD 12/08/21 1639

## 2021-12-08 NOTE — ED Provider Notes (Signed)
Assumed care of patient at 3 PM.  She is awaiting CT scan of her neck and CTA of her head and neck after mechanical fall today she has had some dizziness.  Head CT already back and is unremarkable.  CT of the neck and CTA head and neck overall unremarkable.  She has been ambulatory without any issues.  Overall suspect mild concussion.  We will refer her to sports medicine.  Recommend Tylenol, ibuprofen for any pain.  Educated about concussions.  Discharged in good conditions.   Lennice Sites, DO 12/08/21 1548

## 2021-12-08 NOTE — ED Triage Notes (Signed)
Pt arrives pov, steady gait, endorses mechanical fall after knee "gave out" pta. Reports hitting posterior head, c/o HA. Pt denies thinners, denies loc, denies blurred vision, endorses mild dizziness. Denies neck pain

## 2021-12-18 ENCOUNTER — Other Ambulatory Visit: Payer: Self-pay | Admitting: Physician Assistant

## 2021-12-18 MED ORDER — LEVOTHYROXINE SODIUM 75 MCG PO TABS: ORAL_TABLET | ORAL | 2 refills | Status: DC

## 2021-12-19 ENCOUNTER — Ambulatory Visit (INDEPENDENT_AMBULATORY_CARE_PROVIDER_SITE_OTHER): Payer: Medicare PPO | Admitting: Sports Medicine

## 2021-12-19 ENCOUNTER — Ambulatory Visit (INDEPENDENT_AMBULATORY_CARE_PROVIDER_SITE_OTHER): Payer: Medicare PPO

## 2021-12-19 DIAGNOSIS — M25562 Pain in left knee: Secondary | ICD-10-CM

## 2021-12-19 DIAGNOSIS — G8929 Other chronic pain: Secondary | ICD-10-CM

## 2021-12-19 DIAGNOSIS — Z09 Encounter for follow-up examination after completed treatment for conditions other than malignant neoplasm: Secondary | ICD-10-CM

## 2021-12-19 DIAGNOSIS — M1712 Unilateral primary osteoarthritis, left knee: Secondary | ICD-10-CM | POA: Insufficient documentation

## 2021-12-19 DIAGNOSIS — M1711 Unilateral primary osteoarthritis, right knee: Secondary | ICD-10-CM | POA: Diagnosis not present

## 2021-12-19 MED ORDER — MELOXICAM 15 MG PO TABS: ORAL_TABLET | ORAL | 3 refills | Status: DC

## 2021-12-19 NOTE — Assessment & Plan Note (Signed)
Very pleasant 71 year old female, chronic left knee pain, medial joint line, she has started to have more episodes of locking and giving out. On exam she has only minimal swelling, tenderness at the medial joint line, pain with terminal flexion, she has significant weakness to extension. Due to the chronicity and the weakness as well as mechanical symptoms we will proceed with x-rays, MRI, meloxicam, she does need aggressive physical therapy. She should walk with a cane. Return to see me in about 6 weeks.

## 2021-12-19 NOTE — Progress Notes (Signed)
    Procedures performed today:    None.  Independent interpretation of notes and tests performed by another provider:   None.  Brief History, Exam, Impression, and Recommendations:    Chronic pain of left knee Very pleasant 71 year old female, chronic left knee pain, medial joint line, she has started to have more episodes of locking and giving out. On exam she has only minimal swelling, tenderness at the medial joint line, pain with terminal flexion, she has significant weakness to extension. Due to the chronicity and the weakness as well as mechanical symptoms we will proceed with x-rays, MRI, meloxicam, she does need aggressive physical therapy. She should walk with a cane. Return to see me in about 6 weeks.  Chronic process with exacerbation and pharmacologic intervention  ____________________________________________ Gwen Her. Dianah Field, M.D., ABFM., CAQSM., AME. Primary Care and Sports Medicine [facility]  Adjunct Professor of [employer]  Risk manager

## 2021-12-22 ENCOUNTER — Ambulatory Visit (INDEPENDENT_AMBULATORY_CARE_PROVIDER_SITE_OTHER): Payer: Medicare PPO

## 2021-12-22 DIAGNOSIS — G8929 Other chronic pain: Secondary | ICD-10-CM | POA: Diagnosis not present

## 2021-12-22 DIAGNOSIS — M25562 Pain in left knee: Secondary | ICD-10-CM | POA: Diagnosis not present

## 2021-12-26 ENCOUNTER — Other Ambulatory Visit: Payer: Self-pay

## 2021-12-26 ENCOUNTER — Encounter: Payer: Self-pay | Admitting: Rehabilitative and Restorative Service Providers"

## 2021-12-26 ENCOUNTER — Ambulatory Visit: Payer: Medicare PPO | Attending: Sports Medicine | Admitting: Rehabilitative and Restorative Service Providers"

## 2021-12-26 DIAGNOSIS — M25562 Pain in left knee: Secondary | ICD-10-CM | POA: Insufficient documentation

## 2021-12-26 DIAGNOSIS — G8929 Other chronic pain: Secondary | ICD-10-CM | POA: Diagnosis not present

## 2021-12-26 DIAGNOSIS — M6281 Muscle weakness (generalized): Secondary | ICD-10-CM

## 2021-12-26 DIAGNOSIS — R2689 Other abnormalities of gait and mobility: Secondary | ICD-10-CM

## 2021-12-26 NOTE — Patient Instructions (Signed)
Access Code: [email or web address] URL: [email or web address] Date: 12/26/2021 Prepared by: Rudell Cobb  Exercises - Supine Quad Set  - 2 x daily - 7 x weekly - 1 sets - 5-10 reps - 5 seconds hold - Active Straight Leg Raise with Quad Set  - 2 x daily - 7 x weekly - 1 sets - 5-10 reps - Sidelying Hip Abduction  - 2 x daily - 7 x weekly - 1 sets - 10 reps - Seated Heel Slide  - 2 x daily - 7 x weekly - 1 sets - 10 reps - Seated Long Arc Quad  - 2 x daily - 7 x weekly - 1 sets - 5-10 reps  Patient Education - Ice Massage

## 2021-12-26 NOTE — Therapy (Signed)
[place], [place], [address] Phone: [phone]   Fax:  [phone]  Physical Therapy Evaluation  Patient Details  Name: Angelica [name] MRN: [id number] Date of Birth: 05/04/1951 Referring Provider (PT): Aundria Mems, MD   Encounter Date: 12/26/2021   PT End of Session - 12/26/21 1346     Visit Number 1    Number of Visits 16    Date for PT Re-Evaluation 02/24/22    Authorization Type humana-- requested 16 visits    PT Start Time 0932    PT Stop Time 2694    PT Time Calculation (min) 43 min    Activity Tolerance Patient tolerated treatment well;Patient limited by pain    Behavior During Therapy [facility] for tasks assessed/performed             Past Medical History:  Diagnosis Date   Allergy    Anxiety    Asthma    Breast cancer ([place]) 2009   Depression    Dyspnea    with heavy pollen    Hypertension    Hypothyroidism    Postmenopausal    Thyroid disease     Past Surgical History:  Procedure Laterality Date   ADENOIDECTOMY     APPENDECTOMY     BREAST RECONSTRUCTION  06/2008   CHOLECYSTECTOMY N/A 11/12/2017   Procedure: LAPAROSCOPIC CHOLECYSTECTOMY WITH INTRAOPERATIVE CHOLANGIOGRAM ERAS PATHWAY;  Surgeon: Erroll Luna, MD;  Location: [facility];  Service: General;  Laterality: N/A;   MASTECTOMY  2009   [facility]Ca   TONSILLECTOMY      There were no vitals filed for this visit.    Subjective Assessment - 12/26/21 0938     Subjective The patient reports that she had chronic knee pain and then fell at the gas station when the L knee gave way.  She sustained a concussion and has ongoing HA.  Her knee seems to hurt and then she gets a HA afterwards. She started using a cane, but still feels unsteady.  "I just feel unsteady".  She fell in the kitchen on Saturday due to the L knee giving way.    Pertinent History asthma, arthritis, HTN, hypercholesteremia    Patient Stated Goals reduce pain,  improve walking "I just want to be walking."    Currently in Pain? Yes    Pain Score 7     Pain Location Knee    Pain Orientation Left;Medial    Pain Descriptors / Indicators Aching;Discomfort;Sore;Throbbing    Pain Onset More than a month ago    Pain Frequency Intermittent    Aggravating Factors  walking    Pain Relieving Factors sitting                [facility] PT Assessment - 12/26/21 0946       Assessment   Medical Diagnosis chronic L knee pain    Referring Provider (PT) Aundria Mems, MD    Onset Date/Surgical Date 12/19/21    Hand Dominance Right    Next MD Visit in 6 weeks    Prior Therapy none      Precautions   Precautions Fall      Restrictions   Weight Bearing Restrictions No      Balance Screen   Has the patient fallen in the past 6 months Yes    How many times? 2- knee gives way    Has the patient had a decrease in activity level because of a fear of falling?  Yes    Is the patient reluctant to leave their home because of a fear of falling?  No      Home Environment   Living Environment Private residence    Living Arrangements Spouse/significant other;Children    [place] to enter    Home Layout Two level    Alternate Level Stairs-Number of Steps 12-14    Searcy - single point    Additional Comments does not feel safe with cane at this time      Prior Function   Level of [place] Retired    Leisure lets dog out      Observation/Other Assessments   Focus on Therapeutic Outcomes (FOTO)  47%      Observation/Other Assessments-Edema    Edema --   notes some swelling     Sensation   Light Touch Appears Intact      ROM / Strength   AROM / PROM / Strength AROM;Strength;PROM      AROM   Overall AROM  Deficits    AROM Assessment Site Knee;Ankle    Right/Left Knee Right;Left    Right Knee Extension 0    Right Knee Flexion 120    Left Knee Extension -28    Left Knee Flexion 100   limited  by pain     PROM   Overall PROM  Deficits    PROM Assessment Site Knee    Right/Left Knee Right;Left    Left Knee Extension full with overpressure    Left Knee Flexion 115      Strength   Overall Strength Deficits    Strength Assessment Site Knee    Right/Left Knee Right;Left    Right Knee Flexion 4/5    Right Knee Extension 5/5    Left Knee Flexion 3/5    Left Knee Extension 3-/5      Palpation   Palpation comment painful medial aspect and with patellar mobility; no pain posterior aspect with palpation      Special Tests   Other special tests *began with some special tests, but all painful      Ambulation/Gait   Ambulation/Gait Yes    Ambulation/Gait Assistance 5: Supervision;4: Min guard    Ambulation Distance (Feet) 75 Feet    Assistive device Straight cane    Gait Pattern Decreased weight shift to left   unsteady   Ambulation Surface Level;Indoor    Gait Comments cues on correct sequencing; discussed importance of HEP to help reduce falls and improve strength                        Objective measurements completed on examination: See above findings.       [facility] Adult PT Treatment/Exercise - 12/26/21 0955       Exercises   Exercises Knee/Hip;Ankle      Knee/Hip Exercises: Stretches   Active Hamstring Stretch Right;Left;2 reps;30 seconds      Knee/Hip Exercises: Seated   Long Arc Quad Strengthening;Left;5 reps    Long Arc Quad Limitations within tolerable ROM    Heel Slides AROM;Left;5 reps    [facility] x 10 reps      Knee/Hip Exercises: Supine   Quad Sets Strengthening;Left;5 reps    Straight Leg Raises Strengthening;AROM;Left;5 reps      Knee/Hip Exercises: Sidelying   Hip ABduction Strengthening;Left;5 reps      Modalities   Modalities Cryotherapy  Cryotherapy   Number Minutes Cryotherapy 5 Minutes    Cryotherapy Location Knee    Type of Cryotherapy Ice massage   educated for home                    PT  Education - 12/26/21 1337     Education Details HEP    Person(s) Educated Patient    Methods Explanation;Demonstration;Handout    Comprehension Verbalized understanding;Returned demonstration              PT Short Term Goals - 12/26/21 1338       PT SHORT TERM GOAL #1   Title The patient will be indep with HEP.    Time 4    Period Weeks    Target Date 01/25/22      PT SHORT TERM GOAL #2   Title The patient will improve L LE LAQ to full extension.    Time 4    Period Weeks    Target Date 01/25/22      PT SHORT TERM GOAL #3   Title The patient will tolerate supine heel slide without c/o L knee pain.    Time 4    Period Weeks    Target Date 01/25/22               PT Long Term Goals - 12/26/21 1340       PT LONG TERM GOAL #1   Title The patient will be indep wiht progression of HEP.    Time 8    Period Weeks    Target Date 02/24/22      PT LONG TERM GOAL #2   Title The patient will imrpove FOTO score from 47% up to 60%.    Time 8    Period Weeks    Target Date 02/24/22      PT LONG TERM GOAL #3   Title The patient will report pain in R knee < or equal to 2/10.    Time 8    Period Weeks    Target Date 02/24/22      PT LONG TERM GOAL #4   Title *measure gait speed-- and improve by 0.5 ft/sec with SPC mod indep    Time 8    Period Weeks    Target Date 02/24/22                    Plan - 12/26/21 1343     Clinical Impression Statement The patient is a 71 year old female presenting to OP physical therapy with h/o chronic knee pain with recent falls.  She c/o medial knee pain with impairments in AROM, strength, gait speed, and ability to perform functional tasks.  The patient is more sedentary due to sensation the knee may buckle.  PT encouraged participation in HEP to reduce fall risk.    Personal Factors and Comorbidities Time since onset of injury/illness/exacerbation;Comorbidity 1;Comorbidity 2    Comorbidities HTN, falls     Examination-Activity Limitations Locomotion Level;Bend;Squat    Examination-Participation Restrictions Community Activity    Stability/Clinical Decision Making Evolving/Moderate complexity    Clinical Decision Making Moderate    Rehab Potential Good    PT Frequency 2x / week    PT Duration 8 weeks    PT Treatment/Interventions ADLs/Self Care Home Management;Manual techniques;Neuromuscular re-education;Patient/family education;Therapeutic exercise;Therapeutic activities;Taping;Aquatic Therapy;Cryotherapy;Moist Heat;Electrical Stimulation;Dry needling;Balance training;Gait training    PT Next Visit Plan progress HEP, LE strengthening within tolerance    PT Home Exercise  Plan [id number]    Consulted and Agree with Plan of Care Patient             Patient will benefit from skilled therapeutic intervention in order to improve the following deficits and impairments:  Pain, Impaired flexibility, Decreased range of motion, Decreased strength, Decreased balance, Decreased activity tolerance, Increased fascial restricitons, Abnormal gait  Visit Diagnosis: Chronic pain of left knee  Muscle weakness (generalized)  Other abnormalities of gait and mobility     Problem List Patient Active Problem List   Diagnosis Date Noted   Chronic pain of left knee 12/19/2021   Hirsutism 10/19/2021   Night sweats 10/19/2021   Personal history of breast cancer 10/15/2021   Diverticular disease of colon 10/15/2021   Osteopenia 05/10/2021   Injection site reaction 04/04/2020   Rotator cuff syndrome, left 05/26/2019   Neck fullness 08/31/2018   Colon polyps 03/31/2018   Left-sided nosebleed 03/02/2018   TMJ pain dysfunction syndrome 03/02/2018   Calculus of gallbladder 10/21/2017   Elevated lipase 10/21/2017   Diarrhea 10/21/2017   Upper abdominal pain 10/21/2017   Cellulitis and abscess of buttock 06/04/2017   Essential hypertension 06/11/2016   Epistaxis 11/14/2015   Trapezius muscle spasm  09/28/2015   Elevated blood pressure 04/26/2015   Dyslipidemia (high LDL; low HDL) 03/09/2015   IBS (irritable bowel syndrome) 08/22/2014   Insomnia 03/15/2014   Chondromalacia of right patellofemoral joint 02/10/2014   Anxiety and depression 08/03/2013   Episodic mood disorder ([place]) 10/17/2012   Breast cancer ([place]) 01/31/2011   GAD (generalized anxiety disorder) 01/31/2011   Asthma 01/31/2011   Seasonal allergies 01/31/2011   Hypothyroid 01/31/2011    Prynce Jacober, PT 12/26/2021, 1:49 PM  [facility] [place] [address] [place], [place], [address] Phone: [phone]   Fax:  [phone]  Name: Angelica [name] MRN: [id number] Date of Birth: 1951-02-27

## 2022-01-01 ENCOUNTER — Ambulatory Visit: Payer: Medicare PPO | Admitting: Physical Therapy

## 2022-01-01 DIAGNOSIS — G8929 Other chronic pain: Secondary | ICD-10-CM | POA: Diagnosis not present

## 2022-01-01 DIAGNOSIS — R2689 Other abnormalities of gait and mobility: Secondary | ICD-10-CM

## 2022-01-01 DIAGNOSIS — M6281 Muscle weakness (generalized): Secondary | ICD-10-CM

## 2022-01-01 DIAGNOSIS — M25562 Pain in left knee: Secondary | ICD-10-CM | POA: Diagnosis not present

## 2022-01-01 NOTE — Therapy (Signed)
[place], [place], [address] Phone: [phone]   Fax:  [phone]  Physical Therapy Treatment  Patient Details  Name: Angelica [name] MRN: [id number] Date of Birth: 02-21-1951 Referring Provider (PT): Aundria Mems, MD   Encounter Date: 01/01/2022 Rationale for Evaluation and Treatment Rehabilitation   PT End of Session - 01/01/22 0929     Visit Number 2    Number of Visits 16    Date for PT Re-Evaluation 02/24/22    Authorization Type humana-- requested 16 visits    Authorization - Visit Number 2    Progress Note Due on Visit 10    PT Start Time 0856    PT Stop Time 0935    PT Time Calculation (min) 39 min    Activity Tolerance Patient tolerated treatment well    Behavior During Therapy [facility] for tasks assessed/performed             Past Medical History:  Diagnosis Date   Allergy    Anxiety    Asthma    Breast cancer ([place]) 2009   Depression    Dyspnea    with heavy pollen    Hypertension    Hypothyroidism    Postmenopausal    Thyroid disease     Past Surgical History:  Procedure Laterality Date   ADENOIDECTOMY     APPENDECTOMY     BREAST RECONSTRUCTION  06/2008   CHOLECYSTECTOMY N/A 11/12/2017   Procedure: LAPAROSCOPIC CHOLECYSTECTOMY WITH INTRAOPERATIVE CHOLANGIOGRAM ERAS PATHWAY;  Surgeon: Erroll Luna, MD;  Location: [facility];  Service: General;  Laterality: N/A;   MASTECTOMY  2009   BrCa   TONSILLECTOMY      There were no vitals filed for this visit.   Subjective Assessment - 01/01/22 0859     Subjective Pt states she has been performing HEP. She continues to have pain but ice helps    Patient Stated Goals reduce pain, improve walking "I just want to be walking."    Currently in Pain? Yes    Pain Score 5     Pain Location Knee    Pain Orientation Left                [facility] PT Assessment - 01/01/22 0001       Assessment   Medical Diagnosis chronic  L knee pain    Referring Provider (PT) Aundria Mems, MD    Onset Date/Surgical Date 12/19/21    Hand Dominance Right    Next MD Visit in 6 weeks    Prior Therapy none      AROM   Left Knee Extension 0    Left Knee Flexion 105   pain                          [facility] Adult PT Treatment/Exercise - 01/01/22 0001       Knee/Hip Exercises: Aerobic   Nustep L5 x 5 min for warm up      Knee/Hip Exercises: Standing   Heel Raises 10 reps;2 sets    Forward Step Up Limitations 2'' step bilat UE support 2 x 10    Other Standing Knee Exercises tandem stance trials on foam and on ground with pt only able to perform 3-4 seconds without UE support      Knee/Hip Exercises: Seated   Long Arc Quad 2 sets;10 reps    [facility] 10 reps  Sit to Sand 2 sets;5 reps;with UE support      Knee/Hip Exercises: Supine   Quad Sets 10 reps    Quad Sets Limitations 5 sec hold    Heel Slides 10 reps    Straight Leg Raises 2 sets;10 reps      Knee/Hip Exercises: Sidelying   Hip ABduction 2 sets;10 reps      Cryotherapy   Number Minutes Cryotherapy 10 Minutes    Cryotherapy Location Knee    Type of Cryotherapy Ice pack                       PT Short Term Goals - 12/26/21 1338       PT SHORT TERM GOAL #1   Title The patient will be indep with HEP.    Time 4    Period Weeks    Target Date 01/25/22      PT SHORT TERM GOAL #2   Title The patient will improve L LE LAQ to full extension.    Time 4    Period Weeks    Target Date 01/25/22      PT SHORT TERM GOAL #3   Title The patient will tolerate supine heel slide without c/o L knee pain.    Time 4    Period Weeks    Target Date 01/25/22               PT Long Term Goals - 12/26/21 1340       PT LONG TERM GOAL #1   Title The patient will be indep wiht progression of HEP.    Time 8    Period Weeks    Target Date 02/24/22      PT LONG TERM GOAL #2   Title The patient will imrpove FOTO score  from 47% up to 60%.    Time 8    Period Weeks    Target Date 02/24/22      PT LONG TERM GOAL #3   Title The patient will report pain in R knee < or equal to 2/10.    Time 8    Period Weeks    Target Date 02/24/22      PT LONG TERM GOAL #4   Title *measure gait speed-- and improve by 0.5 ft/sec with SPC mod indep    Time 8    Period Weeks    Target Date 02/24/22                   Plan - 01/01/22 0929     Clinical Impression Statement Pt with improved ROM today. Continues to be limtied by pain. she had difficulty with balance with sit to stand without UE support and with tandem stance. Encouraged pt to practice tandem stance at home    PT Next Visit Plan progress HEP, LE strengthening within tolerance    PT Home Exercise Plan [id number]    Consulted and Agree with Plan of Care Patient             Patient will benefit from skilled therapeutic intervention in order to improve the following deficits and impairments:     Visit Diagnosis: Chronic pain of left knee  Muscle weakness (generalized)  Other abnormalities of gait and mobility     Problem List Patient Active Problem List   Diagnosis Date Noted   Chronic pain of left knee 12/19/2021   Hirsutism 10/19/2021   Night sweats 10/19/2021   Personal  history of breast cancer 10/15/2021   Diverticular disease of colon 10/15/2021   Osteopenia 05/10/2021   Injection site reaction 04/04/2020   Rotator cuff syndrome, left 05/26/2019   Neck fullness 08/31/2018   Colon polyps 03/31/2018   Left-sided nosebleed 03/02/2018   TMJ pain dysfunction syndrome 03/02/2018   Calculus of gallbladder 10/21/2017   Elevated lipase 10/21/2017   Diarrhea 10/21/2017   Upper abdominal pain 10/21/2017   Cellulitis and abscess of buttock 06/04/2017   Essential hypertension 06/11/2016   Epistaxis 11/14/2015   Trapezius muscle spasm 09/28/2015   Elevated blood pressure 04/26/2015   Dyslipidemia (high LDL; low HDL) 03/09/2015    IBS (irritable bowel syndrome) 08/22/2014   Insomnia 03/15/2014   Chondromalacia of right patellofemoral joint 02/10/2014   Anxiety and depression 08/03/2013   Episodic mood disorder ([place]) 10/17/2012   Breast cancer ([place]) 01/31/2011   GAD (generalized anxiety disorder) 01/31/2011   Asthma 01/31/2011   Seasonal allergies 01/31/2011   Hypothyroid 01/31/2011    Liviah Cake, PT 01/01/2022, 9:31 AM  [facility] [place], [place], [address] Phone: [phone]   Fax:  [phone]  Name: Angelica [name] MRN: [id number] Date of Birth: 07/10/1950

## 2022-01-03 ENCOUNTER — Ambulatory Visit (INDEPENDENT_AMBULATORY_CARE_PROVIDER_SITE_OTHER): Payer: Medicare PPO

## 2022-01-03 ENCOUNTER — Ambulatory Visit (INDEPENDENT_AMBULATORY_CARE_PROVIDER_SITE_OTHER): Payer: Medicare PPO | Admitting: Sports Medicine

## 2022-01-03 DIAGNOSIS — M1712 Unilateral primary osteoarthritis, left knee: Secondary | ICD-10-CM | POA: Diagnosis not present

## 2022-01-03 DIAGNOSIS — M25562 Pain in left knee: Secondary | ICD-10-CM | POA: Diagnosis not present

## 2022-01-03 DIAGNOSIS — G8929 Other chronic pain: Secondary | ICD-10-CM | POA: Diagnosis not present

## 2022-01-03 NOTE — Assessment & Plan Note (Signed)
This is a very pleasant 71 year old female, chronic left knee pain, ultimately MRI showed meniscal fraying, osteoarthritis. Has not responded to conservative treatment with meloxicam and therapy, so today we did an injection, return to see me in 6 weeks, we will get her approved for viscosupplementation if she does not improve significantly

## 2022-01-03 NOTE — Progress Notes (Signed)
    Procedures performed today:    Procedure: Real-time Ultrasound Guided injection of the left knee Device: Samsung HS60  Verbal informed consent obtained.  Time-out conducted.  Noted no overlying erythema, induration, or other signs of local infection.  Skin prepped in a sterile fashion.  Local anesthesia: Topical Ethyl chloride.  With sterile technique and under real time ultrasound guidance: No effusion noted, 1 cc Kenalog 40, 2 cc lidocaine, 2 cc bupivacaine injected easily Completed without difficulty  Advised to call if fevers/chills, erythema, induration, drainage, or persistent bleeding.  Images permanently stored and available for review in PACS.  Impression: Technically successful ultrasound guided injection.  Independent interpretation of notes and tests performed by another provider:   None.  Brief History, Exam, Impression, and Recommendations:    Primary osteoarthritis of left knee This is a very pleasant 71 year old female, chronic left knee pain, ultimately MRI showed meniscal fraying, osteoarthritis. Has not responded to conservative treatment with meloxicam and therapy, so today we did an injection, return to see me in 6 weeks, we will get her approved for viscosupplementation if she does not improve significantly  Chronic process with exacerbation and pharmacologic intervention  ____________________________________________ Gwen Her. Dianah Field, M.D., ABFM., CAQSM., AME. Primary Care and Sports Medicine [facility]  Adjunct Professor of [employer] of Medicine  Risk manager

## 2022-01-04 ENCOUNTER — Other Ambulatory Visit: Payer: Self-pay

## 2022-01-04 ENCOUNTER — Encounter: Payer: Self-pay | Admitting: Physician Assistant

## 2022-01-04 DIAGNOSIS — F39 Unspecified mood [affective] disorder: Secondary | ICD-10-CM

## 2022-01-04 DIAGNOSIS — F419 Anxiety disorder, unspecified: Secondary | ICD-10-CM

## 2022-01-04 DIAGNOSIS — I10 Essential (primary) hypertension: Secondary | ICD-10-CM

## 2022-01-04 MED ORDER — LAMOTRIGINE 100 MG PO TABS: 100.0000 mg | ORAL_TABLET | Freq: Two times a day (BID) | ORAL | 1 refills | Status: DC

## 2022-01-04 MED ORDER — LISINOPRIL 40 MG PO TABS: ORAL_TABLET | ORAL | 1 refills | Status: DC

## 2022-01-04 MED ORDER — ESCITALOPRAM OXALATE 20 MG PO TABS: ORAL_TABLET | ORAL | 1 refills | Status: DC

## 2022-01-04 MED ORDER — HYDROCHLOROTHIAZIDE 12.5 MG PO TABS: 12.5000 mg | ORAL_TABLET | Freq: Every day | ORAL | 1 refills | Status: DC

## 2022-01-07 ENCOUNTER — Ambulatory Visit: Payer: Medicare PPO | Admitting: Physical Therapy

## 2022-01-07 DIAGNOSIS — G8929 Other chronic pain: Secondary | ICD-10-CM | POA: Diagnosis not present

## 2022-01-07 DIAGNOSIS — M25562 Pain in left knee: Secondary | ICD-10-CM | POA: Diagnosis not present

## 2022-01-07 DIAGNOSIS — M6281 Muscle weakness (generalized): Secondary | ICD-10-CM

## 2022-01-07 DIAGNOSIS — R2689 Other abnormalities of gait and mobility: Secondary | ICD-10-CM

## 2022-01-07 NOTE — Therapy (Addendum)
[facility] [place] [address] [place], [place], [address] Phone: [phone]   Fax:  [phone]  Physical Therapy Treatment  Patient Details  Name: Angelica [name] MRN: [id number] Date of Birth: 1951-01-07 Referring Provider (PT): Rodney Langton, MD  Rationale for Evaluation and Treatment Rehabilitation  Encounter Date: 01/07/2022   PT End of Session - 01/07/22 1403     Visit Number 3    Number of Visits 16    Date for PT Re-Evaluation 02/24/22    Authorization Type humana-- requested 16 visits    Authorization - Visit Number 3    Progress Note Due on Visit 10    PT Start Time 1403    PT Stop Time 1445    PT Time Calculation (min) 42 min    Activity Tolerance Patient tolerated treatment well    Behavior During Therapy [facility] for tasks assessed/performed             Past Medical History:  Diagnosis Date   Allergy    Anxiety    Asthma    Breast cancer (HCC) 2009   Depression    Dyspnea    with heavy pollen    Hypertension    Hypothyroidism    Postmenopausal    Thyroid disease     Past Surgical History:  Procedure Laterality Date   ADENOIDECTOMY     APPENDECTOMY     BREAST RECONSTRUCTION  06/2008   CHOLECYSTECTOMY N/A 11/12/2017   Procedure: LAPAROSCOPIC CHOLECYSTECTOMY WITH INTRAOPERATIVE CHOLANGIOGRAM ERAS PATHWAY;  Surgeon: Harriette Bouillon, MD;  Location: [facility];  Service: General;  Laterality: N/A;   MASTECTOMY  2009   BrCa   TONSILLECTOMY      There were no vitals filed for this visit.   Subjective Assessment - 01/07/22 1404     Subjective Pt states knee is feeling better -- got a cortisone shot last Friday. Has been able to do exercises. Pt states she did a lot of walking this morning.    Pertinent History asthma, arthritis, HTN, hypercholesteremia    Patient Stated Goals reduce pain, improve walking "I just want to be walking."    Currently in Pain? Yes    Pain Score 2     Pain Location Knee     Pain Orientation Left                [facility] PT Assessment - 01/07/22 0001       Assessment   Medical Diagnosis chronic L knee pain    Referring Provider (PT) Rodney Langton, MD    Onset Date/Surgical Date 12/19/21    Hand Dominance Right    Next MD Visit in 6 weeks    Prior Therapy none      AROM   Right Knee Flexion 130    Left Knee Extension 0    Left Knee Flexion 130                           [facility] Adult PT Treatment/Exercise - 01/07/22 0001       Knee/Hip Exercises: Aerobic   Nustep L5 x 5 min for warm up      Knee/Hip Exercises: Standing   Heel Raises 10 reps;2 sets    Other Standing Knee Exercises partial tandem stance 2x30 sec    Other Standing Knee Exercises feet together solid surface: 2x10 head turns & then head nods      Knee/Hip Exercises: Seated  [employer] 2 sets;10 reps      Knee/Hip Exercises: Supine   Heel Slides 10 reps    Bridges Strengthening;Both;2 sets;10 reps    Straight Leg Raises 2 sets;10 reps      Knee/Hip Exercises: Sidelying   Hip ABduction 2 sets;10 reps    Clams red tband 2x10      Knee/Hip Exercises: Prone   Hamstring Curl 2 sets;10 reps    Hamstring Curl Limitations red tband    Hip Extension Strengthening;Left;10 reps;Right      Manual Therapy   Manual Therapy Taping    Kinesiotex Facilitate Muscle      Kinesiotix   Facilitate Muscle  2 "I" strips around patella following quad tendon                       PT Short Term Goals - 01/07/22 1422       PT SHORT TERM GOAL #1   Title The patient will be indep with HEP.    Time 4    Period Weeks    Status On-going    Target Date 01/25/22      PT SHORT TERM GOAL #2   Title The patient will improve L LE LAQ to full extension.    Time 4    Period Weeks    Status Achieved    Target Date 01/25/22      PT SHORT TERM GOAL #3   Title The patient will tolerate supine heel slide without c/o L knee pain.    Time 4    Period Weeks     Status Achieved    Target Date 01/25/22               PT Long Term Goals - 12/26/21 1340       PT LONG TERM GOAL #1   Title The patient will be indep wiht progression of HEP.    Time 8    Period Weeks    Target Date 02/24/22      PT LONG TERM GOAL #2   Title The patient will imrpove FOTO score from 47% up to 60%.    Time 8    Period Weeks    Target Date 02/24/22      PT LONG TERM GOAL #3   Title The patient will report pain in R knee < or equal to 2/10.    Time 8    Period Weeks    Target Date 02/24/22      PT LONG TERM GOAL #4   Title *measure gait speed-- and improve by 0.5 ft/sec with SPC mod indep    Time 8    Period Weeks    Target Date 02/24/22                   Plan - 01/07/22 1421     Clinical Impression Statement Pt with improved tolerance s/p cortisone shot. Demos full knee ROM. Continued to work on knee strengthening and standing balance. Trial of K-tape to facilitate quad.    Personal Factors and Comorbidities Time since onset of injury/illness/exacerbation;Comorbidity 1;Comorbidity 2    Comorbidities HTN, falls    Examination-Activity Limitations Locomotion Level;Bend;Squat    Examination-Participation Restrictions Community Activity    PT Treatment/Interventions ADLs/Self Care Home Management;Manual techniques;Neuromuscular re-education;Patient/family education;Therapeutic exercise;Therapeutic activities;Taping;Aquatic Therapy;Cryotherapy;Moist Heat;Electrical Stimulation;Dry needling;Balance training;Gait training    PT Next Visit Plan progress HEP, LE strengthening within tolerance    PT Home Exercise Plan [email or web address]  Consulted and Agree with Plan of Care Patient             Patient will benefit from skilled therapeutic intervention in order to improve the following deficits and impairments:  Pain, Impaired flexibility, Decreased range of motion, Decreased strength, Decreased balance, Decreased activity tolerance, Increased fascial  restricitons, Abnormal gait  Visit Diagnosis: Chronic pain of left knee  Muscle weakness (generalized)  Other abnormalities of gait and mobility     Problem List Patient Active Problem List   Diagnosis Date Noted   Primary osteoarthritis of left knee 12/19/2021   Hirsutism 10/19/2021   Night sweats 10/19/2021   Personal history of breast cancer 10/15/2021   Diverticular disease of colon 10/15/2021   Osteopenia 05/10/2021   Injection site reaction 04/04/2020   Rotator cuff syndrome, left 05/26/2019   Neck fullness 08/31/2018   Colon polyps 03/31/2018   Left-sided nosebleed 03/02/2018   TMJ pain dysfunction syndrome 03/02/2018   Calculus of gallbladder 10/21/2017   Elevated lipase 10/21/2017   Diarrhea 10/21/2017   Upper abdominal pain 10/21/2017   Cellulitis and abscess of buttock 06/04/2017   Essential hypertension 06/11/2016   Epistaxis 11/14/2015   Trapezius muscle spasm 09/28/2015   Elevated blood pressure 04/26/2015   Dyslipidemia (high LDL; low HDL) 03/09/2015   IBS (irritable bowel syndrome) 08/22/2014   Insomnia 03/15/2014   Chondromalacia of right patellofemoral joint 02/10/2014   Anxiety and depression 08/03/2013   Episodic mood disorder ([place]) 10/17/2012   Breast cancer ([place]) 01/31/2011   GAD (generalized anxiety disorder) 01/31/2011   Asthma 01/31/2011   Seasonal allergies 01/31/2011   Hypothyroid 01/31/2011    Song Garris April Gordy Levan, PT, DPT 01/07/2022, 2:46 PM  [facility] [address] [place], [place], [address] Phone: [phone]   Fax:  [phone]  Name: MARJA ADDERLEY MRN: [id number] Date of Birth: 02/14/1951

## 2022-01-08 ENCOUNTER — Encounter: Payer: Medicare PPO | Admitting: Physical Therapy

## 2022-01-11 ENCOUNTER — Ambulatory Visit: Payer: Medicare PPO | Admitting: Rehabilitative and Restorative Service Providers"

## 2022-01-11 ENCOUNTER — Encounter: Payer: Self-pay | Admitting: Rehabilitative and Restorative Service Providers"

## 2022-01-11 DIAGNOSIS — R2689 Other abnormalities of gait and mobility: Secondary | ICD-10-CM

## 2022-01-11 DIAGNOSIS — M6281 Muscle weakness (generalized): Secondary | ICD-10-CM

## 2022-01-11 DIAGNOSIS — G8929 Other chronic pain: Secondary | ICD-10-CM | POA: Diagnosis not present

## 2022-01-11 DIAGNOSIS — M25562 Pain in left knee: Secondary | ICD-10-CM | POA: Diagnosis not present

## 2022-01-11 NOTE — Therapy (Signed)
OUTPATIENT PHYSICAL THERAPY TREATMENT NOTE   Patient Name: Angelica [name] MRN: [id number] DOB:1951-04-28, 71 y.o., female Today's Date: 01/11/2022  PCP: Iran Planas, PA-C REFERRING PROVIDER: Silverio Decamp, MD   PT End of Session - 01/11/22 1012     Visit Number 4    Number of Visits 16    Date for PT Re-Evaluation 02/24/22    Authorization Type humana-- requested 16 visits    Authorization - Visit Number 4    Progress Note Due on Visit 10    PT Start Time 1014    PT Stop Time 1100    PT Time Calculation (min) 46 min    Activity Tolerance Patient tolerated treatment well    Behavior During Therapy WFL for tasks assessed/performed             Past Medical History:  Diagnosis Date   Allergy    Anxiety    Asthma    Breast cancer ([place]) 2009   Depression    Dyspnea    with heavy pollen    Hypertension    Hypothyroidism    Postmenopausal    Thyroid disease    Past Surgical History:  Procedure Laterality Date   ADENOIDECTOMY     APPENDECTOMY     BREAST RECONSTRUCTION  06/2008   CHOLECYSTECTOMY N/A 11/12/2017   Procedure: LAPAROSCOPIC CHOLECYSTECTOMY WITH INTRAOPERATIVE CHOLANGIOGRAM ERAS PATHWAY;  Surgeon: Erroll Luna, MD;  Location: [place];  Service: General;  Laterality: N/A;   MASTECTOMY  2009   BrCa   TONSILLECTOMY     Patient Active Problem List   Diagnosis Date Noted   Primary osteoarthritis of left knee 12/19/2021   Hirsutism 10/19/2021   Night sweats 10/19/2021   Personal history of breast cancer 10/15/2021   Diverticular disease of colon 10/15/2021   Osteopenia 05/10/2021   Injection site reaction 04/04/2020   Rotator cuff syndrome, left 05/26/2019   Neck fullness 08/31/2018   Colon polyps 03/31/2018   Left-sided nosebleed 03/02/2018   TMJ pain dysfunction syndrome 03/02/2018   Calculus of gallbladder 10/21/2017   Elevated lipase 10/21/2017   Diarrhea 10/21/2017   Upper abdominal pain 10/21/2017   Cellulitis and abscess of buttock  06/04/2017   Essential hypertension 06/11/2016   Epistaxis 11/14/2015   Trapezius muscle spasm 09/28/2015   Elevated blood pressure 04/26/2015   Dyslipidemia (high LDL; low HDL) 03/09/2015   IBS (irritable bowel syndrome) 08/22/2014   Insomnia 03/15/2014   Chondromalacia of right patellofemoral joint 02/10/2014   Anxiety and depression 08/03/2013   Episodic mood disorder ([place]) 10/17/2012   Breast cancer ([place]) 01/31/2011   GAD (generalized anxiety disorder) 01/31/2011   Asthma 01/31/2011   Seasonal allergies 01/31/2011   Hypothyroid 01/31/2011    REFERRING DIAG: Chronic L knee pain  THERAPY DIAG:  Chronic pain of left knee   Muscle weakness (generalized)   Other abnormalities of gait and mobility  Rationale for Evaluation and Treatment Rehabilitation  PERTINENT HISTORY: The patient reports that she had chronic knee pain and then fell at the gas station when the L knee gave way.  She sustained a concussion and has ongoing HA.  Her knee seems to hurt and then she gets a HA afterwards. She started using a cane, but still feels unsteady.  "I just feel unsteady".  She fell in the kitchen on Saturday due to the L knee giving way.   PMH: asthma, arthritis, HTN, hypercholesteremia   PRECAUTIONS: Fall  SUBJECTIVE: The patient reports her knee is not sore.  She notes some tightness and soreness in the left quadricep muscle. She still feels occasional episodes of L knee giving way.  PAIN:  Are you having pain? Yes: NPRS scale: 2/10 Pain location: knee Pain description: aching, discomfort, sore, throbbing Aggravating factors: walking Relieving factors: sitting Quadriped soreness today in L side    TODAY'S TREATMENT:  01/11/22:  INTERVENTION REPS COMMENTS  There Ex: Nu-step x 5 minutes LEs only level 6 Single limb stance activities dec'ing UE support Heel raises x 20 reps Standing step ups L x 10 reps to 4" step one handrail Trunk rotation supine Hamstring stretch supine   Gait: Adjusted cane height and ambulated working on reciprocal pattern and equal stride X200 ft Stairs x 4 steps one handrail and reciprocal pattern  Manual: STM quad release with superficial massage along lateral quad and IT Band Two strips I tape quad L LE  NMR: Rocker board with bilat UE support with lateral rocking, and foam standing with head turns, foam standing with W exercise x 10 reps for posture/balance     PATIENT EDUCATION: Education details: HEP Person educated: Patient Education method: Consulting civil engineer, Media planner, Verbal cues, and Handouts Education comprehension: verbalized understanding, returned demonstration, and needs further education   HOME EXERCISE PROGRAM: Access Code: [email or web address] URL: [email or web address] Date: 01/11/2022 Prepared by: Rudell Cobb  Exercises - Active Straight Leg Raise with Quad Set  - 2 x daily - 7 x weekly - 1 sets - 5-10 reps - Sidelying Hip Abduction  - 2 x daily - 7 x weekly - 1 sets - 10 reps - Seated Hamstring Stretch  - 2 x daily - 7 x weekly - 1 sets - 10 reps - Roller Massage Elongated IT Band Release  - 1 x daily - 7 x weekly - 1 sets - 1 reps - 2 minutes hold - Sit to Stand with Armchair  - 2 x daily - 7 x weekly - 1 sets - 10 reps - Heel Raises with Counter Support  - 2 x daily - 7 x weekly - 1 sets - 10 reps   PT Short Term Goals       PT SHORT TERM GOAL #1   Title The patient will be indep with HEP.    Time 4    Period Weeks    Status On-going    Target Date 01/25/22      PT SHORT TERM GOAL #2   Title The patient will improve L LE LAQ to full extension.    Time 4    Period Weeks    Status Achieved    Target Date 01/25/22      PT SHORT TERM GOAL #3   Title The patient will tolerate supine heel slide without c/o L knee pain.    Time 4    Period Weeks    Status Achieved    Target Date 01/25/22              PT Long Term Goals       PT LONG TERM GOAL #1   Title The patient will be  indep wiht progression of HEP.    Time 8    Period Weeks    Target Date 02/24/22      PT LONG TERM GOAL #2   Title The patient will imrpove FOTO score from 47% up to 60%.    Time 8    Period Weeks    Target Date 02/24/22      PT LONG TERM GOAL #  3   Title The patient will report pain in R knee < or equal to 2/10.    Time 8    Period Weeks    Target Date 02/24/22      PT LONG TERM GOAL #4   Title *measure gait speed-- and improve by 0.5 ft/sec with SPC mod indep    Time 8    Period Weeks    Target Date 02/24/22               Plan -    Clinical Impression Statement The patient is making significant progress with strength, tolerance to activity, and mobility.  PT progressed HEP to include standing strengthening and stretching.  PT to continue to progress to patient tolerance adding more dynamic standing strength/balance as able.    Personal Factors and Comorbidities Time since onset of injury/illness/exacerbation;Comorbidity 1;Comorbidity 2    Comorbidities HTN, falls    Examination-Activity Limitations Locomotion Level;Bend;Squat    Examination-Participation Restrictions Community Activity    PT Treatment/Interventions ADLs/Self Care Home Management;Manual techniques;Neuromuscular re-education;Patient/family education;Therapeutic exercise;Therapeutic activities;Taping;Aquatic Therapy;Cryotherapy;Moist Heat;Electrical Stimulation;Dry needling;Balance training;Gait training    PT Next Visit Plan Check new HEP to ensure safe, progress gait, progress standing strengthening to tolerance, dynamic balance for functional mobility   Check stgs by 8/4    PT Home Exercise Plan [email or web address]    Consulted and Agree with Plan of Care Patient                [facility], PT 01/11/2022, 10:12 AM

## 2022-01-13 ENCOUNTER — Encounter: Payer: Self-pay | Admitting: Physician Assistant

## 2022-01-13 DIAGNOSIS — E785 Hyperlipidemia, unspecified: Secondary | ICD-10-CM

## 2022-01-13 DIAGNOSIS — F32A Depression, unspecified: Secondary | ICD-10-CM

## 2022-01-14 ENCOUNTER — Encounter: Payer: Self-pay | Admitting: Rehabilitative and Restorative Service Providers"

## 2022-01-14 ENCOUNTER — Ambulatory Visit: Payer: Medicare PPO | Admitting: Rehabilitative and Restorative Service Providers"

## 2022-01-14 DIAGNOSIS — M25562 Pain in left knee: Secondary | ICD-10-CM | POA: Diagnosis not present

## 2022-01-14 DIAGNOSIS — M6281 Muscle weakness (generalized): Secondary | ICD-10-CM

## 2022-01-14 DIAGNOSIS — G8929 Other chronic pain: Secondary | ICD-10-CM | POA: Diagnosis not present

## 2022-01-14 DIAGNOSIS — R2689 Other abnormalities of gait and mobility: Secondary | ICD-10-CM

## 2022-01-14 MED ORDER — ATORVASTATIN CALCIUM 40 MG PO TABS: 40.0000 mg | ORAL_TABLET | Freq: Every day | ORAL | 0 refills | Status: DC

## 2022-01-14 MED ORDER — CLONAZEPAM 0.5 MG PO TABS: 0.5000 mg | ORAL_TABLET | Freq: Two times a day (BID) | ORAL | 1 refills | Status: DC | PRN

## 2022-01-14 NOTE — Therapy (Signed)
OUTPATIENT PHYSICAL THERAPY TREATMENT NOTE   Patient Name: Angelica [name] MRN: [id number] DOB:[birth year], 71 y.o., female Today's Date: 01/14/2022  PCP: Iran Planas, PA-C REFERRING PROVIDER: Silverio Decamp, MD   PT End of Session - 01/14/22 1016     Visit Number 5    Number of Visits 16    Date for PT Re-Evaluation 02/24/22    Authorization Type humana-- requested 16 visits    Authorization - Visit Number 5    Progress Note Due on Visit 10    PT Start Time 1017    PT Stop Time 1100    PT Time Calculation (min) 43 min    Activity Tolerance Patient tolerated treatment well    Behavior During Therapy WFL for tasks assessed/performed             Past Medical History:  Diagnosis Date   Allergy    Anxiety    Asthma    Breast cancer ([place]) 2009   Depression    Dyspnea    with heavy pollen    Hypertension    Hypothyroidism    Postmenopausal    Thyroid disease    Past Surgical History:  Procedure Laterality Date   ADENOIDECTOMY     APPENDECTOMY     BREAST RECONSTRUCTION  06/2008   CHOLECYSTECTOMY N/A 11/12/2017   Procedure: LAPAROSCOPIC CHOLECYSTECTOMY WITH INTRAOPERATIVE CHOLANGIOGRAM ERAS PATHWAY;  Surgeon: Erroll Luna, MD;  Location: [facility];  Service: General;  Laterality: N/A;   MASTECTOMY  2009   BrCa   TONSILLECTOMY     Patient Active Problem List   Diagnosis Date Noted   Primary osteoarthritis of left knee 12/19/2021   Hirsutism 10/19/2021   Night sweats 10/19/2021   Personal history of breast cancer 10/15/2021   Diverticular disease of colon 10/15/2021   Osteopenia 05/10/2021   Injection site reaction 04/04/2020   Rotator cuff syndrome, left 05/26/2019   Neck fullness 08/31/2018   Colon polyps 03/31/2018   Left-sided nosebleed 03/02/2018   TMJ pain dysfunction syndrome 03/02/2018   Calculus of gallbladder 10/21/2017   Elevated lipase 10/21/2017   Diarrhea 10/21/2017   Upper abdominal pain 10/21/2017   Cellulitis and abscess of buttock  06/04/2017   Essential hypertension 06/11/2016   Epistaxis 11/14/2015   Trapezius muscle spasm 09/28/2015   Elevated blood pressure 04/26/2015   Dyslipidemia (high LDL; low HDL) 03/09/2015   IBS (irritable bowel syndrome) 08/22/2014   Insomnia 03/15/2014   Chondromalacia of right patellofemoral joint 02/10/2014   Anxiety and depression 08/03/2013   Episodic mood disorder ([place]) 10/17/2012   Breast cancer ([place]) 01/31/2011   GAD (generalized anxiety disorder) 01/31/2011   Asthma 01/31/2011   Seasonal allergies 01/31/2011   Hypothyroid 01/31/2011    REFERRING DIAG: Chronic L knee pain  THERAPY DIAG:  Chronic pain of left knee   Muscle weakness (generalized)   Other abnormalities of gait and mobility  Rationale for Evaluation and Treatment Rehabilitation  PERTINENT HISTORY: The patient reports that she had chronic knee pain and then fell at the gas station when the L knee gave way.  She sustained a concussion and has ongoing HA.  Her knee seems to hurt and then she gets a HA afterwards. She started using a cane, but still feels unsteady.  "I just feel unsteady".  She fell in the kitchen on Saturday due to the L knee giving way.   PMH: asthma, arthritis, HTN, hypercholesteremia   PRECAUTIONS: Fall  SUBJECTIVE: The patient walked at [employer] x 30 minutes  without her cane and got cramps in the middle of the night. The patient no longer feels the buckling sensation in the knee.  Exercises are going well -- she is doing 2x/day.   PAIN:  Are you having pain? Quadriceps soreness today in L side-- improved from last session.   TODAY'S TREATMENT:  01/14/22:  There Ex: Nu-step x 5 minutes LEs only level 5 Bilateral heel cord stretch x 2 reps x 30 seconds Single limb stance activities dec'ing UE support Side stepping 10 feet x 4 reps R and L sides Standing step ups L x 15 and R x 15 reps to 4" dec'ing UE support to no HRs Hamstring stretch seated x 2 reps x 30 seconds Bridges supine  x 15 reps SLRs supine x 15 reps Sidelying hip abduction x 8 reps-- now easy for patient  Gait: Gait without device x 200 feet  x 3 reps with cues adding dynamic challenges of heel/toe walking, backwards walking and marching with supervision to CGA with gait belt  Direction changing with gait forwards/backwards   NMR: Tandem standing in a corner, partial heel toe adding head turns, single leg standing     PATIENT EDUCATION: Education details: HEP Person educated: Patient Education method: Consulting civil engineer, Media planner, Verbal cues, and Handouts Education comprehension: verbalized understanding, returned demonstration, and needs further education   HOME EXERCISE PROGRAM: Access Code: [email or web address] URL: [email or web address] Date: 01/14/2022 Prepared by: Rudell Cobb  Exercises - Seated Hamstring Stretch  - 2 x daily - 7 x weekly - 1 sets - 10 reps - Roller Massage Elongated IT Band Release  - 1 x daily - 7 x weekly - 1 sets - 1 reps - 2 minutes hold - Sit to Stand with Armchair  - 2 x daily - 7 x weekly - 1 sets - 10 reps - Heel Raises with Counter Support  - 2 x daily - 7 x weekly - 1 sets - 10 reps - Side Stepping with Counter Support  - 2 x daily - 7 x weekly - 1 sets - 10 reps   PT Short Term Goals       PT SHORT TERM GOAL #1   Title The patient will be indep with HEP.    Time 4    Period Weeks    Status  ACHIEVED on 01/14/22    Target Date 01/25/22      PT SHORT TERM GOAL #2   Title The patient will improve L LE LAQ to full extension.    Time 4    Period Weeks    Status Achieved    Target Date 01/25/22      PT SHORT TERM GOAL #3   Title The patient will tolerate supine heel slide without c/o L knee pain.    Time 4    Period Weeks    Status Achieved    Target Date 01/25/22              PT Long Term Goals       PT LONG TERM GOAL #1   Title The patient will be indep with progression of HEP.    Time 8    Period Weeks    Target Date  02/24/22      PT LONG TERM GOAL #2   Title The patient will imrpove FOTO score from 47% up to 60%.    Time 8    Period Weeks ; partially improving to 56% on 01/14/22.   Target Date 02/24/22  PT LONG TERM GOAL #3   Title The patient will report pain in R knee < or equal to 2/10.    Time 8  weeks   Period ACHIEVED on 01/14/22   Target Date 02/24/22      PT LONG TERM GOAL #4   Title *measure gait speed-- and improve by 0.5 ft/sec with SPC mod indep    Time 8    Period Weeks    Target Date 02/24/22               Plan -    Clinical Impression Statement The patient has met all STGs and a LTG.  The patient wants to continue focusing on gait without device, stairs and balance.  PT to continue to progress to patient tolerance adding more dynamic standing strength/balance as able.    Personal Factors and Comorbidities Time since onset of injury/illness/exacerbation;Comorbidity 1;Comorbidity 2    Comorbidities HTN, falls    Examination-Activity Limitations Locomotion Level;Bend;Squat    Examination-Participation Restrictions Community Activity    PT Treatment/Interventions ADLs/Self Care Home Management;Manual techniques;Neuromuscular re-education;Patient/family education;Therapeutic exercise;Therapeutic activities;Taping;Aquatic Therapy;Cryotherapy;Moist Heat;Electrical Stimulation;Dry needling;Balance training;Gait training    PT Next Visit Plan Check new HEP to ensure safe, progress gait, progress standing strengthening to tolerance, dynamic balance for functional mobility     PT Home Exercise Plan [email or web address]    Consulted and Agree with Plan of Care Patient                [place], PT 01/14/2022, 10:17 AM

## 2022-01-15 ENCOUNTER — Ambulatory Visit: Payer: Medicare PPO | Admitting: Physical Therapy

## 2022-01-18 ENCOUNTER — Ambulatory Visit: Payer: Medicare PPO | Admitting: Physical Therapy

## 2022-01-18 ENCOUNTER — Encounter: Payer: Self-pay | Admitting: Physical Therapy

## 2022-01-18 DIAGNOSIS — G8929 Other chronic pain: Secondary | ICD-10-CM | POA: Diagnosis not present

## 2022-01-18 DIAGNOSIS — M6281 Muscle weakness (generalized): Secondary | ICD-10-CM

## 2022-01-18 DIAGNOSIS — M25562 Pain in left knee: Secondary | ICD-10-CM | POA: Diagnosis not present

## 2022-01-18 DIAGNOSIS — R2689 Other abnormalities of gait and mobility: Secondary | ICD-10-CM

## 2022-01-18 NOTE — Therapy (Signed)
OUTPATIENT PHYSICAL THERAPY TREATMENT NOTE   Patient Name: Angelica [name] MRN: [id number] DOB:[birth year], 71 y.o., female Today's Date: 01/18/2022  PCP: Iran Planas, PA-C REFERRING PROVIDER: Silverio Decamp, MD   PT End of Session - 01/18/22 1057     Visit Number 6    Number of Visits 16    Date for PT Re-Evaluation 02/24/22    Authorization - Visit Number 6    Progress Note Due on Visit 10    PT Start Time 8841    PT Stop Time 1057    PT Time Calculation (min) 42 min    Activity Tolerance Patient tolerated treatment well    Behavior During Therapy WFL for tasks assessed/performed              Past Medical History:  Diagnosis Date   Allergy    Anxiety    Asthma    Breast cancer ([place]) 2009   Depression    Dyspnea    with heavy pollen    Hypertension    Hypothyroidism    Postmenopausal    Thyroid disease    Past Surgical History:  Procedure Laterality Date   ADENOIDECTOMY     APPENDECTOMY     BREAST RECONSTRUCTION  06/2008   CHOLECYSTECTOMY N/A 11/12/2017   Procedure: LAPAROSCOPIC CHOLECYSTECTOMY WITH INTRAOPERATIVE CHOLANGIOGRAM ERAS PATHWAY;  Surgeon: Erroll Luna, MD;  Location: [facility];  Service: General;  Laterality: N/A;   MASTECTOMY  2009   BrCa   TONSILLECTOMY     Patient Active Problem List   Diagnosis Date Noted   Primary osteoarthritis of left knee 12/19/2021   Hirsutism 10/19/2021   Night sweats 10/19/2021   Personal history of breast cancer 10/15/2021   Diverticular disease of colon 10/15/2021   Osteopenia 05/10/2021   Injection site reaction 04/04/2020   Rotator cuff syndrome, left 05/26/2019   Neck fullness 08/31/2018   Colon polyps 03/31/2018   Left-sided nosebleed 03/02/2018   TMJ pain dysfunction syndrome 03/02/2018   Calculus of gallbladder 10/21/2017   Elevated lipase 10/21/2017   Diarrhea 10/21/2017   Upper abdominal pain 10/21/2017   Cellulitis and abscess of buttock 06/04/2017   Essential hypertension 06/11/2016    Epistaxis 11/14/2015   Trapezius muscle spasm 09/28/2015   Elevated blood pressure 04/26/2015   Dyslipidemia (high LDL; low HDL) 03/09/2015   IBS (irritable bowel syndrome) 08/22/2014   Insomnia 03/15/2014   Chondromalacia of right patellofemoral joint 02/10/2014   Anxiety and depression 08/03/2013   Episodic mood disorder ([place]) 10/17/2012   Breast cancer ([place]) 01/31/2011   GAD (generalized anxiety disorder) 01/31/2011   Asthma 01/31/2011   Seasonal allergies 01/31/2011   Hypothyroid 01/31/2011    REFERRING DIAG: Chronic L knee pain  THERAPY DIAG:  Chronic pain of left knee   Muscle weakness (generalized)   Other abnormalities of gait and mobility  Rationale for Evaluation and Treatment Rehabilitation  PERTINENT HISTORY: The patient reports that she had chronic knee pain and then fell at the gas station when the L knee gave way.  She sustained a concussion and has ongoing HA.  Her knee seems to hurt and then she gets a HA afterwards. She started using a cane, but still feels unsteady.  "I just feel unsteady".  She fell in the kitchen on Saturday due to the L knee giving way.   PMH: asthma, arthritis, HTN, hypercholesteremia   PRECAUTIONS: Fall  SUBJECTIVE: The patient states she is having no pain and is feeling more confident with gait. She  wants to get rid of the cane  PAIN:  Are you having pain? No   TODAY'S TREATMENT:   01/19/23 Nustep x 5 min level 5 Heel cord stretch 2 x 30 sec Resisted walking green TB 10 x each fwd/bkwd Sidestep with red TB at counter Sit to stand 2 x 10 without UE support  Balance: tandem stance 2 x 30 sec on ground, 2 x 30 sec on foam with intermittent UE support Narrow BOS on foam with head turns vertically/laterally both x 30 sec   Gait: stair negotiation in stairwell with alternating pattern 1 handrail 2 flights Gait 200' without AD with head turns and stop/start Gait speed: 2.2 ft/sec   01/14/22:  There Ex: Nu-step x 5 minutes  LEs only level 5 Bilateral heel cord stretch x 2 reps x 30 seconds Single limb stance activities dec'ing UE support Side stepping 10 feet x 4 reps R and L sides Standing step ups L x 15 and R x 15 reps to 4" dec'ing UE support to no HRs Hamstring stretch seated x 2 reps x 30 seconds Bridges supine x 15 reps SLRs supine x 15 reps Sidelying hip abduction x 8 reps-- now easy for patient  Gait: Gait without device x 200 feet  x 3 reps with cues adding dynamic challenges of heel/toe walking, backwards walking and marching with supervision to CGA with gait belt  Direction changing with gait forwards/backwards   NMR: Tandem standing in a corner, partial heel toe adding head turns, single leg standing       HOME EXERCISE PROGRAM: Access Code: [email or web address] URL: [email or web address] Date: 01/14/2022 Prepared by: Rudell Cobb  Exercises - Seated Hamstring Stretch  - 2 x daily - 7 x weekly - 1 sets - 10 reps - Roller Massage Elongated IT Band Release  - 1 x daily - 7 x weekly - 1 sets - 1 reps - 2 minutes hold - Sit to Stand with Armchair  - 2 x daily - 7 x weekly - 1 sets - 10 reps - Heel Raises with Counter Support  - 2 x daily - 7 x weekly - 1 sets - 10 reps - Side Stepping with Counter Support  - 2 x daily - 7 x weekly - 1 sets - 10 reps   PT Short Term Goals       PT SHORT TERM GOAL #1   Title The patient will be indep with HEP.    Time 4    Period Weeks    Status  ACHIEVED on 01/14/22    Target Date 01/25/22      PT SHORT TERM GOAL #2   Title The patient will improve L LE LAQ to full extension.    Time 4    Period Weeks    Status Achieved    Target Date 01/25/22      PT SHORT TERM GOAL #3   Title The patient will tolerate supine heel slide without c/o L knee pain.    Time 4    Period Weeks    Status Achieved    Target Date 01/25/22              PT Long Term Goals       PT LONG TERM GOAL #1   Title The patient will be indep with progression  of HEP.    Time 8    Period Weeks    Target Date 02/24/22      PT LONG TERM GOAL #2  Title The patient will imrpove FOTO score from 47% up to 60%.    Time 8    Period Weeks ; partially improving to 56% on 01/14/22.   Target Date 02/24/22      PT LONG TERM GOAL #3   Title The patient will report pain in R knee < or equal to 2/10.    Time 8  weeks   Period ACHIEVED on 01/14/22   Target Date 02/24/22      PT LONG TERM GOAL #4   Title Pt will improve gait speed from 2.2 ft/sec to 1.7 ft/sec    Time 8    Period Weeks    Target Date 02/24/22               Plan -    Clinical Impression Statement Pt is progressing well with strength and balance. She feels confident to perform HEP on her own. Decreased to 1x a week next week with hopes of d/c after next visit   Personal Factors and Comorbidities Time since onset of injury/illness/exacerbation;Comorbidity 1;Comorbidity 2    Comorbidities HTN, falls    Examination-Activity Limitations Locomotion Level;Bend;Squat    Examination-Participation Restrictions Community Activity    PT Treatment/Interventions ADLs/Self Care Home Management;Manual techniques;Neuromuscular re-education;Patient/family education;Therapeutic exercise;Therapeutic activities;Taping;Aquatic Therapy;Cryotherapy;Moist Heat;Electrical Stimulation;Dry needling;Balance training;Gait training    PT Next Visit Plan Assess if ready for d/c. Recheck gait speed and FOTO   PT Home Exercise Plan [email or web address]    Consulted and Agree with Plan of Care Patient                ,, PT 01/18/2022, 10:58 AM    

## 2022-01-22 ENCOUNTER — Ambulatory Visit: Payer: Medicare PPO | Admitting: Physical Therapy

## 2022-01-25 ENCOUNTER — Ambulatory Visit: Payer: Medicare PPO | Attending: Sports Medicine | Admitting: Physical Therapy

## 2022-01-25 ENCOUNTER — Encounter: Payer: Self-pay | Admitting: Physical Therapy

## 2022-01-25 DIAGNOSIS — G8929 Other chronic pain: Secondary | ICD-10-CM | POA: Insufficient documentation

## 2022-01-25 DIAGNOSIS — R2689 Other abnormalities of gait and mobility: Secondary | ICD-10-CM | POA: Diagnosis not present

## 2022-01-25 DIAGNOSIS — M6281 Muscle weakness (generalized): Secondary | ICD-10-CM | POA: Diagnosis not present

## 2022-01-25 DIAGNOSIS — M25562 Pain in left knee: Secondary | ICD-10-CM | POA: Diagnosis present

## 2022-01-25 NOTE — Therapy (Signed)
OUTPATIENT PHYSICAL THERAPY TREATMENT NOTE AND DISCHARGE   Patient Name: Angelica [name] MRN: [id number] DOB:1950/09/23, 71 y.o., female Today's Date: 01/25/2022  PCP: Iran Planas, PA-C REFERRING PROVIDER: Silverio Decamp, MD   PT End of Session - 01/25/22 1049     Visit Number 7    Number of Visits 16    Date for PT Re-Evaluation 02/24/22    Authorization - Visit Number 7    Progress Note Due on Visit 10    PT Start Time 1010    PT Stop Time 1050    PT Time Calculation (min) 40 min    Activity Tolerance Patient tolerated treatment well    Behavior During Therapy WFL for tasks assessed/performed               Past Medical History:  Diagnosis Date   Allergy    Anxiety    Asthma    Breast cancer ([place]) 2009   Depression    Dyspnea    with heavy pollen    Hypertension    Hypothyroidism    Postmenopausal    Thyroid disease    Past Surgical History:  Procedure Laterality Date   ADENOIDECTOMY     APPENDECTOMY     BREAST RECONSTRUCTION  06/2008   CHOLECYSTECTOMY N/A 11/12/2017   Procedure: LAPAROSCOPIC CHOLECYSTECTOMY WITH INTRAOPERATIVE CHOLANGIOGRAM ERAS PATHWAY;  Surgeon: Erroll Luna, MD;  Location: [facility];  Service: General;  Laterality: N/A;   MASTECTOMY  2009   BrCa   TONSILLECTOMY     Patient Active Problem List   Diagnosis Date Noted   Primary osteoarthritis of left knee 12/19/2021   Hirsutism 10/19/2021   Night sweats 10/19/2021   Personal history of breast cancer 10/15/2021   Diverticular disease of colon 10/15/2021   Osteopenia 05/10/2021   Injection site reaction 04/04/2020   Rotator cuff syndrome, left 05/26/2019   Neck fullness 08/31/2018   Colon polyps 03/31/2018   Left-sided nosebleed 03/02/2018   TMJ pain dysfunction syndrome 03/02/2018   Calculus of gallbladder 10/21/2017   Elevated lipase 10/21/2017   Diarrhea 10/21/2017   Upper abdominal pain 10/21/2017   Cellulitis and abscess of buttock 06/04/2017   Essential  hypertension 06/11/2016   Epistaxis 11/14/2015   Trapezius muscle spasm 09/28/2015   Elevated blood pressure 04/26/2015   Dyslipidemia (high LDL; low HDL) 03/09/2015   IBS (irritable bowel syndrome) 08/22/2014   Insomnia 03/15/2014   Chondromalacia of right patellofemoral joint 02/10/2014   Anxiety and depression 08/03/2013   Episodic mood disorder ([place]) 10/17/2012   Breast cancer ([place]) 01/31/2011   GAD (generalized anxiety disorder) 01/31/2011   Asthma 01/31/2011   Seasonal allergies 01/31/2011   Hypothyroid 01/31/2011    REFERRING DIAG: Chronic L knee pain  THERAPY DIAG:  Chronic pain of left knee   Muscle weakness (generalized)   Other abnormalities of gait and mobility  Rationale for Evaluation and Treatment Rehabilitation  PERTINENT HISTORY: The patient reports that she had chronic knee pain and then fell at the gas station when the L knee gave way.  She sustained a concussion and has ongoing HA.  Her knee seems to hurt and then she gets a HA afterwards. She started using a cane, but still feels unsteady.  "I just feel unsteady".  She fell in the kitchen on Saturday due to the L knee giving way.   PMH: asthma, arthritis, HTN, hypercholesteremia   PRECAUTIONS: Fall  SUBJECTIVE: The patient states she is able to walk without her cane, feels ready  to d/c  PAIN:  Are you having pain? No   TODAY'S TREATMENT:   01/25/23 Nustep level 5 x 5 min  Sit to stand 2 x 10 Resisted walking fwd/bkwd green TB x 10 Side step red TB at counter Heel cord stretch 2 x 30 sec  Balance: tandem stance on foam 2 x 30 sec Narrow BOS on foam with head turns vertically and laterally 2 x 30 sec Rocker board 1 min each A/P and laterally  FOTO: 85 Gait speed 3.04 ft/sec    01/19/23 Nustep x 5 min level 5 Heel cord stretch 2 x 30 sec Resisted walking green TB 10 x each fwd/bkwd Sidestep with red TB at counter Sit to stand 2 x 10 without UE support  Balance: tandem stance 2 x 30 sec  on ground, 2 x 30 sec on foam with intermittent UE support Narrow BOS on foam with head turns vertically/laterally both x 30 sec   Gait: stair negotiation in stairwell with alternating pattern 1 handrail 2 flights Gait 200' without AD with head turns and stop/start Gait speed: 2.2 ft/sec   HOME EXERCISE PROGRAM: Access Code: [email or web address] URL: [email or web address] Date: 01/25/2022 Prepared by: Isabelle Course  Exercises - Seated Hamstring Stretch  - 2 x daily - 7 x weekly - 1 sets - 10 reps - Roller Massage Elongated IT Band Release  - 1 x daily - 7 x weekly - 1 sets - 1 reps - 2 minutes hold - Sit to Stand with Armchair  - 2 x daily - 7 x weekly - 1 sets - 10 reps - Heel Raises with Counter Support  - 2 x daily - 7 x weekly - 1 sets - 10 reps - Side Stepping with Counter Support  - 2 x daily - 7 x weekly - 1 sets - 10 reps - Standing Tandem Balance with Counter Support  - 1 x daily - 7 x weekly - 1 sets - 10 reps - 10 sec hold   PT Short Term Goals       PT SHORT TERM GOAL #1   Title The patient will be indep with HEP.    Time 4    Period Weeks    Status  ACHIEVED on 01/14/22    Target Date 01/25/22      PT SHORT TERM GOAL #2   Title The patient will improve L LE LAQ to full extension.    Time 4    Period Weeks    Status Achieved    Target Date 01/25/22      PT SHORT TERM GOAL #3   Title The patient will tolerate supine heel slide without c/o L knee pain.    Time 4    Period Weeks    Status Achieved    Target Date 01/25/22              PT Long Term Goals       PT LONG TERM GOAL #1   Title The patient will be indep with progression of HEP.    Time 8    Period Weeks    Target Date 02/24/22  - MET     PT LONG TERM GOAL #2   Title The patient will imrpove FOTO score from 47% up to 60%.    Time 8    Period ACHIEVED 01/25/22   Target Date 02/24/22      PT LONG TERM GOAL #3   Title The patient will report pain in R  knee < or equal to 2/10.     Time 8  weeks   Period ACHIEVED on 01/14/22   Target Date 02/24/22      PT LONG TERM GOAL #4   Title Pt will improve gait speed from 2.2 ft/sec to 1.7 ft/sec    Time 8    Period Weeks    Target Date 02/24/22  MET              Plan -    Clinical Impression Statement Pt has met all goals. She has improved strength, balance and gait and decreased pain. Pt is ready for d/c to HEP   Personal Factors and Comorbidities Time since onset of injury/illness/exacerbation;Comorbidity 1;Comorbidity 2    Comorbidities HTN, falls    Examination-Activity Limitations Locomotion Level;Bend;Squat    Examination-Participation Restrictions Community Activity    PT Treatment/Interventions ADLs/Self Care Home Management;Manual techniques;Neuromuscular re-education;Patient/family education;Therapeutic exercise;Therapeutic activities;Taping;Aquatic Therapy;Cryotherapy;Moist Heat;Electrical Stimulation;Dry needling;Balance training;Gait training    PT Next Visit Plan D/c   PT Home Exercise Plan [email or web address]    Consulted and Agree with Plan of Care Patient             PHYSICAL THERAPY DISCHARGE SUMMARY  Visits from Start of Care: 7  Current functional level related to goals / functional outcomes: Improved strength and mobility, decreased pain   Remaining deficits: See above   Education / Equipment: HEP   Patient agrees to discharge. Patient goals were met. Patient is being discharged due to meeting the stated rehab goals.    Marquia Costello, PT 01/25/2022, 10:51 AM

## 2022-01-29 ENCOUNTER — Ambulatory Visit: Payer: Medicare PPO | Admitting: Physical Therapy

## 2022-02-01 ENCOUNTER — Encounter: Payer: Medicare PPO | Admitting: Physical Therapy

## 2022-02-06 ENCOUNTER — Ambulatory Visit (INDEPENDENT_AMBULATORY_CARE_PROVIDER_SITE_OTHER): Payer: Medicare PPO | Admitting: Sports Medicine

## 2022-02-06 DIAGNOSIS — M1712 Unilateral primary osteoarthritis, left knee: Secondary | ICD-10-CM

## 2022-02-06 NOTE — Progress Notes (Signed)
    Procedures performed today:    None.  Independent interpretation of notes and tests performed by another provider:   None.  Brief History, Exam, Impression, and Recommendations:    Primary osteoarthritis of left knee This is a pleasant 71 year old female returns, chronic left knee pain, MRI showed meniscal fraying, osteoarthritis, we injected her knee last time and she did really well. Has also done well with therapy. Continue conditioning indefinitely, I have recommended Mediterranean diet in the meantime, it is anti-inflammatory and can help her lose some weight, return to see me as needed.    ____________________________________________ Gwen Her. Dianah Field, M.D., ABFM., CAQSM., AME. Primary Care and Sports Medicine [facility]  Adjunct Professor of [employer] of Medicine  Risk manager

## 2022-02-06 NOTE — Assessment & Plan Note (Signed)
This is a pleasant 71 year old female returns, chronic left knee pain, MRI showed meniscal fraying, osteoarthritis, we injected her knee last time and she did really well. Has also done well with therapy. Continue conditioning indefinitely, I have recommended Mediterranean diet in the meantime, it is anti-inflammatory and can help her lose some weight, return to see me as needed.

## 2022-02-18 ENCOUNTER — Ambulatory Visit: Payer: Medicare PPO | Admitting: Sports Medicine

## 2022-02-20 ENCOUNTER — Other Ambulatory Visit: Payer: Self-pay | Admitting: Physician Assistant

## 2022-03-01 ENCOUNTER — Encounter: Payer: Self-pay | Admitting: Physician Assistant

## 2022-03-01 DIAGNOSIS — K580 Irritable bowel syndrome with diarrhea: Secondary | ICD-10-CM

## 2022-03-01 MED ORDER — DICYCLOMINE HCL 10 MG PO CAPS: 10.0000 mg | ORAL_CAPSULE | Freq: Two times a day (BID) | ORAL | 1 refills | Status: DC

## 2022-03-01 NOTE — Telephone Encounter (Signed)
See other message

## 2022-03-06 DIAGNOSIS — S199XXA Unspecified injury of neck, initial encounter: Secondary | ICD-10-CM | POA: Diagnosis not present

## 2022-03-06 DIAGNOSIS — Z87891 Personal history of nicotine dependence: Secondary | ICD-10-CM | POA: Diagnosis not present

## 2022-03-06 DIAGNOSIS — Z881 Allergy status to other antibiotic agents status: Secondary | ICD-10-CM | POA: Diagnosis not present

## 2022-03-06 DIAGNOSIS — R296 Repeated falls: Secondary | ICD-10-CM | POA: Diagnosis not present

## 2022-03-06 DIAGNOSIS — S0083XA Contusion of other part of head, initial encounter: Secondary | ICD-10-CM | POA: Diagnosis not present

## 2022-03-06 DIAGNOSIS — Z880 Allergy status to penicillin: Secondary | ICD-10-CM | POA: Diagnosis not present

## 2022-03-06 DIAGNOSIS — Z882 Allergy status to sulfonamides status: Secondary | ICD-10-CM | POA: Diagnosis not present

## 2022-03-06 DIAGNOSIS — S8992XA Unspecified injury of left lower leg, initial encounter: Secondary | ICD-10-CM | POA: Diagnosis not present

## 2022-03-06 DIAGNOSIS — Z853 Personal history of malignant neoplasm of breast: Secondary | ICD-10-CM | POA: Diagnosis not present

## 2022-03-06 DIAGNOSIS — N189 Chronic kidney disease, unspecified: Secondary | ICD-10-CM | POA: Diagnosis not present

## 2022-03-06 DIAGNOSIS — S0993XA Unspecified injury of face, initial encounter: Secondary | ICD-10-CM | POA: Diagnosis not present

## 2022-03-06 DIAGNOSIS — R42 Dizziness and giddiness: Secondary | ICD-10-CM | POA: Diagnosis not present

## 2022-03-08 ENCOUNTER — Encounter: Payer: Self-pay | Admitting: Physician Assistant

## 2022-03-08 ENCOUNTER — Ambulatory Visit (INDEPENDENT_AMBULATORY_CARE_PROVIDER_SITE_OTHER): Payer: Medicare PPO | Admitting: Physician Assistant

## 2022-03-08 VITALS — Ht 63.0 in | Wt 166.0 lb

## 2022-03-08 DIAGNOSIS — G44319 Acute post-traumatic headache, not intractable: Secondary | ICD-10-CM

## 2022-03-08 DIAGNOSIS — F0781 Postconcussional syndrome: Secondary | ICD-10-CM

## 2022-03-08 DIAGNOSIS — W19XXXD Unspecified fall, subsequent encounter: Secondary | ICD-10-CM | POA: Diagnosis not present

## 2022-03-08 DIAGNOSIS — R42 Dizziness and giddiness: Secondary | ICD-10-CM | POA: Diagnosis not present

## 2022-03-08 DIAGNOSIS — I10 Essential (primary) hypertension: Secondary | ICD-10-CM | POA: Diagnosis not present

## 2022-03-08 DIAGNOSIS — W19XXXA Unspecified fall, initial encounter: Secondary | ICD-10-CM | POA: Insufficient documentation

## 2022-03-08 MED ORDER — KETOROLAC TROMETHAMINE 30 MG/ML IJ SOLN
60.0000 mg | Freq: Once | INTRAMUSCULAR | Status: AC
  Administered 2022-03-08: 60 mg via INTRAMUSCULAR

## 2022-03-08 MED ORDER — ONDANSETRON 8 MG PO TBDP: 8.0000 mg | ORAL_TABLET | Freq: Three times a day (TID) | ORAL | 0 refills | Status: DC | PRN

## 2022-03-08 NOTE — Patient Instructions (Addendum)
Start back lisinopril '20mg'$  daily(1/2 your '40mg'$  tablet)  Get BP cuff and keep log Ok to take ibuprofen for pain and ice area on back on head Make sure eating and drinking Avoid mental and physical exertion Zofran as needed for nausea   Post-Concussion Syndrome  A concussion is a brain injury from a direct hit to the head or body. This hit causes the brain to shake quickly back and forth inside the skull. This can damage brain cells and cause chemical changes in the brain. Concussions are usually not life-threatening but can cause serious symptoms. Post-concussion syndrome is when symptoms that occur after a concussion last longer than normal. These symptoms can last from weeks to months. What are the causes? The cause of this condition is not known. It can happen whether your head injury was mild or severe. What increases the risk? You are more likely to develop this condition if: You are female. You are a child, teen, or young adult. You have had a past head injury. You have a history of headaches. You have depression or anxiety. You have loss of consciousness or cannot remember the event (have amnesia of the event). You have multiple symptoms or severe symptoms at the time of your concussion. What are the signs or symptoms? Symptoms of this condition include: Physical symptoms. You may have: Headaches. Tiredness. Dizziness and weakness. Blurry vision and sensitivity to light. Hearing difficulties. Problems with balance. Mental and emotional symptoms. You may have: Memory problems and trouble concentrating. Difficulty sleeping or staying asleep. Feelings of irritability. Anxiety or depression. Difficulty learning new things. How is this diagnosed? This condition may be diagnosed based on: Your symptoms. A description of your injury. Your medical history. Testing your strength, balance, and nerve function (neurological examination). Your health care provider may order other  tests, including brain imaging such as a CT scan or an MRI, and memory testing (neuropsychological testing). How is this treated? Treatment for this condition may depend on your symptoms. Symptoms usually go away on their own over time. Treatments may include: Medicines for headaches, anxiety, depression, and trouble sleeping (insomnia). Resting your brain and body for a few days after your injury. Rehabilitation therapy, such as: Physical or occupational therapy. This may include exercises to help with balance and dizziness. Mental health counseling. A form of talk therapy called cognitive behavioral therapy (CBT) can be especially helpful. This therapy helps you set goals and follow up on the changes that you make. Speech therapy. Vision therapy. A brain and eye specialist can recommend treatments for vision problems. Follow these instructions at home: Medicines Take over-the-counter and prescription medicines only as told by your health care provider. Avoid opioid prescription pain medicines when recovering from a concussion. Activity Limit your mental activities for the first few days after your injury. This may include not doing the following: Homework or job-related work. Complex thinking. Watching TV, and using a computer or phone. Playing memory games and puzzles. Gradually return to your normal activity level. If a certain activity brings on your symptoms, stop or slow down until you can do the activity without it triggering your symptoms. Limit physical activity, such as exercise or sports, for the first few days after a concussion. Gradually return to normal activity as told by your health care provider. Rest. Rest helps your brain heal. Make sure you: Get plenty of sleep at night. Most adults should get at least 7-9 hours of sleep each night. Rest during the day. Take naps or rest  breaks when you feel tired. Do not do high-risk activities that could cause a second concussion,  such as riding a bike or playing sports. Having another concussion before the first one has healed can be dangerous. General instructions  Do not drink alcohol until your health care provider says that you can. Keep track of the frequency and the severity of your symptoms. Give this information to your health care provider. Keep all follow-up visits as directed by your health care provider. This is important. This includes visits with specialists. Contact a health care provider if: Your symptoms do not improve. You have another injury. Get help right away if you: Have a severe or worsening headache. Are confused. Have trouble staying awake. Faint. Vomit. Have weakness or numbness in any part of your body. Have a seizure. Have trouble speaking. Summary Post-concussion syndrome is when symptoms that occur after a concussion last longer than normal. Symptoms usually go away on their own over time. Depending on your symptoms, you may need treatment, such as medicines or rehabilitation therapy. Rest your brain and body for a few days after your injury. Gradually return to normal activities as told by your health care provider. Get plenty of sleep, and avoid alcohol and opioid pain medicines while recovering from a concussion. This information is not intended to replace advice given to you by your health care provider. Make sure you discuss any questions you have with your health care provider. Document Revised: 08/24/2020 Document Reviewed: 08/24/2020 Elsevier Patient Education  [place].

## 2022-03-08 NOTE — Progress Notes (Signed)
Acute Office Visit  Subjective:     Patient ID: Angelica [name], female    DOB: [birth year], 71 y.o.   MRN: [id number]  Chief Complaint  Patient presents with   Fall    HPI Patient is in today for follow up from ED for fall and head trauma on 03/06/2022.   She was in the shower when she started to feel dizzy and when she went to get out she fell forwards and hit the left side of her face on the toilet and ground. No LOC.   She did have normal CT of face/brain/spine. Her blood pressure medication was stopped in ED since speculation that she feel due to low BP.   She has been pretty sleepy since the fall. She has a dull headache but refuses to take anything. She is not checking BP at home due to not having a cuff. She is a little nauseated but no vomiting. She continues to feel a little dizzy.   .. Active Ambulatory Problems    Diagnosis Date Noted   Breast cancer ([place]) 01/31/2011   GAD (generalized anxiety disorder) 01/31/2011   Asthma 01/31/2011   Seasonal allergies 01/31/2011   Hypothyroid 01/31/2011   Episodic mood disorder ([place]) 10/17/2012   Anxiety and depression 08/03/2013   Chondromalacia of right patellofemoral joint 02/10/2014   Insomnia 03/15/2014   IBS (irritable bowel syndrome) 08/22/2014   Dyslipidemia (high LDL; low HDL) 03/09/2015   Elevated blood pressure 04/26/2015   Trapezius muscle spasm 09/28/2015   Epistaxis 11/14/2015   Essential hypertension 06/11/2016   Cellulitis and abscess of buttock 06/04/2017   Calculus of gallbladder 10/21/2017   Elevated lipase 10/21/2017   Diarrhea 10/21/2017   Upper abdominal pain 10/21/2017   Left-sided nosebleed 03/02/2018   TMJ pain dysfunction syndrome 03/02/2018   Colon polyps 03/31/2018   Neck fullness 08/31/2018   Rotator cuff syndrome, left 05/26/2019   Injection site reaction 04/04/2020   Osteopenia 05/10/2021   Personal history of breast cancer 10/15/2021   Diverticular disease of colon 10/15/2021    Hirsutism 10/19/2021   Night sweats 10/19/2021   Primary osteoarthritis of left knee 12/19/2021   Fall 03/08/2022   Resolved Ambulatory Problems    Diagnosis Date Noted   OAB (overactive bladder) 03/08/2015   Vitamin D deficiency 03/09/2015   Impingement syndrome of left shoulder 08/28/2015   Acute upper respiratory infection 12/15/2015   Past Medical History:  Diagnosis Date   Allergy    Anxiety    Asthma    Depression    Dyspnea    Hypertension    Hypothyroidism    Postmenopausal    Thyroid disease      ROS  See HPI.     Objective:    Ht '5\' 3"'$  (1.6 m)   Wt 166 lb (75.3 kg)   LMP 06/24/2000   SpO2 100%   BMI 29.41 kg/m  BP Readings from Last 3 Encounters:  12/08/21 (!) 111/54  11/20/21 (!) 135/42  10/16/21 (!) 137/53   Wt Readings from Last 3 Encounters:  03/08/22 166 lb (75.3 kg)  12/08/21 168 lb (76.2 kg)  11/20/21 165 lb (74.8 kg)      Physical Exam Constitutional:      Appearance: Normal appearance.  HENT:     Head: Normocephalic.     Comments: No lacerations to the head. Tenderness over the posterior occipital head to touch.  Cardiovascular:     Rate and Rhythm: Normal rate.  Pulmonary:  Effort: Pulmonary effort is normal.  Musculoskeletal:     Right lower leg: No edema.     Left lower leg: No edema.  Neurological:     General: No focal deficit present.     Mental Status: She is alert and oriented to person, place, and time.  Psychiatric:        Mood and Affect: Mood normal.     ..Orthostatic VS for the past 24 hrs (Last 3 readings):  BP- Lying Pulse- Lying BP- Sitting Pulse- Sitting BP- Standing at 0 minutes Pulse- Standing at 0 minutes  03/08/22 1006 167/58 62 165/63 61 148/63 68         Assessment & Plan:  Marland KitchenMarland KitchenKaylise was seen today for fall.  Diagnoses and all orders for this visit:  Post concussion syndrome -     ondansetron (ZOFRAN-ODT) 8 MG disintegrating tablet; Take 1 tablet (8 mg total) by mouth every 8 (eight) hours  as needed for nausea. -     ketorolac (TORADOL) 30 MG/ML injection 60 mg  Fall, subsequent encounter  Dizziness  Acute post-traumatic headache, not intractable -     ketorolac (TORADOL) 30 MG/ML injection 60 mg  Essential hypertension  Discussed post concussion syndrome.  Encouraged patient to avoid physical and mental rest.  Discussed symptoms could take a few weeks to completely resolve Zofran for nausea Ok for tylenol and ibuprofen for headache Make sure to ice head and left side of face Toradol '60mg'$  given IM for headache Start back lisinopril '20mg'$  for BP That is half what she was on before Get BP cuff and start checking bid and bring in for recheck in 2 weeks Encouraged patient to drink plenty of fluids and make sure she eating protein  No driving for one week Her BP is dropping a little when she stands, warning to watch for increased fall risk  Spent 32 minutes with patient reviewing chart and discuss concussion and treatment.     Return in about 1 week (around 03/15/2022) for concussion follow up.  Iran Planas, PA-C

## 2022-03-15 ENCOUNTER — Encounter: Payer: Self-pay | Admitting: Physician Assistant

## 2022-03-15 ENCOUNTER — Ambulatory Visit (INDEPENDENT_AMBULATORY_CARE_PROVIDER_SITE_OTHER): Payer: Medicare PPO | Admitting: Physician Assistant

## 2022-03-15 VITALS — BP 130/61 | HR 58 | Ht 63.0 in | Wt 168.0 lb

## 2022-03-15 DIAGNOSIS — R2681 Unsteadiness on feet: Secondary | ICD-10-CM

## 2022-03-15 DIAGNOSIS — F0781 Postconcussional syndrome: Secondary | ICD-10-CM | POA: Diagnosis not present

## 2022-03-15 DIAGNOSIS — I10 Essential (primary) hypertension: Secondary | ICD-10-CM | POA: Diagnosis not present

## 2022-03-15 DIAGNOSIS — R2689 Other abnormalities of gait and mobility: Secondary | ICD-10-CM

## 2022-03-15 DIAGNOSIS — W19XXXD Unspecified fall, subsequent encounter: Secondary | ICD-10-CM | POA: Diagnosis not present

## 2022-03-15 MED ORDER — LISINOPRIL 20 MG PO TABS: ORAL_TABLET | ORAL | 0 refills | Status: DC

## 2022-03-15 NOTE — Progress Notes (Signed)
   Established Patient Office Visit  Subjective   Patient ID: Angelica [name], female    DOB: [birth year]  Age: 71 y.o. MRN: [id number]  Chief Complaint  Patient presents with   Follow-up    HPI Pt is a 71 y/o F presenting for a post-concussive syndrome f/u. She is doing better and is no longer experiencing any nausea or dizziness, and has not needed Zofran anymore. She does still feel tired and have the headaches which are improving and relieved with ibuprofen. She notices the headaches are worst when she wakes up in the morning and if she tries to do too much, but rests when that happens. She is continuing to rest physically and mentally and has been checking her BP regularly. Her daughter is concerned that her gait is off balance and wants her to use a cane. No dizziness when turning her head side to side and no muscle weakness reported.    ROS See HPI    Objective:     BP 130/61 Comment: home blood pressure machine  Pulse (!) 58   Ht '5\' 3"'$  (1.6 m)   Wt 76.2 kg   LMP 06/24/2000   BMI 29.76 kg/m  BP Readings from Last 3 Encounters:  03/15/22 130/61  12/08/21 (!) 111/54  11/20/21 (!) 135/42      Physical Exam Constitutional:      Appearance: Normal appearance.  Cardiovascular:     Rate and Rhythm: Normal rate and regular rhythm.     Pulses: Normal pulses.  Pulmonary:     Effort: Pulmonary effort is normal.  Neurological:     General: No focal deficit present.     Mental Status: She is alert and oriented to person, place, and time.     Sensory: No sensory deficit.     Motor: No weakness.     Coordination: Coordination abnormal.     Gait: Gait abnormal.     Comments: 5/5 strength of lower extremities Difficulty walking heel-to-toe  Slightly wobbly with Romberg test   Psychiatric:        Mood and Affect: Mood normal.        Assessment & Plan:  Marland KitchenMarland KitchenFabiana was seen today for follow-up.  Diagnoses and all orders for this visit:  Post concussion syndrome -      Ambulatory referral to Physical Therapy  Gait instability -     Ambulatory referral to Physical Therapy  Balance problems -     Ambulatory referral to Physical Therapy   Continue ibuprofen and ice for headaches PRN. Educated on typical timeframe of post-concussive symptoms and may take a few weeks to improve.  Pt brought BP log with her to visit showing good systolics but diastolics in the high 69C-78L. Will stay on lisinopril '20mg'$  but also increase hydration and salt intake in diet to keep diastolic up.  Will refer to PT for gait and coordination. Encouraged use of the cane when walking.  She is safe to drive minimally at this time considering no dizziness or weakness and improvement overall, but encouraged to take it easy.  Follow up as needed if any new or worsening symptoms.   Follow up in 4-6 weeks of after released from PT.

## 2022-03-15 NOTE — Progress Notes (Signed)
Home Blood Pressure Readings: 03/08/2022 125/58  75  03/09/2022 127/58  77   113/61  75  03/10/2022 156/67  77   137/70  61   133/57  71  03/11/2022 164/59  73   124/50  73   138/55  61  03/12/2022 152/58  64   143/62  65   128/51  69  03/13/2022 157/58  62   136/55  73   118/66  68  03/14/2022 156/58  71   167/55  65   138/57  64  03/15/2022 156/66  68

## 2022-03-25 ENCOUNTER — Other Ambulatory Visit: Payer: Self-pay

## 2022-03-25 ENCOUNTER — Ambulatory Visit: Payer: Medicare Other | Attending: Physician Assistant | Admitting: Physical Therapy

## 2022-03-25 DIAGNOSIS — M6281 Muscle weakness (generalized): Secondary | ICD-10-CM | POA: Diagnosis not present

## 2022-03-25 DIAGNOSIS — R2681 Unsteadiness on feet: Secondary | ICD-10-CM | POA: Diagnosis not present

## 2022-03-25 DIAGNOSIS — R2689 Other abnormalities of gait and mobility: Secondary | ICD-10-CM | POA: Diagnosis not present

## 2022-03-25 DIAGNOSIS — M25562 Pain in left knee: Secondary | ICD-10-CM | POA: Insufficient documentation

## 2022-03-25 DIAGNOSIS — G8929 Other chronic pain: Secondary | ICD-10-CM | POA: Insufficient documentation

## 2022-03-25 DIAGNOSIS — Z9181 History of falling: Secondary | ICD-10-CM | POA: Diagnosis not present

## 2022-03-25 DIAGNOSIS — F0781 Postconcussional syndrome: Secondary | ICD-10-CM | POA: Diagnosis present

## 2022-03-25 NOTE — Therapy (Signed)
OUTPATIENT PHYSICAL THERAPY NEURO EVALUATION   Patient Name: Angelica [name] MRN: [id number] DOB:1950-06-27, 71 y.o., female Today's Date: 03/25/2022   PCP: Lavada Mesi REFERRING PROVIDER: Donella Stade, PA-C   PT End of Session - 03/25/22 1255     Visit Number 1    Number of Visits 7    Date for PT Re-Evaluation 05/20/22    Authorization Type [employer]    Progress Note Due on Visit 10    PT Start Time 1150    PT Stop Time 1230    PT Time Calculation (min) 40 min    Activity Tolerance Patient tolerated treatment well    Behavior During Therapy WFL for tasks assessed/performed             Past Medical History:  Diagnosis Date   Allergy    Anxiety    Asthma    Breast cancer ([place]) 2009   Depression    Dyspnea    with heavy pollen    Hypertension    Hypothyroidism    Postmenopausal    Thyroid disease    Past Surgical History:  Procedure Laterality Date   ADENOIDECTOMY     APPENDECTOMY     BREAST RECONSTRUCTION  06/2008   CHOLECYSTECTOMY N/A 11/12/2017   Procedure: LAPAROSCOPIC CHOLECYSTECTOMY WITH INTRAOPERATIVE CHOLANGIOGRAM ERAS PATHWAY;  Surgeon: Erroll Luna, MD;  Location: [facility];  Service: General;  Laterality: N/A;   MASTECTOMY  2009   BrCa   TONSILLECTOMY     Patient Active Problem List   Diagnosis Date Noted   Fall 03/08/2022   Primary osteoarthritis of left knee 12/19/2021   Hirsutism 10/19/2021   Night sweats 10/19/2021   Personal history of breast cancer 10/15/2021   Diverticular disease of colon 10/15/2021   Osteopenia 05/10/2021   Injection site reaction 04/04/2020   Rotator cuff syndrome, left 05/26/2019   Neck fullness 08/31/2018   Colon polyps 03/31/2018   Left-sided nosebleed 03/02/2018   TMJ pain dysfunction syndrome 03/02/2018   Calculus of gallbladder 10/21/2017   Elevated lipase 10/21/2017   Diarrhea 10/21/2017   Upper abdominal pain 10/21/2017   Cellulitis and abscess of buttock 06/04/2017   Essential hypertension  06/11/2016   Epistaxis 11/14/2015   Trapezius muscle spasm 09/28/2015   Elevated blood pressure 04/26/2015   Dyslipidemia (high LDL; low HDL) 03/09/2015   IBS (irritable bowel syndrome) 08/22/2014   Insomnia 03/15/2014   Chondromalacia of right patellofemoral joint 02/10/2014   Anxiety and depression 08/03/2013   Episodic mood disorder ([place]) 10/17/2012   Breast cancer ([place]) 01/31/2011   GAD (generalized anxiety disorder) 01/31/2011   Asthma 01/31/2011   Seasonal allergies 01/31/2011   Hypothyroid 01/31/2011    ONSET DATE: 3 weeks ago  REFERRING DIAG:  F07.81 (ICD-10-CM) - Post concussion syndrome  R26.81 (ICD-10-CM) - Gait instability  R26.89 (ICD-10-CM) - Balance problems    THERAPY DIAG:  Unsteadiness on feet  Other abnormalities of gait and mobility  History of falling  Rationale for Evaluation and Treatment Rehabilitation  SUBJECTIVE:  SUBJECTIVE STATEMENT: Pt reports hitting her head after slipping coming out of the shower. Pt states she got a concussion. Head no longer hurts. Endorses no pain or dizziness. Pt accompanied by: self  PERTINENT HISTORY: PT for knee pain  PAIN:  Are you having pain? No  PRECAUTIONS: Fall  WEIGHT BEARING RESTRICTIONS No  FALLS: Has patient fallen in last 6 months? Yes. Number of falls 1  LIVING ENVIRONMENT: Lives with: lives with their family Lives in: House/apartment Stairs: Yes: Internal: 12 steps; on left going up Has following equipment at home: None  PLOF: Independent  PATIENT GOALS Improve balance  OBJECTIVE:   DIAGNOSTIC FINDINGS: n/a  COGNITION: Overall cognitive status: Within functional limits for tasks assessed   SENSATION: WFL  COORDINATION: WFL  MUSCLE LENGTH: Did not assess  DTRs:  Did not  assess  POSTURE: No Significant postural limitations  LOWER EXTREMITY ROM:     Active  Right Eval Left Eval  Hip flexion    Hip extension    Hip abduction    Hip adduction    Hip internal rotation    Hip external rotation    Knee flexion    Knee extension    Ankle dorsiflexion    Ankle plantarflexion    Ankle inversion    Ankle eversion     (Blank rows = not tested)  LOWER EXTREMITY MMT:    MMT Right Eval Left Eval  Hip flexion 5 5  Hip extension    Hip abduction 4 4  Hip adduction    Hip internal rotation    Hip external rotation    Knee flexion 5 5  Knee extension 5 5  Ankle dorsiflexion    Ankle plantarflexion    Ankle inversion    Ankle eversion    (Blank rows = not tested)  TRANSFERS: Assistive device utilized: None  Sit to stand: Complete Independence Stand to sit: Complete Independence Chair to chair: Complete Independence Floor: Complete Independence  RAMP:  Not tested  CURB:  Not tested  STAIRS:  Level of Assistance: Modified independence  Stair Negotiation Technique: Alternating Pattern  with Bilateral Rails  Number of Stairs: 10   Height of Stairs: 4-6"  Comments: Reciprocal pattern  GAIT: Gait pattern: WFL Distance walked: 100' Assistive device utilized: None Level of assistance: Complete Independence Comments: none  FUNCTIONAL TESTs:  5 times sit to stand: 11 sec Timed up and go (TUG): 14 sec Berg Balance Scale: 48 Functional gait assessment: 19 MCTSIB: Condition 1: Avg of 3 trials: 30 sec, Condition 2: Avg of 3 trials: 30 sec, Condition 3: Avg of 3 trials: 30 sec, Condition 4: Avg of 3 trials: 20 sec, and Total Score: 110/120   [facility] PT Assessment - 03/25/22 0001       Standardized Balance Assessment   Standardized Balance Assessment Berg Balance Test      Berg Balance Test   Sit to Stand Able to stand without using hands and stabilize independently    Standing Unsupported Able to stand safely 2 minutes    Sitting with  Back Unsupported but Feet Supported on Floor or Stool Able to sit safely and securely 2 minutes    Stand to Sit Sits safely with minimal use of hands    Transfers Able to transfer safely, minor use of hands    Standing Unsupported with Eyes Closed Able to stand 10 seconds safely    Standing Unsupported with Feet Together Able to place feet together independently and stand 1 minute  safely    From Standing, Reach Forward with Outstretched Arm Can reach forward >12 cm safely (5")    From Standing Position, Pick up Object from [place] to pick up shoe safely and easily    From Standing Position, Turn to Look Behind Over each Shoulder Turn sideways only but maintains balance    Turn 360 Degrees Able to turn 360 degrees safely one side only in 4 seconds or less   4 sec turning R   Standing Unsupported, Alternately Place Feet on Step/Stool Able to stand independently and safely and complete 8 steps in 20 seconds    Standing Unsupported, One Foot in Front Able to plae foot ahead of the other independently and hold 30 seconds    Standing on One Leg Tries to lift leg/unable to hold 3 seconds but remains standing independently   2 sec on R; 10 sec on L   Total Score 48    Berg comment: 48/56      Functional Gait  Assessment   Gait assessed  Yes    Gait Level Surface Walks 20 ft in less than 7 sec but greater than 5.5 sec, uses assistive device, slower speed, mild gait deviations, or deviates 6-10 in outside of the 12 in walkway width.    Change in Gait Speed Able to smoothly change walking speed without loss of balance or gait deviation. Deviate no more than 6 in outside of the 12 in walkway width.    Gait with Horizontal Head Turns Performs head turns smoothly with slight change in gait velocity (eg, minor disruption to smooth gait path), deviates 6-10 in outside 12 in walkway width, or uses an assistive device.    Gait with Vertical Head Turns Performs head turns with no change in gait. Deviates no more  than 6 in outside 12 in walkway width.    Gait and Pivot Turn Pivot turns safely within 3 sec and stops quickly with no loss of balance.    Step Over Obstacle Is able to step over one shoe box (4.5 in total height) but must slow down and adjust steps to clear box safely. May require verbal cueing.    Gait with Narrow Base of Support Ambulates 4-7 steps.    Gait with Eyes Closed Walks 20 ft, slow speed, abnormal gait pattern, evidence for imbalance, deviates 10-15 in outside 12 in walkway width. Requires more than 9 sec to ambulate 20 ft.    Ambulating Backwards Walks 20 ft, slow speed, abnormal gait pattern, evidence for imbalance, deviates 10-15 in outside 12 in walkway width.    Steps Alternating feet, must use rail.    Total Score 19    FGA comment: 19/30              PATIENT SURVEYS:  ABC scale 83.12%  TODAY'S TREATMENT:  None initiated   PATIENT EDUCATION: Education details: Exam findings, POC Person educated: Patient Education method: Explanation Education comprehension: verbalized understanding and needs further education   HOME EXERCISE PROGRAM: To be initiated next session    GOALS: Goals reviewed with patient? Yes   LONG TERM GOALS: Target date: 05/06/2022  Pt will be ind with balance HEP Baseline:  Goal status: INITIAL  2.  Pt will demo at least 22/30 on FGA for decreased fall risk Baseline:  Goal status: INITIAL  3.  Pt will have increased Berg Balance Score to at least 54/56 for low fall risk Baseline:  Goal status: INITIAL  4.  Pt will  have improved ABC to at least 90% Baseline:  Goal status: INITIAL    ASSESSMENT:  CLINICAL IMPRESSION: Patient is a 71 y.o. F who was seen today for physical therapy evaluation and treatment for fall resulting in concussion. On assessment pt's FGA demos high fall risk and Berg Balance Score demos moderate fall risk. No overt new weakness noted or dizziness. Pt would benefit from PT to improve her dynamic and  static balance to maximize her level of function.   OBJECTIVE IMPAIRMENTS decreased balance, decreased coordination, and decreased mobility.   ACTIVITY LIMITATIONS carrying, lifting, stairs, and locomotion level  PARTICIPATION LIMITATIONS: cleaning, community activity, and yard work  PERSONAL FACTORS Age, Past/current experiences, and Time since onset of injury/illness/exacerbation are also affecting patient's functional outcome.   REHAB POTENTIAL: Good  CLINICAL DECISION MAKING: Stable/uncomplicated  EVALUATION COMPLEXITY: Low  PLAN: PT FREQUENCY: 1x/week  PT DURATION: 6 weeks  PLANNED INTERVENTIONS: Therapeutic exercises, Therapeutic activity, Neuromuscular re-education, Balance training, Gait training, Patient/Family education, Self Care, Joint mobilization, Stair training, Aquatic Therapy, Electrical stimulation, Cryotherapy, Moist heat, Taping, Ionotophoresis 69m/ml Dexamethasone, Manual therapy, and Re-evaluation  PLAN FOR NEXT SESSION: Initiate balance HEP. Consider OTAGO. Work primarily on dynamic balance, single leg stance and multi directional stepping   Yacine Garriga April [place] PT, DPT 03/25/2022, 12:55 PM

## 2022-03-26 ENCOUNTER — Other Ambulatory Visit: Payer: Self-pay | Admitting: Physician Assistant

## 2022-03-26 DIAGNOSIS — Z1231 Encounter for screening mammogram for malignant neoplasm of breast: Secondary | ICD-10-CM

## 2022-04-03 ENCOUNTER — Ambulatory Visit: Payer: Medicare Other | Admitting: Physical Therapy

## 2022-04-03 DIAGNOSIS — G8929 Other chronic pain: Secondary | ICD-10-CM

## 2022-04-03 DIAGNOSIS — R2689 Other abnormalities of gait and mobility: Secondary | ICD-10-CM | POA: Diagnosis not present

## 2022-04-03 DIAGNOSIS — Z9181 History of falling: Secondary | ICD-10-CM | POA: Diagnosis not present

## 2022-04-03 DIAGNOSIS — M6281 Muscle weakness (generalized): Secondary | ICD-10-CM | POA: Diagnosis not present

## 2022-04-03 DIAGNOSIS — M25562 Pain in left knee: Secondary | ICD-10-CM | POA: Diagnosis not present

## 2022-04-03 DIAGNOSIS — R2681 Unsteadiness on feet: Secondary | ICD-10-CM

## 2022-04-03 NOTE — Therapy (Signed)
OUTPATIENT PHYSICAL THERAPY TREATMENT   Patient Name: Angelica [name] MRN: [id number] DOB:1950/07/25, 71 y.o., female Today's Date: 04/03/2022   PCP: Lavada Mesi REFERRING PROVIDER: Donella Stade, PA-C   PT End of Session - 04/03/22 1148     Visit Number 2    Number of Visits 7    Date for PT Re-Evaluation 05/20/22    Authorization Type [employer]    Progress Note Due on Visit 10    PT Start Time 1148    PT Stop Time 1230    PT Time Calculation (min) 42 min    Activity Tolerance Patient tolerated treatment well    Behavior During Therapy WFL for tasks assessed/performed              Past Medical History:  Diagnosis Date   Allergy    Anxiety    Asthma    Breast cancer ([place]) 2009   Depression    Dyspnea    with heavy pollen    Hypertension    Hypothyroidism    Postmenopausal    Thyroid disease    Past Surgical History:  Procedure Laterality Date   ADENOIDECTOMY     APPENDECTOMY     BREAST RECONSTRUCTION  06/2008   CHOLECYSTECTOMY N/A 11/12/2017   Procedure: LAPAROSCOPIC CHOLECYSTECTOMY WITH INTRAOPERATIVE CHOLANGIOGRAM ERAS PATHWAY;  Surgeon: Erroll Luna, MD;  Location: [facility];  Service: General;  Laterality: N/A;   MASTECTOMY  2009   BrCa   TONSILLECTOMY     Patient Active Problem List   Diagnosis Date Noted   Fall 03/08/2022   Primary osteoarthritis of left knee 12/19/2021   Hirsutism 10/19/2021   Night sweats 10/19/2021   Personal history of breast cancer 10/15/2021   Diverticular disease of colon 10/15/2021   Osteopenia 05/10/2021   Injection site reaction 04/04/2020   Rotator cuff syndrome, left 05/26/2019   Neck fullness 08/31/2018   Colon polyps 03/31/2018   Left-sided nosebleed 03/02/2018   TMJ pain dysfunction syndrome 03/02/2018   Calculus of gallbladder 10/21/2017   Elevated lipase 10/21/2017   Diarrhea 10/21/2017   Upper abdominal pain 10/21/2017   Cellulitis and abscess of buttock 06/04/2017   Essential hypertension  06/11/2016   Epistaxis 11/14/2015   Trapezius muscle spasm 09/28/2015   Elevated blood pressure 04/26/2015   Dyslipidemia (high LDL; low HDL) 03/09/2015   IBS (irritable bowel syndrome) 08/22/2014   Insomnia 03/15/2014   Chondromalacia of right patellofemoral joint 02/10/2014   Anxiety and depression 08/03/2013   Episodic mood disorder ([place]) 10/17/2012   Breast cancer ([place]) 01/31/2011   GAD (generalized anxiety disorder) 01/31/2011   Asthma 01/31/2011   Seasonal allergies 01/31/2011   Hypothyroid 01/31/2011    ONSET DATE: 3 weeks ago  REFERRING DIAG:  F07.81 (ICD-10-CM) - Post concussion syndrome  R26.81 (ICD-10-CM) - Gait instability  R26.89 (ICD-10-CM) - Balance problems    THERAPY DIAG:  Unsteadiness on feet  Other abnormalities of gait and mobility  History of falling  Chronic pain of left knee  Muscle weakness (generalized)  Rationale for Evaluation and Treatment Rehabilitation  SUBJECTIVE:  SUBJECTIVE STATEMENT: Pt reports nothing new or different.  Pt accompanied by: self  PERTINENT HISTORY: PT for knee pain  PAIN:  Are you having pain? No  PRECAUTIONS: Fall  WEIGHT BEARING RESTRICTIONS No  FALLS: Has patient fallen in last 6 months? Yes. Number of falls 1  LIVING ENVIRONMENT: Lives with: lives with their family Lives in: House/apartment Stairs: Yes: Internal: 12 steps; on left going up Has following equipment at home: None  PLOF: Independent  PATIENT GOALS Improve balance  OBJECTIVE:   FROM EVAL  LOWER EXTREMITY MMT:    MMT Right Eval Left Eval  Hip flexion 5 5  Hip extension    Hip abduction 4 4  Hip adduction    Hip internal rotation    Hip external rotation    Knee flexion 5 5  Knee extension 5 5  Ankle dorsiflexion    Ankle  plantarflexion    Ankle inversion    Ankle eversion    (Blank rows = not tested)  RAMP:  Not tested  CURB:  Not tested  STAIRS:  Level of Assistance: Modified independence  Stair Negotiation Technique: Alternating Pattern  with Bilateral Rails  Number of Stairs: 10   Height of Stairs: 4-6"  Comments: Reciprocal pattern  GAIT: Gait pattern: WFL Distance walked: 100' Assistive device utilized: None Level of assistance: Complete Independence Comments: none  FUNCTIONAL TESTs:  From eval 5 times sit to stand: 11 sec Timed up and go (TUG): 14 sec Berg Balance Scale: 48 Functional gait assessment: 19 MCTSIB: Condition 1: Avg of 3 trials: 30 sec, Condition 2: Avg of 3 trials: 30 sec, Condition 3: Avg of 3 trials: 30 sec, Condition 4: Avg of 3 trials: 20 sec, and Total Score: 110/120   PATIENT SURVEYS:  ABC scale 83.12%  from eval  TODAY'S TREATMENT:  04/03/22: Treadmill warm up 1.0 mph x 5 min  Standing Heel raise no support x20 Knee flexion no UE support x20 Slow marching x20 Four square stepping x5 CW & CCW Backwards stepping over poles x 10 Tandem walking x 15' with 5 sec holds Tandem stance L LE back 2x30 sec On 6" step runner's step up 2x10  On airex Feet together eyes closed 2x30 sec Feet together eyes open, head turns and head nods 2x10     PATIENT EDUCATION: Education details: Exam findings, POC Person educated: Patient Education method: Explanation Education comprehension: verbalized understanding and needs further education    HOME EXERCISE PROGRAM: Access Code: [email or web address] URL: [email or web address] Date: 04/03/2022 Prepared by: Estill Bamberg April Thurnell Garbe  Exercises - Tandem Stance  - 1 x daily - 7 x weekly - 2 sets - 30 sec hold - Romberg Stance Eyes Closed on Foam Pad  - 1 x daily - 7 x weekly - 2 sets - 30 sec hold - Narrow Stance with Eyes Closed and Head Nods on Foam Pad  - 1 x daily - 7 x weekly - 2 sets - 30 sec hold -  Narrow Stance with Eyes Closed and Head Rotation on Foam Pad  - 1 x daily - 7 x weekly - 2 sets - 30 sec hold - Runner's Step Up/Down  - 1 x daily - 7 x weekly - 2 sets - 10 reps    GOALS: Goals reviewed with patient? Yes   LONG TERM GOALS: Target date: 05/06/2022  Pt will be ind with balance HEP Baseline:  Goal status: INITIAL  2.  Pt will demo at least 22/30 on FGA for  decreased fall risk Baseline:  Goal status: INITIAL  3.  Pt will have increased Berg Balance Score to at least 54/56 for low fall risk Baseline:  Goal status: INITIAL  4.  Pt will have improved ABC to at least 90% Baseline:  Goal status: INITIAL    ASSESSMENT:  CLINICAL IMPRESSION: 10/11: Treatment focused on initiating balance exercises for home. Difficulty stabilizing on L LE > R LE this session.    OBJECTIVE IMPAIRMENTS decreased balance, decreased coordination, and decreased mobility.   ACTIVITY LIMITATIONS carrying, lifting, stairs, and locomotion level  PARTICIPATION LIMITATIONS: cleaning, community activity, and yard work  PERSONAL FACTORS Age, Past/current experiences, and Time since onset of injury/illness/exacerbation are also affecting patient's functional outcome.   REHAB POTENTIAL: Good  CLINICAL DECISION MAKING: Stable/uncomplicated  EVALUATION COMPLEXITY: Low  PLAN: PT FREQUENCY: 1x/week  PT DURATION: 6 weeks  PLANNED INTERVENTIONS: Therapeutic exercises, Therapeutic activity, Neuromuscular re-education, Balance training, Gait training, Patient/Family education, Self Care, Joint mobilization, Stair training, Aquatic Therapy, Electrical stimulation, Cryotherapy, Moist heat, Taping, Ionotophoresis 4mg /ml Dexamethasone, Manual therapy, and Re-evaluation  PLAN FOR NEXT SESSION: Continue progressing balance. Work primarily on dynamic balance, single leg stance and multi directional stepping   Rowena Moilanen April Ma L Barbara Ahart, PT, DPT 04/03/2022, 11:48 AM

## 2022-04-04 ENCOUNTER — Encounter: Payer: Self-pay | Admitting: Physician Assistant

## 2022-04-04 ENCOUNTER — Other Ambulatory Visit: Payer: Self-pay | Admitting: Physician Assistant

## 2022-04-04 DIAGNOSIS — E785 Hyperlipidemia, unspecified: Secondary | ICD-10-CM

## 2022-04-10 ENCOUNTER — Ambulatory Visit: Payer: Medicare Other | Admitting: Physical Therapy

## 2022-04-10 ENCOUNTER — Encounter: Payer: Self-pay | Admitting: Physical Therapy

## 2022-04-10 DIAGNOSIS — M6281 Muscle weakness (generalized): Secondary | ICD-10-CM | POA: Diagnosis not present

## 2022-04-10 DIAGNOSIS — Z9181 History of falling: Secondary | ICD-10-CM

## 2022-04-10 DIAGNOSIS — R2681 Unsteadiness on feet: Secondary | ICD-10-CM

## 2022-04-10 DIAGNOSIS — G8929 Other chronic pain: Secondary | ICD-10-CM | POA: Diagnosis not present

## 2022-04-10 DIAGNOSIS — R2689 Other abnormalities of gait and mobility: Secondary | ICD-10-CM

## 2022-04-10 DIAGNOSIS — M25562 Pain in left knee: Secondary | ICD-10-CM | POA: Diagnosis not present

## 2022-04-10 NOTE — Therapy (Signed)
OUTPATIENT PHYSICAL THERAPY TREATMENT   Patient Name: Angelica [name] MRN: [id number] DOB:[birth year], 71 y.o., female Today's Date: 04/10/2022   PCP: Lavada Mesi REFERRING PROVIDER: Donella Stade, PA-C   PT End of Session - 04/10/22 1145     Visit Number 3    Number of Visits 7    Date for PT Re-Evaluation 05/20/22    Authorization Type [employer]    Authorization - Visit Number 2    Progress Note Due on Visit 10    PT Start Time 9935    PT Stop Time 1230    PT Time Calculation (min) 45 min    Activity Tolerance Patient tolerated treatment well    Behavior During Therapy WFL for tasks assessed/performed              Past Medical History:  Diagnosis Date   Allergy    Anxiety    Asthma    Breast cancer ([place]) 2009   Depression    Dyspnea    with heavy pollen    Hypertension    Hypothyroidism    Postmenopausal    Thyroid disease    Past Surgical History:  Procedure Laterality Date   ADENOIDECTOMY     APPENDECTOMY     BREAST RECONSTRUCTION  06/2008   CHOLECYSTECTOMY N/A 11/12/2017   Procedure: LAPAROSCOPIC CHOLECYSTECTOMY WITH INTRAOPERATIVE CHOLANGIOGRAM ERAS PATHWAY;  Surgeon: Erroll Luna, MD;  Location: [facility];  Service: General;  Laterality: N/A;   MASTECTOMY  2009   BrCa   TONSILLECTOMY     Patient Active Problem List   Diagnosis Date Noted   Fall 03/08/2022   Primary osteoarthritis of left knee 12/19/2021   Hirsutism 10/19/2021   Night sweats 10/19/2021   Personal history of breast cancer 10/15/2021   Diverticular disease of colon 10/15/2021   Osteopenia 05/10/2021   Injection site reaction 04/04/2020   Rotator cuff syndrome, left 05/26/2019   Neck fullness 08/31/2018   Colon polyps 03/31/2018   Left-sided nosebleed 03/02/2018   TMJ pain dysfunction syndrome 03/02/2018   Calculus of gallbladder 10/21/2017   Elevated lipase 10/21/2017   Diarrhea 10/21/2017   Upper abdominal pain 10/21/2017   Cellulitis and abscess of buttock  06/04/2017   Essential hypertension 06/11/2016   Epistaxis 11/14/2015   Trapezius muscle spasm 09/28/2015   Elevated blood pressure 04/26/2015   Dyslipidemia (high LDL; low HDL) 03/09/2015   IBS (irritable bowel syndrome) 08/22/2014   Insomnia 03/15/2014   Chondromalacia of right patellofemoral joint 02/10/2014   Anxiety and depression 08/03/2013   Episodic mood disorder ([place]) 10/17/2012   Breast cancer ([place]) 01/31/2011   GAD (generalized anxiety disorder) 01/31/2011   Asthma 01/31/2011   Seasonal allergies 01/31/2011   Hypothyroid 01/31/2011    ONSET DATE: 3 weeks ago  REFERRING DIAG:  F07.81 (ICD-10-CM) - Post concussion syndrome  R26.81 (ICD-10-CM) - Gait instability  R26.89 (ICD-10-CM) - Balance problems    THERAPY DIAG:  Unsteadiness on feet  Other abnormalities of gait and mobility  History of falling  Rationale for Evaluation and Treatment Rehabilitation  SUBJECTIVE:  SUBJECTIVE STATEMENT: Pt states she's been doing her exercises. The foam exercises have been very challenging.  Pt accompanied by: self  PERTINENT HISTORY: PT for knee pain  PAIN:  Are you having pain? No  PRECAUTIONS: Fall  WEIGHT BEARING RESTRICTIONS No  FALLS: Has patient fallen in last 6 months? Yes. Number of falls 1  LIVING ENVIRONMENT: Lives with: lives with their family Lives in: House/apartment Stairs: Yes: Internal: 12 steps; on left going up Has following equipment at home: None  PLOF: Independent  PATIENT GOALS Improve balance  OBJECTIVE:   FROM EVAL  LOWER EXTREMITY MMT:    MMT Right Eval Left Eval  Hip flexion 5 5  Hip extension    Hip abduction 4 4  Hip adduction    Hip internal rotation    Hip external rotation    Knee flexion 5 5  Knee extension 5 5  Ankle  dorsiflexion    Ankle plantarflexion    Ankle inversion    Ankle eversion    (Blank rows = not tested)   STAIRS:  Level of Assistance: Modified independence  Stair Negotiation Technique: Alternating Pattern  with Bilateral Rails  Number of Stairs: 10   Height of Stairs: 4-6"  Comments: Reciprocal pattern  GAIT: Gait pattern: WFL Distance walked: 100' Assistive device utilized: None Level of assistance: Complete Independence Comments: none  FUNCTIONAL TESTs:  From eval 5 times sit to stand: 11 sec Timed up and go (TUG): 14 sec Berg Balance Scale: 48 Functional gait assessment: 19 MCTSIB: Condition 1: Avg of 3 trials: 30 sec, Condition 2: Avg of 3 trials: 30 sec, Condition 3: Avg of 3 trials: 30 sec, Condition 4: Avg of 3 trials: 20 sec, and Total Score: 110/120   PATIENT SURVEYS:  ABC scale 83.12%  from eval  TODAY'S TREATMENT:  04/10/22: Recumbent bike L1 x 6 min  Standing Tandem stance 2x30 sec Tandem walking 4x10 steps Feet together EC head nods 2x30 sec Feet together EC head turns 2x30 sec Backwards walking with intermittent stops x 2 laps around gym [place] 2x15'  On airex Feet together EC static x30 sec Feet apart EC static x30 sec Feet together EO head nods 2x30 sec Feet together EO head turns 2x30 sec Slow marching x20   04/03/22: Treadmill warm up 1.0 mph x 5 min  Standing Heel raise no support x20 Knee flexion no UE support x20 Slow marching x20 Four square stepping x5 CW & CCW Backwards stepping over poles x 10 Tandem walking x 15' with 5 sec holds Tandem stance L LE back 2x30 sec On 6" step runner's step up 2x10  On airex Feet together eyes closed 2x30 sec Feet together eyes open, head turns and head nods 2x10     PATIENT EDUCATION: Education details: Exam findings, POC Person educated: Patient Education method: Explanation Education comprehension: verbalized understanding and needs further education    HOME EXERCISE  PROGRAM: Access Code: [email or web address] URL: [email or web address] Date: 04/03/2022 Prepared by: Estill Bamberg April Thurnell Garbe  Exercises - Tandem Stance  - 1 x daily - 7 x weekly - 2 sets - 30 sec hold - Romberg Stance Eyes Closed on Foam Pad  - 1 x daily - 7 x weekly - 2 sets - 30 sec hold - Narrow Stance with Eyes Closed and Head Nods on Foam Pad  - 1 x daily - 7 x weekly - 2 sets - 30 sec hold - Narrow Stance with Eyes Closed and Head Rotation on Foam Pad  -  1 x daily - 7 x weekly - 2 sets - 30 sec hold - Runner's Step Up/Down  - 1 x daily - 7 x weekly - 2 sets - 10 reps    GOALS: Goals reviewed with patient? Yes   LONG TERM GOALS: Target date: 05/06/2022  Pt will be ind with balance HEP Baseline:  Goal status: INITIAL  2.  Pt will demo at least 22/30 on FGA for decreased fall risk Baseline:  Goal status: INITIAL  3.  Pt will have increased Berg Balance Score to at least 54/56 for low fall risk Baseline:  Goal status: INITIAL  4.  Pt will have improved ABC to at least 90% Baseline:  Goal status: INITIAL    ASSESSMENT:  CLINICAL IMPRESSION: Modified pt's HEP. Pt with too much difficulty performing eyes closed exercises on foam with head movements -- pt to perform on solid surface. Worked on improving dynamic gait this session. Difficulty with maintaining balance with cueing for intermittent stops.    OBJECTIVE IMPAIRMENTS decreased balance, decreased coordination, and decreased mobility.   ACTIVITY LIMITATIONS carrying, lifting, stairs, and locomotion level  PARTICIPATION LIMITATIONS: cleaning, community activity, and yard work  PERSONAL FACTORS Age, Past/current experiences, and Time since onset of injury/illness/exacerbation are also affecting patient's functional outcome.   REHAB POTENTIAL: Good  CLINICAL DECISION MAKING: Stable/uncomplicated  EVALUATION COMPLEXITY: Low  PLAN: PT FREQUENCY: 1x/week  PT DURATION: 6 weeks  PLANNED INTERVENTIONS:  Therapeutic exercises, Therapeutic activity, Neuromuscular re-education, Balance training, Gait training, Patient/Family education, Self Care, Joint mobilization, Stair training, Aquatic Therapy, Electrical stimulation, Cryotherapy, Moist heat, Taping, Ionotophoresis 4mg /ml Dexamethasone, Manual therapy, and Re-evaluation  PLAN FOR NEXT SESSION: Continue progressing balance. Work primarily on dynamic balance, single leg stance and multi directional stepping   Maame Dack April Ma L Clessie Karras, PT, DPT 04/10/2022, 11:45 AM

## 2022-04-12 ENCOUNTER — Ambulatory Visit (INDEPENDENT_AMBULATORY_CARE_PROVIDER_SITE_OTHER): Payer: Medicare Other | Admitting: Physician Assistant

## 2022-04-12 VITALS — BP 130/59 | HR 75 | Ht 63.0 in | Wt 167.0 lb

## 2022-04-12 DIAGNOSIS — E039 Hypothyroidism, unspecified: Secondary | ICD-10-CM | POA: Diagnosis not present

## 2022-04-12 DIAGNOSIS — F419 Anxiety disorder, unspecified: Secondary | ICD-10-CM

## 2022-04-12 DIAGNOSIS — I10 Essential (primary) hypertension: Secondary | ICD-10-CM

## 2022-04-12 DIAGNOSIS — Z23 Encounter for immunization: Secondary | ICD-10-CM

## 2022-04-12 DIAGNOSIS — E785 Hyperlipidemia, unspecified: Secondary | ICD-10-CM

## 2022-04-12 DIAGNOSIS — F32A Depression, unspecified: Secondary | ICD-10-CM | POA: Diagnosis not present

## 2022-04-12 MED ORDER — LEVOTHYROXINE SODIUM 75 MCG PO TABS
ORAL_TABLET | ORAL | 3 refills | Status: DC
Start: 2022-04-12 — End: 2022-04-15

## 2022-04-12 MED ORDER — ATORVASTATIN CALCIUM 40 MG PO TABS: 40.0000 mg | ORAL_TABLET | Freq: Every day | ORAL | 3 refills | Status: DC

## 2022-04-12 MED ORDER — CLONAZEPAM 0.5 MG PO TABS: ORAL_TABLET | ORAL | 5 refills | Status: DC

## 2022-04-12 NOTE — Progress Notes (Signed)
Established Patient Office Visit  Subjective   Patient ID: Angelica [name], female    DOB: March 29, 1951  Age: 71 y.o. MRN: [id number]  Chief Complaint  Patient presents with   Follow-up    HPI Pt is a 71 yo female who presents to the clinic for medication follow up.   Pt is doing better. She only has occasional headaches from post concussion. She is sleeping well. Her anxiety is controlled. She is taking her thyroid medications without any complications. No SI/HC.   [name]. Active Ambulatory Problems    Diagnosis Date Noted   Breast cancer ([place]) 01/31/2011   GAD (generalized anxiety disorder) 01/31/2011   Asthma 01/31/2011   Seasonal allergies 01/31/2011   Hypothyroid 01/31/2011   Episodic mood disorder ([place]) 10/17/2012   Anxiety and depression 08/03/2013   Chondromalacia of right patellofemoral joint 02/10/2014   Insomnia 03/15/2014   IBS (irritable bowel syndrome) 08/22/2014   Dyslipidemia (high LDL; low HDL) 03/09/2015   Elevated blood pressure 04/26/2015   Trapezius muscle spasm 09/28/2015   Epistaxis 11/14/2015   Essential hypertension 06/11/2016   Cellulitis and abscess of buttock 06/04/2017   Calculus of gallbladder 10/21/2017   Elevated lipase 10/21/2017   Diarrhea 10/21/2017   Upper abdominal pain 10/21/2017   Left-sided nosebleed 03/02/2018   TMJ pain dysfunction syndrome 03/02/2018   Colon polyps 03/31/2018   Neck fullness 08/31/2018   Rotator cuff syndrome, left 05/26/2019   Injection site reaction 04/04/2020   Osteopenia 05/10/2021   Personal history of breast cancer 10/15/2021   Diverticular disease of colon 10/15/2021   Hirsutism 10/19/2021   Night sweats 10/19/2021   Primary osteoarthritis of left knee 12/19/2021   Fall 03/08/2022   Resolved Ambulatory Problems    Diagnosis Date Noted   OAB (overactive bladder) 03/08/2015   Vitamin D deficiency 03/09/2015   Impingement syndrome of left shoulder 08/28/2015   Acute upper respiratory infection 12/15/2015    Past Medical History:  Diagnosis Date   Allergy    Anxiety    Asthma    Depression    Dyspnea    Hypertension    Hypothyroidism    Postmenopausal    Thyroid disease        ROS See HPI   Objective:     BP (!) 130/59   Pulse 75   Ht '5\' 3"'$  (1.6 m)   Wt 167 lb (75.8 kg)   LMP 06/24/2000   SpO2 98%   BMI 29.58 kg/m  BP Readings from Last 3 Encounters:  04/12/22 (!) 130/59  03/15/22 130/61  12/08/21 (!) 111/54   Wt Readings from Last 3 Encounters:  04/12/22 167 lb (75.8 kg)  03/15/22 168 lb (76.2 kg)  03/08/22 166 lb (75.3 kg)    .[name]    04/15/2022    9:17 AM 04/12/2022    9:53 AM 10/16/2021    2:44 PM 04/13/2021   11:04 AM 10/16/2020   11:02 AM  Depression screen PHQ 2/9  Decreased Interest '1 1 1 '$ 0 0  Down, Depressed, Hopeless '1 1 1 '$ 0 0  PHQ - 2 Score '2 2 2 '$ 0 0  Altered sleeping '1 1 2  2  '$ Tired, decreased energy '1 1 2  2  '$ Change in appetite 3 3 0  2  Feeling bad or failure about yourself  '2 2 3  2  '$ Trouble concentrating '1 1 2  2  '$ Moving slowly or fidgety/restless '1 1 2  '$ 0  Suicidal thoughts 0 0 0  0  PHQ-9 Score '11 11 13  10  '$ Difficult doing work/chores Somewhat difficult Somewhat difficult Somewhat difficult  Somewhat difficult   ..    04/12/2022    9:53 AM 10/16/2021    2:45 PM 10/16/2020   11:02 AM 04/10/2020   10:38 AM  GAD 7 : Generalized Anxiety Score  Nervous, Anxious, on Edge 1 0 2 2  Control/stop worrying '1 2 2 2  '$ Worry too much - different things '2 2 2 2  '$ Trouble relaxing '1 2 2 2  '$ Restless '1 1 2 2  '$ Easily annoyed or irritable '2 2 3 2  '$ Afraid - awful might happen '1 1 3 '$ 0  Total GAD 7 Score '9 10 16 12  '$ Anxiety Difficulty Somewhat difficult Somewhat difficult Very difficult Somewhat difficult      Physical Exam Constitutional:      Appearance: Normal appearance.  HENT:     Head: Normocephalic.  Cardiovascular:     Rate and Rhythm: Normal rate and regular rhythm.  Pulmonary:     Effort: Pulmonary effort is normal.     Breath  sounds: Normal breath sounds.  Neurological:     General: No focal deficit present.     Mental Status: She is alert and oriented to person, place, and time.  Psychiatric:        Mood and Affect: Mood normal.         Assessment & Plan:  Marland KitchenMarland KitchenLaprecious was seen today for follow-up.  Diagnoses and all orders for this visit:  Anxiety and depression -     clonazePAM (KLONOPIN) 0.5 MG tablet; Take one tablet as needed for anxiety no more than once a day.  Flu vaccine need -     Flu Vaccine QUAD High Dose(Fluad)  Dyslipidemia (high LDL; low HDL) -     Lipid Panel w/reflex Direct LDL -     atorvastatin (LIPITOR) 40 MG tablet; Take 1 tablet (40 mg total) by mouth daily.  Acquired hypothyroidism -     TSH  Essential hypertension  Other orders -     Discontinue: levothyroxine (SYNTHROID) 75 MCG tablet; TAKE 1 TABLET BY MOUTH DAILY BEFORE BREAFAST   GAD/PHQ  Labs ordered Klonapin refilled Flu shot given today Vitals look great     Iran Planas, PA-C

## 2022-04-13 LAB — LIPID PANEL W/REFLEX DIRECT LDL
Cholesterol: 171 mg/dL (ref <200)
HDL: 44 mg/dL — ABNORMAL LOW (ref >=50)
LDL Cholesterol (Calc): 102 mg/dL (calc) — ABNORMAL HIGH
Non-HDL Cholesterol (Calc): 127 mg/dL (calc) (ref <130)
Total CHOL/HDL Ratio: 3.9 (calc) (ref <5.0)
Triglycerides: 151 mg/dL — ABNORMAL HIGH (ref <150)

## 2022-04-13 LAB — TSH: TSH: 8.34 mIU/L — ABNORMAL HIGH (ref 0.40–4.50)

## 2022-04-15 ENCOUNTER — Other Ambulatory Visit: Payer: Self-pay | Admitting: Neurology

## 2022-04-15 ENCOUNTER — Other Ambulatory Visit: Payer: Self-pay | Admitting: Physician Assistant

## 2022-04-15 ENCOUNTER — Ambulatory Visit (INDEPENDENT_AMBULATORY_CARE_PROVIDER_SITE_OTHER): Payer: Medicare Other | Admitting: Physician Assistant

## 2022-04-15 ENCOUNTER — Encounter: Payer: Self-pay | Admitting: Physician Assistant

## 2022-04-15 DIAGNOSIS — Z0000 Encounter for general adult medical examination without abnormal findings: Secondary | ICD-10-CM | POA: Diagnosis not present

## 2022-04-15 DIAGNOSIS — E039 Hypothyroidism, unspecified: Secondary | ICD-10-CM

## 2022-04-15 MED ORDER — LEVOTHYROXINE SODIUM 88 MCG PO TABS: 88.0000 ug | ORAL_TABLET | Freq: Every day | ORAL | 1 refills | Status: DC

## 2022-04-15 NOTE — Patient Instructions (Addendum)
[place] Maintenance Summary and Written Plan of Care  Angelica [name] ,  Thank you for allowing me to perform your Medicare Annual Wellness Visit and for your ongoing commitment to your health.   Health Maintenance & Immunization History Health Maintenance  Topic Date Due   COVID-19 Vaccine (5 - Moderna risk series) 04/28/2022 (Originally 07/11/2021)   Zoster Vaccines- Shingrix (1 of 2) 07/13/2022 (Originally 09/25/1969)   TETANUS/TDAP  04/16/2023 (Originally 01/30/2021)   COLONOSCOPY (Pts 45-50yr Insurance coverage will need to be confirmed)  03/31/2023   MAMMOGRAM  05/09/2023   Pneumonia Vaccine 71 Years old  Completed   INFLUENZA VACCINE  Completed   DEXA SCAN  Completed   Hepatitis C Screening  Completed   HPV VACCINES  Aged Out   Immunization History  Administered Date(s) Administered   Fluad Quad(high Dose 65+) 04/17/2021, 04/12/2022   Influenza Split 06/24/2012   Influenza Whole 03/29/2009   Influenza, High Dose Seasonal PF 02/26/2017, 04/02/2018   Influenza,inj,Quad PF,6+ Mos 03/14/2014, 03/08/2015, 05/14/2016, 03/02/2019   Influenza,trivalent, recombinat, inj, PF 05/29/2012   Influenza-Unspecified 05/29/2012, 02/26/2017, 04/02/2018, 04/02/2020   Moderna Sars-Covid-2 Vaccination 08/07/2019, 09/04/2019, 05/27/2020   Pfizer Covid-19 Vaccine Bivalent Booster 71yr& up 05/16/2021   Pneumococcal Conjugate-13 02/26/2017   Pneumococcal Polysaccharide-23 05/29/2012, 06/24/2012, 08/31/2018   Pneumococcal-Unspecified 02/26/2017, 04/02/2020   Tdap 01/31/2011   Zoster, Live 01/31/2011    These are the patient goals that we discussed:  Goals Addressed               This Visit's Progress     Patient Stated (pt-stated)        04/15/2022 AWV Goal: Exercise for General Health  Patient will verbalize understanding of the benefits of increased physical activity: Exercising regularly is important. It will improve your overall fitness, flexibility, and  endurance. Regular exercise also will improve your overall health. It can help you control your weight, reduce stress, and improve your bone density. Over the next year, patient will increase physical activity as tolerated with a goal of at least 150 minutes of moderate physical activity per week.  You can tell that you are exercising at a moderate intensity if your heart starts beating faster and you start breathing faster but can still hold a conversation. Moderate-intensity exercise ideas include: Walking 1 mile (1.6 km) in about 15 minutes Biking Hiking Golfing Dancing Water aerobics Patient will verbalize understanding of everyday activities that increase physical activity by providing examples like the following: Yard work, such as: PuSales promotion account executiveardening Washing windows or floors Patient will be able to explain general safety guidelines for exercising:  Before you start a new exercise program, talk with your health care provider. Do not exercise so much that you hurt yourself, feel dizzy, or get very short of breath. Wear comfortable clothes and wear shoes with good support. Drink plenty of water while you exercise to prevent dehydration or heat stroke. Work out until your breathing and your heartbeat get faster.          This is a list of Health Maintenance Items that are overdue or due now: Td vaccine Shingrix vaccine Mammogram scheduled for 05/09/22.  Orders/Referrals Placed Today: No orders of the defined types were placed in this encounter.  (Contact our referral department at [phone] you have not spoken with someone about your referral appointment within the next 5 days)    Follow-up Plan  Follow-up with Donella Stade, PA-C as planned Schedule your shignrix and tetatus at your pharmacy.  Medicare wellness visit in one year. Patient will access AVS on my chart.     Health Maintenance, Female Adopting a healthy lifestyle and getting preventive care are important in promoting health and wellness. Ask your health care provider about: The right schedule for you to have regular tests and exams. Things you can do on your own to prevent diseases and keep yourself healthy. What should I know about diet, weight, and exercise? Eat a healthy diet  Eat a diet that includes plenty of vegetables, fruits, low-fat dairy products, and lean protein. Do not eat a lot of foods that are high in solid fats, added sugars, or sodium. Maintain a healthy weight Body mass index (BMI) is used to identify weight problems. It estimates body fat based on height and weight. Your health care provider can help determine your BMI and help you achieve or maintain a healthy weight. Get regular exercise Get regular exercise. This is one of the most important things you can do for your health. Most adults should: Exercise for at least 150 minutes each week. The exercise should increase your heart rate and make you sweat (moderate-intensity exercise). Do strengthening exercises at least twice a week. This is in addition to the moderate-intensity exercise. Spend less time sitting. Even light physical activity can be beneficial. Watch cholesterol and blood lipids Have your blood tested for lipids and cholesterol at 71 years of age, then have this test every 5 years. Have your cholesterol levels checked more often if: Your lipid or cholesterol levels are high. You are older than 71 years of age. You are at high risk for heart disease. What should I know about cancer screening? Depending on your health history and family history, you may need to have cancer screening at various ages. This may include screening for: Breast cancer. Cervical cancer. Colorectal cancer. Skin cancer. Lung cancer. What should I know about heart disease, diabetes, and high blood pressure? Blood pressure and heart  disease High blood pressure causes heart disease and increases the risk of stroke. This is more likely to develop in people who have high blood pressure readings or are overweight. Have your blood pressure checked: Every 3-5 years if you are 71-74 years of age. Every year if you are 71 years old or older. Diabetes Have regular diabetes screenings. This checks your fasting blood sugar level. Have the screening done: Once every three years after age 82 if you are at a normal weight and have a low risk for diabetes. More often and at a younger age if you are overweight or have a high risk for diabetes. What should I know about preventing infection? Hepatitis B If you have a higher risk for hepatitis B, you should be screened for this virus. Talk with your health care provider to find out if you are at risk for hepatitis B infection. Hepatitis C Testing is recommended for: Everyone born from [birth year] through 1965. Anyone with known risk factors for hepatitis C. Sexually transmitted infections (STIs) Get screened for STIs, including gonorrhea and chlamydia, if: You are sexually active and are younger than 71 years of age. You are older than 71 years of age and your health care provider tells you that you are at risk for this type of infection. Your sexual activity has changed since you were last screened, and you are at increased risk for chlamydia or gonorrhea. Ask your health  care provider if you are at risk. Ask your health care provider about whether you are at high risk for HIV. Your health care provider may recommend a prescription medicine to help prevent HIV infection. If you choose to take medicine to prevent HIV, you should first get tested for HIV. You should then be tested every 3 months for as long as you are taking the medicine. Pregnancy If you are about to stop having your period (premenopausal) and you may become pregnant, seek counseling before you get pregnant. Take 400 to 800  micrograms (mcg) of folic acid every day if you become pregnant. Ask for birth control (contraception) if you want to prevent pregnancy. Osteoporosis and menopause Osteoporosis is a disease in which the bones lose minerals and strength with aging. This can result in bone fractures. If you are 73 years old or older, or if you are at risk for osteoporosis and fractures, ask your health care provider if you should: Be screened for bone loss. Take a calcium or vitamin D supplement to lower your risk of fractures. Be given hormone replacement therapy (HRT) to treat symptoms of menopause. Follow these instructions at home: Alcohol use Do not drink alcohol if: Your health care provider tells you not to drink. You are pregnant, may be pregnant, or are planning to become pregnant. If you drink alcohol: Limit how much you have to: 0-1 drink a day. Know how much alcohol is in your drink. In the [place] one drink equals one 12 oz bottle of beer (355 mL), one 5 oz glass of wine (148 mL), or one 1 oz glass of hard liquor (44 mL). Lifestyle Do not use any products that contain nicotine or tobacco. These products include cigarettes, chewing tobacco, and vaping devices, such as e-cigarettes. If you need help quitting, ask your health care provider. Do not use street drugs. Do not share needles. Ask your health care provider for help if you need support or information about quitting drugs. General instructions Schedule regular health, dental, and eye exams. Stay current with your vaccines. Tell your health care provider if: You often feel depressed. You have ever been abused or do not feel safe at home. Summary Adopting a healthy lifestyle and getting preventive care are important in promoting health and wellness. Follow your health care provider's instructions about healthy diet, exercising, and getting tested or screened for diseases. Follow your health care provider's instructions on monitoring your  cholesterol and blood pressure. This information is not intended to replace advice given to you by your health care provider. Make sure you discuss any questions you have with your health care provider. Document Revised: 10/30/2020 Document Reviewed: 10/30/2020 Elsevier Patient Education  [place].

## 2022-04-15 NOTE — Progress Notes (Signed)
Angelica [name],   TSH is way up. Meaning we need to increase thyroid dose to 67mg and recheck in 6 weeks.   LDL up just a bit but TG down and HDL up which are both good things.

## 2022-04-15 NOTE — Progress Notes (Signed)
MEDICARE ANNUAL WELLNESS VISIT  04/15/2022  Telephone Visit Disclaimer This Medicare AWV was conducted by telephone due to national recommendations for restrictions regarding the COVID-19 Pandemic (e.g. social distancing).  I verified, using two identifiers, that I am speaking with Angelica [name] or their authorized healthcare agent. I discussed the limitations, risks, security, and privacy concerns of performing an evaluation and management service by telephone and the potential availability of an in-person appointment in the future. The patient expressed understanding and agreed to proceed.  Location of Patient: Home Location of Provider (nurse):  In the office.  Subjective:    Angelica [name] is a 71 y.o. female patient of Angelica [name], Angelica Car, PA-C who had a Medicare Annual Wellness Visit today via telephone. Angelica [name] is Retired and lives with their family. she has 1 child. she reports that she is socially active and does interact with friends/family regularly. she is minimally physically active and enjoys doing crafts.  Patient Care Team: Angelica [name] as PCP - General (Family Medicine)     04/15/2022    9:16 AM 03/25/2022   11:53 AM 12/08/2021   12:46 PM 04/13/2021   11:03 AM 12/22/2019   10:05 AM 12/06/2019   12:55 AM 12/21/2018    1:09 PM  Advanced Directives  Does Patient Have a Medical Advance Directive? Yes No No Yes Yes No No  Type of Advance Directive Living will   Living will [place];Living will    Does patient want to make changes to medical advance directive? No - Patient declined   No - Patient declined No - Patient declined    Copy of [place] in Chart?     No - copy requested    Would patient like information on creating a medical advance directive?  Yes (MAU/Ambulatory/Procedural Areas - Information given)     Yes (MAU/Ambulatory/Procedural Areas - Information given)    Hospital Utilization Over the Past 12 Months: # of  hospitalizations or ER visits: 2 # of surgeries: 0  Review of Systems    Patient reports that her overall health is unchanged compared to last year.  History obtained from chart review and the patient  Patient Reported Readings (BP, Pulse, CBG, Weight, etc) none  Pain Assessment Pain : No/denies pain     Current Medications & Allergies (verified) Allergies as of 04/15/2022       Reactions   Penicillins Hives, Swelling, Rash, Other (See Comments)   PATIENT HAS HAD A PCN REACTION WITH IMMEDIATE RASH, FACIAL/TONGUE/THROAT SWELLING, SOB, OR LIGHTHEADEDNESS WITH HYPOTENSION:  #  #  YES  #  # Has patient had a PCN reaction causing severe rash involving mucus membranes or skin necrosis: NO PATIENT HAS HAD A PCN REACTION THAT REQUIRED HOSPITALIZATION:  #  #  YES  #  #  Has patient had a PCN reaction occurring within the last 10 years: NO   Red Dye Hives   Sulfa Antibiotics Nausea And Vomiting, Hives   Amitriptyline Other (See Comments)   Crazy dreams    Macrodantin Nausea And Vomiting   Nitrofurantoin Nausea And Vomiting        Medication List        Accurate as of April 15, 2022  9:33 AM. If you have any questions, ask your nurse or doctor.          atorvastatin 40 MG tablet Commonly known as: LIPITOR Take 1 tablet (40 mg total) by mouth daily.  clonazePAM 0.5 MG tablet Commonly known as: KLONOPIN Take one tablet as needed for anxiety no more than once a day.   dicyclomine 10 MG capsule Commonly known as: BENTYL Take 1 capsule (10 mg total) by mouth 2 (two) times daily.   escitalopram 20 MG tablet Commonly known as: LEXAPRO Take 1 tablet by mouth once daily   fluticasone 50 MCG/ACT nasal spray Commonly known as: FLONASE Use 2 spray(s) in each nostril once daily   hydrochlorothiazide 12.5 MG tablet Commonly known as: HYDRODIURIL Take 1 tablet (12.5 mg total) by mouth daily.   lamoTRIgine 100 MG tablet Commonly known as: LaMICtal Take 1 tablet (100 mg  total) by mouth 2 (two) times daily.   levothyroxine 88 MCG tablet Commonly known as: SYNTHROID Take 1 tablet (88 mcg total) by mouth daily. What changed:  medication strength how much to take how to take this when to take this additional instructions Changed by: Angelica Planas, PA-C   lisinopril 40 MG tablet Commonly known as: ZESTRIL Take 40 mg by mouth daily.   ondansetron 8 MG disintegrating tablet Commonly known as: ZOFRAN-ODT Take 1 tablet (8 mg total) by mouth every 8 (eight) hours as needed for nausea.        History (reviewed): Past Medical History:  Diagnosis Date   Allergy    Anxiety    Asthma    Breast cancer ([place]) 2009   Depression    Dyspnea    with heavy pollen    Hypertension    Hypothyroidism    Postmenopausal    Thyroid disease    Past Surgical History:  Procedure Laterality Date   ADENOIDECTOMY     APPENDECTOMY     BREAST RECONSTRUCTION  06/2008   CHOLECYSTECTOMY N/A 11/12/2017   Procedure: LAPAROSCOPIC CHOLECYSTECTOMY WITH INTRAOPERATIVE CHOLANGIOGRAM ERAS PATHWAY;  Surgeon: Angelica Luna, MD;  Location: [place];  Service: General;  Laterality: N/A;   MASTECTOMY  2009   [facility]   TONSILLECTOMY     Family History  Problem Relation Age of Onset   Stroke Mother    Cancer Mother        colon?   Hypertension Father    Hyperlipidemia Father    Social History   Socioeconomic History   Marital status: Married    Spouse name: Angelica [name]   Number of children: 1   Years of education: 14   Highest education level: Some college, no degree  Occupational History   Occupation: Web designer: [employer]    Comment: retired  Tobacco Use   Smoking status: Former    Packs/day: 1.50    Years: 45.00    Total pack years: 67.50    Types: Cigarettes    Quit date: 06/25/2011    Years since quitting: 10.8   Smokeless tobacco: Never  Vaping Use   Vaping Use: Some days  Substance and Sexual Activity   Alcohol use: No   Drug use: No   Sexual activity:  Not Currently  Other Topics Concern   Not on file  Social History Narrative   Lives with daughter, two grand-children and her husband. She has one child. Enjoys doing crafts in her free time.   Social Determinants of Health   Financial Resource Strain: Low Risk  (04/15/2022)   Overall Financial Resource Strain (CARDIA)    Difficulty of Paying Living Expenses: Not hard at all  Food Insecurity: No Food Insecurity (04/15/2022)   Hunger Vital Sign    Worried About Running Out of  Food in the Last Year: Never true    [place] in the Last Year: Never true  Transportation Needs: No Transportation Needs (04/15/2022)   PRAPARE - Hydrologist (Medical): No    Lack of Transportation (Non-Medical): No  Physical Activity: Insufficiently Active (04/15/2022)   Exercise Vital Sign    Days of Exercise per Week: 1 day    Minutes of Exercise per Session: 60 min  Stress: No Stress Concern Present (04/15/2022)   [place]    Feeling of Stress : Not at all  Social Connections: Moderately Isolated (04/15/2022)   Social Connection and Isolation Panel [NHANES]    Frequency of Communication with Friends and Family: Twice a week    Frequency of Social Gatherings with Friends and Family: More than three times a week    Attends Religious Services: Never    Marine scientist or Organizations: No    Attends Archivist Meetings: Never    Marital Status: Married    Activities of Daily Living    04/15/2022    9:22 AM  In your present state of health, do you have any difficulty performing the following activities:  Hearing? 0  Vision? 0  Difficulty concentrating or making decisions? 0  Walking or climbing stairs? 0  Dressing or bathing? 0  Doing errands, shopping? 0  Preparing Food and eating ? N  Using the Toilet? N  In the past six months, have you accidently leaked urine? N  Do you  have problems with loss of bowel control? N  Managing your Medications? N  Managing your Finances? N  Housekeeping or managing your Housekeeping? N    Patient Education/ Literacy How often do you need to have someone help you when you read instructions, pamphlets, or other written materials from your doctor or pharmacy?: 1 - Never What is the last grade level you completed in school?: [employer]  Exercise Current Exercise Habits: Home exercise routine, Type of exercise: walking;Other - see comments (therapy), Time (Minutes): 20, Frequency (Times/Week): 7, Weekly Exercise (Minutes/Week): 140, Intensity: Mild, Exercise limited by: None identified  Diet Patient reports consuming 2 meals a day and 3 snack(s) a day Patient reports that her primary diet is: Regular Patient reports that she does have regular access to food.   Depression Screen    04/15/2022    9:17 AM 04/12/2022    9:53 AM 10/16/2021    2:44 PM 04/13/2021   11:04 AM 10/16/2020   11:02 AM 04/10/2020   10:37 AM 01/04/2020    9:20 AM  PHQ 2/9 Scores  PHQ - 2 Score $Remov'2 2 2 'uFbxHv$ 0 0 0 0  PHQ- 9 Score $Remov'11 11 13  10 5 9     'bczNNH$ Fall Risk    04/15/2022    9:17 AM 04/12/2022    9:53 AM 10/16/2021    2:44 PM 04/13/2021   11:04 AM 01/04/2020    9:20 AM  Fall Risk   Falls in the past year? 1 1 0 0 1  Number falls in past yr: 1 1 0 0 0  Injury with Fall? 1 1 0 0 0  Risk for fall due to : History of fall(s) History of fall(s) No Fall Risks No Fall Risks History of fall(s)  Follow up Falls evaluation completed;Education provided;Falls prevention discussed Falls evaluation completed Falls evaluation completed Falls evaluation completed Falls evaluation completed     Objective:  Angelica [name] seemed alert and oriented and she participated appropriately during our telephone visit.  Blood Pressure Weight BMI  BP Readings from Last 3 Encounters:  04/12/22 (!) 130/59  03/15/22 130/61  12/08/21 (!) 111/54   Wt Readings from Last 3 Encounters:   04/12/22 167 lb (75.8 kg)  03/15/22 168 lb (76.2 kg)  03/08/22 166 lb (75.3 kg)   BMI Readings from Last 1 Encounters:  04/12/22 29.58 kg/m    *Unable to obtain current vital signs, weight, and BMI due to telephone visit type  Hearing/Vision  Nevin Bloodgood did not seem to have difficulty with hearing/understanding during the telephone conversation Reports that she has had a formal eye exam by an eye care professional within the past year Reports that she has not had a formal hearing evaluation within the past year *Unable to fully assess hearing and vision during telephone visit type  Cognitive Function:    04/15/2022    9:25 AM 04/13/2021   11:14 AM 12/22/2019   10:09 AM 12/21/2018    1:16 PM  6CIT Screen  What Year? 0 points 0 points 0 points 0 points  What month? 0 points 0 points 0 points 0 points  What time? 0 points 0 points 0 points 0 points  Count back from 20 0 points 0 points 0 points 0 points  Months in reverse 0 points 0 points 0 points 0 points  Repeat phrase 0 points 0 points 0 points 0 points  Total Score 0 points 0 points 0 points 0 points   (Normal:0-7, Significant for Dysfunction: >8)  Normal Cognitive Function Screening: Yes   Immunization & Health Maintenance Record Immunization History  Administered Date(s) Administered   Fluad Quad(high Dose 65+) 04/17/2021, 04/12/2022   Influenza Split 06/24/2012   Influenza Whole 03/29/2009   Influenza, High Dose Seasonal PF 02/26/2017, 04/02/2018   Influenza,inj,Quad PF,6+ Mos 03/14/2014, 03/08/2015, 05/14/2016, 03/02/2019   Influenza,trivalent, recombinat, inj, PF 05/29/2012   Influenza-Unspecified 05/29/2012, 02/26/2017, 04/02/2018, 04/02/2020   Moderna Sars-Covid-2 Vaccination 08/07/2019, 09/04/2019, 05/27/2020   Pfizer Covid-19 Vaccine Bivalent Booster 40yrs & up 05/16/2021   Pneumococcal Conjugate-13 02/26/2017   Pneumococcal Polysaccharide-23 05/29/2012, 06/24/2012, 08/31/2018   Pneumococcal-Unspecified  02/26/2017, 04/02/2020   Tdap 01/31/2011   Zoster, Live 01/31/2011    Health Maintenance  Topic Date Due   COVID-19 Vaccine (5 - Moderna risk series) 04/28/2022 (Originally 07/11/2021)   Zoster Vaccines- Shingrix (1 of 2) 07/13/2022 (Originally 09/25/1969)   TETANUS/TDAP  04/16/2023 (Originally 01/30/2021)   COLONOSCOPY (Pts 45-35yrs Insurance coverage will need to be confirmed)  03/31/2023   MAMMOGRAM  05/09/2023   Pneumonia Vaccine 63+ Years old  Completed   INFLUENZA VACCINE  Completed   DEXA SCAN  Completed   Hepatitis C Screening  Completed   HPV VACCINES  Aged Out       Assessment  This is a routine wellness examination for Angelica [name].  Health Maintenance: Due or Overdue There are no preventive care reminders to display for this patient.   Angelica [name] does not need a referral for Community Assistance: Care Management:   no Social Work:    no Prescription Assistance:  no Nutrition/Diabetes Education:  no   Plan:  Personalized Goals  Goals Addressed               This Visit's Progress     Patient Stated (pt-stated)        04/15/2022 AWV Goal: Exercise for General Health  Patient will verbalize understanding of the  benefits of increased physical activity: Exercising regularly is important. It will improve your overall fitness, flexibility, and endurance. Regular exercise also will improve your overall health. It can help you control your weight, reduce stress, and improve your bone density. Over the next year, patient will increase physical activity as tolerated with a goal of at least 150 minutes of moderate physical activity per week.  You can tell that you are exercising at a moderate intensity if your heart starts beating faster and you start breathing faster but can still hold a conversation. Moderate-intensity exercise ideas include: Walking 1 mile (1.6 km) in about 15 minutes Biking Hiking Golfing Dancing Water aerobics Patient will verbalize  understanding of everyday activities that increase physical activity by providing examples like the following: Yard work, such as: Sales promotion account executive Gardening Washing windows or floors Patient will be able to explain general safety guidelines for exercising:  Before you start a new exercise program, talk with your health care provider. Do not exercise so much that you hurt yourself, feel dizzy, or get very short of breath. Wear comfortable clothes and wear shoes with good support. Drink plenty of water while you exercise to prevent dehydration or heat stroke. Work out until your breathing and your heartbeat get faster.        Personalized Health Maintenance & Screening Recommendations  Td vaccine Shingrix vaccine Mammogram scheduled for 05/09/22.  Lung Cancer Screening Recommended: yes; patient declined at this time. (Low Dose CT Chest recommended if Age 64-80 years, 30 pack-year currently smoking OR have quit w/in past 15 years) Hepatitis C Screening recommended: no HIV Screening recommended: no  Advanced Directives: Written information was not prepared per patient's request.  Referrals & Orders No orders of the defined types were placed in this encounter.   Follow-up Plan Follow-up with Donella Stade, PA-C as planned Schedule your shignrix and tetatus at your pharmacy.  Medicare wellness visit in one year. Patient will access AVS on my chart.   I have personally reviewed and noted the following in the patient's chart:   Medical and social history Use of alcohol, tobacco or illicit drugs  Current medications and supplements Functional ability and status Nutritional status Physical activity Advanced directives List of other physicians Hospitalizations, surgeries, and ER visits in previous 12 months Vitals Screenings to include cognitive, depression, and falls Referrals and  appointments  In addition, I have reviewed and discussed with Angelica [name] certain preventive protocols, quality metrics, and best practice recommendations. A written personalized care plan for preventive services as well as general preventive health recommendations is available and can be mailed to the patient at her request.      Tinnie Gens, RN BSN  04/15/2022

## 2022-04-16 ENCOUNTER — Ambulatory Visit: Payer: Medicare Other | Admitting: Physical Therapy

## 2022-04-17 ENCOUNTER — Ambulatory Visit: Payer: Medicare Other | Admitting: Physician Assistant

## 2022-04-19 ENCOUNTER — Encounter: Payer: Self-pay | Admitting: Physician Assistant

## 2022-04-25 ENCOUNTER — Encounter: Payer: Self-pay | Admitting: Physical Therapy

## 2022-04-25 ENCOUNTER — Ambulatory Visit: Payer: Medicare Other | Attending: Physician Assistant | Admitting: Physical Therapy

## 2022-04-25 DIAGNOSIS — M25562 Pain in left knee: Secondary | ICD-10-CM | POA: Insufficient documentation

## 2022-04-25 DIAGNOSIS — Z9181 History of falling: Secondary | ICD-10-CM | POA: Diagnosis not present

## 2022-04-25 DIAGNOSIS — M6281 Muscle weakness (generalized): Secondary | ICD-10-CM | POA: Insufficient documentation

## 2022-04-25 DIAGNOSIS — R2689 Other abnormalities of gait and mobility: Secondary | ICD-10-CM | POA: Insufficient documentation

## 2022-04-25 DIAGNOSIS — R2681 Unsteadiness on feet: Secondary | ICD-10-CM | POA: Insufficient documentation

## 2022-04-25 DIAGNOSIS — G8929 Other chronic pain: Secondary | ICD-10-CM | POA: Insufficient documentation

## 2022-04-25 NOTE — Therapy (Signed)
OUTPATIENT PHYSICAL THERAPY TREATMENT   Patient Name: Angelica [name] MRN: [id number] DOB:1950-09-06, 71 y.o., female Today's Date: 04/25/2022   PCP: Lavada Mesi REFERRING PROVIDER: Donella Stade, PA-C   PT End of Session - 04/25/22 0930     Visit Number 4    Number of Visits 7    Date for PT Re-Evaluation 05/20/22    Authorization Type [employer]    Authorization - Visit Number 3    Progress Note Due on Visit 10    PT Start Time 0930    PT Stop Time 1010    PT Time Calculation (min) 40 min    Activity Tolerance Patient tolerated treatment well    Behavior During Therapy WFL for tasks assessed/performed               Past Medical History:  Diagnosis Date   Allergy    Anxiety    Asthma    Breast cancer ([place]) 2009   Depression    Dyspnea    with heavy pollen    Hypertension    Hypothyroidism    Postmenopausal    Thyroid disease    Past Surgical History:  Procedure Laterality Date   ADENOIDECTOMY     APPENDECTOMY     BREAST RECONSTRUCTION  06/2008   CHOLECYSTECTOMY N/A 11/12/2017   Procedure: LAPAROSCOPIC CHOLECYSTECTOMY WITH INTRAOPERATIVE CHOLANGIOGRAM ERAS PATHWAY;  Surgeon: Erroll Luna, MD;  Location: [facility];  Service: General;  Laterality: N/A;   MASTECTOMY  2009   BrCa   TONSILLECTOMY     Patient Active Problem List   Diagnosis Date Noted   Fall 03/08/2022   Primary osteoarthritis of left knee 12/19/2021   Hirsutism 10/19/2021   Night sweats 10/19/2021   Personal history of breast cancer 10/15/2021   Diverticular disease of colon 10/15/2021   Osteopenia 05/10/2021   Injection site reaction 04/04/2020   Rotator cuff syndrome, left 05/26/2019   Neck fullness 08/31/2018   Colon polyps 03/31/2018   Left-sided nosebleed 03/02/2018   TMJ pain dysfunction syndrome 03/02/2018   Calculus of gallbladder 10/21/2017   Elevated lipase 10/21/2017   Diarrhea 10/21/2017   Upper abdominal pain 10/21/2017   Cellulitis and abscess of buttock  06/04/2017   Essential hypertension 06/11/2016   Epistaxis 11/14/2015   Trapezius muscle spasm 09/28/2015   Elevated blood pressure 04/26/2015   Dyslipidemia (high LDL; low HDL) 03/09/2015   IBS (irritable bowel syndrome) 08/22/2014   Insomnia 03/15/2014   Chondromalacia of right patellofemoral joint 02/10/2014   Anxiety and depression 08/03/2013   Episodic mood disorder ([place]) 10/17/2012   Breast cancer ([place]) 01/31/2011   GAD (generalized anxiety disorder) 01/31/2011   Asthma 01/31/2011   Seasonal allergies 01/31/2011   Hypothyroid 01/31/2011    ONSET DATE: 3 weeks ago  REFERRING DIAG:  F07.81 (ICD-10-CM) - Post concussion syndrome  R26.81 (ICD-10-CM) - Gait instability  R26.89 (ICD-10-CM) - Balance problems    THERAPY DIAG:  Unsteadiness on feet  Other abnormalities of gait and mobility  History of falling  Rationale for Evaluation and Treatment Rehabilitation  SUBJECTIVE:  SUBJECTIVE STATEMENT: Pt reports she's been practicing walking backwards.  Pt accompanied by: self  PERTINENT HISTORY: PT for knee pain  PAIN:  Are you having pain? No  PRECAUTIONS: Fall  WEIGHT BEARING RESTRICTIONS No  FALLS: Has patient fallen in last 6 months? Yes. Number of falls 1  LIVING ENVIRONMENT: Lives with: lives with their family Lives in: House/apartment Stairs: Yes: Internal: 12 steps; on left going up Has following equipment at home: None  PLOF: Independent  PATIENT GOALS Improve balance  OBJECTIVE:   FROM EVAL  LOWER EXTREMITY MMT:    MMT Right Eval Left Eval  Hip flexion 5 5  Hip extension    Hip abduction 4 4  Hip adduction    Hip internal rotation    Hip external rotation    Knee flexion 5 5  Knee extension 5 5  Ankle dorsiflexion    Ankle plantarflexion     Ankle inversion    Ankle eversion    (Blank rows = not tested)   STAIRS:  Level of Assistance: Modified independence  Stair Negotiation Technique: Alternating Pattern  with Bilateral Rails  Number of Stairs: 10   Height of Stairs: 4-6"  Comments: Reciprocal pattern  GAIT: Gait pattern: WFL Distance walked: 100' Assistive device utilized: None Level of assistance: Complete Independence Comments: none  FUNCTIONAL TESTs:  From eval 5 times sit to stand: 11 sec Timed up and go (TUG): 14 sec Berg Balance Scale: 48 Functional gait assessment: 19 MCTSIB: Condition 1: Avg of 3 trials: 30 sec, Condition 2: Avg of 3 trials: 30 sec, Condition 3: Avg of 3 trials: 30 sec, Condition 4: Avg of 3 trials: 20 sec, and Total Score: 110/120  04/25/22 Berg Balance Score: 54/56  [facility] PT Assessment - 04/25/22 0001       Berg Balance Test   Sit to Stand Able to stand without using hands and stabilize independently    Standing Unsupported Able to stand safely 2 minutes    Sitting with Back Unsupported but Feet Supported on Floor or Stool Able to sit safely and securely 2 minutes    Stand to Sit Sits safely with minimal use of hands    Transfers Able to transfer safely, minor use of hands    Standing Unsupported with Eyes Closed Able to stand 10 seconds safely    Standing Unsupported with Feet Together Able to place feet together independently and stand 1 minute safely    From Standing, Reach Forward with Outstretched Arm Can reach confidently >25 cm (10")    From Standing Position, Pick up Object from Floor Able to pick up shoe safely and easily    From Standing Position, Turn to Look Behind Over each Shoulder Looks behind from both sides and weight shifts well    Turn 360 Degrees Able to turn 360 degrees safely in 4 seconds or less    Standing Unsupported, Alternately Place Feet on Step/Stool Able to stand independently and safely and complete 8 steps in 20 seconds    Standing Unsupported, One  Foot in [place] to place foot tandem independently and hold 30 seconds    Standing on One Leg Able to lift leg independently and hold equal to or more than 3 seconds    Total Score 54    Berg comment: 54/56              PATIENT SURVEYS:  ABC scale 83.12%  from eval  TODAY'S TREATMENT:  04/25/22: Treadmill x 5 min for  warm up  Standing Reach forward 10" stacking cones on firm surface 2x10, on foam 2x10 Body turns on airex stacking cones x10 R&L Tandem stance 2x30 sec Tandem walking 2x10'  On airex: Feet apart and then together EC 2x30 sec Feet apart EC head nods 2x30 sec, head turns 2x30 sec   04/10/22: Recumbent bike L1 x 6 min  Standing Tandem stance 2x30 sec Tandem walking 4x10 steps Feet together EC head nods 2x30 sec Feet together EC head turns 2x30 sec Backwards walking with intermittent stops x 2 laps around gym [place] 2x15'  On airex Feet together EC static x30 sec Feet apart EC static x30 sec Feet together EO head nods 2x30 sec Feet together EO head turns 2x30 sec Slow marching x20   04/03/22: Treadmill warm up 1.0 mph x 5 min  Standing Heel raise no support x20 Knee flexion no UE support x20 Slow marching x20 Four square stepping x5 CW & CCW Backwards stepping over poles x 10 Tandem walking x 15' with 5 sec holds Tandem stance L LE back 2x30 sec On 6" step runner's step up 2x10  On airex Feet together eyes closed 2x30 sec Feet together eyes open, head turns and head nods 2x10     PATIENT EDUCATION: Education details: Exam findings, POC Person educated: Patient Education method: Explanation Education comprehension: verbalized understanding and needs further education    HOME EXERCISE PROGRAM: Access Code: [email or web address] URL: [email or web address] Date: 04/03/2022 Prepared by: Estill Bamberg April Thurnell Garbe  Exercises - Tandem Stance  - 1 x daily - 7 x weekly - 2 sets - 30 sec hold - Romberg Stance Eyes Closed on Foam  Pad  - 1 x daily - 7 x weekly - 2 sets - 30 sec hold - Narrow Stance with Eyes Closed and Head Nods on Foam Pad  - 1 x daily - 7 x weekly - 2 sets - 30 sec hold - Narrow Stance with Eyes Closed and Head Rotation on Foam Pad  - 1 x daily - 7 x weekly - 2 sets - 30 sec hold - Runner's Step Up/Down  - 1 x daily - 7 x weekly - 2 sets - 10 reps    GOALS: Goals reviewed with patient? Yes   LONG TERM GOALS: Target date: 05/06/2022  Pt will be ind with balance HEP Baseline:  Goal status: IN PROGRESS  2.  Pt will demo at least 22/30 on FGA for decreased fall risk Baseline:  Goal status: IN PROGRESS  3.  Pt will have increased Berg Balance Score to at least 54/56 for low fall risk Baseline:  Goal status: MET  4.  Pt will have improved ABC to at least 90% Baseline:  Goal status: IN PROGRESS    ASSESSMENT:  CLINICAL IMPRESSION: Treatment focused on progressing pt's balance. Pt with improved Berg Balance Score -- has met her LTG. Pt continues to have difficulty with her balance and eyes closed but this has also continued to improve.    OBJECTIVE IMPAIRMENTS decreased balance, decreased coordination, and decreased mobility.   ACTIVITY LIMITATIONS carrying, lifting, stairs, and locomotion level  PARTICIPATION LIMITATIONS: cleaning, community activity, and yard work  PERSONAL FACTORS Age, Past/current experiences, and Time since onset of injury/illness/exacerbation are also affecting patient's functional outcome.   REHAB POTENTIAL: Good  CLINICAL DECISION MAKING: Stable/uncomplicated  EVALUATION COMPLEXITY: Low  PLAN: PT FREQUENCY: 1x/week  PT DURATION: 6 weeks  PLANNED INTERVENTIONS: Therapeutic exercises, Therapeutic activity, Neuromuscular re-education, Balance training, Gait training, Patient/Family education, Self  Care, Joint mobilization, Stair training, Aquatic Therapy, Electrical stimulation, Cryotherapy, Moist heat, Taping, Ionotophoresis 26m/ml Dexamethasone, Manual  therapy, and Re-evaluation  PLAN FOR NEXT SESSION: Continue progressing balance. Work primarily on dynamic balance, single leg stance and multi directional stepping   Tomasita Beevers April MGordy Levan PT, DPT 04/25/2022, 9:31 AM

## 2022-04-30 NOTE — Therapy (Signed)
OUTPATIENT PHYSICAL THERAPY TREATMENT   Patient Name: Angelica [name] MRN: [id number] DOB:[birth year], 71 y.o., female Today's Date: 05/02/2022   PCP: Lavada Mesi REFERRING PROVIDER: Lavada Mesi   PT End of Session - 05/02/22 0931     Visit Number 5    Number of Visits 7    Date for PT Re-Evaluation 05/20/22    Authorization Type [employer]    Authorization - Visit Number 4    Progress Note Due on Visit 10    PT Start Time 0930    PT Stop Time 1013    PT Time Calculation (min) 43 min               Past Medical History:  Diagnosis Date   Allergy    Anxiety    Asthma    Breast cancer ([place]) 2009   Depression    Dyspnea    with heavy pollen    Hypertension    Hypothyroidism    Postmenopausal    Thyroid disease    Past Surgical History:  Procedure Laterality Date   ADENOIDECTOMY     APPENDECTOMY     BREAST RECONSTRUCTION  06/2008   CHOLECYSTECTOMY N/A 11/12/2017   Procedure: LAPAROSCOPIC CHOLECYSTECTOMY WITH INTRAOPERATIVE CHOLANGIOGRAM ERAS PATHWAY;  Surgeon: Erroll Luna, MD;  Location: [facility];  Service: General;  Laterality: N/A;   MASTECTOMY  2009   BrCa   TONSILLECTOMY     Patient Active Problem List   Diagnosis Date Noted   Fall 03/08/2022   Primary osteoarthritis of left knee 12/19/2021   Hirsutism 10/19/2021   Night sweats 10/19/2021   Personal history of breast cancer 10/15/2021   Diverticular disease of colon 10/15/2021   Osteopenia 05/10/2021   Injection site reaction 04/04/2020   Rotator cuff syndrome, left 05/26/2019   Neck fullness 08/31/2018   Colon polyps 03/31/2018   Left-sided nosebleed 03/02/2018   TMJ pain dysfunction syndrome 03/02/2018   Calculus of gallbladder 10/21/2017   Elevated lipase 10/21/2017   Diarrhea 10/21/2017   Upper abdominal pain 10/21/2017   Cellulitis and abscess of buttock 06/04/2017   Essential hypertension 06/11/2016   Epistaxis 11/14/2015   Trapezius muscle spasm 09/28/2015   Elevated  blood pressure 04/26/2015   Dyslipidemia (high LDL; low HDL) 03/09/2015   IBS (irritable bowel syndrome) 08/22/2014   Insomnia 03/15/2014   Chondromalacia of right patellofemoral joint 02/10/2014   Anxiety and depression 08/03/2013   Episodic mood disorder ([place]) 10/17/2012   Breast cancer ([place]) 01/31/2011   GAD (generalized anxiety disorder) 01/31/2011   Asthma 01/31/2011   Seasonal allergies 01/31/2011   Hypothyroid 01/31/2011    ONSET DATE: 3 weeks ago  REFERRING DIAG:  F07.81 (ICD-10-CM) - Post concussion syndrome  R26.81 (ICD-10-CM) - Gait instability  R26.89 (ICD-10-CM) - Balance problems    THERAPY DIAG:  Unsteadiness on feet  Other abnormalities of gait and mobility  History of falling  Chronic pain of left knee  Muscle weakness (generalized)  Rationale for Evaluation and Treatment Rehabilitation  SUBJECTIVE:  SUBJECTIVE STATEMENT: No new complaints. Reports she feels like she is improving overall with her balance. Pt accompanied by: self  PERTINENT HISTORY: PT for knee pain  PAIN:  Are you having pain? No  PRECAUTIONS: Fall  WEIGHT BEARING RESTRICTIONS No  FALLS: Has patient fallen in last 6 months? Yes. Number of falls 1  LIVING ENVIRONMENT: Lives with: lives with their family Lives in: House/apartment Stairs: Yes: Internal: 12 steps; on left going up Has following equipment at home: None  PLOF: Independent  PATIENT GOALS Improve balance  OBJECTIVE:   FROM EVAL  LOWER EXTREMITY MMT:    MMT Right Eval Left Eval  Hip flexion 5 5  Hip extension    Hip abduction 4 4  Hip adduction    Hip internal rotation    Hip external rotation    Knee flexion 5 5  Knee extension 5 5  Ankle dorsiflexion    Ankle plantarflexion    Ankle inversion    Ankle  eversion    (Blank rows = not tested)   STAIRS:  Level of Assistance: Modified independence  Stair Negotiation Technique: Alternating Pattern  with Bilateral Rails  Number of Stairs: 10   Height of Stairs: 4-6"  Comments: Reciprocal pattern  GAIT: Gait pattern: WFL Distance walked: 100' Assistive device utilized: None Level of assistance: Complete Independence Comments: none  FUNCTIONAL TESTs:  From eval 5 times sit to stand: 11 sec Timed up and go (TUG): 14 sec Berg Balance Scale: 48 Functional gait assessment: 19 MCTSIB: Condition 1: Avg of 3 trials: 30 sec, Condition 2: Avg of 3 trials: 30 sec, Condition 3: Avg of 3 trials: 30 sec, Condition 4: Avg of 3 trials: 20 sec, and Total Score: 110/120  04/25/22 Berg Balance Score: 54/56     PATIENT SURVEYS:  ABC scale 83.12%  from eval  TODAY'S TREATMENT:   05/02/22  Treadmill x 5 min for warm up  Standing Agility ladder: various stepping patterns to challege balance (Diagonals most difficult) Braiding 4x20 feet front and back separately; combo too difficult coordination wise Bwd walking eyes closed/fwd walking eyes closed  Tandem walking 2x10' second one with eyes forward  Foam: Stepping clocks x 5 each way Step ups x 10 ea Lateral step across x 5 each way    04/25/22: Treadmill x 5 min for warm up  Standing Reach forward 10" stacking cones on firm surface 2x10, on foam 2x10 Body turns on airex stacking cones x10 R&L Tandem stance 2x30 sec Tandem walking 2x10'  On airex: Feet apart and then together EC 2x30 sec Feet apart EC head nods 2x30 sec, head turns 2x30 sec   04/10/22: Recumbent bike L1 x 6 min  Standing Tandem stance 2x30 sec Tandem walking 4x10 steps Feet together EC head nods 2x30 sec Feet together EC head turns 2x30 sec Backwards walking with intermittent stops x 2 laps around gym [place] 2x15'  On airex Feet together EC static x30 sec Feet apart EC static x30 sec Feet together EO  head nods 2x30 sec Feet together EO head turns 2x30 sec Slow marching x20      PATIENT EDUCATION: Education details: Exam findings, POC Person educated: Patient Education method: Explanation Education comprehension: verbalized understanding and needs further education    HOME EXERCISE PROGRAM: Access Code: [email or web address] URL: [email or web address] Date: 04/03/2022 Prepared by: Estill Bamberg April Thurnell Garbe  Exercises - Tandem Stance  - 1 x daily - 7 x weekly - 2 sets - 30 sec hold - Romberg Stance  Eyes Closed on Foam Pad  - 1 x daily - 7 x weekly - 2 sets - 30 sec hold - Narrow Stance with Eyes Closed and Head Nods on Foam Pad  - 1 x daily - 7 x weekly - 2 sets - 30 sec hold - Narrow Stance with Eyes Closed and Head Rotation on Foam Pad  - 1 x daily - 7 x weekly - 2 sets - 30 sec hold - Runner's Step Up/Down  - 1 x daily - 7 x weekly - 2 sets - 10 reps    GOALS: Goals reviewed with patient? Yes   LONG TERM GOALS: Target date: 05/06/2022  Pt will be ind with balance HEP Baseline:  Goal status: IN PROGRESS  2.  Pt will demo at least 22/30 on FGA for decreased fall risk Baseline:  Goal status: IN PROGRESS  3.  Pt will have increased Berg Balance Score to at least 54/56 for low fall risk Baseline:  Goal status: MET  4.  Pt will have improved ABC to at least 90% Baseline:  Goal status: IN PROGRESS    ASSESSMENT:  CLINICAL IMPRESSION: Evana did well with new balance challenges. Some LOB with agility activities and foam clocks, but overall good tolerance. Left LE fatigue affects her balance. She continues to demonstrate potential for improvement and would benefit from continued skilled therapy to address impairments.     OBJECTIVE IMPAIRMENTS decreased balance, decreased coordination, and decreased mobility.   ACTIVITY LIMITATIONS carrying, lifting, stairs, and locomotion level  PARTICIPATION LIMITATIONS: cleaning, community activity, and yard  work  PERSONAL FACTORS Age, Past/current experiences, and Time since onset of injury/illness/exacerbation are also affecting patient's functional outcome.   REHAB POTENTIAL: Good  CLINICAL DECISION MAKING: Stable/uncomplicated  EVALUATION COMPLEXITY: Low  PLAN: PT FREQUENCY: 1x/week  PT DURATION: 6 weeks  PLANNED INTERVENTIONS: Therapeutic exercises, Therapeutic activity, Neuromuscular re-education, Balance training, Gait training, Patient/Family education, Self Care, Joint mobilization, Stair training, Aquatic Therapy, Electrical stimulation, Cryotherapy, Moist heat, Taping, Ionotophoresis 29m/ml Dexamethasone, Manual therapy, and Re-evaluation  PLAN FOR NEXT SESSION: Continue progressing balance. Work primarily on dynamic balance, single leg stance and multi directional stepping   Reem Fleury, PT 05/02/2022, 10:15 AM

## 2022-05-02 ENCOUNTER — Ambulatory Visit: Payer: Medicare Other | Admitting: Physical Therapy

## 2022-05-02 ENCOUNTER — Encounter: Payer: Self-pay | Admitting: Physical Therapy

## 2022-05-02 DIAGNOSIS — Z9181 History of falling: Secondary | ICD-10-CM

## 2022-05-02 DIAGNOSIS — R2689 Other abnormalities of gait and mobility: Secondary | ICD-10-CM

## 2022-05-02 DIAGNOSIS — M25562 Pain in left knee: Secondary | ICD-10-CM | POA: Diagnosis not present

## 2022-05-02 DIAGNOSIS — G8929 Other chronic pain: Secondary | ICD-10-CM

## 2022-05-02 DIAGNOSIS — R2681 Unsteadiness on feet: Secondary | ICD-10-CM | POA: Diagnosis not present

## 2022-05-02 DIAGNOSIS — M6281 Muscle weakness (generalized): Secondary | ICD-10-CM

## 2022-05-09 ENCOUNTER — Encounter: Payer: Self-pay | Admitting: Physical Therapy

## 2022-05-09 ENCOUNTER — Ambulatory Visit: Payer: Medicare Other | Admitting: Physical Therapy

## 2022-05-09 ENCOUNTER — Ambulatory Visit: Payer: Medicare PPO

## 2022-05-09 DIAGNOSIS — R2689 Other abnormalities of gait and mobility: Secondary | ICD-10-CM | POA: Diagnosis not present

## 2022-05-09 DIAGNOSIS — Z9181 History of falling: Secondary | ICD-10-CM

## 2022-05-09 DIAGNOSIS — M6281 Muscle weakness (generalized): Secondary | ICD-10-CM

## 2022-05-09 DIAGNOSIS — G8929 Other chronic pain: Secondary | ICD-10-CM | POA: Diagnosis not present

## 2022-05-09 DIAGNOSIS — R2681 Unsteadiness on feet: Secondary | ICD-10-CM

## 2022-05-09 DIAGNOSIS — M25562 Pain in left knee: Secondary | ICD-10-CM | POA: Diagnosis not present

## 2022-05-09 NOTE — Therapy (Signed)
OUTPATIENT PHYSICAL THERAPY TREATMENT AND DISCHARGE   Patient Name: Angelica [name] MRN: [id number] DOB:March 06, 1951, 70 y.o., female Today's Date: 05/09/2022  PHYSICAL THERAPY DISCHARGE SUMMARY  Visits from Start of Care: 6  Current functional level related to goals / functional outcomes: See below   Remaining deficits: See below   Education / Equipment: See below   Patient agrees to discharge. Patient goals were met. Patient is being discharged due to meeting the stated rehab goals.   PCP: Lavada Mesi REFERRING PROVIDER: Lavada Mesi   PT End of Session - 05/09/22 0940     Visit Number 6    Number of Visits 7    Date for PT Re-Evaluation 05/20/22    Authorization Type [employer]    Authorization - Visit Number 5    Progress Note Due on Visit 10    PT Start Time [id number]   late arrival   PT Stop Time 1015    PT Time Calculation (min) 37 min    Activity Tolerance Patient tolerated treatment well    Behavior During Therapy WFL for tasks assessed/performed               Past Medical History:  Diagnosis Date   Allergy    Anxiety    Asthma    Breast cancer ([place]) 2009   Depression    Dyspnea    with heavy pollen    Hypertension    Hypothyroidism    Postmenopausal    Thyroid disease    Past Surgical History:  Procedure Laterality Date   ADENOIDECTOMY     APPENDECTOMY     BREAST RECONSTRUCTION  06/2008   CHOLECYSTECTOMY N/A 11/12/2017   Procedure: LAPAROSCOPIC CHOLECYSTECTOMY WITH INTRAOPERATIVE CHOLANGIOGRAM ERAS PATHWAY;  Surgeon: Erroll Luna, MD;  Location: [place];  Service: General;  Laterality: N/A;   MASTECTOMY  2009   BrCa   TONSILLECTOMY     Patient Active Problem List   Diagnosis Date Noted   Fall 03/08/2022   Primary osteoarthritis of left knee 12/19/2021   Hirsutism 10/19/2021   Night sweats 10/19/2021   Personal history of breast cancer 10/15/2021   Diverticular disease of colon 10/15/2021   Osteopenia 05/10/2021    Injection site reaction 04/04/2020   Rotator cuff syndrome, left 05/26/2019   Neck fullness 08/31/2018   Colon polyps 03/31/2018   Left-sided nosebleed 03/02/2018   TMJ pain dysfunction syndrome 03/02/2018   Calculus of gallbladder 10/21/2017   Elevated lipase 10/21/2017   Diarrhea 10/21/2017   Upper abdominal pain 10/21/2017   Cellulitis and abscess of buttock 06/04/2017   Essential hypertension 06/11/2016   Epistaxis 11/14/2015   Trapezius muscle spasm 09/28/2015   Elevated blood pressure 04/26/2015   Dyslipidemia (high LDL; low HDL) 03/09/2015   IBS (irritable bowel syndrome) 08/22/2014   Insomnia 03/15/2014   Chondromalacia of right patellofemoral joint 02/10/2014   Anxiety and depression 08/03/2013   Episodic mood disorder ([place]) 10/17/2012   Breast cancer ([place]) 01/31/2011   GAD (generalized anxiety disorder) 01/31/2011   Asthma 01/31/2011   Seasonal allergies 01/31/2011   Hypothyroid 01/31/2011    ONSET DATE: 3 weeks ago  REFERRING DIAG:  F07.81 (ICD-10-CM) - Post concussion syndrome  R26.81 (ICD-10-CM) - Gait instability  R26.89 (ICD-10-CM) - Balance problems    THERAPY DIAG:  Unsteadiness on feet  Other abnormalities of gait and mobility  History of falling  Chronic pain of left knee  Muscle weakness (generalized)  Rationale for Evaluation and Treatment Rehabilitation  SUBJECTIVE:                                                                                                                                                                                              SUBJECTIVE STATEMENT: Pt reports nothing new or different. Had multiple detours coming into therapy today. Backwards walking is still the hardest. Pt accompanied by: self  PERTINENT HISTORY: PT for knee pain  PAIN:  Are you having pain? No  PRECAUTIONS: Fall  WEIGHT BEARING RESTRICTIONS No  FALLS: Has patient fallen in last 6 months? Yes. Number of falls 1  LIVING  ENVIRONMENT: Lives with: lives with their family Lives in: House/apartment Stairs: Yes: Internal: 12 steps; on left going up Has following equipment at home: None  PLOF: Independent  PATIENT GOALS Improve balance  OBJECTIVE:   FROM EVAL  LOWER EXTREMITY MMT:    MMT Right Eval Left Eval  Hip flexion 5 5  Hip extension    Hip abduction 4 4  Hip adduction    Hip internal rotation    Hip external rotation    Knee flexion 5 5  Knee extension 5 5  Ankle dorsiflexion    Ankle plantarflexion    Ankle inversion    Ankle eversion    (Blank rows = not tested)   STAIRS:  Level of Assistance: Modified independence  Stair Negotiation Technique: Alternating Pattern  with Bilateral Rails  Number of Stairs: 10   Height of Stairs: 4-6"  Comments: Reciprocal pattern  GAIT: Gait pattern: WFL Distance walked: 100' Assistive device utilized: None Level of assistance: Complete Independence Comments: none  FUNCTIONAL TESTs:  From eval 5 times sit to stand: 11 sec Timed up and go (TUG): 14 sec Berg Balance Scale: 48 Functional gait assessment: 19 MCTSIB: Condition 1: Avg of 3 trials: 30 sec, Condition 2: Avg of 3 trials: 30 sec, Condition 3: Avg of 3 trials: 30 sec, Condition 4: Avg of 3 trials: 20 sec, and Total Score: 110/120  04/25/22 Berg Balance Score: 54/56  05/09/22 FGA: 25/30 MCTSIB condition 4: 30 sec   PATIENT SURVEYS:  ABC scale 83.12%  from eval ABC scale 95.63% on 11/16  TODAY'S TREATMENT:  05/09/22 Nustep L6 x 5 min LEs only   05/02/22  Treadmill x 5 min for warm up  Standing Agility ladder: various stepping patterns to challege balance (Diagonals most difficult) Braiding 4x20 feet front and back separately; combo too difficult coordination wise Bwd walking eyes closed/fwd walking eyes closed  Tandem walking 2x10' second one with eyes forward  Foam: Stepping clocks x 5 each way Step ups x 10 ea  Lateral step across x 5 each  way    04/25/22: Treadmill x 5 min for warm up  Standing Reach forward 10" stacking cones on firm surface 2x10, on foam 2x10 Body turns on airex stacking cones x10 R&L Tandem stance 2x30 sec Tandem walking 2x10'  On airex: Feet apart and then together EC 2x30 sec Feet apart EC head nods 2x30 sec, head turns 2x30 sec       PATIENT EDUCATION: Education details: Exam findings, POC Person educated: Patient Education method: Explanation Education comprehension: verbalized understanding and needs further education    HOME EXERCISE PROGRAM: Access Code: [email or web address] URL: [email or web address] Date: 04/03/2022 Prepared by: Estill Bamberg April Thurnell Garbe  Exercises - Tandem Stance  - 1 x daily - 7 x weekly - 2 sets - 30 sec hold - Romberg Stance Eyes Closed on Foam Pad  - 1 x daily - 7 x weekly - 2 sets - 30 sec hold - Narrow Stance with Eyes Closed and Head Nods on Foam Pad  - 1 x daily - 7 x weekly - 2 sets - 30 sec hold - Narrow Stance with Eyes Closed and Head Rotation on Foam Pad  - 1 x daily - 7 x weekly - 2 sets - 30 sec hold - Runner's Step Up/Down  - 1 x daily - 7 x weekly - 2 sets - 10 reps    GOALS: Goals reviewed with patient? Yes   LONG TERM GOALS: Target date: 05/06/2022  Pt will be ind with balance HEP Baseline:  Goal status: MET  2.  Pt will demo at least 22/30 on FGA for decreased fall risk Baseline:  Goal status: MET  3.  Pt will have increased Berg Balance Score to at least 54/56 for low fall risk Baseline:  Goal status: MET  4.  Pt will have improved ABC to at least 90% Baseline:  Goal status: MET    ASSESSMENT:  CLINICAL IMPRESSION: Angelica [name] has continued to improve with therapy. She has met all of her LTGs at this time and feels ready for d/c. Greatest deficit is performing heel to toe walking during FGA. Otherwise, all her balance scores have increased and she is now categorized as a low fall risk.     OBJECTIVE  IMPAIRMENTS decreased balance, decreased coordination, and decreased mobility.   ACTIVITY LIMITATIONS carrying, lifting, stairs, and locomotion level  PARTICIPATION LIMITATIONS: cleaning, community activity, and yard work  PERSONAL FACTORS Age, Past/current experiences, and Time since onset of injury/illness/exacerbation are also affecting patient's functional outcome.   REHAB POTENTIAL: Good  CLINICAL DECISION MAKING: Stable/uncomplicated  EVALUATION COMPLEXITY: Low  PLAN: PT FREQUENCY: 1x/week  PT DURATION: 6 weeks  PLANNED INTERVENTIONS: Therapeutic exercises, Therapeutic activity, Neuromuscular re-education, Balance training, Gait training, Patient/Family education, Self Care, Joint mobilization, Stair training, Aquatic Therapy, Electrical stimulation, Cryotherapy, Moist heat, Taping, Ionotophoresis 53m/ml Dexamethasone, Manual therapy, and Re-evaluation  PLAN FOR NEXT SESSION: Continue progressing balance. Work primarily on dynamic balance, single leg stance and multi directional stepping   Harrison Zetina April MGordy Levan PT, DPT 05/09/2022, 9:41 AM

## 2022-05-15 ENCOUNTER — Ambulatory Visit (INDEPENDENT_AMBULATORY_CARE_PROVIDER_SITE_OTHER): Payer: Medicare Other | Admitting: Physician Assistant

## 2022-05-15 ENCOUNTER — Encounter: Payer: Self-pay | Admitting: Physician Assistant

## 2022-05-15 VITALS — BP 130/56 | HR 70 | Ht 63.0 in | Wt 167.0 lb

## 2022-05-15 DIAGNOSIS — Z780 Asymptomatic menopausal state: Secondary | ICD-10-CM

## 2022-05-15 DIAGNOSIS — E890 Postprocedural hypothyroidism: Secondary | ICD-10-CM | POA: Diagnosis not present

## 2022-05-15 DIAGNOSIS — E559 Vitamin D deficiency, unspecified: Secondary | ICD-10-CM | POA: Diagnosis not present

## 2022-05-15 DIAGNOSIS — R0683 Snoring: Secondary | ICD-10-CM

## 2022-05-15 DIAGNOSIS — G478 Other sleep disorders: Secondary | ICD-10-CM | POA: Diagnosis not present

## 2022-05-15 DIAGNOSIS — E039 Hypothyroidism, unspecified: Secondary | ICD-10-CM

## 2022-05-15 DIAGNOSIS — R5383 Other fatigue: Secondary | ICD-10-CM

## 2022-05-15 MED ORDER — CYANOCOBALAMIN 1000 MCG/ML IJ SOLN
1000.0000 ug | Freq: Once | INTRAMUSCULAR | Status: AC
  Administered 2022-05-15: 1000 ug via INTRAMUSCULAR

## 2022-05-15 NOTE — Progress Notes (Signed)
Established Patient Office Visit  Subjective   Patient ID: Angelica [name], female    DOB: [birth year]  Age: 71 y.o. MRN: [id number]  Chief Complaint  Patient presents with   Follow-up    HPI Pt is a 71 yo female with hypothyroidism, IDA, GAD, HLD, HTN who presents to the clinic for follow up. Last labs showed TSH too high and synthroid was increased. Pt continues to complain of fatigue. She wakes up tired. She has to take a nap during the day. No SI/HC. She does not feel depressed. No cough or URI symptoms.    .. Active Ambulatory Problems    Diagnosis Date Noted   Breast cancer ([place]) 01/31/2011   GAD (generalized anxiety disorder) 01/31/2011   Asthma 01/31/2011   Seasonal allergies 01/31/2011   Hypothyroid 01/31/2011   Episodic mood disorder ([place]) 10/17/2012   Anxiety and depression 08/03/2013   Chondromalacia of right patellofemoral joint 02/10/2014   Insomnia 03/15/2014   IBS (irritable bowel syndrome) 08/22/2014   Dyslipidemia (high LDL; low HDL) 03/09/2015   Elevated blood pressure 04/26/2015   Trapezius muscle spasm 09/28/2015   Epistaxis 11/14/2015   Essential hypertension 06/11/2016   Cellulitis and abscess of buttock 06/04/2017   Calculus of gallbladder 10/21/2017   Elevated lipase 10/21/2017   Diarrhea 10/21/2017   Upper abdominal pain 10/21/2017   Left-sided nosebleed 03/02/2018   TMJ pain dysfunction syndrome 03/02/2018   Colon polyps 03/31/2018   Neck fullness 08/31/2018   Rotator cuff syndrome, left 05/26/2019   Injection site reaction 04/04/2020   Osteopenia 05/10/2021   Personal history of breast cancer 10/15/2021   Diverticular disease of colon 10/15/2021   Hirsutism 10/19/2021   Night sweats 10/19/2021   Primary osteoarthritis of left knee 12/19/2021   Fall 03/08/2022   Snoring 05/15/2022   Non-restorative sleep 05/15/2022   No energy 05/15/2022   Post-menopausal 05/15/2022   Resolved Ambulatory Problems    Diagnosis Date Noted   OAB  (overactive bladder) 03/08/2015   Vitamin D deficiency 03/09/2015   Impingement syndrome of left shoulder 08/28/2015   Acute upper respiratory infection 12/15/2015   Past Medical History:  Diagnosis Date   Allergy    Anxiety    Asthma    Depression    Dyspnea    Hypertension    Hypothyroidism    Postmenopausal    Thyroid disease      ROS   See HPI.  Objective:     BP (!) 130/56   Pulse 70   Ht '5\' 3"'$  (1.6 m)   Wt 167 lb (75.8 kg)   LMP 06/24/2000   SpO2 98%   BMI 29.58 kg/m  BP Readings from Last 3 Encounters:  05/15/22 (!) 130/56  04/12/22 (!) 130/59  03/15/22 130/61   Wt Readings from Last 3 Encounters:  05/15/22 167 lb (75.8 kg)  04/12/22 167 lb (75.8 kg)  03/15/22 168 lb (76.2 kg)      Physical Exam Constitutional:      Appearance: Normal appearance.  Cardiovascular:     Rate and Rhythm: Normal rate and regular rhythm.  Pulmonary:     Effort: Pulmonary effort is normal.  Neurological:     General: No focal deficit present.     Mental Status: She is alert and oriented to person, place, and time.  Psychiatric:        Mood and Affect: Mood normal.        Assessment & Plan:  Marland KitchenMarland KitchenRejeana was seen today for follow-up.  Diagnoses and all orders for this visit:  Acquired hypothyroidism -     TSH  Snoring -     Home sleep test  Non-restorative sleep -     Home sleep test  No energy -     B12 and Folate Panel -     VITAMIN D 25 Hydroxy (Vit-D Deficiency, Fractures) -     CBC w/Diff/Platelet -     TSH -     Home sleep test -     cyanocobalamin (VITAMIN B12) injection 1,000 mcg  Post-menopausal -     B12 and Folate Panel -     VITAMIN D 25 Hydroxy (Vit-D Deficiency, Fractures) -     CBC w/Diff/Platelet -     TSH   Labs to recheck and adjust thyroid medication and to evaluate labs for reasons for fatigue Hx of low B12 B12 shot given in office today With fatigue/overweigh/snoring/non restorative sleep will get sleep study Follow up in 3  months  Iran Planas, PA-C

## 2022-05-16 LAB — CBC WITH DIFFERENTIAL/PLATELET
Absolute Monocytes: 461 cells/uL (ref 200–950)
Basophils Absolute: 72 cells/uL (ref 0–200)
Basophils Relative: 1 %
Eosinophils Absolute: 187 cells/uL (ref 15–500)
Eosinophils Relative: 2.6 %
HCT: 37.8 % (ref 35.0–45.0)
Hemoglobin: 12.8 g/dL (ref 11.7–15.5)
Lymphs Abs: 3010 cells/uL (ref 850–3900)
MCH: 29.2 pg (ref 27.0–33.0)
MCHC: 33.9 g/dL (ref 32.0–36.0)
MCV: 86.3 fL (ref 80.0–100.0)
MPV: 11.9 fL (ref 7.5–12.5)
Monocytes Relative: 6.4 %
Neutro Abs: 3470 cells/uL (ref 1500–7800)
Neutrophils Relative %: 48.2 %
Platelets: 220 10*3/uL (ref 140–400)
RBC: 4.38 10*6/uL (ref 3.80–5.10)
RDW: 13 % (ref 11.0–15.0)
Total Lymphocyte: 41.8 %
WBC: 7.2 10*3/uL (ref 3.8–10.8)

## 2022-05-16 LAB — B12 AND FOLATE PANEL
Folate: 9.7 ng/mL
Vitamin B-12: >2000 pg/mL — ABNORMAL HIGH (ref 200–1100)

## 2022-05-16 LAB — TSH: TSH: 1.62 mIU/L (ref 0.40–4.50)

## 2022-05-16 LAB — VITAMIN D 25 HYDROXY (VIT D DEFICIENCY, FRACTURES): Vit D, 25-Hydroxy: 15 ng/mL — ABNORMAL LOW (ref 30–100)

## 2022-05-20 ENCOUNTER — Encounter: Payer: Self-pay | Admitting: Physician Assistant

## 2022-05-20 NOTE — Progress Notes (Signed)
How did B12 shot make you feel?  B12 looks great in labs and even on the high side.  Thyroid looks great.  Kidney, liver, glucose look good.  Normal hemoglobin.  Vitamin D is low. How much are you taking? Are you taking with dairy or fatty food.

## 2022-06-05 ENCOUNTER — Other Ambulatory Visit: Payer: Self-pay | Admitting: Physician Assistant

## 2022-06-05 DIAGNOSIS — E039 Hypothyroidism, unspecified: Secondary | ICD-10-CM

## 2022-06-06 ENCOUNTER — Ambulatory Visit
Admission: RE | Admit: 2022-06-06 | Discharge: 2022-06-06 | Disposition: A | Discharge status: Home / Self Care | Payer: Medicare Other | Source: Ambulatory Visit | Attending: Physician Assistant | Admitting: Physician Assistant

## 2022-06-06 DIAGNOSIS — Z1231 Encounter for screening mammogram for malignant neoplasm of breast: Secondary | ICD-10-CM

## 2022-06-10 NOTE — Progress Notes (Signed)
Normal mammogram. Follow up in 1 year.

## 2022-06-21 DIAGNOSIS — E039 Hypothyroidism, unspecified: Secondary | ICD-10-CM | POA: Diagnosis not present

## 2022-06-22 LAB — TSH: TSH: 0.53 mIU/L (ref 0.40–4.50)

## 2022-06-25 ENCOUNTER — Encounter: Payer: Self-pay | Admitting: Physician Assistant

## 2022-06-25 DIAGNOSIS — E039 Hypothyroidism, unspecified: Secondary | ICD-10-CM

## 2022-06-25 MED ORDER — LEVOTHYROXINE SODIUM 88 MCG PO TABS: 88.0000 ug | ORAL_TABLET | Freq: Every day | ORAL | 3 refills | Status: DC

## 2022-06-25 NOTE — Progress Notes (Signed)
TSH is closer to HYPER thyroid side than HYPO so not causing fatigue. Still in normal range. Do you need refills of medication?

## 2022-06-25 NOTE — Telephone Encounter (Signed)
Levothyroxine sent.   I assume she is asking for Clonazepam.  Last written 04/12/2022 #30 with 5 refills. Too soon to fill.

## 2022-08-16 ENCOUNTER — Encounter: Payer: Self-pay | Admitting: Physician Assistant

## 2022-08-16 ENCOUNTER — Ambulatory Visit (INDEPENDENT_AMBULATORY_CARE_PROVIDER_SITE_OTHER): Payer: Medicare Other | Admitting: Physician Assistant

## 2022-08-16 VITALS — BP 138/72 | HR 65 | Ht 63.0 in | Wt 166.1 lb

## 2022-08-16 DIAGNOSIS — K580 Irritable bowel syndrome with diarrhea: Secondary | ICD-10-CM | POA: Diagnosis not present

## 2022-08-16 DIAGNOSIS — I10 Essential (primary) hypertension: Secondary | ICD-10-CM

## 2022-08-16 DIAGNOSIS — F39 Unspecified mood [affective] disorder: Secondary | ICD-10-CM

## 2022-08-16 DIAGNOSIS — E039 Hypothyroidism, unspecified: Secondary | ICD-10-CM

## 2022-08-16 DIAGNOSIS — E785 Hyperlipidemia, unspecified: Secondary | ICD-10-CM

## 2022-08-16 DIAGNOSIS — F419 Anxiety disorder, unspecified: Secondary | ICD-10-CM | POA: Diagnosis not present

## 2022-08-16 DIAGNOSIS — F32A Depression, unspecified: Secondary | ICD-10-CM | POA: Diagnosis not present

## 2022-08-16 DIAGNOSIS — Z23 Encounter for immunization: Secondary | ICD-10-CM | POA: Diagnosis not present

## 2022-08-16 MED ORDER — LAMOTRIGINE 100 MG PO TABS: 100.0000 mg | ORAL_TABLET | Freq: Two times a day (BID) | ORAL | 1 refills | Status: DC

## 2022-08-16 MED ORDER — ESCITALOPRAM OXALATE 20 MG PO TABS: ORAL_TABLET | ORAL | 1 refills | Status: DC

## 2022-08-16 MED ORDER — BUSPIRONE HCL 7.5 MG PO TABS: 7.5000 mg | ORAL_TABLET | Freq: Two times a day (BID) | ORAL | 2 refills | Status: DC

## 2022-08-16 MED ORDER — DICYCLOMINE HCL 10 MG PO CAPS: 10.0000 mg | ORAL_CAPSULE | Freq: Two times a day (BID) | ORAL | 1 refills | Status: DC

## 2022-08-16 MED ORDER — HYDROCHLOROTHIAZIDE 12.5 MG PO TABS: 12.5000 mg | ORAL_TABLET | Freq: Every day | ORAL | 1 refills | Status: DC

## 2022-08-16 NOTE — Progress Notes (Signed)
Established Patient Office Visit  Subjective   Patient ID: Angelica [name], female    DOB: March 13, 1951  Age: 72 y.o. MRN: [id number]  Chief Complaint  Patient presents with   Hypertension   mood    HPI Pt is a 72 yo female with HTN, GAD, Asthma, Mood disorder, Insomnia, Dyslipidemia, hypothyroidism who presents to the clinic for follow up.   Pt was never called about sleep study. Her energy is much better now and she is snoring less. She does not want to get it done at this point.   Pt denies any CP, palpitations, headaches, vision changes.   Pt is having some increase in anxiety. Depression is control. She just feels "uneasy" at times "inside". She wonders if she needs a medication adjustment. She is taking lexapro and lamictal.   IBS symptoms doing well with bentyl.     Active Ambulatory Problems    Diagnosis Date Noted   Breast cancer ([place]) 01/31/2011   GAD (generalized anxiety disorder) 01/31/2011   Asthma 01/31/2011   Seasonal allergies 01/31/2011   Hypothyroid 01/31/2011   Episodic mood disorder ([place]) 10/17/2012   Anxiety and depression 08/03/2013   Chondromalacia of right patellofemoral joint 02/10/2014   Insomnia 03/15/2014   IBS (irritable bowel syndrome) 08/22/2014   Dyslipidemia (high LDL; low HDL) 03/09/2015   Elevated blood pressure 04/26/2015   Trapezius muscle spasm 09/28/2015   Epistaxis 11/14/2015   Essential hypertension 06/11/2016   Cellulitis and abscess of buttock 06/04/2017   Calculus of gallbladder 10/21/2017   Elevated lipase 10/21/2017   Diarrhea 10/21/2017   Upper abdominal pain 10/21/2017   Left-sided nosebleed 03/02/2018   TMJ pain dysfunction syndrome 03/02/2018   Colon polyps 03/31/2018   Neck fullness 08/31/2018   Rotator cuff syndrome, left 05/26/2019   Injection site reaction 04/04/2020   Osteopenia 05/10/2021   Personal history of breast cancer 10/15/2021   Diverticular disease of colon 10/15/2021   Hirsutism 10/19/2021   Night  sweats 10/19/2021   Primary osteoarthritis of left knee 12/19/2021   Fall 03/08/2022   Snoring 05/15/2022   Non-restorative sleep 05/15/2022   No energy 05/15/2022   Post-menopausal 05/15/2022   Resolved Ambulatory Problems    Diagnosis Date Noted   OAB (overactive bladder) 03/08/2015   Vitamin D deficiency 03/09/2015   Impingement syndrome of left shoulder 08/28/2015   Acute upper respiratory infection 12/15/2015   Past Medical History:  Diagnosis Date   Allergy    Anxiety    Asthma    Depression    Dyspnea    Hypertension    Hypothyroidism    Postmenopausal    Thyroid disease        ROS   See HPI.  Objective:     BP (!) 159/63 (BP Location: Left Arm, Patient Position: Sitting, Cuff Size: Small)   Pulse 65   Ht '5\' 3"'$  (1.6 m)   Wt 166 lb 1.3 oz (75.3 kg)   LMP 06/24/2000   SpO2 98%   BMI 29.42 kg/m  BP Readings from Last 3 Encounters:  08/16/22 (!) 159/63  05/15/22 (!) 130/56  04/12/22 (!) 130/59   Wt Readings from Last 3 Encounters:  08/16/22 166 lb 1.3 oz (75.3 kg)  05/15/22 167 lb (75.8 kg)  04/12/22 167 lb (75.8 kg)   ..    08/16/2022   10:18 AM 04/15/2022    9:17 AM 04/12/2022    9:53 AM 10/16/2021    2:44 PM 04/13/2021   11:04 AM  Depression  screen PHQ 2/9  Decreased Interest 0 '1 1 1 '$ 0  Down, Depressed, Hopeless 0 '1 1 1 '$ 0  PHQ - 2 Score 0 '2 2 2 '$ 0  Altered sleeping 0 '1 1 2   '$ Tired, decreased energy 0 '1 1 2   '$ Change in appetite '1 3 3 '$ 0   Feeling bad or failure about yourself  0 '2 2 3   '$ Trouble concentrating 0 '1 1 2   '$ Moving slowly or fidgety/restless 0 '1 1 2   '$ Suicidal thoughts 0 0 0 0   PHQ-9 Score '1 11 11 13   '$ Difficult doing work/chores Somewhat difficult Somewhat difficult Somewhat difficult Somewhat difficult    ..    08/16/2022   10:19 AM 04/12/2022    9:53 AM 10/16/2021    2:45 PM 10/16/2020   11:02 AM  GAD 7 : Generalized Anxiety Score  Nervous, Anxious, on Edge 2 1 0 2  Control/stop worrying '2 1 2 2  '$ Worry too much -  different things '2 2 2 2  '$ Trouble relaxing '2 1 2 2  '$ Restless '2 1 1 2  '$ Easily annoyed or irritable '3 2 2 3  '$ Afraid - awful might happen '3 1 1 3  '$ Total GAD 7 Score '16 9 10 16  '$ Anxiety Difficulty Somewhat difficult Somewhat difficult Somewhat difficult Very difficult      Physical Exam    The 10-year ASCVD risk score (Arnett DK, et al., 2019) is: 20.7%    Assessment & Plan:  Marland KitchenMarland KitchenYalissa was seen today for hypertension and mood.  Diagnoses and all orders for this visit:  Acquired hypothyroidism  Essential hypertension -     hydrochlorothiazide (HYDRODIURIL) 12.5 MG tablet; Take 1 tablet (12.5 mg total) by mouth daily.  Anxiety and depression -     busPIRone (BUSPAR) 7.5 MG tablet; Take 1 tablet (7.5 mg total) by mouth 2 (two) times daily. -     lamoTRIgine (LAMICTAL) 100 MG tablet; Take 1 tablet (100 mg total) by mouth 2 (two) times daily. -     escitalopram (LEXAPRO) 20 MG tablet; Take 1 tablet by mouth once daily  Dyslipidemia (high LDL; low HDL)  Encounter for immunization [employer] Fall 2023 Covid-19 Vaccine 57yr and older  Episodic mood disorder ([facility]) -     busPIRone (BUSPAR) 7.5 MG tablet; Take 1 tablet (7.5 mg total) by mouth 2 (two) times daily. -     lamoTRIgine (LAMICTAL) 100 MG tablet; Take 1 tablet (100 mg total) by mouth 2 (two) times daily.  Irritable bowel syndrome with diarrhea -     dicyclomine (BENTYL) 10 MG capsule; Take 1 capsule (10 mg total) by mouth 2 (two) times daily.   IBS controlled with bentyl.  GAD not to goal added buspar for anxiety BP not to goal but home readings are 130s over 70s, continue same medications.  Follow up in 6 months.  Covid booster given today.    JIran Planas PA-C

## 2022-08-26 ENCOUNTER — Encounter: Payer: Self-pay | Admitting: Physician Assistant

## 2022-09-05 ENCOUNTER — Encounter: Payer: Self-pay | Admitting: Physician Assistant

## 2022-09-06 MED ORDER — BUSPIRONE HCL 15 MG PO TABS: 15.0000 mg | ORAL_TABLET | Freq: Two times a day (BID) | ORAL | 2 refills | Status: DC

## 2022-09-16 ENCOUNTER — Encounter: Payer: Self-pay | Admitting: Physician Assistant

## 2022-10-01 ENCOUNTER — Other Ambulatory Visit: Payer: Self-pay | Admitting: Pharmacist

## 2022-10-01 ENCOUNTER — Other Ambulatory Visit: Payer: Self-pay | Admitting: Physician Assistant

## 2022-10-01 ENCOUNTER — Encounter: Payer: Self-pay | Admitting: Physician Assistant

## 2022-10-01 DIAGNOSIS — I10 Essential (primary) hypertension: Secondary | ICD-10-CM

## 2022-10-01 NOTE — Telephone Encounter (Signed)
Called pharmacy and obtained vaccine details added to patient chart.

## 2022-10-01 NOTE — Progress Notes (Signed)
10/01/2022 Name: Angelica [name] MRN: [id number] DOB: March 31, 1951   Angelica [name] is a 72 y.o. year old female who presented for a telephone visit.   They were referred to the pharmacist by a quality report for assistance in managing  quality metrics: med adherence cholesterol (MAC) and med adherence hypertension [facility]) .   Subjective:  Care Team: Primary Care Provider: Nolene Ebbs ; Next Scheduled Visit: 10/15/22  Medication Access/Adherence  Current Pharmacy:  [id number] - [place], [place] - [address] [place] [address] Phone: [phone] Fax: [phone]   Patient reports affordability concerns with their medications: No  Patient reports access/transportation concerns to their pharmacy: No  Patient reports adherence concerns with their medications:  No     Hypertension:  Current medications: lisinopril 40mg  daily, hctz 12.5mg  daily   Patient has a validated, automated, upper arm home BP cuff Current blood pressure readings readings: 130/70s    Hyperlipidemia/ASCVD Risk Reduction  Current lipid lowering medications: atorvastatin 40mg  daily   Objective:  Lab Results  Component Value Date   HGBA1C 5.2 01/31/2011    Lab Results  Component Value Date   CREATININE 0.90 12/08/2021   BUN 11 12/08/2021   NA 140 12/08/2021   K 3.7 12/08/2021   CL 105 12/08/2021   CO2 29 12/08/2021    Lab Results  Component Value Date   CHOL 171 04/12/2022   HDL 44 (L) 04/12/2022   LDLCALC 102 (H) 04/12/2022   LDLDIRECT 94 01/31/2011   TRIG 151 (H) 04/12/2022   CHOLHDL 3.9 04/12/2022    Medications Reviewed Today     Reviewed by Gabriel Carina, RPH (Pharmacist) on 10/01/22 at 0944  Med List Status: <None>   Medication Order Taking? Sig Documenting Provider Last Dose Status Informant  atorvastatin (LIPITOR) 40 MG tablet [id number] Yes Take 1 tablet (40 mg total) by mouth daily. Jomarie Longs,  PA-C Taking Active   busPIRone (BUSPAR) 15 MG tablet [id number] Yes Take 1 tablet (15 mg total) by mouth 2 (two) times daily.  Patient taking differently: Take 30 mg by mouth 2 (two) times daily.   Jomarie Longs, PA-C Taking Active   clonazePAM (KLONOPIN) 0.5 MG tablet [id number] Yes Take one tablet as needed for anxiety no more than once a day. Jomarie Longs, PA-C Taking Active   dicyclomine (BENTYL) 10 MG capsule [id number] Yes Take 1 capsule (10 mg total) by mouth 2 (two) times daily. Jomarie Longs, PA-C Taking Active   escitalopram (LEXAPRO) 20 MG tablet [id number] Yes Take 1 tablet by mouth once daily Breeback, [name] L, PA-C Taking Active   fluticasone (FLONASE) 50 MCG/ACT nasal spray [id number] Yes Use 2 spray(s) in each nostril once daily Breeback, [name] L, PA-C Taking Active   hydrochlorothiazide (HYDRODIURIL) 12.5 MG tablet [id number] Yes Take 1 tablet (12.5 mg total) by mouth daily. Jomarie Longs, PA-C Taking Active   lamoTRIgine (LAMICTAL) 100 MG tablet [id number] Yes Take 1 tablet (100 mg total) by mouth 2 (two) times daily. Jomarie Longs, PA-C Taking Active   levothyroxine (SYNTHROID) 88 MCG tablet [id number]  Take 1 tablet (88 mcg total) by mouth daily. Breeback, [name] L, PA-C  Active   lisinopril (ZESTRIL) 40 MG tablet [id number] Yes Take 40 mg by mouth daily. [provider] Taking Active   Med List Note Nolene Ebbs 01/04/20 0938):  n        n                         Assessment/Plan:   Hypertension: - Currently controlled. Reviewed medication fill history and confirmed usage with patient, all is going well.  - Recommend to continue current regimen    Hyperlipidemia/ASCVD Risk Reduction: - Currently controlled. Reviewed medication fill history and confirmed usage with patient, all is going well.  - Recommend to continue current regimen   Angelica [name], PharmD, BCPS Clinical Pharmacist [facility]

## 2022-10-01 NOTE — Progress Notes (Signed)
Patient appearing on report for quality metrics.  Outreached patient to discuss medication management. Left voicemail for patient to return my call at their convenience.   Demario Faniel, PharmD, BCPS Clinical Pharmacist [facility]  

## 2022-10-01 NOTE — Patient Instructions (Signed)
Aanaya,  Thank you for speaking with me today! We have reviewed your medication list, and all looks to be going well. Keep doing what you are doing!   Take care, Lynnda Shields  Lynnda Shields, PharmD, BCPS Clinical Pharmacist [facility]

## 2022-10-04 NOTE — Telephone Encounter (Signed)
Rx written by historical provider. 

## 2022-10-14 ENCOUNTER — Ambulatory Visit: Payer: Medicare Other | Admitting: Physician Assistant

## 2022-10-15 ENCOUNTER — Ambulatory Visit (INDEPENDENT_AMBULATORY_CARE_PROVIDER_SITE_OTHER): Payer: Medicare Other | Admitting: Physician Assistant

## 2022-10-15 VITALS — BP 128/82 | HR 81 | Ht 63.0 in | Wt 160.0 lb

## 2022-10-15 DIAGNOSIS — F419 Anxiety disorder, unspecified: Secondary | ICD-10-CM | POA: Diagnosis not present

## 2022-10-15 DIAGNOSIS — F32A Depression, unspecified: Secondary | ICD-10-CM

## 2022-10-15 DIAGNOSIS — F439 Reaction to severe stress, unspecified: Secondary | ICD-10-CM

## 2022-10-15 MED ORDER — BUSPIRONE HCL 30 MG PO TABS: 30.0000 mg | ORAL_TABLET | Freq: Two times a day (BID) | ORAL | 1 refills | Status: DC

## 2022-10-15 MED ORDER — ARIPIPRAZOLE 2 MG PO TABS: 2.0000 mg | ORAL_TABLET | Freq: Every day | ORAL | 0 refills | Status: DC

## 2022-10-15 NOTE — Progress Notes (Signed)
Established Patient Office Visit  Subjective   Patient ID: Angelica [name], female    DOB: 1951-06-02  Age: 72 y.o. MRN: [id number]  Chief Complaint  Patient presents with   Follow-up    HPI Pt is a 72 yo female who presents to the clinic to follow up on anxiety and depression. Her home stress level continues to be high due to her daughter not wanting to get help. Her daughter continues to smoke pot and drink and not work. Pt has kicked her out of the house but she worries about her. She continues to be down and wonders if medication can be adjusted. The buspar increase did help with anxiety. She does not want to do counseling.   No SI/HC.   [name]. Active Ambulatory Problems    Diagnosis Date Noted   Breast cancer 01/31/2011   GAD (generalized anxiety disorder) 01/31/2011   Asthma 01/31/2011   Seasonal allergies 01/31/2011   Hypothyroid 01/31/2011   Episodic mood disorder 10/17/2012   Anxiety and depression 08/03/2013   Chondromalacia of right patellofemoral joint 02/10/2014   Insomnia 03/15/2014   IBS (irritable bowel syndrome) 08/22/2014   Dyslipidemia (high LDL; low HDL) 03/09/2015   Elevated blood pressure 04/26/2015   Trapezius muscle spasm 09/28/2015   Epistaxis 11/14/2015   Essential hypertension 06/11/2016   Cellulitis and abscess of buttock 06/04/2017   Calculus of gallbladder 10/21/2017   Elevated lipase 10/21/2017   Diarrhea 10/21/2017   Upper abdominal pain 10/21/2017   Left-sided nosebleed 03/02/2018   TMJ pain dysfunction syndrome 03/02/2018   Colon polyps 03/31/2018   Neck fullness 08/31/2018   Rotator cuff syndrome, left 05/26/2019   Injection site reaction 04/04/2020   Osteopenia 05/10/2021   Personal history of breast cancer 10/15/2021   Diverticular disease of colon 10/15/2021   Hirsutism 10/19/2021   Night sweats 10/19/2021   Primary osteoarthritis of left knee 12/19/2021   Fall 03/08/2022   Snoring 05/15/2022   Non-restorative sleep 05/15/2022    No energy 05/15/2022   Post-menopausal 05/15/2022   Stress at home 10/15/2022   Resolved Ambulatory Problems    Diagnosis Date Noted   OAB (overactive bladder) 03/08/2015   Vitamin D deficiency 03/09/2015   Impingement syndrome of left shoulder 08/28/2015   Acute upper respiratory infection 12/15/2015   Past Medical History:  Diagnosis Date   Allergy    Anxiety    Asthma    Depression    Dyspnea    Hypertension    Hypothyroidism    Postmenopausal    Thyroid disease      ROS See HPI.    Objective:     BP 128/82   Pulse 81   Ht  (1.6 m)   Wt 160 lb (72.6 kg)   LMP 06/24/2000   SpO2 99%   BMI 28.34 kg/m  BP Readings from Last 3 Encounters:  10/15/22 128/82  08/16/22 138/72  05/15/22 (!) 130/56   Wt Readings from Last 3 Encounters:  10/15/22 160 lb (72.6 kg)  08/16/22 166 lb 1.3 oz (75.3 kg)  05/15/22 167 lb (75.8 kg)    ..    10/15/2022   10:11 AM 08/16/2022   10:18 AM 04/15/2022    9:17 AM 04/12/2022    9:53 AM 10/16/2021    2:44 PM  Depression screen PHQ 2/9  Decreased Interest 2 0 Down, Depressed, Hopeless 2 0 PHQ - 2 Score 4 0 Altered  sleeping 1 0 Tired, decreased energy 1 0 Change in appetite 0  Feeling bad or failure about yourself  1 0 Trouble concentrating 1 0 Moving slowly or fidgety/restless 1 0 Suicidal thoughts 0 0 0 0 0  PHQ-9 Score Difficult doing work/chores Somewhat difficult Somewhat difficult Somewhat difficult Somewhat difficult Somewhat difficult   ..    10/15/2022   10:11 AM 08/16/2022   10:19 AM 04/12/2022    9:53 AM 10/16/2021    2:45 PM  GAD 7 : Generalized Anxiety Score  Nervous, Anxious, on Edge 0  Control/stop worrying Worry too much - different things Trouble relaxing Restless Easily annoyed or irritable Afraid - awful might happen Total GAD 7 Score Anxiety  Difficulty Somewhat difficult Somewhat difficult Somewhat difficult Somewhat difficult      Physical Exam Constitutional:      Appearance: Normal appearance.  HENT:     Head: Normocephalic.  Cardiovascular:     Rate and Rhythm: Normal rate.  Pulmonary:     Effort: Pulmonary effort is normal.  Neurological:     General: No focal deficit present.     Mental Status: She is alert and oriented to person, place, and time.  Psychiatric:        Mood and Affect: Mood normal.        The 10-year ASCVD risk score (Arnett DK, et al., 2019) is: 15.4%    Assessment & Plan:  Marland KitchenMarland KitchenAnneliese was seen today for follow-up.  Diagnoses and all orders for this visit:  Anxiety and depression -     busPIRone (BUSPAR) 30 MG tablet; Take 1 tablet (30 mg total) by mouth in the morning and at bedtime. -     ARIPiprazole (ABILIFY) 2 MG tablet; Take 1 tablet (2 mg total) by mouth daily.  Stress at home -     busPIRone (BUSPAR) 30 MG tablet; Take 1 tablet (30 mg total) by mouth in the morning and at bedtime.   PHQ worsened and GAD improved Continue on lexapro, lamictal, buspar Added abilify  Discussed side effects Declined counseling today Discussed coping skills 2nd BP recheck great Follow up in 4 weeks   Return in about 4 weeks (around 11/12/2022).   Spent 30 minutes in chart review and with patient discussing coping skills and medication changes.    Tandy Gaw, PA-C

## 2022-10-20 ENCOUNTER — Encounter (HOSPITAL_BASED_OUTPATIENT_CLINIC_OR_DEPARTMENT_OTHER): Payer: Self-pay | Admitting: Emergency Medicine

## 2022-10-20 ENCOUNTER — Emergency Department (HOSPITAL_BASED_OUTPATIENT_CLINIC_OR_DEPARTMENT_OTHER): Payer: Medicare Other

## 2022-10-20 ENCOUNTER — Emergency Department (HOSPITAL_BASED_OUTPATIENT_CLINIC_OR_DEPARTMENT_OTHER)
Admission: EM | Admit: 2022-10-20 | Discharge: 2022-10-20 | Disposition: A | Discharge status: Home / Self Care | Payer: Medicare Other | Attending: Emergency Medicine | Admitting: Emergency Medicine

## 2022-10-20 DIAGNOSIS — I10 Essential (primary) hypertension: Secondary | ICD-10-CM | POA: Insufficient documentation

## 2022-10-20 DIAGNOSIS — J4541 Moderate persistent asthma with (acute) exacerbation: Secondary | ICD-10-CM | POA: Diagnosis not present

## 2022-10-20 DIAGNOSIS — R0602 Shortness of breath: Secondary | ICD-10-CM | POA: Diagnosis not present

## 2022-10-20 MED ORDER — ALBUTEROL SULFATE HFA 108 (90 BASE) MCG/ACT IN AERS
2.0000 | INHALATION_SPRAY | Freq: Once | RESPIRATORY_TRACT | Status: AC
  Administered 2022-10-20: 2 via RESPIRATORY_TRACT
  Filled 2022-10-20: qty 6.7

## 2022-10-20 MED ORDER — ALBUTEROL SULFATE (2.5 MG/3ML) 0.083% IN NEBU
INHALATION_SOLUTION | RESPIRATORY_TRACT | Status: AC
  Filled 2022-10-20: qty 6

## 2022-10-20 MED ORDER — ALBUTEROL SULFATE HFA 108 (90 BASE) MCG/ACT IN AERS
2.0000 | INHALATION_SPRAY | RESPIRATORY_TRACT | Status: DC | PRN
  Administered 2022-10-20: 2 via RESPIRATORY_TRACT

## 2022-10-20 MED ORDER — IPRATROPIUM-ALBUTEROL 0.5-2.5 (3) MG/3ML IN SOLN
3.0000 mL | RESPIRATORY_TRACT | Status: AC
  Administered 2022-10-20: 3 mL via RESPIRATORY_TRACT

## 2022-10-20 MED ORDER — ALBUTEROL SULFATE HFA 108 (90 BASE) MCG/ACT IN AERS
INHALATION_SPRAY | RESPIRATORY_TRACT | Status: AC
  Filled 2022-10-20: qty 6.7

## 2022-10-20 MED ORDER — PREDNISONE 10 MG PO TABS: 20.0000 mg | ORAL_TABLET | Freq: Two times a day (BID) | ORAL | 0 refills | Status: DC

## 2022-10-20 MED ORDER — IPRATROPIUM-ALBUTEROL 0.5-2.5 (3) MG/3ML IN SOLN
RESPIRATORY_TRACT | Status: AC
  Filled 2022-10-20: qty 3

## 2022-10-20 MED ORDER — MAGNESIUM SULFATE 2 GM/50ML IV SOLN
2.0000 g | Freq: Once | INTRAVENOUS | Status: AC
  Administered 2022-10-20: 2 g via INTRAVENOUS
  Filled 2022-10-20: qty 50

## 2022-10-20 MED ORDER — METHYLPREDNISOLONE SODIUM SUCC 125 MG IJ SOLR
125.0000 mg | Freq: Once | INTRAMUSCULAR | Status: AC
  Administered 2022-10-20: 125 mg via INTRAVENOUS
  Filled 2022-10-20: qty 2

## 2022-10-20 MED ORDER — ALBUTEROL SULFATE (2.5 MG/3ML) 0.083% IN NEBU
5.0000 mg | INHALATION_SOLUTION | RESPIRATORY_TRACT | Status: AC
  Administered 2022-10-20: 5 mg via RESPIRATORY_TRACT

## 2022-10-20 MED ORDER — MAGNESIUM SULFATE 50 % IJ SOLN
2.0000 g | Freq: Once | INTRAMUSCULAR | Status: DC
  Filled 2022-10-20: qty 4

## 2022-10-20 NOTE — Discharge Instructions (Signed)
Begin taking prednisone as prescribed.  Use the albuterol inhaler, 2 puffs every 4 hours as needed for wheezing.  Return to the ER symptoms significantly worsen or change.

## 2022-10-20 NOTE — ED Provider Notes (Signed)
[facility] [facility] Provider Note   CSN: [id number] Arrival date & time: 10/20/22  4098     History  Chief Complaint  Patient presents with   Shortness of Breath    Angelica [name] is a 72 y.o. female.  Patient is a 72 year old female with past medical history of asthma, hyperlipidemia, hypertension.  Patient presenting today with complaints of shortness of breath and wheezing.  This started earlier this evening.  She denies any fevers, chills, or cough.  She denies any chest pain or leg swelling.  [place] excessive quantities of pollen.  She recently moved and reports having misplaced her inhaler.  The history is provided by the patient.       Home Medications Prior to Admission medications   Medication Sig Start Date End Date Taking? Authorizing Provider  ARIPiprazole (ABILIFY) 2 MG tablet Take 1 tablet (2 mg total) by mouth daily. 10/15/22   Breeback, [name] L, PA-C  atorvastatin (LIPITOR) 40 MG tablet Take 1 tablet (40 mg total) by mouth daily. 04/12/22   Breeback, [name] L, PA-C  busPIRone (BUSPAR) 30 MG tablet Take 1 tablet (30 mg total) by mouth in the morning and at bedtime. 10/15/22   Breeback, Lonna Cobb, PA-C  clonazePAM (KLONOPIN) 0.5 MG tablet Take one tablet as needed for anxiety no more than once a day. 04/12/22   Breeback, Lonna Cobb, PA-C  dicyclomine (BENTYL) 10 MG capsule Take 1 capsule (10 mg total) by mouth 2 (two) times daily. 08/16/22   Jomarie Longs, PA-C  escitalopram (LEXAPRO) 20 MG tablet Take 1 tablet by mouth once daily 08/16/22   Breeback, [name] L, PA-C  fluticasone (FLONASE) 50 MCG/ACT nasal spray Use 2 spray(s) in each nostril once daily 12/04/21   Breeback, [name] L, PA-C  hydrochlorothiazide (HYDRODIURIL) 12.5 MG tablet Take 1 tablet (12.5 mg total) by mouth daily. 08/16/22   Breeback, Lonna Cobb, PA-C  lamoTRIgine (LAMICTAL) 100 MG tablet Take 1 tablet (100 mg total)  by mouth 2 (two) times daily. 08/16/22   Jomarie Longs, PA-C  levothyroxine (SYNTHROID) 88 MCG tablet Take 1 tablet (88 mcg total) by mouth daily. 06/25/22   Jomarie Longs, PA-C  lisinopril (ZESTRIL) 40 MG tablet Take 1 tablet by mouth once daily 10/04/22   Breeback, [name] L, PA-C      Allergies    Penicillins, Red dye, Sulfa antibiotics, Amitriptyline, Macrodantin, and Nitrofurantoin    Review of Systems   Review of Systems  All other systems reviewed and are negative.   Physical Exam Updated Vital Signs BP (!) 146/57 (BP Location: Left Arm)   Pulse 71   Temp 98.5 F (36.9 C) (Oral)   Resp (!) 28   LMP 06/24/2000   SpO2 95%  Physical Exam Vitals and nursing note reviewed.  Constitutional:      General: She is not in acute distress.    Appearance: She is well-developed. She is not diaphoretic.  HENT:     Head: Normocephalic and atraumatic.  Cardiovascular:     Rate and Rhythm: Normal rate and regular rhythm.     Heart sounds: No murmur heard.    No friction rub. No gallop.  Pulmonary:     Effort: Pulmonary effort is normal. No respiratory distress.     Breath sounds: Examination of the right-middle field reveals wheezing. Examination of the left-middle field reveals wheezing. Wheezing present.  Comments: Patient and perhaps mild respiratory distress. Abdominal:     General: Bowel sounds are normal. There is no distension.     Palpations: Abdomen is soft.     Tenderness: There is no abdominal tenderness.  Musculoskeletal:        General: Normal range of motion.     Cervical back: Normal range of motion and neck supple.  Skin:    General: Skin is warm and dry.  Neurological:     General: No focal deficit present.     Mental Status: She is alert and oriented to person, place, and time.     ED Results / Procedures / Treatments   Labs (all labs ordered are listed, but only abnormal results are displayed) Labs Reviewed - No data to  display  EKG None  Radiology No results found.  Procedures Procedures    Medications Ordered in ED Medications  ipratropium-albuterol (DUONEB) 0.5-2.5 (3) MG/3ML nebulizer solution (has no administration in time range)  albuterol (PROVENTIL) (2.5 MG/3ML) 0.083% nebulizer solution (has no administration in time range)  albuterol (VENTOLIN HFA) 108 (90 Base) MCG/ACT inhaler (has no administration in time range)  albuterol (VENTOLIN HFA) 108 (90 Base) MCG/ACT inhaler 2 puff (has no administration in time range)  magnesium sulfate (IV Push/IM) injection 2 g (has no administration in time range)  methylPREDNISolone sodium succinate (SOLU-MEDROL) 125 mg/2 mL injection 125 mg (has no administration in time range)  ipratropium-albuterol (DUONEB) 0.5-2.5 (3) MG/3ML nebulizer solution 3 mL (3 mLs Nebulization Given 10/20/22 0244)  albuterol (PROVENTIL) (2.5 MG/3ML) 0.083% nebulizer solution 5 mg (5 mg Nebulization Given 10/20/22 0256)    ED Course/ Medical Decision Making/ A&P  Patient with history of asthma presenting with shortness of breath and wheezing.  Patient arrives here in mild respiratory distress, but has normal oxygen saturations.  She is afebrile.  She does have wheezing bilaterally on exam.  Chest x-ray obtained showing no active disease.  She was given DuoNeb, Solu-Medrol, and magnesium and is feeling significantly improved.  Patient will be discharged with prednisone, an albuterol inhaler, and is to follow-up as needed.  Final Clinical Impression(s) / ED Diagnoses Final diagnoses:  None    Rx / DC Orders ED Discharge Orders     None         Geoffery Lyons, MD 10/20/22 [phone]

## 2022-10-20 NOTE — ED Triage Notes (Signed)
Pt states was watching tv and SOB started at 10pm and has gotten worse since. States chest pain due to cough.

## 2022-10-20 NOTE — ED Notes (Signed)
Pt reports breathing easier and less pressure on chest.  MD notified.

## 2022-11-12 ENCOUNTER — Ambulatory Visit (INDEPENDENT_AMBULATORY_CARE_PROVIDER_SITE_OTHER): Payer: Medicare Other | Admitting: Physician Assistant

## 2022-11-12 ENCOUNTER — Encounter: Payer: Self-pay | Admitting: Physician Assistant

## 2022-11-12 VITALS — BP 129/67 | HR 82 | Ht 63.0 in | Wt 155.0 lb

## 2022-11-12 DIAGNOSIS — I10 Essential (primary) hypertension: Secondary | ICD-10-CM

## 2022-11-12 DIAGNOSIS — F419 Anxiety disorder, unspecified: Secondary | ICD-10-CM | POA: Diagnosis not present

## 2022-11-12 DIAGNOSIS — J310 Chronic rhinitis: Secondary | ICD-10-CM

## 2022-11-12 DIAGNOSIS — J4520 Mild intermittent asthma, uncomplicated: Secondary | ICD-10-CM

## 2022-11-12 DIAGNOSIS — F39 Unspecified mood [affective] disorder: Secondary | ICD-10-CM

## 2022-11-12 DIAGNOSIS — F439 Reaction to severe stress, unspecified: Secondary | ICD-10-CM

## 2022-11-12 DIAGNOSIS — F32A Depression, unspecified: Secondary | ICD-10-CM

## 2022-11-12 MED ORDER — FLUTICASONE PROPIONATE 50 MCG/ACT NA SUSP
NASAL | 0 refills | Status: DC
Start: 2022-11-12 — End: 2023-11-27

## 2022-11-12 MED ORDER — LISINOPRIL 40 MG PO TABS
40.0000 mg | ORAL_TABLET | Freq: Every day | ORAL | 0 refills | Status: DC
Start: 2022-11-12 — End: 2023-02-14

## 2022-11-12 MED ORDER — ALBUTEROL SULFATE HFA 108 (90 BASE) MCG/ACT IN AERS
2.00 | INHALATION_SPRAY | Freq: Four times a day (QID) | RESPIRATORY_TRACT | 0 refills | Status: DC | PRN
Start: 2022-11-12 — End: 2022-12-27

## 2022-11-12 MED ORDER — ARIPIPRAZOLE 5 MG PO TABS
5.0000 mg | ORAL_TABLET | Freq: Every day | ORAL | 0 refills | Status: DC
Start: 2022-11-12 — End: 2023-02-14

## 2022-11-12 NOTE — Progress Notes (Signed)
Established Patient Office Visit  Subjective   Patient ID: Angelica [name], female    DOB: [birth year]  Age: 72 y.o. MRN: [id number]  Chief Complaint  Patient presents with   Follow-up    HPI Pt is a 72 yo female with HTN, GAD, MDD, mood disorder who presents to the clinic to follow up on mood.   We added abilify 4 weeks ago. She has had great improvement. No side effects. She does ask to go up to 5mg  of abilify. No SI/HC.   She did have asthma attack and had to go to ER because she did not have rescue inhaler. She needs refills today. Not on allergy medication right now.  .. Active Ambulatory Problems    Diagnosis Date Noted   Breast cancer (HCC) 01/31/2011   GAD (generalized anxiety disorder) 01/31/2011   Asthma 01/31/2011   Seasonal allergies 01/31/2011   Hypothyroid 01/31/2011   Episodic mood disorder (HCC) 10/17/2012   Anxiety and depression 08/03/2013   Chondromalacia of right patellofemoral joint 02/10/2014   Insomnia 03/15/2014   IBS (irritable bowel syndrome) 08/22/2014   Dyslipidemia (high LDL; low HDL) 03/09/2015   Elevated blood pressure 04/26/2015   Trapezius muscle spasm 09/28/2015   Epistaxis 11/14/2015   Essential hypertension 06/11/2016   Cellulitis and abscess of buttock 06/04/2017   Calculus of gallbladder 10/21/2017   Elevated lipase 10/21/2017   Diarrhea 10/21/2017   Upper abdominal pain 10/21/2017   Left-sided nosebleed 03/02/2018   TMJ pain dysfunction syndrome 03/02/2018   Colon polyps 03/31/2018   Neck fullness 08/31/2018   Rotator cuff syndrome, left 05/26/2019   Injection site reaction 04/04/2020   Osteopenia 05/10/2021   Personal history of breast cancer 10/15/2021   Diverticular disease of colon 10/15/2021   Hirsutism 10/19/2021   Night sweats 10/19/2021   Primary osteoarthritis of left knee 12/19/2021   Fall 03/08/2022   Snoring 05/15/2022   Non-restorative sleep 05/15/2022   No energy 05/15/2022   Post-menopausal 05/15/2022    Stress at home 10/15/2022   Resolved Ambulatory Problems    Diagnosis Date Noted   OAB (overactive bladder) 03/08/2015   Vitamin D deficiency 03/09/2015   Impingement syndrome of left shoulder 08/28/2015   Acute upper respiratory infection 12/15/2015   Past Medical History:  Diagnosis Date   Allergy    Anxiety    Asthma    Depression    Dyspnea    Hypertension    Hypothyroidism    Postmenopausal    Thyroid disease      ROS See HPI.    Objective:     BP 129/67   Pulse 82   Ht 5\' 3"  (1.6 m)   Wt 155 lb (70.3 kg)   LMP 06/24/2000   SpO2 99%   BMI 27.46 kg/m  BP Readings from Last 3 Encounters:  11/12/22 129/67  10/20/22 (!) 119/50  10/15/22 128/82   Wt Readings from Last 3 Encounters:  11/12/22 155 lb (70.3 kg)  10/15/22 160 lb (72.6 kg)  08/16/22 166 lb 1.3 oz (75.3 kg)      ..    11/12/2022   11:07 AM 10/15/2022   10:11 AM 08/16/2022   10:18 AM 04/15/2022    9:17 AM 04/12/2022    9:53 AM  Depression screen PHQ 2/9  Decreased Interest 0 2 0 1 1  Down, Depressed, Hopeless 0 2 0 1 1  PHQ - 2 Score 0 4 0 2 2  Altered sleeping 0 1 0 1 1  Tired, decreased energy 1 1 0 1 1  Change in appetite 0 1 1 3 3   Feeling bad or failure about yourself  0 1 0 2 2  Trouble concentrating 0 1 0 1 1  Moving slowly or fidgety/restless 0 1 0 1 1  Suicidal thoughts 0 0 0 0 0  PHQ-9 Score 1 10 1 11 11   Difficult doing work/chores Not difficult at all Somewhat difficult Somewhat difficult Somewhat difficult Somewhat difficult   ..    11/12/2022   11:08 AM 10/15/2022   10:11 AM 08/16/2022   10:19 AM 04/12/2022    9:53 AM  GAD 7 : Generalized Anxiety Score  Nervous, Anxious, on Edge 1 1 2 1   Control/stop worrying 0 1 2 1   Worry too much - different things 0 1 2 2   Trouble relaxing 0 1 2 1   Restless 0 1 2 1   Easily annoyed or irritable 0 1 3 2   Afraid - awful might happen 0 1 3 1   Total GAD 7 Score 1 7 16 9   Anxiety Difficulty Not difficult at all Somewhat difficult  Somewhat difficult Somewhat difficult      Physical Exam Constitutional:      Appearance: Normal appearance.  HENT:     Head: Normocephalic.  Cardiovascular:     Rate and Rhythm: Normal rate and regular rhythm.  Pulmonary:     Effort: Pulmonary effort is normal.  Neurological:     General: No focal deficit present.     Mental Status: She is alert and oriented to person, place, and time.  Psychiatric:        Mood and Affect: Mood normal.       Assessment & Plan:  Marland KitchenMarland KitchenShawon was seen today for follow-up.  Diagnoses and all orders for this visit:  Stress at home -     ARIPiprazole (ABILIFY) 5 MG tablet; Take 1 tablet (5 mg total) by mouth daily.  Essential hypertension -     lisinopril (ZESTRIL) 40 MG tablet; Take 1 tablet (40 mg total) by mouth daily.  Anxiety and depression -     ARIPiprazole (ABILIFY) 5 MG tablet; Take 1 tablet (5 mg total) by mouth daily.  Mild intermittent asthma without complication -     albuterol (VENTOLIN HFA) 108 (90 Base) MCG/ACT inhaler; Inhale 2 puffs into the lungs every 6 (six) hours as needed.  Episodic mood disorder (HCC) -     ARIPiprazole (ABILIFY) 5 MG tablet; Take 1 tablet (5 mg total) by mouth daily.  Chronic rhinitis -     fluticasone (FLONASE) 50 MCG/ACT nasal spray; Use 2 spray(s) in each nostril once daily   PHQ/GAD improving Increased abilify to 5mg  Refilled flonase and albuterol Start allegra daily.  Follow up in 3 months.    Tandy Gaw, PA-C

## 2022-11-25 ENCOUNTER — Ambulatory Visit: Payer: Medicare Other | Admitting: Family Medicine

## 2022-11-25 ENCOUNTER — Encounter: Payer: Self-pay | Admitting: Physician Assistant

## 2022-12-03 ENCOUNTER — Ambulatory Visit (INDEPENDENT_AMBULATORY_CARE_PROVIDER_SITE_OTHER): Payer: Medicare Other

## 2022-12-03 ENCOUNTER — Ambulatory Visit (INDEPENDENT_AMBULATORY_CARE_PROVIDER_SITE_OTHER): Payer: Medicare Other | Admitting: Physician Assistant

## 2022-12-03 ENCOUNTER — Encounter: Payer: Self-pay | Admitting: Physician Assistant

## 2022-12-03 ENCOUNTER — Telehealth: Payer: Self-pay | Admitting: Physician Assistant

## 2022-12-03 VITALS — BP 127/54 | HR 66 | Ht 63.0 in | Wt 155.0 lb

## 2022-12-03 DIAGNOSIS — R0602 Shortness of breath: Secondary | ICD-10-CM

## 2022-12-03 DIAGNOSIS — J4541 Moderate persistent asthma with (acute) exacerbation: Secondary | ICD-10-CM

## 2022-12-03 MED ORDER — IPRATROPIUM-ALBUTEROL 0.5-2.5 (3) MG/3ML IN SOLN
3.0000 mL | Freq: Once | RESPIRATORY_TRACT | Status: AC
Start: 2022-12-03 — End: 2022-12-03
  Administered 2022-12-03: 3 mL via RESPIRATORY_TRACT

## 2022-12-03 MED ORDER — IPRATROPIUM-ALBUTEROL 0.5-2.5 (3) MG/3ML IN SOLN
3.0000 mL | RESPIRATORY_TRACT | 1 refills | Status: AC | PRN
Start: 2022-12-03 — End: ?

## 2022-12-03 MED ORDER — METHYLPREDNISOLONE SODIUM SUCC 125 MG IJ SOLR
125.0000 mg | Freq: Once | INTRAMUSCULAR | Status: AC
Start: 2022-12-03 — End: 2022-12-03
  Administered 2022-12-03: 125 mg via INTRAMUSCULAR

## 2022-12-03 MED ORDER — PREDNISONE 20 MG PO TABS
ORAL_TABLET | ORAL | 0 refills | Status: DC
Start: 2022-12-03 — End: 2023-05-09

## 2022-12-03 MED ORDER — BUDESONIDE-FORMOTEROL FUMARATE 160-4.5 MCG/ACT IN AERO: 2.0000 | INHALATION_SPRAY | Freq: Two times a day (BID) | RESPIRATORY_TRACT | 3 refills | Status: DC

## 2022-12-03 MED ORDER — BUDESONIDE-FORMOTEROL FUMARATE 160-4.5 MCG/ACT IN AERO
2.0000 | INHALATION_SPRAY | Freq: Two times a day (BID) | RESPIRATORY_TRACT | 3 refills | Status: DC
Start: 2022-12-03 — End: 2023-02-14

## 2022-12-03 NOTE — Telephone Encounter (Signed)
Patient called requesting to switch the prescription of budesonide-formoterol (SYMBICORT) 160-4.5 MCG/ACT inhaler [[id number]]  to a generic one due to the price.   Pharmacist requested to be called to discuss the generic options.  Pharmacy -  [facility] - [place], [place] - [address]

## 2022-12-03 NOTE — Progress Notes (Signed)
Treatment plan stays the same. Lungs do not show any acute findings.

## 2022-12-03 NOTE — Progress Notes (Signed)
Acute Office Visit  Subjective:     Patient ID: Angelica [name], female    DOB: Oct 10, 1950, 71 y.o.   MRN: [id number]  Chief Complaint  Patient presents with   Cough    HPI Patient is in today for shortness of breath, cough, chest tightness. She was last seen on 4/28 in ED for asthma exacerbation and given prednisone. CXR was normal. She improved but symptoms then returned. She denies any fever, chills, body aches, sinus pressure or ear pain. She does have cough that can be productive at times. She is taking allegra, flonase daily and albuterol every 3 hours. She has had a hard time with her allergies this year.   Active Ambulatory Problems    Diagnosis Date Noted   Breast cancer (HCC) 01/31/2011   GAD (generalized anxiety disorder) 01/31/2011   Asthma 01/31/2011   Seasonal allergies 01/31/2011   Hypothyroid 01/31/2011   Episodic mood disorder (HCC) 10/17/2012   Anxiety and depression 08/03/2013   Chondromalacia of right patellofemoral joint 02/10/2014   Insomnia 03/15/2014   IBS (irritable bowel syndrome) 08/22/2014   Dyslipidemia (high LDL; low HDL) 03/09/2015   Elevated blood pressure 04/26/2015   Trapezius muscle spasm 09/28/2015   Epistaxis 11/14/2015   Essential hypertension 06/11/2016   Cellulitis and abscess of buttock 06/04/2017   Calculus of gallbladder 10/21/2017   Elevated lipase 10/21/2017   Diarrhea 10/21/2017   Upper abdominal pain 10/21/2017   Left-sided nosebleed 03/02/2018   TMJ pain dysfunction syndrome 03/02/2018   Colon polyps 03/31/2018   Neck fullness 08/31/2018   Rotator cuff syndrome, left 05/26/2019   Injection site reaction 04/04/2020   Osteopenia 05/10/2021   Personal history of breast cancer 10/15/2021   Diverticular disease of colon 10/15/2021   Hirsutism 10/19/2021   Night sweats 10/19/2021   Primary osteoarthritis of left knee 12/19/2021   Fall 03/08/2022   Snoring 05/15/2022   Non-restorative sleep 05/15/2022   No energy 05/15/2022    Post-menopausal 05/15/2022   Stress at home 10/15/2022   Resolved Ambulatory Problems    Diagnosis Date Noted   OAB (overactive bladder) 03/08/2015   Vitamin D deficiency 03/09/2015   Impingement syndrome of left shoulder 08/28/2015   Acute upper respiratory infection 12/15/2015   Past Medical History:  Diagnosis Date   Allergy    Anxiety    Asthma    Depression    Dyspnea    Hypertension    Hypothyroidism    Postmenopausal    Thyroid disease       ROS  See HPI.     Objective:    BP (!) 127/54 (BP Location: Left Arm, Patient Position: Sitting, Cuff Size: Normal)   Pulse 66   Ht 5\' 3"  (1.6 m)   Wt 155 lb (70.3 kg)   LMP 06/24/2000   SpO2 98%   BMI 27.46 kg/m  BP Readings from Last 3 Encounters:  12/03/22 (!) 127/54  11/12/22 129/67  10/20/22 (!) 119/50   Wt Readings from Last 3 Encounters:  12/03/22 155 lb (70.3 kg)  11/12/22 155 lb (70.3 kg)  10/15/22 160 lb (72.6 kg)   Duoneb given in office today.   Physical Exam Constitutional:      Appearance: Normal appearance.  HENT:     Head: Normocephalic.     Right Ear: Tympanic membrane, ear canal and external ear normal. There is no impacted cerumen.     Left Ear: Tympanic membrane, ear canal and external ear normal. There is no impacted cerumen.  Cardiovascular:     Rate and Rhythm: Normal rate and regular rhythm.     Pulses: Normal pulses.     Heart sounds: Normal heart sounds.  Pulmonary:     Effort: Pulmonary effort is normal.     Breath sounds: Wheezing and rhonchi present.     Comments: Bilateral lung wheezing and rhonchi of both lungs.  Musculoskeletal:     Cervical back: No tenderness.     Right lower leg: No edema.     Left lower leg: No edema.  Lymphadenopathy:     Cervical: No cervical adenopathy.  Neurological:     General: No focal deficit present.     Mental Status: She is alert and oriented to person, place, and time.  Psychiatric:        Mood and Affect: Mood normal.           Assessment & Plan:  Marland KitchenMarland KitchenKjersten was seen today for cough.  Diagnoses and all orders for this visit:  SOB (shortness of breath) -     DG Chest 2 View; Future -     predniSONE (DELTASONE) 20 MG tablet; Take 3 tablets for 3 days, take 2 tablets for 3 days, take 1 tablet for 3 days, take 1/2 tablet for 4 days. -     ipratropium-albuterol (DUONEB) 0.5-2.5 (3) MG/3ML SOLN; Take 3 mLs by nebulization every 4 (four) hours as needed (wheeze, SOB). -     For home use only DME Nebulizer machine -     budesonide-formoterol (SYMBICORT) 160-4.5 MCG/ACT inhaler; Inhale 2 puffs into the lungs 2 (two) times daily. -     ipratropium-albuterol (DUONEB) 0.5-2.5 (3) MG/3ML nebulizer solution 3 mL -     methylPREDNISolone sodium succinate (SOLU-MEDROL) 125 mg/2 mL injection 125 mg  Moderate persistent asthma with acute exacerbation -     DG Chest 2 View; Future -     predniSONE (DELTASONE) 20 MG tablet; Take 3 tablets for 3 days, take 2 tablets for 3 days, take 1 tablet for 3 days, take 1/2 tablet for 4 days. -     ipratropium-albuterol (DUONEB) 0.5-2.5 (3) MG/3ML SOLN; Take 3 mLs by nebulization every 4 (four) hours as needed (wheeze, SOB). -     For home use only DME Nebulizer machine -     budesonide-formoterol (SYMBICORT) 160-4.5 MCG/ACT inhaler; Inhale 2 puffs into the lungs 2 (two) times daily. -     ipratropium-albuterol (DUONEB) 0.5-2.5 (3) MG/3ML nebulizer solution 3 mL -     methylPREDNISolone sodium succinate (SOLU-MEDROL) 125 mg/2 mL injection 125 mg  Other orders -     Discontinue: budesonide-formoterol (SYMBICORT) 160-4.5 MCG/ACT inhaler; Inhale 2 puffs into the lungs 2 (two) times daily.  Vitals stable today, pulse ox 98 percent Pt had some improvement with nebulizer in office Sent duoneb to DME with solution to pharmacy to use every 4-6 hours as needed to replace albuterol inhaler Start prednisone taper Start symbicort Continue allegra and flonase Get stat CXR to make sure no  signs of bacteria infection Follow up in 4 weeks  Tandy Gaw, PA-C

## 2022-12-03 NOTE — Patient Instructions (Addendum)
Start prednisone and symbicort Continue on symbicort for at least 3 months Duoneb to replace rescue inhaler use   Asthma, Adult  Asthma is a condition that causes swelling and narrowing of the airways. These are the passages that lead from the nose and mouth down into the lungs. When asthma symptoms get worse it is called an asthma attack or flare. This can make it hard to breathe. Asthma flares can range from minor to life-threatening. There is no cure for asthma, but medicines and lifestyle changes can help to control it. What are the causes? It is not known exactly what causes asthma, but certain things can cause asthma symptoms to get worse (triggers). What can trigger an asthma attack? Cigarette smoke. Mold. Dust. Your pet's skin flakes (dander). Cockroaches. Pollen. Air pollution (like household cleaners, wood smoke, smog, or Therapist, occupational). What are the signs or symptoms? Trouble breathing (shortness of breath). Coughing. Making high-pitched whistling sounds when you breathe, most often when you breathe out (wheezing). Chest tightness. Tiredness with little activity. Poor exercise tolerance. How is this treated? Controller medicines that help prevent asthma symptoms. Fast-acting reliever or rescue medicines. These give short-term relief of asthma symptoms. Allergy medicines if your attacks are brought on by allergens. Medicines to help control the body's defense (immune) system. Staying away from the things that cause asthma attacks. Follow these instructions at home: Avoiding triggers in your home Do not allow anyone to smoke in your home. Limit use of fireplaces and wood stoves. Get rid of pests (such as roaches and mice) and their droppings. Keep your home clean. Clean your floors. Dust regularly. Use cleaning products that do not smell. Wash bed sheets and blankets every week in hot water. Dry them in a dryer. Have someone vacuum when you are not home. Change your  heating and air conditioning filters often. Use blankets that are made of polyester or cotton. General instructions Take over-the-counter and prescription medicines only as told by your doctor. Do not smoke or use any products that contain nicotine or tobacco. If you need help quitting, ask your doctor. Stay away from secondhand smoke. Avoid doing things outdoors when allergen counts are high and when air quality is low. Warm up before you exercise. Take time to cool down after exercise. Use a peak flow meter as told by your doctor. A peak flow meter is a tool that measures how well your lungs are working. Keep track of the peak flow meter's readings. Write them down. Follow your asthma action plan. This is a written plan for taking care of your asthma and treating your attacks. Make sure you get all the shots (vaccines) that your doctor recommends. Ask your doctor about a flu shot and a pneumonia shot. Keep all follow-up visits. Contact a doctor if: You have wheezing, shortness of breath, or a cough even while taking medicine to prevent attacks. The mucus you cough up (sputum) is thicker than usual. The mucus you cough up changes from clear or white to yellow, green, gray, or is bloody. You have problems from the medicine you are taking, such as: A rash. Itching. Swelling. Trouble breathing. You need reliever medicines more than 2-3 times a week. Your peak flow reading is still at 50-79% of your personal best after following the action plan for 1 hour. You have a fever. Get help right away if: You seem to be worse and are not responding to medicine during an asthma attack. You are short of breath even at rest.  You get short of breath when doing very little activity. You have trouble eating, drinking, or talking. You have chest pain or tightness. You have a fast heartbeat. Your lips or fingernails start to turn blue. You are light-headed or dizzy, or you faint. Your peak flow is  less than 50% of your personal best. You feel too tired to breathe normally. These symptoms may be an emergency. Get help right away. Call 911. Do not wait to see if the symptoms will go away. Do not drive yourself to the hospital. Summary Asthma is a long-term (chronic) condition in which the airways get tight and narrow. An asthma attack can make it hard to breathe. Asthma cannot be cured, but medicines and lifestyle changes can help control it. Make sure you understand how to avoid triggers and how and when to use your medicines. Avoid things that can cause allergy symptoms (allergens). These include animal skin flakes (dander) and pollen from trees or grass. Avoid things that pollute the air. These may include household cleaners, wood smoke, smog, or chemical odors. This information is not intended to replace advice given to you by your health care provider. Make sure you discuss any questions you have with your health care provider. Document Revised: 03/19/2021 Document Reviewed: 03/19/2021 Elsevier Patient Education  2024 [employer].

## 2022-12-23 ENCOUNTER — Telehealth: Payer: Self-pay

## 2022-12-23 NOTE — Telephone Encounter (Signed)
Initiated Prior authorization BJY:[id number] 160-4.5MCG/ACT aerosol Via: Covermymeds Case/Key:[id number] Status: n/a as of  12/23/22 Reason:This medication or product is on your plan's list of covered drugs. Prior authorization is not required at this time. If your pharmacy has questions regarding the processing of your prescription, please have them call the [facility] pharmacy help desk at [phone] Notified Pt via: Mychart

## 2022-12-25 ENCOUNTER — Other Ambulatory Visit: Payer: Self-pay | Admitting: Physician Assistant

## 2022-12-25 DIAGNOSIS — F419 Anxiety disorder, unspecified: Secondary | ICD-10-CM

## 2022-12-26 ENCOUNTER — Other Ambulatory Visit: Payer: Self-pay | Admitting: Physician Assistant

## 2022-12-26 DIAGNOSIS — J4520 Mild intermittent asthma, uncomplicated: Secondary | ICD-10-CM

## 2023-01-25 ENCOUNTER — Ambulatory Visit (INDEPENDENT_AMBULATORY_CARE_PROVIDER_SITE_OTHER): Payer: Medicare Other

## 2023-01-25 ENCOUNTER — Ambulatory Visit
Admission: EM | Admit: 2023-01-25 | Discharge: 2023-01-25 | Disposition: A | Discharge status: Home / Self Care | Payer: Medicare Other | Attending: Family Medicine | Admitting: Family Medicine

## 2023-01-25 DIAGNOSIS — M25572 Pain in left ankle and joints of left foot: Secondary | ICD-10-CM | POA: Diagnosis not present

## 2023-01-25 DIAGNOSIS — S93492A Sprain of other ligament of left ankle, initial encounter: Secondary | ICD-10-CM

## 2023-01-25 DIAGNOSIS — M7732 Calcaneal spur, left foot: Secondary | ICD-10-CM | POA: Diagnosis not present

## 2023-01-25 MED ORDER — IBUPROFEN 800 MG PO TABS: 800.0000 mg | ORAL_TABLET | Freq: Three times a day (TID) | ORAL | 0 refills | Status: DC | PRN

## 2023-01-25 NOTE — ED Triage Notes (Signed)
[facility] presents to [facility] with co of left foot pain since last night. Pt reports she was trying to get a cat out of her garage and it got up under her feet and made her fall.

## 2023-01-25 NOTE — ED Provider Notes (Signed)
[facility]    CSN: [id number] Arrival date & time: 01/25/23  4098      History   Chief Complaint Chief Complaint  Patient presents with   left foot injury     HPI KAMALA KOLTON is a 72 y.o. female.   HPI  72 year old woman with osteopenia.  Larey Seat in her home yesterday.  She states she tripped over her cat, inverted her ankle and fell to the ground.  Has pain in her ankle.  Cannot comfortably bear weight.  No prior ankle problems.  Past Medical History:  Diagnosis Date   Allergy    Anxiety    Asthma    Breast cancer (HCC) 2009   Depression    Dyspnea    with heavy pollen    Hypertension    Hypothyroidism    Postmenopausal    Thyroid disease     Patient Active Problem List   Diagnosis Date Noted   Stress at home 10/15/2022   Snoring 05/15/2022   Non-restorative sleep 05/15/2022   No energy 05/15/2022   Post-menopausal 05/15/2022   Fall 03/08/2022   Primary osteoarthritis of left knee 12/19/2021   Hirsutism 10/19/2021   Night sweats 10/19/2021   Personal history of breast cancer 10/15/2021   Diverticular disease of colon 10/15/2021   Osteopenia 05/10/2021   Injection site reaction 04/04/2020   Rotator cuff syndrome, left 05/26/2019   Neck fullness 08/31/2018   Colon polyps 03/31/2018   Left-sided nosebleed 03/02/2018   TMJ pain dysfunction syndrome 03/02/2018   Calculus of gallbladder 10/21/2017   Elevated lipase 10/21/2017   Diarrhea 10/21/2017   Upper abdominal pain 10/21/2017   Cellulitis and abscess of buttock 06/04/2017   Essential hypertension 06/11/2016   Epistaxis 11/14/2015   Trapezius muscle spasm 09/28/2015   Elevated blood pressure 04/26/2015   Dyslipidemia (high LDL; low HDL) 03/09/2015   IBS (irritable bowel syndrome) 08/22/2014   Insomnia 03/15/2014   Chondromalacia of right patellofemoral joint 02/10/2014   Anxiety and depression 08/03/2013   Episodic mood disorder (HCC) 10/17/2012   Breast cancer (HCC) 01/31/2011    GAD (generalized anxiety disorder) 01/31/2011   Asthma 01/31/2011   Seasonal allergies 01/31/2011   Hypothyroid 01/31/2011    Past Surgical History:  Procedure Laterality Date   ADENOIDECTOMY     APPENDECTOMY     BREAST RECONSTRUCTION  06/2008   CHOLECYSTECTOMY N/A 11/12/2017   Procedure: LAPAROSCOPIC CHOLECYSTECTOMY WITH INTRAOPERATIVE CHOLANGIOGRAM ERAS PATHWAY;  Surgeon: Harriette Bouillon, MD;  Location: [facility];  Service: General;  Laterality: N/A;   MASTECTOMY  2009   [facility]   TONSILLECTOMY      OB History   No obstetric history on file.      Home Medications    Prior to Admission medications   Medication Sig Start Date End Date Taking? Authorizing Provider  ibuprofen (ADVIL) 800 MG tablet Take 1 tablet (800 mg total) by mouth every 8 (eight) hours as needed. 01/25/23  Yes Eustace Moore, MD  albuterol (VENTOLIN HFA) 108 (90 Base) MCG/ACT inhaler INHALE 2 PUFFS BY MOUTH EVERY 6 HOURS AS NEEDED 12/27/22   Breeback, [name] L, PA-C  ARIPiprazole (ABILIFY) 5 MG tablet Take 1 tablet (5 mg total) by mouth daily. 11/12/22   Breeback, [name] L, PA-C  atorvastatin (LIPITOR) 40 MG tablet Take 1 tablet (40 mg total) by mouth daily. 04/12/22   Breeback, [name] L, PA-C  budesonide-formoterol (SYMBICORT) 160-4.5 MCG/ACT inhaler Inhale 2 puffs into the lungs 2 (two) times daily. 12/03/22  Breeback, [name] L, PA-C  busPIRone (BUSPAR) 30 MG tablet Take 1 tablet (30 mg total) by mouth in the morning and at bedtime. 10/15/22   Breeback, [name] L, PA-C  clonazePAM (KLONOPIN) 0.5 MG tablet TAKE 1 TABLET BY MOUTH AS NEEDED FOR ANXIETY . DO NOT EXCEED 1 PER 24 HOURS 12/25/22   Breeback, [name] L, PA-C  dicyclomine (BENTYL) 10 MG capsule Take 1 capsule (10 mg total) by mouth 2 (two) times daily. 08/16/22   Jomarie Longs, PA-C  escitalopram (LEXAPRO) 20 MG tablet Take 1 tablet by mouth once daily 08/16/22   Breeback, [name] L, PA-C  fluticasone (FLONASE) 50 MCG/ACT nasal spray Use 2 spray(s) in each nostril once daily  11/12/22   Breeback, [name] L, PA-C  hydrochlorothiazide (HYDRODIURIL) 12.5 MG tablet Take 1 tablet (12.5 mg total) by mouth daily. 08/16/22   Breeback, [name] L, PA-C  ipratropium-albuterol (DUONEB) 0.5-2.5 (3) MG/3ML SOLN Take 3 mLs by nebulization every 4 (four) hours as needed (wheeze, SOB). 12/03/22   Breeback, Lonna Cobb, PA-C  lamoTRIgine (LAMICTAL) 100 MG tablet Take 1 tablet (100 mg total) by mouth 2 (two) times daily. 08/16/22   Jomarie Longs, PA-C  levothyroxine (SYNTHROID) 88 MCG tablet Take 1 tablet (88 mcg total) by mouth daily. 06/25/22   Breeback, [name] L, PA-C  lisinopril (ZESTRIL) 40 MG tablet Take 1 tablet (40 mg total) by mouth daily. 11/12/22   Breeback, [name] L, PA-C  predniSONE (DELTASONE) 20 MG tablet Take 3 tablets for 3 days, take 2 tablets for 3 days, take 1 tablet for 3 days, take 1/2 tablet for 4 days. 12/03/22   Jomarie Longs, PA-C    Family History Family History  Problem Relation Age of Onset   Stroke Mother    Cancer Mother        colon?   Hypertension Father    Hyperlipidemia Father     Social History Social History   Tobacco Use   Smoking status: Former    Current packs/day: 0.00    Average packs/day: 1.5 packs/day for 45.0 years (67.5 ttl pk-yrs)    Types: Cigarettes    Start date: 06/24/1966    Quit date: 06/25/2011    Years since quitting: 11.5   Smokeless tobacco: Never  Vaping Use   Vaping status: Some Days  Substance Use Topics   Alcohol use: No   Drug use: No     Allergies   Penicillins, Red dye, Sulfa antibiotics, Amitriptyline, Macrodantin, and Nitrofurantoin   Review of Systems Review of Systems See HPI  Physical Exam  Vital Signs BP 132/62   Pulse 89   Temp (!) 96.5 F (35.8 C)   Resp 16   LMP 06/24/2000   SpO2 98%       Physical Exam Constitutional:      General: She is not in acute distress.    Appearance: She is well-developed and normal weight.  HENT:     Head: Normocephalic and atraumatic.  Eyes:      Conjunctiva/sclera: Conjunctivae normal.     Pupils: Pupils are equal, round, and reactive to light.  Cardiovascular:     Rate and Rhythm: Normal rate.  Pulmonary:     Effort: Pulmonary effort is normal. No respiratory distress.  Abdominal:     General: There is no distension.     Palpations: Abdomen is soft.  Musculoskeletal:        General: Swelling, tenderness and signs of injury present. No deformity. Normal range of motion.  Cervical back: Normal range of motion.     Comments: Left ankle is examined.  There is swelling around the lateral ankle.  There is tenderness to palpation over the tip of the distal fibula.  Limited range of motion secondary to pain.  No instability identified  Skin:    General: Skin is warm and dry.  Neurological:     Mental Status: She is alert.     Gait: Gait abnormal.      UC Treatments / Results  Labs (all labs ordered are listed, but only abnormal results are displayed) Labs Reviewed - No data to display  EKG   Radiology DG Ankle Complete Left  Result Date: 01/25/2023 CLINICAL DATA:  Larey Seat last night with medial ankle pain EXAM: LEFT ANKLE COMPLETE - 3+ VIEW COMPARISON:  None Available. FINDINGS: Mild soft tissue swelling. No evidence of regional fracture or dislocation. Plantar calcaneal spur incidentally noted. IMPRESSION: Mild soft tissue swelling. No fracture or dislocation. Electronically Signed   By: Paulina Fusi M.D.   On: 01/25/2023 10:33    Procedures Procedures (including critical care time)  Medications Ordered in UC Medications - No data to display  Initial Impression / Assessment and Plan / UC Course  I have reviewed the triage vital signs and the nursing notes.  Pertinent labs & imaging results that were available during my care of the patient were reviewed by me and considered in my medical decision making (see chart for details).    Final Clinical Impressions(s) / UC Diagnoses   Final diagnoses:  Sprain of anterior  talofibular ligament of left ankle, initial encounter     Discharge Instructions      Use ice and elevation to reduce the pain and swelling Limit walking while ankle is painful Wear brace until you can walk comfortably without it Take ibuprofen 3 x a day with food See orthopedic if you fail to improve      ED Prescriptions     Medication Sig Dispense Auth. Provider   ibuprofen (ADVIL) 800 MG tablet Take 1 tablet (800 mg total) by mouth every 8 (eight) hours as needed. 30 tablet Eustace Moore, MD      PDMP not reviewed this encounter.   Eustace Moore, MD 01/25/23 1049

## 2023-01-25 NOTE — Discharge Instructions (Signed)
Use ice and elevation to reduce the pain and swelling Limit walking while ankle is painful Wear brace until you can walk comfortably without it Take ibuprofen 3 x a day with food See orthopedic if you fail to improve

## 2023-02-14 ENCOUNTER — Encounter: Payer: Self-pay | Admitting: Physician Assistant

## 2023-02-14 ENCOUNTER — Ambulatory Visit (INDEPENDENT_AMBULATORY_CARE_PROVIDER_SITE_OTHER): Payer: Medicare Other | Admitting: Physician Assistant

## 2023-02-14 VITALS — BP 131/45 | HR 81 | Ht 63.0 in | Wt 152.1 lb

## 2023-02-14 DIAGNOSIS — Z1211 Encounter for screening for malignant neoplasm of colon: Secondary | ICD-10-CM | POA: Diagnosis not present

## 2023-02-14 DIAGNOSIS — Z23 Encounter for immunization: Secondary | ICD-10-CM | POA: Diagnosis not present

## 2023-02-14 DIAGNOSIS — R0602 Shortness of breath: Secondary | ICD-10-CM

## 2023-02-14 DIAGNOSIS — K580 Irritable bowel syndrome with diarrhea: Secondary | ICD-10-CM | POA: Diagnosis not present

## 2023-02-14 DIAGNOSIS — F39 Unspecified mood [affective] disorder: Secondary | ICD-10-CM

## 2023-02-14 DIAGNOSIS — F439 Reaction to severe stress, unspecified: Secondary | ICD-10-CM | POA: Diagnosis not present

## 2023-02-14 DIAGNOSIS — E785 Hyperlipidemia, unspecified: Secondary | ICD-10-CM | POA: Diagnosis not present

## 2023-02-14 DIAGNOSIS — F32A Depression, unspecified: Secondary | ICD-10-CM | POA: Diagnosis not present

## 2023-02-14 DIAGNOSIS — F419 Anxiety disorder, unspecified: Secondary | ICD-10-CM | POA: Diagnosis not present

## 2023-02-14 DIAGNOSIS — J4541 Moderate persistent asthma with (acute) exacerbation: Secondary | ICD-10-CM | POA: Diagnosis not present

## 2023-02-14 DIAGNOSIS — I10 Essential (primary) hypertension: Secondary | ICD-10-CM

## 2023-02-14 MED ORDER — LISINOPRIL 40 MG PO TABS: 40.0000 mg | ORAL_TABLET | Freq: Every day | ORAL | 0 refills | Status: DC

## 2023-02-14 MED ORDER — ESCITALOPRAM OXALATE 20 MG PO TABS
ORAL_TABLET | ORAL | 1 refills | Status: DC
Start: 2023-02-14 — End: 2023-04-22

## 2023-02-14 MED ORDER — ARIPIPRAZOLE 5 MG PO TABS: 5.0000 mg | ORAL_TABLET | Freq: Every day | ORAL | 1 refills | Status: DC

## 2023-02-14 MED ORDER — BUSPIRONE HCL 30 MG PO TABS
30.0000 mg | ORAL_TABLET | Freq: Two times a day (BID) | ORAL | 1 refills | Status: DC
Start: 2023-02-14 — End: 2023-05-07

## 2023-02-14 MED ORDER — ATORVASTATIN CALCIUM 40 MG PO TABS: 40.0000 mg | ORAL_TABLET | Freq: Every day | ORAL | 3 refills | Status: DC

## 2023-02-14 MED ORDER — LAMOTRIGINE 100 MG PO TABS
100.0000 mg | ORAL_TABLET | Freq: Two times a day (BID) | ORAL | 1 refills | Status: DC
Start: 2023-02-14 — End: 2023-09-15

## 2023-02-14 MED ORDER — BUDESONIDE-FORMOTEROL FUMARATE 160-4.5 MCG/ACT IN AERO: 2.0000 | INHALATION_SPRAY | Freq: Two times a day (BID) | RESPIRATORY_TRACT | 3 refills | Status: DC

## 2023-02-14 MED ORDER — HYDROCHLOROTHIAZIDE 12.5 MG PO TABS: 12.5000 mg | ORAL_TABLET | Freq: Every day | ORAL | 1 refills | Status: DC

## 2023-02-14 MED ORDER — DICYCLOMINE HCL 10 MG PO CAPS
10.0000 mg | ORAL_CAPSULE | Freq: Two times a day (BID) | ORAL | 3 refills | Status: DC
Start: 2023-02-14 — End: 2023-12-31

## 2023-02-14 NOTE — Progress Notes (Signed)
Established Patient Office Visit  Subjective   Patient ID: Angelica [name], female    DOB: [birth year]  Age: 72 y.o. MRN: [id number]  Chief Complaint  Patient presents with   Medical Management of Chronic Issues    68mo fup med refills     HPI Pt is a 72 yo female with Asthma, Anxiety, Depression, HTN, IBS, HLD who presents to the clinic for follow up and medication refills.   She was started on symbicort 4 weeks ago. She is doing wonderful. No concerns. Her breathing is MUCH better. She is not using albuterol at all.   She denies any CP, palpitations, headaches or vision changes.   IBS-controlled with bentyl.   Mood is much better. She is out of abilify and noticed some twitching of muscles around mouth. She needs refills.   .. Active Ambulatory Problems    Diagnosis Date Noted   Breast cancer (HCC) 01/31/2011   GAD (generalized anxiety disorder) 01/31/2011   Asthma 01/31/2011   Seasonal allergies 01/31/2011   Hypothyroid 01/31/2011   Episodic mood disorder (HCC) 10/17/2012   Anxiety and depression 08/03/2013   Chondromalacia of right patellofemoral joint 02/10/2014   Insomnia 03/15/2014   IBS (irritable bowel syndrome) 08/22/2014   Dyslipidemia (high LDL; low HDL) 03/09/2015   Elevated blood pressure 04/26/2015   Trapezius muscle spasm 09/28/2015   Epistaxis 11/14/2015   Essential hypertension 06/11/2016   Cellulitis and abscess of buttock 06/04/2017   Calculus of gallbladder 10/21/2017   Elevated lipase 10/21/2017   Diarrhea 10/21/2017   Upper abdominal pain 10/21/2017   Left-sided nosebleed 03/02/2018   TMJ pain dysfunction syndrome 03/02/2018   Colon polyps 03/31/2018   Neck fullness 08/31/2018   Rotator cuff syndrome, left 05/26/2019   Injection site reaction 04/04/2020   Osteopenia 05/10/2021   Personal history of breast cancer 10/15/2021   Diverticular disease of colon 10/15/2021   Hirsutism 10/19/2021   Night sweats 10/19/2021   Primary osteoarthritis of  left knee 12/19/2021   Fall 03/08/2022   Snoring 05/15/2022   Non-restorative sleep 05/15/2022   No energy 05/15/2022   Post-menopausal 05/15/2022   Stress at home 10/15/2022   Resolved Ambulatory Problems    Diagnosis Date Noted   OAB (overactive bladder) 03/08/2015   Vitamin D deficiency 03/09/2015   Impingement syndrome of left shoulder 08/28/2015   Acute upper respiratory infection 12/15/2015   Past Medical History:  Diagnosis Date   Allergy    Anxiety    Asthma    Depression    Dyspnea    Hypertension    Hypothyroidism    Postmenopausal    Thyroid disease      Review of Systems  All other systems reviewed and are negative.     Objective:     BP (!) 131/45   Pulse 81   Ht 5\' 3"  (1.6 m)   Wt 152 lb 1.3 oz (69 kg)   LMP 06/24/2000   SpO2 99%   BMI 26.94 kg/m  BP Readings from Last 3 Encounters:  02/14/23 (!) 131/45  01/25/23 132/62  12/03/22 (!) 127/54   Wt Readings from Last 3 Encounters:  02/14/23 152 lb 1.3 oz (69 kg)  12/03/22 155 lb (70.3 kg)  11/12/22 155 lb (70.3 kg)    ..    02/14/2023   10:35 AM 12/03/2022   10:29 AM 11/12/2022   11:07 AM 10/15/2022   10:11 AM 08/16/2022   10:18 AM  Depression screen PHQ 2/9  Decreased Interest 0  0 0 2 0  Down, Depressed, Hopeless 0 0 0 2 0  PHQ - 2 Score 0 0 0 4 0  Altered sleeping   0 1 0  Tired, decreased energy   1 1 0  Change in appetite   0 1 1  Feeling bad or failure about yourself    0 1 0  Trouble concentrating   0 1 0  Moving slowly or fidgety/restless   0 1 0  Suicidal thoughts   0 0 0  PHQ-9 Score   1 10 1   Difficult doing work/chores   Not difficult at all Somewhat difficult Somewhat difficult   ..    02/14/2023   10:35 AM 11/12/2022   11:08 AM 10/15/2022   10:11 AM 08/16/2022   10:19 AM  GAD 7 : Generalized Anxiety Score  Nervous, Anxious, on Edge 1 1 1 2   Control/stop worrying 1 0 1 2  Worry too much - different things 1 0 1 2  Trouble relaxing 0 0 1 2  Restless 1 0 1 2  Easily  annoyed or irritable 0 0 1 3  Afraid - awful might happen 0 0 1 3  Total GAD 7 Score 4 1 7 16   Anxiety Difficulty Not difficult at all Not difficult at all Somewhat difficult Somewhat difficult      Physical Exam Constitutional:      Appearance: Normal appearance.  HENT:     Head: Normocephalic.  Cardiovascular:     Rate and Rhythm: Normal rate and regular rhythm.     Heart sounds: Normal heart sounds.  Pulmonary:     Effort: Pulmonary effort is normal.     Breath sounds: Normal breath sounds.  Neurological:     General: No focal deficit present.     Mental Status: She is alert and oriented to person, place, and time.  Psychiatric:        Mood and Affect: Mood normal.         Assessment & Plan:  Marland KitchenMarland KitchenTeona was seen today for medical management of chronic issues.  Diagnoses and all orders for this visit:  Moderate persistent asthma with acute exacerbation -     budesonide-formoterol (SYMBICORT) 160-4.5 MCG/ACT inhaler; Inhale 2 puffs into the lungs 2 (two) times daily.  Flu vaccine need -     Flu vaccine HIGH DOSE PF (Fluzone High dose)  Anxiety and depression -     busPIRone (BUSPAR) 30 MG tablet; Take 1 tablet (30 mg total) by mouth in the morning and at bedtime. -     lamoTRIgine (LAMICTAL) 100 MG tablet; Take 1 tablet (100 mg total) by mouth 2 (two) times daily. -     escitalopram (LEXAPRO) 20 MG tablet; Take 1 tablet by mouth once daily -     ARIPiprazole (ABILIFY) 5 MG tablet; Take 1 tablet (5 mg total) by mouth daily.  Stress at home -     busPIRone (BUSPAR) 30 MG tablet; Take 1 tablet (30 mg total) by mouth in the morning and at bedtime. -     ARIPiprazole (ABILIFY) 5 MG tablet; Take 1 tablet (5 mg total) by mouth daily.  SOB (shortness of breath) -     budesonide-formoterol (SYMBICORT) 160-4.5 MCG/ACT inhaler; Inhale 2 puffs into the lungs 2 (two) times daily.  Episodic mood disorder (HCC) -     lamoTRIgine (LAMICTAL) 100 MG tablet; Take 1 tablet (100 mg  total) by mouth 2 (two) times daily. -  ARIPiprazole (ABILIFY) 5 MG tablet; Take 1 tablet (5 mg total) by mouth daily.  Dyslipidemia (high LDL; low HDL) -     atorvastatin (LIPITOR) 40 MG tablet; Take 1 tablet (40 mg total) by mouth daily.  Essential hypertension -     lisinopril (ZESTRIL) 40 MG tablet; Take 1 tablet (40 mg total) by mouth daily. -     hydrochlorothiazide (HYDRODIURIL) 12.5 MG tablet; Take 1 tablet (12.5 mg total) by mouth daily.  Irritable bowel syndrome with diarrhea -     dicyclomine (BENTYL) 10 MG capsule; Take 1 capsule (10 mg total) by mouth 2 (two) times daily.  Colon cancer screening -     Ambulatory referral to Gastroenterology   Pt is doing great on symbicort and not having to use rescue inhaler at all Reminded to rinse and spit after each use Follow up if symptoms worsening  Mood controlled- refilled lexapro/lamictal/abilify  BP to goal-refilled lisinopril  IBS-controlled, refilled bentyl.   Need for colonoscopy, referral placed.   Labs UTD, will have drawn at follow up in 4 months.   Flu vaccine given today!     Return in about 4 months (around 06/16/2023) for Follow up.    Tandy Gaw, PA-C

## 2023-02-28 ENCOUNTER — Telehealth: Payer: Self-pay | Admitting: Internal Medicine

## 2023-02-28 NOTE — Telephone Encounter (Signed)
Okay to schedule for direct colonoscopy at [facility] for history of colon polyps.  Colonoscopy 03/30/18: Adequate prep. 6 small sessile polyps in the cecum, ascending colon, and rectosigmoid colon removed with cold biopsies. Extensive diverticulosis in the entire colon. Repeat colonoscopy recommended in 5 years for surveillance. Path: Cecum and ascending colon polyps were tubular adenomas. Rectosigmoid polyps were HP and lymphoid aggregates.

## 2023-02-28 NOTE — Telephone Encounter (Signed)
Good Afternoon Dr. Leonides Schanz,   Patient called stating her PCP had sent over a referral to our practice for patient to have a colonoscopy. Patients last colonoscopy was in October of 2019 with [facility]. Patients records are in epic, will you please review and advise on scheduling patient?   Thank you

## 2023-03-05 ENCOUNTER — Encounter: Payer: Self-pay | Admitting: Internal Medicine

## 2023-03-10 ENCOUNTER — Encounter: Payer: Self-pay | Admitting: Internal Medicine

## 2023-04-02 ENCOUNTER — Ambulatory Visit (AMBULATORY_SURGERY_CENTER): Payer: Medicare Other | Admitting: *Deleted

## 2023-04-02 ENCOUNTER — Encounter: Payer: Self-pay | Admitting: Internal Medicine

## 2023-04-02 VITALS — Ht 63.0 in | Wt 147.0 lb

## 2023-04-02 DIAGNOSIS — Z860100 Personal history of colon polyps, unspecified: Secondary | ICD-10-CM

## 2023-04-02 DIAGNOSIS — Z800 Family history of malignant neoplasm of digestive organs: Secondary | ICD-10-CM

## 2023-04-02 MED ORDER — NA SULFATE-K SULFATE-MG SULF 17.5-3.13-1.6 GM/177ML PO SOLN
1.0000 | Freq: Once | ORAL | 0 refills | Status: AC
Start: 2023-04-02 — End: 2023-04-02

## 2023-04-02 NOTE — Progress Notes (Signed)
Pt's name and DOB verified at the beginning of the pre-visit wit 2 identifiers  Pt denies any difficulty with ambulating,sitting, laying down or rolling side to side  Gave both [facility] main # and MD on call # prior to instructions.   No egg or soy allergy known to patient   No issues known to pt with past sedation with any surgeries or procedures  Pt denies having issues being intubated  Pt has no issues moving head neck or swallowing  No FH of Malignant Hyperthermia  Pt is not on diet pills or shots  Pt is not on home 02   Pt is not on blood thinners   Pt denies issues with constipation   Pt is not on dialysis  Pt denise any abnormal heart rhythms   Pt denies any upcoming cardiac testing  Pt encouraged to use to use Singlecare or Goodrx to reduce cost   Patient's chart reviewed by Cathlyn Parsons CNRA prior to pre-visit and patient appropriate for the [facility].  Pre-visit completed and red dot placed by patient's name on their procedure day (on provider's schedule).  .  Visit by phone  Pt states weight is 147 lb  Instructed pt why it is important to and  to call if they have any changes in health or new medications. Directed them to the # given and on instructions.     Instructions reviewed with pt and pt states understanding. Instructed to review again prior to procedure. Pt states they will.   Instructions sent by mail with coupon and by my chart

## 2023-04-13 ENCOUNTER — Encounter: Payer: Self-pay | Admitting: Certified Registered Nurse Anesthetist

## 2023-04-16 ENCOUNTER — Ambulatory Visit: Payer: Medicare Other | Admitting: Internal Medicine

## 2023-04-16 ENCOUNTER — Telehealth: Payer: Self-pay | Admitting: Physician Assistant

## 2023-04-16 ENCOUNTER — Encounter: Payer: Self-pay | Admitting: Internal Medicine

## 2023-04-16 VITALS — BP 112/61 | HR 70 | Temp 97.4°F | Resp 17 | Ht 63.0 in | Wt 147.0 lb

## 2023-04-16 DIAGNOSIS — Z860100 Personal history of colon polyps, unspecified: Secondary | ICD-10-CM

## 2023-04-16 DIAGNOSIS — Z800 Family history of malignant neoplasm of digestive organs: Secondary | ICD-10-CM | POA: Diagnosis not present

## 2023-04-16 DIAGNOSIS — I10 Essential (primary) hypertension: Secondary | ICD-10-CM | POA: Diagnosis not present

## 2023-04-16 DIAGNOSIS — D123 Benign neoplasm of transverse colon: Secondary | ICD-10-CM

## 2023-04-16 DIAGNOSIS — D124 Benign neoplasm of descending colon: Secondary | ICD-10-CM

## 2023-04-16 DIAGNOSIS — J4520 Mild intermittent asthma, uncomplicated: Secondary | ICD-10-CM

## 2023-04-16 DIAGNOSIS — Z1211 Encounter for screening for malignant neoplasm of colon: Secondary | ICD-10-CM | POA: Diagnosis not present

## 2023-04-16 DIAGNOSIS — E039 Hypothyroidism, unspecified: Secondary | ICD-10-CM | POA: Diagnosis not present

## 2023-04-16 MED ORDER — SODIUM CHLORIDE 0.9 % IV SOLN
500.0000 mL | Freq: Once | INTRAVENOUS | Status: DC
Start: 2023-04-16 — End: 2023-04-16

## 2023-04-16 MED ORDER — ALBUTEROL SULFATE HFA 108 (90 BASE) MCG/ACT IN AERS: 2.0000 | INHALATION_SPRAY | Freq: Four times a day (QID) | RESPIRATORY_TRACT | 0 refills | Status: DC | PRN

## 2023-04-16 MED ORDER — ALBUTEROL SULFATE HFA 108 (90 BASE) MCG/ACT IN AERS: 2.0000 | INHALATION_SPRAY | Freq: Four times a day (QID) | RESPIRATORY_TRACT | 11 refills | Status: AC | PRN

## 2023-04-16 NOTE — Progress Notes (Signed)
GASTROENTEROLOGY PROCEDURE H&P NOTE   Primary Care Physician: Jomarie Longs, PA-C    Reason for Procedure:   History of colon polyps, family history of colon cancer  Plan:    Colonoscopy  Patient is appropriate for endoscopic procedure(s) in the ambulatory ([facility]) setting.  The nature of the procedure, as well as the risks, benefits, and alternatives were carefully and thoroughly reviewed with the patient. Ample time for discussion and questions allowed. The patient understood, was satisfied, and agreed to proceed.     HPI: Angelica [name] is a 72 y.o. female who presents for colonoscopy for history of colon polyps and family history of colon cancer. Mother had colon cancer. Denies blood in stools, changes in bowel habits, or unintentional weight loss.  Colonoscopy 03/30/18: Adequate prep. 6 small sessile polyps in the cecum, ascending colon, and rectosigmoid colon removed with cold biopsies. Extensive diverticulosis in the entire colon. Repeat colonoscopy recommended in 5 years for surveillance. Path: Cecum and ascending colon polyps were tubular adenomas. Rectosigmoid polyps were HP and lymphoid aggregates.  Past Medical History:  Diagnosis Date   Allergy    Anxiety    Asthma    Breast cancer (HCC) 2009   Depression    Dyspnea    with heavy pollen    Hypertension    Hypothyroidism    Postmenopausal    Thyroid disease     Past Surgical History:  Procedure Laterality Date   ADENOIDECTOMY     APPENDECTOMY     BREAST RECONSTRUCTION  06/2008   CHOLECYSTECTOMY N/A 11/12/2017   Procedure: LAPAROSCOPIC CHOLECYSTECTOMY WITH INTRAOPERATIVE CHOLANGIOGRAM ERAS PATHWAY;  Surgeon: Harriette Bouillon, MD;  Location: [facility] OR;  Service: General;  Laterality: N/A;   MASTECTOMY  2009   [facility]Ca   TONSILLECTOMY      Prior to Admission medications   Medication Sig Start Date End Date Taking? Authorizing Provider  albuterol (VENTOLIN HFA) 108 (90 Base) MCG/ACT inhaler INHALE 2 PUFFS BY MOUTH  EVERY 6 HOURS AS NEEDED 12/27/22  Yes Breeback, [name] L, PA-C  ARIPiprazole (ABILIFY) 5 MG tablet Take 1 tablet (5 mg total) by mouth daily. 02/14/23  Yes Breeback, [name] L, PA-C  atorvastatin (LIPITOR) 40 MG tablet Take 1 tablet (40 mg total) by mouth daily. 02/14/23  Yes Breeback, [name] L, PA-C  budesonide-formoterol (SYMBICORT) 160-4.5 MCG/ACT inhaler Inhale 2 puffs into the lungs 2 (two) times daily. 02/14/23  Yes Breeback, [name] L, PA-C  busPIRone (BUSPAR) 30 MG tablet Take 1 tablet (30 mg total) by mouth in the morning and at bedtime. 02/14/23  Yes Breeback, [name] L, PA-C  dicyclomine (BENTYL) 10 MG capsule Take 1 capsule (10 mg total) by mouth 2 (two) times daily. 02/14/23  Yes Breeback, [name] L, PA-C  escitalopram (LEXAPRO) 20 MG tablet Take 1 tablet by mouth once daily 02/14/23  Yes Breeback, [name] L, PA-C  hydrochlorothiazide (HYDRODIURIL) 12.5 MG tablet Take 1 tablet (12.5 mg total) by mouth daily. 02/14/23  Yes Breeback, [name] L, PA-C  lamoTRIgine (LAMICTAL) 100 MG tablet Take 1 tablet (100 mg total) by mouth 2 (two) times daily. 02/14/23  Yes Breeback, [name] L, PA-C  levothyroxine (SYNTHROID) 88 MCG tablet Take 1 tablet (88 mcg total) by mouth daily. 06/25/22  Yes Breeback, [name] L, PA-C  lisinopril (ZESTRIL) 40 MG tablet Take 1 tablet (40 mg total) by mouth daily. 02/14/23  Yes Breeback, [name] L, PA-C  clonazePAM (KLONOPIN) 0.5 MG tablet TAKE 1 TABLET BY MOUTH AS NEEDED FOR ANXIETY . DO NOT  EXCEED 1 PER 24 HOURS 12/25/22   Breeback, [name] L, PA-C  fluticasone (FLONASE) 50 MCG/ACT nasal spray Use 2 spray(s) in each nostril once daily Patient not taking: Reported on 04/02/2023 11/12/22   Jomarie Longs, PA-C  ibuprofen (ADVIL) 800 MG tablet Take 1 tablet (800 mg total) by mouth every 8 (eight) hours as needed. Patient not taking: Reported on 04/02/2023 01/25/23   Eustace Moore, MD  ipratropium-albuterol (DUONEB) 0.5-2.5 (3) MG/3ML SOLN Take 3 mLs by nebulization every 4 (four) hours as needed (wheeze,  SOB). Patient not taking: Reported on 04/02/2023 12/03/22   Jomarie Longs, PA-C  predniSONE (DELTASONE) 20 MG tablet Take 3 tablets for 3 days, take 2 tablets for 3 days, take 1 tablet for 3 days, take 1/2 tablet for 4 days. Patient not taking: Reported on 04/02/2023 12/03/22   Jomarie Longs, PA-C    Current Outpatient Medications  Medication Sig Dispense Refill   albuterol (VENTOLIN HFA) 108 (90 Base) MCG/ACT inhaler INHALE 2 PUFFS BY MOUTH EVERY 6 HOURS AS NEEDED 18 g 0   ARIPiprazole (ABILIFY) 5 MG tablet Take 1 tablet (5 mg total) by mouth daily. 90 tablet 1   atorvastatin (LIPITOR) 40 MG tablet Take 1 tablet (40 mg total) by mouth daily. 90 tablet 3   budesonide-formoterol (SYMBICORT) 160-4.5 MCG/ACT inhaler Inhale 2 puffs into the lungs 2 (two) times daily. 3 each 3   busPIRone (BUSPAR) 30 MG tablet Take 1 tablet (30 mg total) by mouth in the morning and at bedtime. 180 tablet 1   dicyclomine (BENTYL) 10 MG capsule Take 1 capsule (10 mg total) by mouth 2 (two) times daily. 180 capsule 3   escitalopram (LEXAPRO) 20 MG tablet Take 1 tablet by mouth once daily 90 tablet 1   hydrochlorothiazide (HYDRODIURIL) 12.5 MG tablet Take 1 tablet (12.5 mg total) by mouth daily. 90 tablet 1   lamoTRIgine (LAMICTAL) 100 MG tablet Take 1 tablet (100 mg total) by mouth 2 (two) times daily. 180 tablet 1   levothyroxine (SYNTHROID) 88 MCG tablet Take 1 tablet (88 mcg total) by mouth daily. 90 tablet 3   lisinopril (ZESTRIL) 40 MG tablet Take 1 tablet (40 mg total) by mouth daily. 90 tablet 0   clonazePAM (KLONOPIN) 0.5 MG tablet TAKE 1 TABLET BY MOUTH AS NEEDED FOR ANXIETY . DO NOT EXCEED 1 PER 24 HOURS 30 tablet 5   fluticasone (FLONASE) 50 MCG/ACT nasal spray Use 2 spray(s) in each nostril once daily (Patient not taking: Reported on 04/02/2023) 16 g 0   ibuprofen (ADVIL) 800 MG tablet Take 1 tablet (800 mg total) by mouth every 8 (eight) hours as needed. (Patient not taking: Reported on 04/02/2023) 30  tablet 0   ipratropium-albuterol (DUONEB) 0.5-2.5 (3) MG/3ML SOLN Take 3 mLs by nebulization every 4 (four) hours as needed (wheeze, SOB). (Patient not taking: Reported on 04/02/2023) 60 mL 1   predniSONE (DELTASONE) 20 MG tablet Take 3 tablets for 3 days, take 2 tablets for 3 days, take 1 tablet for 3 days, take 1/2 tablet for 4 days. (Patient not taking: Reported on 04/02/2023) 20 tablet 0   Current Facility-Administered Medications  Medication Dose Route Frequency Provider Last Rate Last Admin   0.9 %  sodium chloride infusion  500 mL Intravenous Once Imogene Burn, MD        Allergies as of 04/16/2023 - Review Complete 04/16/2023  Allergen Reaction Noted   Penicillins Hives, Swelling, Rash, and Other (See Comments) 01/31/2011  Red dye #40 (allura red) Hives 07/15/2013   Sulfa antibiotics Nausea And Vomiting and Hives 07/09/2013   Amitriptyline Other (See Comments) 06/13/2014   Macrodantin Nausea And Vomiting 01/31/2011   Nitrofurantoin Nausea And Vomiting 01/31/2011    Family History  Problem Relation Age of Onset   Colon cancer Mother    Stroke Mother    Cancer Mother        colon?   Hypertension Father    Hyperlipidemia Father    Colon polyps Neg Hx    Esophageal cancer Neg Hx    Rectal cancer Neg Hx    Stomach cancer Neg Hx     Social History   Socioeconomic History   Marital status: Married    Spouse name: Dorene Sorrow   Number of children: 1   Years of education: 14   Highest education level: Some college, no degree  Occupational History   Occupation: Radio broadcast assistant: [employer]    Comment: retired  Tobacco Use   Smoking status: Former    Current packs/day: 0.00    Average packs/day: 1.5 packs/day for 45.0 years (67.5 ttl pk-yrs)    Types: Cigarettes    Start date: 06/24/1966    Quit date: 06/25/2011    Years since quitting: 11.8   Smokeless tobacco: Never  Vaping Use   Vaping status: Some Days   Substances: Nicotine  Substance and Sexual Activity   Alcohol use:  No   Drug use: No   Sexual activity: Not Currently  Other Topics Concern   Not on file  Social History Narrative   Lives with daughter, two grand-children and her husband. She has one child. Enjoys doing crafts in her free time.   Social Determinants of Health   Financial Resource Strain: Low Risk  (10/15/2022)   Overall Financial Resource Strain (CARDIA)    Difficulty of Paying Living Expenses: Not hard at all  Food Insecurity: No Food Insecurity (10/15/2022)   Hunger Vital Sign    Worried About Running Out of Food in the Last Year: Never true    Ran Out of Food in the Last Year: Never true  Transportation Needs: No Transportation Needs (10/15/2022)   PRAPARE - Administrator, Civil Service (Medical): No    Lack of Transportation (Non-Medical): No  Physical Activity: Unknown (10/15/2022)   Exercise Vital Sign    Days of Exercise per Week: Patient declined    Minutes of Exercise per Session: Not on file  Stress: Stress Concern Present (10/15/2022)   [employer] of Occupational Health - Occupational Stress Questionnaire    Feeling of Stress : To some extent  Social Connections: Moderately Integrated (10/15/2022)   Social Connection and Isolation Panel [NHANES]    Frequency of Communication with Friends and Family: Three times a week    Frequency of Social Gatherings with Friends and Family: Twice a week    Attends Religious Services: More than 4 times per year    Active Member of [employer] or Organizations: No    Attends Engineer, structural: Not on file    Marital Status: Married  Catering manager Violence: Not At Risk (04/15/2022)   Humiliation, Afraid, Rape, and Kick questionnaire    Fear of Current or Ex-Partner: No    Emotionally Abused: No    Physically Abused: No    Sexually Abused: No    Physical Exam: Vital signs in last 24 hours: BP 132/65   Pulse 66   Temp (!) 97.4 F (  36.3 C)   Ht 5\' 3"  (1.6 m)   Wt 147 lb (66.7 kg)   LMP 06/24/2000    SpO2 97%   BMI 26.04 kg/m  GEN: NAD EYE: Sclerae anicteric ENT: MMM CV: Non-tachycardic Pulm: No increased work of breathing GI: Soft, NT/ND NEURO:  Alert & Oriented   Eulah Pont, MD [facility]  04/16/2023 11:18 AM

## 2023-04-16 NOTE — Patient Instructions (Signed)
-

## 2023-04-16 NOTE — Telephone Encounter (Signed)
Patient called back and said she needed the generic brand of the inhaler because her insurance won't pay for it

## 2023-04-16 NOTE — Progress Notes (Signed)
Called to room to assist during endoscopic procedure.  Patient ID and intended procedure confirmed with present staff. Received instructions for my participation in the procedure from the performing physician.  

## 2023-04-16 NOTE — Telephone Encounter (Signed)
Patient called she had colonoscopy done this morning her rescue inhaler was not sent back home with her she is requesting another one  Albuterol mcg/Act inhaler  Please submit to [facility] on corner of  [address] in [place] [phone]

## 2023-04-16 NOTE — Telephone Encounter (Signed)
Sent!

## 2023-04-16 NOTE — Progress Notes (Signed)
Pt's states no medical or surgical changes since previsit or office visit. 

## 2023-04-16 NOTE — Progress Notes (Signed)
1150 Ephedrine 10 mg given IV due to low BP, MD updated.

## 2023-04-16 NOTE — Telephone Encounter (Signed)
Ok sent!

## 2023-04-16 NOTE — Progress Notes (Signed)
Report given to PACU, vss 

## 2023-04-16 NOTE — Addendum Note (Signed)
Addended by: Jomarie Longs on: 04/16/2023 04:13 PM   Modules accepted: Orders

## 2023-04-16 NOTE — Op Note (Signed)
[facility] Patient Name: Angelica [name] Procedure Date: 04/16/2023 11:19 AM MRN: [id number] Endoscopist: Madelyn Brunner [place] , , [id number] Age: 72 Referring MD:  Date of Birth: 06-16-1951 Gender: Female Account #: [id number] Procedure:                Colonoscopy Indications:              Screening in patient at increased risk: Family                            history of 1st-degree relative with colorectal                            cancer Medicines:                Monitored Anesthesia Care Procedure:                Pre-Anesthesia Assessment:                           - Prior to the procedure, a History and Physical                            was performed, and patient medications and                            allergies were reviewed. The patient's tolerance of                            previous anesthesia was also reviewed. The risks                            and benefits of the procedure and the sedation                            options and risks were discussed with the patient.                            All questions were answered, and informed consent                            was obtained. Prior Anticoagulants: The patient has                            taken no anticoagulant or antiplatelet agents. ASA                            Grade Assessment: II - A patient with mild systemic                            disease. After reviewing the risks and benefits,                            the patient was deemed in satisfactory condition to  undergo the procedure.                           After obtaining informed consent, the colonoscope                            was passed under direct vision. Throughout the                            procedure, the patient's blood pressure, pulse, and                            oxygen saturations were monitored continuously. The                            CF [id number] #[id number] was introduced through the anus                             and advanced to the the terminal ileum. The                            colonoscopy was performed without difficulty. The                            patient tolerated the procedure well. The quality                            of the bowel preparation was good. The terminal                            ileum, ileocecal valve, appendiceal orifice, and                            rectum were photographed. Scope In: 11:33:40 AM Scope Out: 12:00:55 PM Scope Withdrawal Time: 0 hours 23 minutes 34 seconds  Total Procedure Duration: 0 hours 27 minutes 15 seconds  Findings:                 The terminal ileum appeared normal.                           A 2 mm polyp was found in the transverse colon. The                            polyp was sessile. The polyp was removed with a                            cold biopsy forceps. Resection and retrieval were                            complete.                           Six sessile polyps were found in the transverse  colon. The polyps were 3 to 10 mm in size. These                            polyps were removed with a cold snare. Resection                            and retrieval were complete.                           Three sessile polyps were found in the descending                            colon. The polyps were 3 to 6 mm in size. These                            polyps were removed with a cold snare. Resection                            and retrieval were complete.                           Multiple diverticula were found in the sigmoid                            colon and descending colon.                           Non-bleeding internal hemorrhoids were found during                            retroflexion. Complications:            No immediate complications. Estimated Blood Loss:     Estimated blood loss was minimal. Impression:               - The examined portion of the ileum was normal.                            - One 2 mm polyp in the transverse colon, removed                            with a cold biopsy forceps. Resected and retrieved.                           - Six 3 to 10 mm polyps in the transverse colon,                            removed with a cold snare. Resected and retrieved.                           - Three 3 to 6 mm polyps in the descending colon,                            removed with  a cold snare. Resected and retrieved.                           - Diverticulosis in the sigmoid colon and in the                            descending colon.                           - Non-bleeding internal hemorrhoids. Recommendation:           - Discharge patient to home (with escort).                           - Await pathology results.                           - The findings and recommendations were discussed                            with the patient. Dr Particia Lather "Alan Ripper" [place],  04/16/2023 12:06:34 PM

## 2023-04-17 ENCOUNTER — Telehealth: Payer: Self-pay | Admitting: *Deleted

## 2023-04-17 NOTE — Telephone Encounter (Signed)
  Follow up Call-     04/16/2023   11:01 AM  Call back number  Post procedure Call Back phone  # [phone]  Permission to leave phone message Yes     Patient questions:  Do you have a fever, pain , or abdominal swelling? No. Pain Score  0 *  Have you tolerated food without any problems? Yes.    Have you been able to return to your normal activities? Yes.    Do you have any questions about your discharge instructions: Diet   No. Medications  No. Follow up visit  No.  Do you have questions or concerns about your Care? No.  Actions: * If pain score is 4 or above: No action needed, pain <4.

## 2023-04-18 ENCOUNTER — Encounter: Payer: Self-pay | Admitting: Internal Medicine

## 2023-04-18 LAB — SURGICAL PATHOLOGY

## 2023-04-20 ENCOUNTER — Other Ambulatory Visit: Payer: Self-pay | Admitting: Physician Assistant

## 2023-04-20 DIAGNOSIS — F419 Anxiety disorder, unspecified: Secondary | ICD-10-CM

## 2023-04-21 ENCOUNTER — Ambulatory Visit (INDEPENDENT_AMBULATORY_CARE_PROVIDER_SITE_OTHER): Payer: Medicare Other | Admitting: Physician Assistant

## 2023-04-21 DIAGNOSIS — Z0000 Encounter for general adult medical examination without abnormal findings: Secondary | ICD-10-CM | POA: Diagnosis not present

## 2023-04-21 NOTE — Progress Notes (Signed)
MEDICARE ANNUAL WELLNESS VISIT  04/21/2023  Telephone Visit Disclaimer This Medicare AWV was conducted by telephone due to national recommendations for restrictions regarding the COVID-19 Pandemic (e.g. social distancing).  I verified, using two identifiers, that I am speaking with Angelica [name] or their authorized healthcare agent. I discussed the limitations, risks, security, and privacy concerns of performing an evaluation and management service by telephone and the potential availability of an in-person appointment in the future. The patient expressed understanding and agreed to proceed.  Location of Patient: Home Location of Provider (nurse):  In the office.  Subjective:    Angelica [name] is a 72 y.o. female patient of Caleen Essex, Lonna Cobb, PA-C who had a Medicare Annual Wellness Visit today via telephone. Angelica [name] is Retired and lives with their family. she has 1 child. she reports that she is socially active and does interact with friends/family regularly. she is moderately physically active and enjoys doing crafts.  Patient Care Team: Nolene Ebbs as PCP - General (Family Medicine)     04/21/2023    9:47 AM 10/20/2022    2:42 AM 04/15/2022    9:16 AM 03/25/2022   11:53 AM 12/08/2021   12:46 PM 04/13/2021   11:03 AM 12/22/2019   10:05 AM  Advanced Directives  Does Patient Have a Medical Advance Directive? Yes No Yes No No Yes Yes  Type of Advance Directive Living will  Living will   Living will Healthcare Power of [place];Living will  Does patient want to make changes to medical advance directive? No - Patient declined  No - Patient declined   No - Patient declined No - Patient declined  Copy of Healthcare Power of Attorney in Chart?       No - copy requested  Would patient like information on creating a medical advance directive?    Yes (MAU/Ambulatory/Procedural Areas - Information given)       Hospital Utilization Over the Past 12 Months: # of hospitalizations or  ER visits: 2 # of surgeries: 0  Review of Systems    Patient reports that her overall health is unchanged compared to last year.  History obtained from chart review and the patient  Patient Reported Readings (BP, Pulse, CBG, Weight, etc) none Per patient no change in vitals since last visit, unable to obtain new vitals due to telehealth visit  Pain Assessment Pain : No/denies pain     Current Medications & Allergies (verified) Allergies as of 04/21/2023       Reactions   Penicillins Hives, Swelling, Rash, Other (See Comments)   PATIENT HAS HAD A PCN REACTION WITH IMMEDIATE RASH, FACIAL/TONGUE/THROAT SWELLING, SOB, OR LIGHTHEADEDNESS WITH HYPOTENSION:  #  #  YES  #  # Has patient had a PCN reaction causing severe rash involving mucus membranes or skin necrosis: NO PATIENT HAS HAD A PCN REACTION THAT REQUIRED HOSPITALIZATION:  #  #  YES  #  #  Has patient had a PCN reaction occurring within the last 10 years: NO   Red Dye #40 (allura Red) Hives   Sulfa Antibiotics Nausea And Vomiting, Hives   Amitriptyline Other (See Comments)   Crazy dreams    Macrodantin Nausea And Vomiting   Nitrofurantoin Nausea And Vomiting        Medication List        Accurate as of April 21, 2023 10:05 AM. If you have any questions, ask your nurse or doctor.  albuterol 108 (90 Base) MCG/ACT inhaler Commonly known as: VENTOLIN HFA Inhale 2 puffs into the lungs every 6 (six) hours as needed.   albuterol 108 (90 Base) MCG/ACT inhaler Commonly known as: VENTOLIN HFA Inhale 2 puffs into the lungs every 6 (six) hours as needed for wheezing.   ARIPiprazole 5 MG tablet Commonly known as: Abilify Take 1 tablet (5 mg total) by mouth daily.   atorvastatin 40 MG tablet Commonly known as: LIPITOR Take 1 tablet (40 mg total) by mouth daily.   budesonide-formoterol 160-4.5 MCG/ACT inhaler Commonly known as: SYMBICORT Inhale 2 puffs into the lungs 2 (two) times daily.   busPIRone 30  MG tablet Commonly known as: BUSPAR Take 1 tablet (30 mg total) by mouth in the morning and at bedtime.   clonazePAM 0.5 MG tablet Commonly known as: KLONOPIN TAKE 1 TABLET BY MOUTH AS NEEDED FOR ANXIETY . DO NOT EXCEED 1 PER 24 HOURS   dicyclomine 10 MG capsule Commonly known as: BENTYL Take 1 capsule (10 mg total) by mouth 2 (two) times daily.   escitalopram 20 MG tablet Commonly known as: LEXAPRO Take 1 tablet by mouth once daily   fluticasone 50 MCG/ACT nasal spray Commonly known as: FLONASE Use 2 spray(s) in each nostril once daily   hydrochlorothiazide 12.5 MG tablet Commonly known as: HYDRODIURIL Take 1 tablet (12.5 mg total) by mouth daily.   ibuprofen 800 MG tablet Commonly known as: ADVIL Take 1 tablet (800 mg total) by mouth every 8 (eight) hours as needed.   ipratropium-albuterol 0.5-2.5 (3) MG/3ML Soln Commonly known as: DUONEB Take 3 mLs by nebulization every 4 (four) hours as needed (wheeze, SOB).   lamoTRIgine 100 MG tablet Commonly known as: LaMICtal Take 1 tablet (100 mg total) by mouth 2 (two) times daily.   levothyroxine 88 MCG tablet Commonly known as: SYNTHROID Take 1 tablet (88 mcg total) by mouth daily.   lisinopril 40 MG tablet Commonly known as: ZESTRIL Take 1 tablet (40 mg total) by mouth daily.   predniSONE 20 MG tablet Commonly known as: DELTASONE Take 3 tablets for 3 days, take 2 tablets for 3 days, take 1 tablet for 3 days, take 1/2 tablet for 4 days.        History (reviewed): Past Medical History:  Diagnosis Date   Allergy    Anxiety    Asthma    Breast cancer (HCC) 2009   Depression    Dyspnea    with heavy pollen    Hypertension    Hypothyroidism    Postmenopausal    Thyroid disease    Past Surgical History:  Procedure Laterality Date   ADENOIDECTOMY     APPENDECTOMY     BREAST RECONSTRUCTION  06/2008   CHOLECYSTECTOMY N/A 11/12/2017   Procedure: LAPAROSCOPIC CHOLECYSTECTOMY WITH INTRAOPERATIVE CHOLANGIOGRAM  ERAS PATHWAY;  Surgeon: Harriette Bouillon, MD;  Location: [facility] OR;  Service: General;  Laterality: N/A;   MASTECTOMY  2009   [facility]   TONSILLECTOMY     Family History  Problem Relation Age of Onset   Colon cancer Mother    Stroke Mother    Cancer Mother        colon?   Hypertension Father    Hyperlipidemia Father    Colon polyps Neg Hx    Esophageal cancer Neg Hx    Rectal cancer Neg Hx    Stomach cancer Neg Hx    Social History   Socioeconomic History   Marital status: Married    Spouse  name: Dorene Sorrow   Number of children: 1   Years of education: 14   Highest education level: Some college, no degree  Occupational History   Occupation: Radio broadcast assistant: [employer]    Comment: retired  Tobacco Use   Smoking status: Former    Current packs/day: 0.00    Average packs/day: 1.5 packs/day for 45.0 years (67.5 ttl pk-yrs)    Types: Cigarettes    Start date: 06/24/1966    Quit date: 06/25/2011    Years since quitting: 11.8   Smokeless tobacco: Never  Vaping Use   Vaping status: Some Days   Substances: Nicotine  Substance and Sexual Activity   Alcohol use: No   Drug use: No   Sexual activity: Not Currently  Other Topics Concern   Not on file  Social History Narrative   Lives with daughter, two grand-children and her husband. She has one child. Enjoys doing crafts in her free time.   Social Determinants of Health   Financial Resource Strain: Low Risk  (04/21/2023)   Overall Financial Resource Strain (CARDIA)    Difficulty of Paying Living Expenses: Not hard at all  Food Insecurity: No Food Insecurity (04/21/2023)   Hunger Vital Sign    Worried About Running Out of Food in the Last Year: Never true    Ran Out of Food in the Last Year: Never true  Transportation Needs: No Transportation Needs (04/21/2023)   PRAPARE - Administrator, Civil Service (Medical): No    Lack of Transportation (Non-Medical): No  Physical Activity: Insufficiently Active (04/21/2023)   Exercise  Vital Sign    Days of Exercise per Week: 3 days    Minutes of Exercise per Session: 20 min  Stress: No Stress Concern Present (04/21/2023)   [employer] of Occupational Health - Occupational Stress Questionnaire    Feeling of Stress : Not at all  Social Connections: Moderately Integrated (04/21/2023)   Social Connection and Isolation Panel [NHANES]    Frequency of Communication with Friends and Family: Three times a week    Frequency of Social Gatherings with Friends and Family: Twice a week    Attends Religious Services: More than 4 times per year    Active Member of [employer] or Organizations: No    Attends Banker Meetings: Never    Marital Status: Married    Activities of Daily Living    04/21/2023    9:53 AM  In your present state of health, do you have any difficulty performing the following activities:  Hearing? 0  Vision? 0  Difficulty concentrating or making decisions? 0  Walking or climbing stairs? 0  Dressing or bathing? 0  Doing errands, shopping? 0  Preparing Food and eating ? N  Using the Toilet? N  In the past six months, have you accidently leaked urine? N  Do you have problems with loss of bowel control? N  Managing your Medications? N  Managing your Finances? N  Housekeeping or managing your Housekeeping? N    Patient Education/ Literacy How often do you need to have someone help you when you read instructions, pamphlets, or other written materials from your doctor or pharmacy?: 1 - Never What is the last grade level you completed in school?: 12th grade  Exercise    Diet Patient reports consuming 2 meals a day and 2 snack(s) a day Patient reports that her primary diet is: Regular Patient reports that she does have regular access to food.  Depression Screen    04/21/2023    9:48 AM 02/14/2023   10:35 AM 12/03/2022   10:29 AM 11/12/2022   11:07 AM 10/15/2022   10:11 AM 08/16/2022   10:18 AM 04/15/2022    9:17 AM  PHQ 2/9 Scores   PHQ - 2 Score 0 0 0 0 4 0 2  PHQ- 9 Score    1 10 1 11      Fall Risk    04/21/2023    9:48 AM 12/03/2022   10:28 AM 08/16/2022   10:18 AM 04/15/2022    9:17 AM 04/12/2022    9:53 AM  Fall Risk   Falls in the past year? 1 1 0 1 1  Number falls in past yr: 1 1 0 1 1  Injury with Fall? 1 1 0 1 1  Risk for fall due to : History of fall(s);Impaired balance/gait History of fall(s) No Fall Risks History of fall(s) History of fall(s)  Follow up Falls evaluation completed;Education provided;Falls prevention discussed Falls evaluation completed Falls evaluation completed Falls evaluation completed;Education provided;Falls prevention discussed Falls evaluation completed     Objective:  CYNCERE MOREA seemed alert and oriented and she participated appropriately during our telephone visit.  Blood Pressure Weight BMI  BP Readings from Last 3 Encounters:  04/16/23 112/61  02/14/23 (!) 131/45  01/25/23 132/62   Wt Readings from Last 3 Encounters:  04/16/23 147 lb (66.7 kg)  04/02/23 147 lb (66.7 kg)  02/14/23 152 lb 1.3 oz (69 kg)   BMI Readings from Last 1 Encounters:  04/16/23 26.04 kg/m    *Unable to obtain current vital signs, weight, and BMI due to telephone visit type  Hearing/Vision  Angelica [name] did not seem to have difficulty with hearing/understanding during the telephone conversation Reports that she has had a formal eye exam by an eye care professional within the past year Reports that she has not had a formal hearing evaluation within the past year *Unable to fully assess hearing and vision during telephone visit type  Cognitive Function:    04/21/2023    9:58 AM 04/15/2022    9:25 AM 04/13/2021   11:14 AM 12/22/2019   10:09 AM 12/21/2018    1:16 PM  6CIT Screen  What Year? 0 points 0 points 0 points 0 points 0 points  What month? 0 points 0 points 0 points 0 points 0 points  What time? 0 points 0 points 0 points 0 points 0 points  Count back from 20 0 points 0 points 0  points 0 points 0 points  Months in reverse 0 points 0 points 0 points 0 points 0 points  Repeat phrase 2 points 0 points 0 points 0 points 0 points  Total Score 2 points 0 points 0 points 0 points 0 points   (Normal:0-7, Significant for Dysfunction: >8)  Normal Cognitive Function Screening: Yes   Immunization & Health Maintenance Record Immunization History  Administered Date(s) Administered   Fluad Quad(high Dose 65+) 04/17/2021, 04/12/2022   Influenza Split 06/24/2012   Influenza Whole 03/29/2009   Influenza, High Dose Seasonal PF 02/26/2017, 04/02/2018, 02/14/2023   Influenza,inj,Quad PF,6+ Mos 03/14/2014, 03/08/2015, 05/14/2016, 03/02/2019   Influenza,trivalent, recombinat, inj, PF 05/29/2012   Influenza-Unspecified 05/29/2012, 02/26/2017, 04/02/2018, 04/02/2020   Moderna Sars-Covid-2 Vaccination 08/07/2019, 09/04/2019, 05/27/2020   Pfizer Covid-19 Vaccine Bivalent Booster 1yrs & up 05/16/2021   Pfizer(Comirnaty)Fall Seasonal Vaccine 12 years and older 08/16/2022   Pneumococcal Conjugate-13 02/26/2017   Pneumococcal Polysaccharide-23 05/29/2012, 06/24/2012, 08/31/2018  Pneumococcal-Unspecified 02/26/2017, 04/02/2020   Respiratory Syncytial Virus Vaccine,Recomb Aduvanted(Arexvy) 10/01/2022   Tdap 01/31/2011, 10/01/2022   Zoster Recombinant(Shingrix) 10/01/2022, 12/10/2022   Zoster, Live 01/31/2011    Health Maintenance  Topic Date Due   COVID-19 Vaccine (6 - 2023-24 season) 05/07/2023 (Originally 02/23/2023)   Lung Cancer Screening  08/17/2023 (Originally 08/04/2013)   Medicare Annual Wellness (AWV)  04/20/2024   DEXA SCAN  05/09/2024   MAMMOGRAM  06/06/2024   Colonoscopy  04/15/2028   Pneumonia Vaccine 7+ Years old  Completed   INFLUENZA VACCINE  Completed   Hepatitis C Screening  Completed   Zoster Vaccines- Shingrix  Completed   HPV VACCINES  Aged Out   DTaP/Tdap/Td  Discontinued       Assessment  This is a routine wellness examination for Angelica [name].  Health Maintenance: Due or Overdue There are no preventive care reminders to display for this patient.   Angelica [name] does not need a referral for Community Assistance: Care Management:   no Social Work:    no Prescription Assistance:  no Nutrition/Diabetes Education:  no   Plan:  Personalized Goals  Goals Addressed               This Visit's Progress     Patient Stated (pt-stated)        Patient stated that she would like to work on weight loss.       Personalized Health Maintenance & Screening Recommendations  Screening mammography - patient stated that she will get it scheduled.  Lung Cancer Screening Recommended: yes; patient declined (Low Dose CT Chest recommended if Age 10-80 years, 20 pack-year currently smoking OR have quit w/in past 15 years) Hepatitis C Screening recommended: no HIV Screening recommended: no  Advanced Directives: Written information was not prepared per patient's request.  Referrals & Orders No orders of the defined types were placed in this encounter.   Follow-up Plan Follow-up with Jomarie Longs, PA-C as planned Medicare wellness visit in one year.  Patient will access AVS on my chart.   I have personally reviewed and noted the following in the patient's chart:   Medical and social history Use of alcohol, tobacco or illicit drugs  Current medications and supplements Functional ability and status Nutritional status Physical activity Advanced directives List of other physicians Hospitalizations, surgeries, and ER visits in previous 12 months Vitals Screenings to include cognitive, depression, and falls Referrals and appointments  In addition, I have reviewed and discussed with Angelica [name] certain preventive protocols, quality metrics, and best practice recommendations. A written personalized care plan for preventive services as well as general preventive health recommendations is available and can be mailed to  the patient at her request.      Modesto Charon, RN BSN  04/21/2023

## 2023-04-21 NOTE — Patient Instructions (Addendum)
MEDICARE ANNUAL WELLNESS VISIT Health Maintenance Summary and Written Plan of Care  [name] ,  Thank you for allowing me to perform your Medicare Annual Wellness Visit and for your ongoing commitment to your health.   Health Maintenance & Immunization History Health Maintenance  Topic Date Due  . COVID-19 Vaccine (6 - 2023-24 season) 05/07/2023 (Originally 02/23/2023)  . Lung Cancer Screening  08/17/2023 (Originally 08/04/2013)  . Medicare Annual Wellness (AWV)  04/20/2024  . DEXA SCAN  05/09/2024  . MAMMOGRAM  06/06/2024  . Colonoscopy  04/15/2028  . Pneumonia Vaccine 47+ Years old  Completed  . INFLUENZA VACCINE  Completed  . Hepatitis C Screening  Completed  . Zoster Vaccines- Shingrix  Completed  . HPV VACCINES  Aged Out  . DTaP/Tdap/Td  Discontinued   Immunization History  Administered Date(s) Administered  . Fluad Quad(high Dose 65+) 04/17/2021, 04/12/2022  . Influenza Split 06/24/2012  . Influenza Whole 03/29/2009  . Influenza, High Dose Seasonal PF 02/26/2017, 04/02/2018, 02/14/2023  . Influenza,inj,Quad PF,6+ Mos 03/14/2014, 03/08/2015, 05/14/2016, 03/02/2019  . Influenza,trivalent, recombinat, inj, PF 05/29/2012  . Influenza-Unspecified 05/29/2012, 02/26/2017, 04/02/2018, 04/02/2020  . Moderna Sars-Covid-2 Vaccination 08/07/2019, 09/04/2019, 05/27/2020  . Research officer, trade union 34yrs & up 05/16/2021  . Pfizer(Comirnaty)Fall Seasonal Vaccine 12 years and older 08/16/2022  . Pneumococcal Conjugate-13 02/26/2017  . Pneumococcal Polysaccharide-23 05/29/2012, 06/24/2012, 08/31/2018  . Pneumococcal-Unspecified 02/26/2017, 04/02/2020  . Respiratory Syncytial Virus Vaccine,Recomb Aduvanted(Arexvy) 10/01/2022  . Tdap 01/31/2011, 10/01/2022  . Zoster Recombinant(Shingrix) 10/01/2022, 12/10/2022  . Zoster, Live 01/31/2011    These are the patient goals that we discussed:  Goals Addressed              This Visit's Progress   .  Patient Stated  (pt-stated)        Patient stated that she would like to work on weight loss.        This is a list of Health Maintenance Items that are overdue or due now: Screening mammography - patient stated that she will get it scheduled. Lung cancer screening- patient declined at this time.  Orders/Referrals Placed Today: No orders of the defined types were placed in this encounter.  (Contact our referral department at [phone] if you have not spoken with someone about your referral appointment within the next 5 days)    Follow-up Plan Follow-up with Jomarie Longs, PA-C as planned Medicare wellness visit in one year.  Patient will access AVS on my chart.      Health Maintenance, Female Adopting a healthy lifestyle and getting preventive care are important in promoting health and wellness. Ask your health care provider about: The right schedule for you to have regular tests and exams. Things you can do on your own to prevent diseases and keep yourself healthy. What should I know about diet, weight, and exercise? Eat a healthy diet  Eat a diet that includes plenty of vegetables, fruits, low-fat dairy products, and lean protein. Do not eat a lot of foods that are high in solid fats, added sugars, or sodium. Maintain a healthy weight Body mass index (BMI) is used to identify weight problems. It estimates body fat based on height and weight. Your health care provider can help determine your BMI and help you achieve or maintain a healthy weight. Get regular exercise Get regular exercise. This is one of the most important things you can do for your health. Most adults should: Exercise for at least 150 minutes each week. The exercise should  increase your heart rate and make you sweat (moderate-intensity exercise). Do strengthening exercises at least twice a week. This is in addition to the moderate-intensity exercise. Spend less time sitting. Even light physical activity can be  beneficial. Watch cholesterol and blood lipids Have your blood tested for lipids and cholesterol at 72 years of age, then have this test every 5 years. Have your cholesterol levels checked more often if: Your lipid or cholesterol levels are high. You are older than 72 years of age. You are at high risk for heart disease. What should I know about cancer screening? Depending on your health history and family history, you may need to have cancer screening at various ages. This may include screening for: Breast cancer. Cervical cancer. Colorectal cancer. Skin cancer. Lung cancer. What should I know about heart disease, diabetes, and high blood pressure? Blood pressure and heart disease High blood pressure causes heart disease and increases the risk of stroke. This is more likely to develop in people who have high blood pressure readings or are overweight. Have your blood pressure checked: Every 3-5 years if you are 13-28 years of age. Every year if you are 54 years old or older. Diabetes Have regular diabetes screenings. This checks your fasting blood sugar level. Have the screening done: Once every three years after age 41 if you are at a normal weight and have a low risk for diabetes. More often and at a younger age if you are overweight or have a high risk for diabetes. What should I know about preventing infection? Hepatitis B If you have a higher risk for hepatitis B, you should be screened for this virus. Talk with your health care provider to find out if you are at risk for hepatitis B infection. Hepatitis C Testing is recommended for: Everyone born from [birth year] through 1965. Anyone with known risk factors for hepatitis C. Sexually transmitted infections (STIs) Get screened for STIs, including gonorrhea and chlamydia, if: You are sexually active and are younger than 72 years of age. You are older than 72 years of age and your health care provider tells you that you are at risk for  this type of infection. Your sexual activity has changed since you were last screened, and you are at increased risk for chlamydia or gonorrhea. Ask your health care provider if you are at risk. Ask your health care provider about whether you are at high risk for HIV. Your health care provider may recommend a prescription medicine to help prevent HIV infection. If you choose to take medicine to prevent HIV, you should first get tested for HIV. You should then be tested every 3 months for as long as you are taking the medicine. Pregnancy If you are about to stop having your period (premenopausal) and you may become pregnant, seek counseling before you get pregnant. Take 400 to 800 micrograms (mcg) of folic acid every day if you become pregnant. Ask for birth control (contraception) if you want to prevent pregnancy. Osteoporosis and menopause Osteoporosis is a disease in which the bones lose minerals and strength with aging. This can result in bone fractures. If you are 45 years old or older, or if you are at risk for osteoporosis and fractures, ask your health care provider if you should: Be screened for bone loss. Take a calcium or vitamin D supplement to lower your risk of fractures. Be given hormone replacement therapy (HRT) to treat symptoms of menopause. Follow these instructions at home: Alcohol use Do not  drink alcohol if: Your health care provider tells you not to drink. You are pregnant, may be pregnant, or are planning to become pregnant. If you drink alcohol: Limit how much you have to: 0-1 drink a day. Know how much alcohol is in your drink. In the [place] one drink equals one 12 oz bottle of beer (355 mL), one 5 oz glass of wine (148 mL), or one 1 oz glass of hard liquor (44 mL). Lifestyle Do not use any products that contain nicotine or tobacco. These products include cigarettes, chewing tobacco, and vaping devices, such as e-cigarettes. If you need help quitting, ask your health  care provider. Do not use street drugs. Do not share needles. Ask your health care provider for help if you need support or information about quitting drugs. General instructions Schedule regular health, dental, and eye exams. Stay current with your vaccines. Tell your health care provider if: You often feel depressed. You have ever been abused or do not feel safe at home. Summary Adopting a healthy lifestyle and getting preventive care are important in promoting health and wellness. Follow your health care provider's instructions about healthy diet, exercising, and getting tested or screened for diseases. Follow your health care provider's instructions on monitoring your cholesterol and blood pressure. This information is not intended to replace advice given to you by your health care provider. Make sure you discuss any questions you have with your health care provider. Document Revised: 10/30/2020 Document Reviewed: 10/30/2020 Elsevier Patient Education  2024 [employer].

## 2023-05-06 ENCOUNTER — Telehealth: Payer: Self-pay | Admitting: Physician Assistant

## 2023-05-06 NOTE — Telephone Encounter (Signed)
Patient called she thinks that she is having a reaction to ABILIFY 5mg  she says her lips and side of her face quiver please advise

## 2023-05-07 ENCOUNTER — Telehealth: Payer: Self-pay | Admitting: Physician Assistant

## 2023-05-07 DIAGNOSIS — F419 Anxiety disorder, unspecified: Secondary | ICD-10-CM

## 2023-05-07 DIAGNOSIS — F439 Reaction to severe stress, unspecified: Secondary | ICD-10-CM

## 2023-05-07 MED ORDER — BUSPIRONE HCL 30 MG PO TABS: 30.0000 mg | ORAL_TABLET | Freq: Two times a day (BID) | ORAL | 1 refills | Status: DC

## 2023-05-07 NOTE — Telephone Encounter (Signed)
Sent!

## 2023-05-07 NOTE — Telephone Encounter (Signed)
Pt called. [facility] [address] [facility] does not have rx for Buspar(she recently transferred meds to this pharmacy),

## 2023-05-09 ENCOUNTER — Encounter: Payer: Self-pay | Admitting: Physician Assistant

## 2023-05-09 ENCOUNTER — Ambulatory Visit (INDEPENDENT_AMBULATORY_CARE_PROVIDER_SITE_OTHER): Payer: Medicare Other | Admitting: Physician Assistant

## 2023-05-09 VITALS — BP 122/60 | HR 71 | Ht 63.0 in | Wt 148.8 lb

## 2023-05-09 DIAGNOSIS — G2401 Drug induced subacute dyskinesia: Secondary | ICD-10-CM

## 2023-05-09 DIAGNOSIS — G514 Facial myokymia: Secondary | ICD-10-CM

## 2023-05-09 DIAGNOSIS — T887XXA Unspecified adverse effect of drug or medicament, initial encounter: Secondary | ICD-10-CM

## 2023-05-09 NOTE — Patient Instructions (Addendum)
Start magnesium glycinate 400mg  at bedtime  Tardive Dyskinesia Tardive dyskinesia is a disorder that causes uncontrollable body movements. It occurs in some people who are taking certain medicines to treat a mental illness (neuroleptic medicine) or have taken this type of medicine in the past. These medicines block the effects of a specific brain chemical called dopamine.  Sometimes, tardive dyskinesia starts months or years after someone took the medicine. Not everyone who takes a neuroleptic medicine will get tardive dyskinesia. What are the causes? This condition is caused by changes in your brain that are associated with taking a neuroleptic medicine. What increases the risk? If you are taking a neuroleptic medicine, your risk for tardive dyskinesia may be higher if: You are taking an older type of neuroleptic medicine. You have been taking the medicine for a long time at a high dose. You are a woman past the age of menopause. You are older than 60 years. You have a history of alcohol or drug abuse. What are the signs or symptoms? Abnormal, uncontrollable movements are the main symptom of tardive dyskinesia. These types of movements may include: Grimacing. Sticking out or twisting your tongue. Making chewing or sucking sounds. Blinking your eyes. Twisting, swaying, or thrusting your body. Foot tapping or finger waving. Rapid movements of your arms or legs. How is this diagnosed? Your health care provider may suspect that you have tardive dyskinesia if: You have been taking neuroleptic medicines. You have abnormal movements that you cannot control. If you are taking a medicine that can cause tardive dyskinesia, your health care provider may screen you for early signs of the condition. This may include: Observing your body movements. Using a specific rating scale called the Abnormal Involuntary Movement Scale (AIMS). You may also have tests to rule out other conditions that cause  abnormal body movements, including: Parkinson's disease. Huntington's disease. Stroke. How is this treated? The best treatment for tardive dyskinesia is to lower the dose of your medicine or to switch to a different medicine at the first sign of abnormal and uncontrolled movements. There is no cure for long-term (chronic) tardive dyskinesia. Some medicines may help control the movements. These include: Clozapine, a medicine used to treat mental illness (antipsychotic). Some muscle relaxants. Some anti-seizure medicines. Some medicines used to treat high blood pressure. Some tranquilizers (sedatives). Follow these instructions at home:     Take over-the-counter and prescription medicines only as told by your health care provider. Do not stop or start taking any medicines without talking to your health care provider first. Do not abuse drugs or alcohol. Keep all follow-up visits. This is important. Contact a health care provider if: You are unable to eat or drink. You have had a fall. Your symptoms get worse. Summary Tardive dyskinesia is a disorder that causes uncontrollable body movements. These may include grimacing, sticking out or twisting your tongue, blinking your eyes, or rapid movements of your arms or legs. The condition occurs in some people who are taking certain medicines to treat a mental illness or have taken this type of medicine in the past. The best treatment for tardive dyskinesia is to lower the dose of your medicine or to switch to a different medicine at the first sign of abnormal and uncontrolled movements. There is no cure for long-term (chronic) tardive dyskinesia, but some medicines may help control the movements. This information is not intended to replace advice given to you by your health care provider. Make sure you discuss any questions you have  with your health care provider. Document Revised: 05/06/2021 Document Reviewed: 05/06/2021 Elsevier Patient  Education  2024 [employer].

## 2023-05-09 NOTE — Progress Notes (Unsigned)
   Established Patient Office Visit  Subjective   Patient ID: Angelica [name], female    DOB: 03-18-1951  Age: 72 y.o. MRN: [id number]  Chief Complaint  Patient presents with   Allergic Reaction    Pt states she thinks that her lip and mouth quivering may be coming from Abilify     Allergic Reaction Pertinent negatives include no diarrhea or vomiting.   Patient presents today for bottom lip and cheek quivering starting around April 2024. She thinks it is from Abilify which she started in April 2024. She stopped taking it about 1 wk ago and has noticed that the cheek quivering has stopped, but the lip quivering has continued. The quivering is constant. After stopping Abilify, she has not noticed an change in her mood.   Review of Systems  Constitutional:  Positive for malaise/fatigue. Negative for chills and fever.  Gastrointestinal:  Negative for constipation, diarrhea, heartburn, nausea and vomiting.  Neurological:  Negative for dizziness, tingling, sensory change, weakness and headaches.  Psychiatric/Behavioral:  Negative for depression. The patient is not nervous/anxious.       Objective:     BP 122/60 (BP Location: Left Arm)   Pulse 71   Ht 5\' 3"  (1.6 m)   Wt 67.5 kg   LMP 06/24/2000   SpO2 97%   BMI 26.35 kg/m  BP Readings from Last 3 Encounters:  05/09/23 122/60  04/16/23 112/61  02/14/23 (!) 131/45      Physical Exam HENT:     Mouth/Throat:     Comments: Entire bottom lip twitching. Twitching stops when opens mouth. Cardiovascular:     Rate and Rhythm: Normal rate and regular rhythm.     Pulses: Normal pulses.     Heart sounds: Normal heart sounds.  Pulmonary:     Effort: Pulmonary effort is normal.     Breath sounds: Normal breath sounds.      No results found for any visits on 05/09/23.  Last metabolic panel Lab Results  Component Value Date   GLUCOSE 96 12/08/2021   NA 140 12/08/2021   K 3.7 12/08/2021   CL 105 12/08/2021   CO2 29 12/08/2021    BUN 11 12/08/2021   CREATININE 0.90 12/08/2021   GFRNONAA >60 12/08/2021   CALCIUM 9.0 12/08/2021   PROT 6.6 12/08/2021   ALBUMIN 3.8 12/08/2021   BILITOT 0.7 12/08/2021   ALKPHOS 103 12/08/2021   AST 25 12/08/2021   ALT 21 12/08/2021   ANIONGAP 6 12/08/2021      The 10-year ASCVD risk score (Arnett DK, et al., 2019) is: 14%    Assessment & Plan:   Problem List Items Addressed This Visit   None Visit Diagnoses     Medication side effect    -  Primary   Relevant Orders   CMP14+EGFR   TSH   CBC w/Diff/Platelet   Facial twitching       Relevant Orders   CMP14+EGFR   TSH   CBC w/Diff/Platelet      D/c Abilify.  Educated patient on tardive dyskinesia and provided handouts.  Discussed with pt that it may take about 1 month for Abilify to exit her system and for symptoms to improve. Advised patient to take Magnesium supplement. Obtained CMP, CBC, and TSH to rule out other etiologies.   BP to goal on lisinopril and hydrochlorothiazide. Continue BP medications.   Return in about 4 weeks (around 06/06/2023).    AnnaCollin M Mace, Student-PA

## 2023-05-10 LAB — CBC WITH DIFFERENTIAL/PLATELET
Basophils Absolute: 0.1 10*3/uL (ref 0.0–0.2)
Basos: 1 %
EOS (ABSOLUTE): 0.2 10*3/uL (ref 0.0–0.4)
Eos: 3 %
Hematocrit: 36.8 % (ref 34.0–46.6)
Hemoglobin: 12.3 g/dL (ref 11.1–15.9)
Immature Grans (Abs): 0 10*3/uL (ref 0.0–0.1)
Immature Granulocytes: 0 %
Lymphocytes Absolute: 3.1 10*3/uL (ref 0.7–3.1)
Lymphs: 42 %
MCH: 28.9 pg (ref 26.6–33.0)
MCHC: 33.4 g/dL (ref 31.5–35.7)
MCV: 87 fL (ref 79–97)
Monocytes Absolute: 0.6 10*3/uL (ref 0.1–0.9)
Monocytes: 7 %
Neutrophils Absolute: 3.5 10*3/uL (ref 1.4–7.0)
Neutrophils: 47 %
Platelets: 223 10*3/uL (ref 150–450)
RBC: 4.25 x10E6/uL (ref 3.77–5.28)
RDW: 12.6 % (ref 11.7–15.4)
WBC: 7.5 10*3/uL (ref 3.4–10.8)

## 2023-05-10 LAB — CMP14+EGFR
ALT: 8 [IU]/L (ref 0–32)
AST: 13 [IU]/L (ref 0–40)
Albumin: 4.5 g/dL (ref 3.8–4.8)
Alkaline Phosphatase: 113 [IU]/L (ref 44–121)
BUN/Creatinine Ratio: 12 (ref 12–28)
BUN: 12 mg/dL (ref 8–27)
Bilirubin Total: 0.6 mg/dL (ref 0.0–1.2)
CO2: 26 mmol/L (ref 20–29)
Calcium: 9.5 mg/dL (ref 8.7–10.3)
Chloride: 101 mmol/L (ref 96–106)
Creatinine, Ser: 1.04 mg/dL — ABNORMAL HIGH (ref 0.57–1.00)
Globulin, Total: 1.9 g/dL (ref 1.5–4.5)
Glucose: 84 mg/dL (ref 70–99)
Potassium: 4 mmol/L (ref 3.5–5.2)
Sodium: 141 mmol/L (ref 134–144)
Total Protein: 6.4 g/dL (ref 6.0–8.5)
eGFR: 57 mL/min/{1.73_m2} — ABNORMAL LOW (ref >59)

## 2023-05-10 LAB — TSH: TSH: 0.194 u[IU]/mL — ABNORMAL LOW (ref 0.450–4.500)

## 2023-05-12 ENCOUNTER — Other Ambulatory Visit: Payer: Self-pay | Admitting: Physician Assistant

## 2023-05-12 ENCOUNTER — Encounter: Payer: Self-pay | Admitting: Physician Assistant

## 2023-05-12 DIAGNOSIS — T887XXA Unspecified adverse effect of drug or medicament, initial encounter: Secondary | ICD-10-CM | POA: Insufficient documentation

## 2023-05-12 DIAGNOSIS — G2401 Drug induced subacute dyskinesia: Secondary | ICD-10-CM | POA: Insufficient documentation

## 2023-05-12 DIAGNOSIS — G514 Facial myokymia: Secondary | ICD-10-CM | POA: Insufficient documentation

## 2023-05-12 MED ORDER — LEVOTHYROXINE SODIUM 75 MCG PO TABS: 75.0000 ug | ORAL_TABLET | Freq: Every day | ORAL | 0 refills | Status: DC

## 2023-05-12 NOTE — Progress Notes (Signed)
Angelica [name],   Kidney function dropped just a little. Will watch this.  TSH level is too low. Meaning your active thyroid levels are on the high side. This can cause muscle contractions as well.  We need to decrease your thyroid medication and recheck in 4-6 weeks.   Decreased to and sent to pharmacy. Recheck BMP and TSH and Free T4 in 4 weeks.

## 2023-05-15 NOTE — Telephone Encounter (Signed)
Patient seen in office

## 2023-06-11 ENCOUNTER — Other Ambulatory Visit: Payer: Self-pay | Admitting: Physician Assistant

## 2023-06-16 ENCOUNTER — Ambulatory Visit (INDEPENDENT_AMBULATORY_CARE_PROVIDER_SITE_OTHER): Payer: Medicare Other | Admitting: Physician Assistant

## 2023-06-16 VITALS — BP 117/53 | HR 78 | Temp 98.0°F | Ht 63.0 in | Wt 143.0 lb

## 2023-06-16 DIAGNOSIS — F32A Depression, unspecified: Secondary | ICD-10-CM | POA: Diagnosis not present

## 2023-06-16 DIAGNOSIS — F419 Anxiety disorder, unspecified: Secondary | ICD-10-CM | POA: Diagnosis not present

## 2023-06-16 DIAGNOSIS — E890 Postprocedural hypothyroidism: Secondary | ICD-10-CM

## 2023-06-16 DIAGNOSIS — R944 Abnormal results of kidney function studies: Secondary | ICD-10-CM | POA: Insufficient documentation

## 2023-06-16 DIAGNOSIS — I10 Essential (primary) hypertension: Secondary | ICD-10-CM

## 2023-06-16 DIAGNOSIS — G2401 Drug induced subacute dyskinesia: Secondary | ICD-10-CM

## 2023-06-16 MED ORDER — LISINOPRIL 40 MG PO TABS: 40.0000 mg | ORAL_TABLET | Freq: Every day | ORAL | 1 refills | Status: DC

## 2023-06-16 MED ORDER — CLONAZEPAM 0.5 MG PO TABS: ORAL_TABLET | ORAL | 5 refills | Status: DC

## 2023-06-16 NOTE — Progress Notes (Signed)
Established Patient Office Visit  Subjective   Patient ID: Angelica [name], female    DOB: [birth year]  Age: 72 y.o. MRN: [id number]  Chief Complaint  Patient presents with   Medical Management of Chronic Issues    4 week fup on twitch    HPI Pt is a 72 yo female who presents to the clinic to follow up Tardive dyskinesia from Abilify. It has completely resolved after stopping medication.   She concerned about her decrease in kidney function and here to recheck.   She has taken a lower dose of thyroid medication due to over supplementation and needs rechecked today as well.   She is feeling much better. Mood is good.   .. Active Ambulatory Problems    Diagnosis Date Noted   Breast cancer (HCC) 01/31/2011   GAD (generalized anxiety disorder) 01/31/2011   Asthma 01/31/2011   Seasonal allergies 01/31/2011   Hypothyroid 01/31/2011   Episodic mood disorder (HCC) 10/17/2012   Anxiety and depression 08/03/2013   Chondromalacia of right patellofemoral joint 02/10/2014   Insomnia 03/15/2014   IBS (irritable bowel syndrome) 08/22/2014   Dyslipidemia (high LDL; low HDL) 03/09/2015   Elevated blood pressure 04/26/2015   Trapezius muscle spasm 09/28/2015   Epistaxis 11/14/2015   Essential hypertension 06/11/2016   Cellulitis and abscess of buttock 06/04/2017   Calculus of gallbladder 10/21/2017   Elevated lipase 10/21/2017   Diarrhea 10/21/2017   Upper abdominal pain 10/21/2017   Left-sided nosebleed 03/02/2018   TMJ pain dysfunction syndrome 03/02/2018   Colon polyps 03/31/2018   Neck fullness 08/31/2018   Rotator cuff syndrome, left 05/26/2019   Injection site reaction 04/04/2020   Osteopenia 05/10/2021   Personal history of breast cancer 10/15/2021   Diverticular disease of colon 10/15/2021   Hirsutism 10/19/2021   Night sweats 10/19/2021   Primary osteoarthritis of left knee 12/19/2021   Fall 03/08/2022   Snoring 05/15/2022   Non-restorative sleep 05/15/2022   No energy  05/15/2022   Post-menopausal 05/15/2022   Stress at home 10/15/2022   Tardive dyskinesia 05/12/2023   Facial twitching 05/12/2023   Medication side effect 05/12/2023   Decreased GFR 06/16/2023   Resolved Ambulatory Problems    Diagnosis Date Noted   OAB (overactive bladder) 03/08/2015   Vitamin D deficiency 03/09/2015   Impingement syndrome of left shoulder 08/28/2015   Acute upper respiratory infection 12/15/2015   Past Medical History:  Diagnosis Date   Allergy    Anxiety    Asthma    Depression    Dyspnea    Hypertension    Hypothyroidism    Postmenopausal    Thyroid disease      ROS See HPI.    Objective:     BP (!) 117/53   Pulse 78   Temp 98 F (36.7 C) (Oral)   Ht 5\' 3"  (1.6 m)   Wt 143 lb (64.9 kg)   LMP 06/24/2000   SpO2 99%   BMI 25.33 kg/m  BP Readings from Last 3 Encounters:  06/16/23 (!) 117/53  05/09/23 122/60  04/16/23 112/61   Wt Readings from Last 3 Encounters:  06/16/23 143 lb (64.9 kg)  05/09/23 148 lb 12 oz (67.5 kg)  04/16/23 147 lb (66.7 kg)    ..    06/16/2023   12:45 PM 04/21/2023    9:48 AM 02/14/2023   10:35 AM 12/03/2022   10:29 AM 11/12/2022   11:07 AM  Depression screen PHQ 2/9  Decreased Interest 0 0 0  0 0  Down, Depressed, Hopeless 0 0 0 0 0  PHQ - 2 Score 0 0 0 0 0  Altered sleeping 0    0  Tired, decreased energy 0    1  Change in appetite 0    0  Feeling bad or failure about yourself  0    0  Trouble concentrating 0    0  Moving slowly or fidgety/restless 0    0  Suicidal thoughts 0    0  PHQ-9 Score 0    1  Difficult doing work/chores Not difficult at all    Not difficult at all   ..    06/16/2023   12:45 PM 02/14/2023   10:35 AM 11/12/2022   11:08 AM 10/15/2022   10:11 AM  GAD 7 : Generalized Anxiety Score  Nervous, Anxious, on Edge 1 1 1 1   Control/stop worrying 1 1 0 1  Worry too much - different things 0 1 0 1  Trouble relaxing 0 0 0 1  Restless 0 1 0 1  Easily annoyed or irritable 0 0 0 1   Afraid - awful might happen 0 0 0 1  Total GAD 7 Score 2 4 1 7   Anxiety Difficulty Not difficult at all Not difficult at all Not difficult at all Somewhat difficult      Physical Exam Constitutional:      Appearance: Normal appearance.  HENT:     Head: Normocephalic.  Cardiovascular:     Rate and Rhythm: Normal rate and regular rhythm.  Pulmonary:     Effort: Pulmonary effort is normal.  Neurological:     General: No focal deficit present.     Mental Status: She is alert and oriented to person, place, and time.  Psychiatric:        Mood and Affect: Mood normal.       The 10-year ASCVD risk score (Arnett DK, et al., 2019) is: 13%    Assessment & Plan:  Marland KitchenMarland KitchenLuwam was seen today for medical management of chronic issues.  Diagnoses and all orders for this visit:  Anxiety and depression -     Cancel: TSH + free T4 -     Cancel: CMP14+EGFR -     TSH + free T4 -     CMP14+EGFR  Postablative hypothyroidism -     Cancel: TSH + free T4 -     Cancel: CMP14+EGFR -     TSH + free T4 -     CMP14+EGFR  Decreased GFR -     Cancel: TSH + free T4 -     Cancel: CMP14+EGFR -     TSH + free T4 -     CMP14+EGFR  Tardive dyskinesia    BP looks good PHQ/GAD no concerns Recheck CMP and TSH Resolved TD Follow up in 3 months  Return in about 3 months (around 09/14/2023).    Tandy Gaw, PA-C

## 2023-06-17 LAB — CMP14+EGFR
ALT: 9 [IU]/L (ref 0–32)
AST: 13 [IU]/L (ref 0–40)
Albumin: 4.4 g/dL (ref 3.8–4.8)
Alkaline Phosphatase: 124 [IU]/L — ABNORMAL HIGH (ref 44–121)
BUN/Creatinine Ratio: 14 (ref 12–28)
BUN: 14 mg/dL (ref 8–27)
Bilirubin Total: 0.4 mg/dL (ref 0.0–1.2)
CO2: 25 mmol/L (ref 20–29)
Calcium: 9.7 mg/dL (ref 8.7–10.3)
Chloride: 103 mmol/L (ref 96–106)
Creatinine, Ser: 0.99 mg/dL (ref 0.57–1.00)
Globulin, Total: 2 g/dL (ref 1.5–4.5)
Glucose: 86 mg/dL (ref 70–99)
Potassium: 4.4 mmol/L (ref 3.5–5.2)
Sodium: 142 mmol/L (ref 134–144)
Total Protein: 6.4 g/dL (ref 6.0–8.5)
eGFR: 61 mL/min/{1.73_m2} (ref >59)

## 2023-06-17 LAB — TSH+FREE T4
Free T4: 1.66 ng/dL (ref 0.82–1.77)
TSH: 0.403 u[IU]/mL — ABNORMAL LOW (ref 0.450–4.500)

## 2023-06-19 ENCOUNTER — Other Ambulatory Visit: Payer: Self-pay | Admitting: Physician Assistant

## 2023-06-19 DIAGNOSIS — E039 Hypothyroidism, unspecified: Secondary | ICD-10-CM

## 2023-06-20 ENCOUNTER — Encounter: Payer: Self-pay | Admitting: Physician Assistant

## 2023-06-20 MED ORDER — LEVOTHYROXINE SODIUM 50 MCG PO TABS: 50.0000 ug | ORAL_TABLET | Freq: Every day | ORAL | 0 refills | Status: DC

## 2023-06-20 NOTE — Progress Notes (Signed)
Gunnar Fusi,   Kidney function much better! Great news.  Free T4 looks good and in normal range. TSH a little suppressed. We could stay at this dose if feeling fine or decrease dose a little more. Thoughts?

## 2023-06-26 ENCOUNTER — Other Ambulatory Visit: Payer: Self-pay | Admitting: Physician Assistant

## 2023-06-26 DIAGNOSIS — I10 Essential (primary) hypertension: Secondary | ICD-10-CM

## 2023-07-09 ENCOUNTER — Other Ambulatory Visit: Payer: Self-pay | Admitting: Physician Assistant

## 2023-07-09 DIAGNOSIS — E890 Postprocedural hypothyroidism: Secondary | ICD-10-CM

## 2023-07-12 ENCOUNTER — Other Ambulatory Visit: Payer: Self-pay

## 2023-07-12 ENCOUNTER — Encounter (HOSPITAL_BASED_OUTPATIENT_CLINIC_OR_DEPARTMENT_OTHER): Payer: Self-pay

## 2023-07-12 ENCOUNTER — Emergency Department (HOSPITAL_BASED_OUTPATIENT_CLINIC_OR_DEPARTMENT_OTHER): Payer: Medicare Other

## 2023-07-12 ENCOUNTER — Emergency Department (HOSPITAL_BASED_OUTPATIENT_CLINIC_OR_DEPARTMENT_OTHER)
Admission: EM | Admit: 2023-07-12 | Discharge: 2023-07-12 | Disposition: A | Discharge status: Home / Self Care | Payer: Medicare Other | Attending: Emergency Medicine | Admitting: Emergency Medicine

## 2023-07-12 DIAGNOSIS — Z79899 Other long term (current) drug therapy: Secondary | ICD-10-CM | POA: Diagnosis not present

## 2023-07-12 DIAGNOSIS — J45909 Unspecified asthma, uncomplicated: Secondary | ICD-10-CM | POA: Diagnosis not present

## 2023-07-12 DIAGNOSIS — S3992XA Unspecified injury of lower back, initial encounter: Secondary | ICD-10-CM | POA: Diagnosis present

## 2023-07-12 DIAGNOSIS — S79912A Unspecified injury of left hip, initial encounter: Secondary | ICD-10-CM | POA: Diagnosis not present

## 2023-07-12 DIAGNOSIS — Z853 Personal history of malignant neoplasm of breast: Secondary | ICD-10-CM | POA: Insufficient documentation

## 2023-07-12 DIAGNOSIS — Z043 Encounter for examination and observation following other accident: Secondary | ICD-10-CM | POA: Diagnosis not present

## 2023-07-12 DIAGNOSIS — R55 Syncope and collapse: Secondary | ICD-10-CM | POA: Diagnosis not present

## 2023-07-12 DIAGNOSIS — Z7951 Long term (current) use of inhaled steroids: Secondary | ICD-10-CM | POA: Insufficient documentation

## 2023-07-12 DIAGNOSIS — K5730 Diverticulosis of large intestine without perforation or abscess without bleeding: Secondary | ICD-10-CM | POA: Diagnosis not present

## 2023-07-12 DIAGNOSIS — R42 Dizziness and giddiness: Secondary | ICD-10-CM

## 2023-07-12 DIAGNOSIS — M4187 Other forms of scoliosis, lumbosacral region: Secondary | ICD-10-CM | POA: Diagnosis not present

## 2023-07-12 DIAGNOSIS — M47817 Spondylosis without myelopathy or radiculopathy, lumbosacral region: Secondary | ICD-10-CM | POA: Diagnosis not present

## 2023-07-12 DIAGNOSIS — S0990XA Unspecified injury of head, initial encounter: Secondary | ICD-10-CM | POA: Insufficient documentation

## 2023-07-12 DIAGNOSIS — M47816 Spondylosis without myelopathy or radiculopathy, lumbar region: Secondary | ICD-10-CM | POA: Diagnosis not present

## 2023-07-12 DIAGNOSIS — M51369 Other intervertebral disc degeneration, lumbar region without mention of lumbar back pain or lower extremity pain: Secondary | ICD-10-CM | POA: Diagnosis not present

## 2023-07-12 DIAGNOSIS — W19XXXA Unspecified fall, initial encounter: Secondary | ICD-10-CM | POA: Insufficient documentation

## 2023-07-12 DIAGNOSIS — I10 Essential (primary) hypertension: Secondary | ICD-10-CM | POA: Diagnosis not present

## 2023-07-12 DIAGNOSIS — M4316 Spondylolisthesis, lumbar region: Secondary | ICD-10-CM | POA: Diagnosis not present

## 2023-07-12 DIAGNOSIS — S300XXA Contusion of lower back and pelvis, initial encounter: Secondary | ICD-10-CM | POA: Diagnosis not present

## 2023-07-12 DIAGNOSIS — I700 Atherosclerosis of aorta: Secondary | ICD-10-CM | POA: Diagnosis not present

## 2023-07-12 LAB — COMPREHENSIVE METABOLIC PANEL
ALT: 13 U/L (ref 0–44)
AST: 18 U/L (ref 15–41)
Albumin: 4.1 g/dL (ref 3.5–5.0)
Alkaline Phosphatase: 112 U/L (ref 38–126)
Anion gap: 9 (ref 5–15)
BUN: 19 mg/dL (ref 8–23)
CO2: 29 mmol/L (ref 22–32)
Calcium: 9.5 mg/dL (ref 8.9–10.3)
Chloride: 101 mmol/L (ref 98–111)
Creatinine, Ser: 1.13 mg/dL — ABNORMAL HIGH (ref 0.44–1.00)
GFR, Estimated: 52 mL/min — ABNORMAL LOW (ref >60)
Glucose, Bld: 137 mg/dL — ABNORMAL HIGH (ref 70–99)
Potassium: 3.7 mmol/L (ref 3.5–5.1)
Sodium: 139 mmol/L (ref 135–145)
Total Bilirubin: 0.6 mg/dL (ref 0.0–1.2)
Total Protein: 7.1 g/dL (ref 6.5–8.1)

## 2023-07-12 LAB — CBC WITH DIFFERENTIAL/PLATELET
Abs Immature Granulocytes: 0.02 10*3/uL (ref 0.00–0.07)
Basophils Absolute: 0.1 10*3/uL (ref 0.0–0.1)
Basophils Relative: 1 %
Eosinophils Absolute: 0.3 10*3/uL (ref 0.0–0.5)
Eosinophils Relative: 3 %
HCT: 38.1 % (ref 36.0–46.0)
Hemoglobin: 12.5 g/dL (ref 12.0–15.0)
Immature Granulocytes: 0 %
Lymphocytes Relative: 38 %
Lymphs Abs: 3.5 10*3/uL (ref 0.7–4.0)
MCH: 28.2 pg (ref 26.0–34.0)
MCHC: 32.8 g/dL (ref 30.0–36.0)
MCV: 86 fL (ref 80.0–100.0)
Monocytes Absolute: 0.5 10*3/uL (ref 0.1–1.0)
Monocytes Relative: 6 %
Neutro Abs: 4.9 10*3/uL (ref 1.7–7.7)
Neutrophils Relative %: 52 %
Platelets: 253 10*3/uL (ref 150–400)
RBC: 4.43 MIL/uL (ref 3.87–5.11)
RDW: 13.2 % (ref 11.5–15.5)
WBC: 9.3 10*3/uL (ref 4.0–10.5)
nRBC: 0 % (ref 0.0–0.2)

## 2023-07-12 MED ORDER — OXYCODONE HCL 5 MG PO TABS
5.0000 mg | ORAL_TABLET | Freq: Once | ORAL | Status: AC
  Administered 2023-07-12: 5 mg via ORAL
  Filled 2023-07-12: qty 1

## 2023-07-12 MED ORDER — IOHEXOL 300 MG/ML  SOLN
75.0000 mL | Freq: Once | INTRAMUSCULAR | Status: AC | PRN
  Administered 2023-07-12: 75 mL via INTRAVENOUS

## 2023-07-12 MED ORDER — TIZANIDINE HCL 4 MG PO TABS: 4.0000 mg | ORAL_TABLET | Freq: Three times a day (TID) | ORAL | 0 refills | Status: DC | PRN

## 2023-07-12 NOTE — ED Triage Notes (Signed)
Pt reports slip and fell in shower 2 days ago  left bruising noted left hip area . Reports pain left hip and hit head. DEnies LOC and no blood thinners. Pt able to ambulate to triage

## 2023-07-12 NOTE — ED Notes (Signed)
Patient transported to CT 

## 2023-07-12 NOTE — ED Provider Notes (Signed)
[facility] [facility] Provider Note   CSN: [id number] Arrival date & time: 07/12/23  0915     History  Chief Complaint  Patient presents with   Fall   Hip Pain    Angelica [name] is a 73 y.o. female, hx of asthma, hypertension, breast cancer (2023), who presents ot the ED 2/2 to dizzy spell that occurred 2 days prior while on commode. She notes she was on the commode and attempted to get up and felt dizzy and slumped over to the L side and fell onto the shower floor. She hit her L flank and head, but denies use of blood thinners or LOC. Reports since then it has been very painful to walk and that her L side has been bruised. Denies any chest pain, sob, or headache or weakness prior to or during dizzy episode. Denies any blood in stools or abdominal pain. No further dizzy episodes.      Home Medications Prior to Admission medications   Medication Sig Start Date End Date Taking? Authorizing Provider  tiZANidine (ZANAFLEX) 4 MG tablet Take 1 tablet (4 mg total) by mouth every 8 (eight) hours as needed for muscle spasms. 07/12/23  Yes Alper Guilmette L, PA  albuterol (VENTOLIN HFA) 108 (90 Base) MCG/ACT inhaler Inhale 2 puffs into the lungs every 6 (six) hours as needed for wheezing. 04/16/23   Breeback, [name] L, PA-C  atorvastatin (LIPITOR) 40 MG tablet Take 1 tablet (40 mg total) by mouth daily. 02/14/23   Breeback, [name] L, PA-C  budesonide-formoterol (SYMBICORT) 160-4.5 MCG/ACT inhaler Inhale 2 puffs into the lungs 2 (two) times daily. 02/14/23   Breeback, [name] L, PA-C  busPIRone (BUSPAR) 30 MG tablet Take 1 tablet (30 mg total) by mouth in the morning and at bedtime. 05/07/23   Breeback, [name] L, PA-C  clonazePAM (KLONOPIN) 0.5 MG tablet TAKE 1 TABLET BY MOUTH AS NEEDED FOR ANXIETY . DO NOT EXCEED 1 PER 24 HOURS 06/16/23   Breeback, [name] L, PA-C  dicyclomine (BENTYL) 10 MG capsule Take 1 capsule (10 mg total) by mouth 2 (two) times daily. 02/14/23   Jomarie Longs, PA-C  escitalopram (LEXAPRO) 20 MG tablet Take 1 tablet by mouth once daily 04/22/23   Breeback, [name] L, PA-C  fluticasone (FLONASE) 50 MCG/ACT nasal spray Use 2 spray(s) in each nostril once daily 11/12/22   Breeback, [name] L, PA-C  hydrochlorothiazide (HYDRODIURIL) 12.5 MG tablet Take 1 tablet (12.5 mg total) by mouth daily. 02/14/23   Breeback, [name] L, PA-C  ibuprofen (ADVIL) 800 MG tablet Take 1 tablet (800 mg total) by mouth every 8 (eight) hours as needed. 01/25/23   Eustace Moore, MD  ipratropium-albuterol (DUONEB) 0.5-2.5 (3) MG/3ML SOLN Take 3 mLs by nebulization every 4 (four) hours as needed (wheeze, SOB). 12/03/22   Breeback, Lonna Cobb, PA-C  lamoTRIgine (LAMICTAL) 100 MG tablet Take 1 tablet (100 mg total) by mouth 2 (two) times daily. 02/14/23   Jomarie Longs, PA-C  levothyroxine (SYNTHROID) 50 MCG tablet Take 1 tablet (50 mcg total) by mouth daily. 06/20/23   Jomarie Longs, PA-C  levothyroxine (SYNTHROID) 75 MCG tablet TAKE 1 TABLET(75 MCG) BY MOUTH DAILY 06/12/23   Breeback, [name] L, PA-C  lisinopril (ZESTRIL) 40 MG tablet Take 1 tablet by mouth once daily 06/26/23   Breeback, [name] L, PA-C      Allergies    Penicillins, Red dye #40 (allura red), Sulfa antibiotics, Amitriptyline, Macrodantin, and Nitrofurantoin  Review of Systems   Review of Systems  Gastrointestinal:  Negative for abdominal pain.  Genitourinary:  Positive for flank pain.    Physical Exam Updated Vital Signs BP (!) 118/35   Pulse (!) 56   Temp 98.6 F (37 C) (Oral)   Resp 15   Wt 64.4 kg   LMP 06/24/2000   SpO2 100%   BMI 25.15 kg/m  Physical Exam Vitals and nursing note reviewed.  Constitutional:      General: She is not in acute distress.    Appearance: She is well-developed.  HENT:     Head: Normocephalic and atraumatic.  Eyes:     Conjunctiva/sclera: Conjunctivae normal.  Cardiovascular:     Rate and Rhythm: Normal rate and regular rhythm.     Heart sounds: No murmur  heard. Pulmonary:     Effort: Pulmonary effort is normal. No respiratory distress.     Breath sounds: Normal breath sounds.  Abdominal:     Palpations: Abdomen is soft.     Tenderness: There is no abdominal tenderness.  Musculoskeletal:        General: No swelling.     Cervical back: Neck supple.     Comments: TTP of L flank and L paraspinal muscles as well as midline lumbar spine. No chest wall ttp or ecchymoses or upper back pain. No cervical spine ttp.   Skin:    General: Skin is warm and dry.     Capillary Refill: Capillary refill takes less than 2 seconds.     Comments: +large ecchymotic area to L flank/back w/ttp  Neurological:     Mental Status: She is alert.  Psychiatric:        Mood and Affect: Mood normal.     ED Results / Procedures / Treatments   Labs (all labs ordered are listed, but only abnormal results are displayed) Labs Reviewed  COMPREHENSIVE METABOLIC PANEL - Abnormal; Notable for the following components:      Result Value   Glucose, Bld 137 (*)    Creatinine, Ser 1.13 (*)    GFR, Estimated 52 (*)    All other components within normal limits  CBC WITH DIFFERENTIAL/PLATELET  URINALYSIS, ROUTINE W REFLEX MICROSCOPIC    EKG EKG Interpretation Date/Time:  Saturday July 12 2023 14:49:13 EST Ventricular Rate:  62 PR Interval:  151 QRS Duration:  93 QT Interval:  429 QTC Calculation: 436 R Axis:   40  Text Interpretation: Sinus rhythm Low voltage, precordial leads Borderline T abnormalities, anterior leads No significant change since last tracing Confirmed by Melene Plan [phone]) on 07/12/2023 4:52:47 PM  Radiology CT L-SPINE NO CHARGE Result Date: 07/12/2023 CLINICAL DATA:  Larey Seat with trauma to the left flank and hip EXAM: CT LUMBAR SPINE WITHOUT CONTRAST TECHNIQUE: Multidetector CT imaging of the lumbar spine was performed without intravenous contrast administration. Multiplanar CT image reconstructions were also generated. RADIATION DOSE REDUCTION:  This exam was performed according to the departmental dose-optimization program which includes automated exposure control, adjustment of the mA and/or kV according to patient size and/or use of iterative reconstruction technique. COMPARISON:  Radiography same day FINDINGS: Segmentation: 5 lumbar type vertebral bodies. Alignment: Mild scoliotic curvature convex to the left. No listhesis. Vertebrae: No regional fracture or focal bone lesion. Paraspinal and other soft tissues: No traumatic retroperitoneal finding. Mild bruising of the fat of the left flank. Disc levels: Chronic degenerative disc disease at L4-5 with disc space narrowing and endplate osteophytes. No compressive canal or foraminal narrowing. Facet  osteoarthritis on the right at L5-S1. IMPRESSION: No acute or traumatic finding. Mild scoliotic curvature convex to the left. Chronic degenerative disc disease at L4-5. Facet osteoarthritis on the right at L5-S1. Electronically Signed   By: Paulina Fusi M.D.   On: 07/12/2023 17:08   CT ABDOMEN PELVIS W CONTRAST Result Date: 07/12/2023 CLINICAL DATA:  Slipped and fell in the shower 2 days ago with bruising of the left back and hip. EXAM: CT ABDOMEN AND PELVIS WITH CONTRAST TECHNIQUE: Multidetector CT imaging of the abdomen and pelvis was performed using the standard protocol following bolus administration of intravenous contrast. RADIATION DOSE REDUCTION: This exam was performed according to the departmental dose-optimization program which includes automated exposure control, adjustment of the mA and/or kV according to patient size and/or use of iterative reconstruction technique. CONTRAST:  75mL OMNIPAQUE IOHEXOL 300 MG/ML  SOLN COMPARISON:  10/13/2017 FINDINGS: Lower chest: Lung bases are clear. Previous breast reconstruction on the right. Hepatobiliary: Liver parenchyma is normal. Previous cholecystectomy. Pancreas: Normal Spleen: Normal Adrenals/Urinary Tract: Adrenal glands are normal. Kidneys are  normal. Bladder is normal. Stomach/Bowel: No acute or significant bowel finding. No sign of obstruction, mass or inflammation. Sigmoid diverticulosis without evidence of diverticulitis. Vascular/Lymphatic: Aortic atherosclerosis. No aneurysm. IVC is normal. No adenopathy. Reproductive: Normal Other: Tiny amount of free fluid in the pelvic cul-de-sac, not significant. Soft tissue bruising of the left posterolateral subcutaneous fat. No evidence of frank hematoma or deep bleeding. Musculoskeletal: No regional fracture. IMPRESSION: 1. Soft tissue bruising of the left posterolateral subcutaneous fat. No evidence of frank hematoma or deep bleeding. No regional fracture. 2. No acute intra-abdominal or intrapelvic finding. 3. Aortic atherosclerosis. 4. Sigmoid diverticulosis without evidence of diverticulitis. 5. Previous cholecystectomy. 6. Previous breast reconstruction on the right. Aortic Atherosclerosis (ICD10-I70.0). Electronically Signed   By: Paulina Fusi M.D.   On: 07/12/2023 17:06   CT Head Wo Contrast Result Date: 07/12/2023 CLINICAL DATA:  Head trauma. EXAM: CT HEAD WITHOUT CONTRAST TECHNIQUE: Contiguous axial images were obtained from the base of the skull through the vertex without intravenous contrast. RADIATION DOSE REDUCTION: This exam was performed according to the departmental dose-optimization program which includes automated exposure control, adjustment of the mA and/or kV according to patient size and/or use of iterative reconstruction technique. COMPARISON:  12/08/2021 FINDINGS: Brain: No evidence of acute infarction, hemorrhage, hydrocephalus, extra-axial collection or mass lesion/mass effect. Vascular: No hyperdense vessel or unexpected calcification. Skull: Normal. Negative for fracture or focal lesion. Sinuses/Orbits: No acute finding. Other: None. IMPRESSION: No acute intracranial abnormalities. Electronically Signed   By: Signa Kell M.D.   On: 07/12/2023 17:05   DG Lumbar Spine  Complete Result Date: 07/12/2023 CLINICAL DATA:  Slipped and fell in the shower 2 days ago. Bruising to the left hip. EXAM: LUMBAR SPINE - COMPLETE 4+ VIEW COMPARISON:  CT abdomen and pelvis, 10/13/2017. FINDINGS: No fracture or bone lesion. Minimal anterolisthesis of L3 on L4. No other spondylolisthesis. Mild curvature, convex to the left the upper lumbar spine. Moderate to marked loss of disc height at L4-L5. Remaining disc spaces are relatively well preserved. There are facet degenerative changes at L3-L4 through L5-S1. Scattered aortic atherosclerotic calcifications. IMPRESSION: 1. No fracture or acute finding. 2. Degenerative changes as detailed. Electronically Signed   By: Amie Portland M.D.   On: 07/12/2023 10:46   DG Hip Unilat W or Wo Pelvis 2-3 Views Left Result Date: 07/12/2023 CLINICAL DATA:  73 year old female with history of trauma from a fall. EXAM: DG HIP (WITH OR  WITHOUT PELVIS) 2-3V LEFT COMPARISON:  No priors. FINDINGS: There is no evidence of hip fracture or dislocation. There is no evidence of arthropathy or other focal bone abnormality. IMPRESSION: Negative. Electronically Signed   By: Trudie Reed M.D.   On: 07/12/2023 10:45    Procedures Procedures    Medications Ordered in ED Medications  oxyCODONE (Oxy IR/ROXICODONE) immediate release tablet 5 mg (5 mg Oral Given 07/12/23 1434)  iohexol (OMNIPAQUE) 300 MG/ML solution 75 mL (75 mLs Intravenous Contrast Given 07/12/23 1641)    ED Course/ Medical Decision Making/ A&P                                 Medical Decision Making Patient is a 73 year old female, who got dizzy yesterday after urinating, and fell and hit her head, on the shower.  She did not pass out no use of blood thinners.  She is 65+ thus we will obtain a head CT.  I did an evaluation of her, she denies any chest pain, shortness of breath, with the episode, or currently.  I do not see any ecchymosis, on her chest, but she does have a large amount of  ecchymosis, the posterior left back./Flank.  We will obtain a CT abdomen pelvis, to rule out any kind of intra-abdominal bleed secondary to this ecchymosis present.  We also obtain basic labs, and EKG secondary to the dizziness.  I am suspicious for possible this possibly post micturition syncope versus orthostatics  Amount and/or Complexity of Data Reviewed Labs: ordered.    Details: Blood work is unremarkable Radiology: ordered.    Details: CT abdomen pelvis shows some soft tissue ecchymosis, but no internal findings.  L-spine within normal limits for any acute findings ECG/medicine tests:  Decision-making details documented in ED Course. Discussion of management or test interpretation with external provider(s): Patient is overall well-appearing feeling little bit better after oxycodone.  Requesting muscle relaxer to be sent to the pharmacy.  We went over her findings, and she only has some soft tissue bruising which is reassuring.  No intra-abdominal bleeding.  Head CT is unremarkable, L-spine shows no acute findings.  This is just a soft tissue contusion, no evidence of any kind of fractures.  Patient is able to ambulate.  We discussed conservative care, I sent a muscle relaxer to the pharmacy, as requested.  Risk Prescription drug management.    Final Clinical Impression(s) / ED Diagnoses Final diagnoses:  Fall, initial encounter  Injury of head, initial encounter  Dizziness  Traumatic ecchymosis of lower back, initial encounter    Rx / DC Orders ED Discharge Orders          Ordered    tiZANidine (ZANAFLEX) 4 MG tablet  Every 8 hours PRN        07/12/23 1720              Ginnette Gates, Harley Alto, PA 07/12/23 1744    Melene Plan, DO 07/12/23 1854

## 2023-07-12 NOTE — Discharge Instructions (Addendum)
Please follow-up with your primary care doctor, return if you feel like your symptoms are worsening.  Be very gentle as you have some bruising, of your back, abdomen.  There is no evidence of any kind of fractures however.  I prescribed a few days worth of muscle relaxers, to help with your severe pain.  You can also take Epson salt baths, or use ice to help with the pain.

## 2023-08-25 DIAGNOSIS — H2513 Age-related nuclear cataract, bilateral: Secondary | ICD-10-CM | POA: Diagnosis not present

## 2023-08-25 DIAGNOSIS — H524 Presbyopia: Secondary | ICD-10-CM | POA: Diagnosis not present

## 2023-09-04 ENCOUNTER — Other Ambulatory Visit: Payer: Self-pay | Admitting: Physician Assistant

## 2023-09-05 ENCOUNTER — Other Ambulatory Visit: Payer: Self-pay | Admitting: Physician Assistant

## 2023-09-05 DIAGNOSIS — I10 Essential (primary) hypertension: Secondary | ICD-10-CM

## 2023-09-05 NOTE — Telephone Encounter (Signed)
 Copied from CRM [id number]. Topic: Clinical - Medication Refill >> Sep 05, 2023 10:04 AM Macon Large wrote: Most Recent Primary Care Visit:  Provider: Jomarie Longs  Department: [facility]  Visit Type: OFFICE VISIT  Date: 06/16/2023  Medication: hydrochlorothiazide (HYDRODIURIL) 12.5 MG tablet  Has the patient contacted their pharmacy? Yes  Is this the correct pharmacy for this prescription? Yes If no, delete pharmacy and type the correct one.  This is the patient's preferred pharmacy:  [id number] - [place], [place] - [address] AT [facility] [address] [place] [address] Phone: [phone] Fax: [phone]   Has the prescription been filled recently? No  Is the patient out of the medication? Yes  Has the patient been seen for an appointment in the last year OR does the patient have an upcoming appointment? Yes  Can we respond through MyChart? Yes  Agent: Please be advised that Rx refills may take up to 3 business days. We ask that you follow-up with your pharmacy.

## 2023-09-05 NOTE — Telephone Encounter (Signed)
 Requesting rx rf of hydrochlorothiazide 12.5mg  RED DYE ALLERGY ALERT Last written 02/14/2023 Last OV12/23/2024 Upcoming appt 09/15/2023

## 2023-09-07 MED ORDER — HYDROCHLOROTHIAZIDE 12.5 MG PO TABS: 12.5000 mg | ORAL_TABLET | Freq: Every day | ORAL | 1 refills | Status: DC

## 2023-09-15 ENCOUNTER — Encounter: Payer: Self-pay | Admitting: Physician Assistant

## 2023-09-15 ENCOUNTER — Ambulatory Visit (INDEPENDENT_AMBULATORY_CARE_PROVIDER_SITE_OTHER): Payer: Medicare Other | Admitting: Physician Assistant

## 2023-09-15 VITALS — BP 135/80 | HR 74 | Ht 63.0 in | Wt 143.0 lb

## 2023-09-15 DIAGNOSIS — E785 Hyperlipidemia, unspecified: Secondary | ICD-10-CM | POA: Diagnosis not present

## 2023-09-15 DIAGNOSIS — E890 Postprocedural hypothyroidism: Secondary | ICD-10-CM | POA: Diagnosis not present

## 2023-09-15 DIAGNOSIS — F32A Depression, unspecified: Secondary | ICD-10-CM

## 2023-09-15 DIAGNOSIS — I10 Essential (primary) hypertension: Secondary | ICD-10-CM

## 2023-09-15 DIAGNOSIS — G479 Sleep disorder, unspecified: Secondary | ICD-10-CM | POA: Diagnosis not present

## 2023-09-15 DIAGNOSIS — F419 Anxiety disorder, unspecified: Secondary | ICD-10-CM

## 2023-09-15 DIAGNOSIS — F439 Reaction to severe stress, unspecified: Secondary | ICD-10-CM

## 2023-09-15 DIAGNOSIS — Z13220 Encounter for screening for lipoid disorders: Secondary | ICD-10-CM

## 2023-09-15 DIAGNOSIS — F39 Unspecified mood [affective] disorder: Secondary | ICD-10-CM | POA: Diagnosis not present

## 2023-09-15 DIAGNOSIS — F411 Generalized anxiety disorder: Secondary | ICD-10-CM

## 2023-09-15 MED ORDER — BUSPIRONE HCL 30 MG PO TABS: 30.0000 mg | ORAL_TABLET | Freq: Two times a day (BID) | ORAL | 1 refills | Status: DC

## 2023-09-15 MED ORDER — LAMOTRIGINE 100 MG PO TABS: 100.0000 mg | ORAL_TABLET | Freq: Two times a day (BID) | ORAL | 1 refills | Status: DC

## 2023-09-15 MED ORDER — ESCITALOPRAM OXALATE 20 MG PO TABS: ORAL_TABLET | ORAL | 1 refills | Status: DC

## 2023-09-15 MED ORDER — TRAZODONE HCL 50 MG PO TABS: 25.0000 mg | ORAL_TABLET | Freq: Every evening | ORAL | 1 refills | Status: DC | PRN

## 2023-09-15 MED ORDER — LISINOPRIL 40 MG PO TABS: 40.0000 mg | ORAL_TABLET | Freq: Every day | ORAL | 1 refills | Status: DC

## 2023-09-15 NOTE — Progress Notes (Unsigned)
 Established Patient Office Visit  Subjective   Patient ID: Angelica [name], female    DOB: December 26, 1950  Age: 73 y.o. MRN: [id number]  Chief Complaint  Patient presents with   Medical Management of Chronic Issues    3 month fup on mood    HPI Pt is a 73 yo female with HTN, Mood disorder, HLD, hypothyroidism who presents to the clinic for medication refills.   Pt is not checking BP at home. She is complaint with medications. Pt denies any CP, palpitations, headaches or vision changes.   She does admit to being more anxious and stressed. She is not sleeping well at all. She struggles to go sleep worrying about things.   .. Active Ambulatory Problems    Diagnosis Date Noted   Breast cancer (HCC) 01/31/2011   GAD (generalized anxiety disorder) 01/31/2011   Asthma 01/31/2011   Seasonal allergies 01/31/2011   Hypothyroid 01/31/2011   Episodic mood disorder (HCC) 10/17/2012   Anxiety and depression 08/03/2013   Chondromalacia of right patellofemoral joint 02/10/2014   Insomnia 03/15/2014   IBS (irritable bowel syndrome) 08/22/2014   Dyslipidemia (high LDL; low HDL) 03/09/2015   Elevated blood pressure 04/26/2015   Trapezius muscle spasm 09/28/2015   Epistaxis 11/14/2015   Essential hypertension 06/11/2016   Cellulitis and abscess of buttock 06/04/2017   Calculus of gallbladder 10/21/2017   Elevated lipase 10/21/2017   Diarrhea 10/21/2017   Upper abdominal pain 10/21/2017   Left-sided nosebleed 03/02/2018   TMJ pain dysfunction syndrome 03/02/2018   Colon polyps 03/31/2018   Neck fullness 08/31/2018   Rotator cuff syndrome, left 05/26/2019   Injection site reaction 04/04/2020   Osteopenia 05/10/2021   Personal history of breast cancer 10/15/2021   Diverticular disease of colon 10/15/2021   Hirsutism 10/19/2021   Night sweats 10/19/2021   Primary osteoarthritis of left knee 12/19/2021   Fall 03/08/2022   Snoring 05/15/2022   Non-restorative sleep 05/15/2022   No energy  05/15/2022   Post-menopausal 05/15/2022   Stress at home 10/15/2022   Tardive dyskinesia 05/12/2023   Facial twitching 05/12/2023   Medication side effect 05/12/2023   Decreased GFR 06/16/2023   CKD (chronic kidney disease) stage 3, GFR 30-59 ml/min (HCC) 09/16/2023   Resolved Ambulatory Problems    Diagnosis Date Noted   OAB (overactive bladder) 03/08/2015   Vitamin D deficiency 03/09/2015   Impingement syndrome of left shoulder 08/28/2015   Acute upper respiratory infection 12/15/2015   Past Medical History:  Diagnosis Date   Allergy    Anxiety    Asthma    Depression    Dyspnea    Hypertension    Hypothyroidism    Postmenopausal    Thyroid disease     Review of Systems  All other systems reviewed and are negative.     Objective:     BP 135/80   Pulse 74   Ht 5\' 3"  (1.6 m)   Wt 143 lb (64.9 kg)   LMP 06/24/2000   SpO2 99%   BMI 25.33 kg/m  BP Readings from Last 3 Encounters:  09/15/23 135/80  07/12/23 (!) 118/35  06/16/23 (!) 117/53   Wt Readings from Last 3 Encounters:  09/15/23 143 lb (64.9 kg)  07/12/23 142 lb (64.4 kg)  06/16/23 143 lb (64.9 kg)   ..    09/15/2023   10:59 AM 06/16/2023   12:45 PM 04/21/2023    9:48 AM 02/14/2023   10:35 AM 12/03/2022   10:29 AM  Depression  screen PHQ 2/9  Decreased Interest 0 0 0 0 0  Down, Depressed, Hopeless 0 0 0 0 0  PHQ - 2 Score 0 0 0 0 0  Altered sleeping 2 0     Tired, decreased energy 2 0     Change in appetite 0 0     Feeling bad or failure about yourself  0 0     Trouble concentrating 2 0     Moving slowly or fidgety/restless 2 0     Suicidal thoughts 0 0     PHQ-9 Score 8 0     Difficult doing work/chores Not difficult at all Not difficult at all      ..    09/15/2023   10:59 AM 06/16/2023   12:45 PM 02/14/2023   10:35 AM 11/12/2022   11:08 AM  GAD 7 : Generalized Anxiety Score  Nervous, Anxious, on Edge 2 1 1 1   Control/stop worrying 2 1 1  0  Worry too much - different things 2 0 1 0   Trouble relaxing 2 0 0 0  Restless 2 0 1 0  Easily annoyed or irritable 0 0 0 0  Afraid - awful might happen 2 0 0 0  Total GAD 7 Score 12 2 4 1   Anxiety Difficulty Somewhat difficult Not difficult at all Not difficult at all Not difficult at all        Physical Exam Constitutional:      Appearance: Normal appearance.  HENT:     Head: Normocephalic.  Cardiovascular:     Rate and Rhythm: Normal rate and regular rhythm.  Pulmonary:     Effort: Pulmonary effort is normal.     Breath sounds: Normal breath sounds.  Musculoskeletal:     Right lower leg: No edema.     Left lower leg: No edema.  Neurological:     General: No focal deficit present.     Mental Status: She is alert and oriented to person, place, and time.  Psychiatric:        Mood and Affect: Mood normal.       The 10-year ASCVD risk score (Arnett DK, et al., 2019) is: 17.4%    Assessment & Plan:  Marland KitchenMarland KitchenHatice was seen today for medical management of chronic issues.  Diagnoses and all orders for this visit:  Essential hypertension -     BMP8+eGFR -     lisinopril (ZESTRIL) 40 MG tablet; Take 1 tablet (40 mg total) by mouth daily.  Anxiety and depression -     busPIRone (BUSPAR) 30 MG tablet; Take 1 tablet (30 mg total) by mouth in the morning and at bedtime. -     lamoTRIgine (LAMICTAL) 100 MG tablet; Take 1 tablet (100 mg total) by mouth 2 (two) times daily. -     escitalopram (LEXAPRO) 20 MG tablet; Take 1 tablet by mouth once daily  Stress at home -     busPIRone (BUSPAR) 30 MG tablet; Take 1 tablet (30 mg total) by mouth in the morning and at bedtime.  Episodic mood disorder (HCC) -     lamoTRIgine (LAMICTAL) 100 MG tablet; Take 1 tablet (100 mg total) by mouth 2 (two) times daily.  Postablative hypothyroidism -     TSH + free T4  GAD (generalized anxiety disorder) -     BMP8+eGFR  Screening for lipid disorders -     Lipid panel  Dyslipidemia (high LDL; low HDL) -     Lipid panel  Trouble in  sleeping -     traZODone (DESYREL) 50 MG tablet; Take 0.5-1 tablets (25-50 mg total) by mouth at bedtime as needed for sleep.   BP looks good Continue on same dose Fasting labs ordered  PHQ/GAD not to goal Pt feels like sleep is the biggest issue Trazodone trial for sleep Follow up in 6-8 weeks Continue on same mood medications  Will check TSH and adjust accordingly   Tandy Gaw, PA-C

## 2023-09-16 ENCOUNTER — Encounter: Payer: Self-pay | Admitting: Physician Assistant

## 2023-09-16 DIAGNOSIS — N183 Chronic kidney disease, stage 3 unspecified: Secondary | ICD-10-CM | POA: Insufficient documentation

## 2023-09-16 LAB — LIPID PANEL
Chol/HDL Ratio: 4.5 ratio — ABNORMAL HIGH (ref 0.0–4.4)
Cholesterol, Total: 188 mg/dL (ref 100–199)
HDL: 42 mg/dL (ref >39)
LDL Chol Calc (NIH): 117 mg/dL — ABNORMAL HIGH (ref 0–99)
Triglycerides: 161 mg/dL — ABNORMAL HIGH (ref 0–149)
VLDL Cholesterol Cal: 29 mg/dL (ref 5–40)

## 2023-09-16 LAB — BMP8+EGFR
BUN/Creatinine Ratio: 11 — ABNORMAL LOW (ref 12–28)
BUN: 13 mg/dL (ref 8–27)
CO2: 26 mmol/L (ref 20–29)
Calcium: 9.8 mg/dL (ref 8.7–10.3)
Chloride: 100 mmol/L (ref 96–106)
Creatinine, Ser: 1.17 mg/dL — ABNORMAL HIGH (ref 0.57–1.00)
Glucose: 85 mg/dL (ref 70–99)
Potassium: 4.2 mmol/L (ref 3.5–5.2)
Sodium: 140 mmol/L (ref 134–144)
eGFR: 50 mL/min/{1.73_m2} — ABNORMAL LOW (ref >59)

## 2023-09-16 LAB — TSH+FREE T4
Free T4: 1.26 ng/dL (ref 0.82–1.77)
TSH: 3.17 u[IU]/mL (ref 0.450–4.500)

## 2023-09-16 MED ORDER — DAPAGLIFLOZIN PROPANEDIOL 10 MG PO TABS: 10.0000 mg | ORAL_TABLET | Freq: Every day | ORAL | 0 refills | Status: DC

## 2023-09-16 MED ORDER — ATORVASTATIN CALCIUM 80 MG PO TABS: 80.0000 mg | ORAL_TABLET | Freq: Every day | ORAL | 3 refills | Status: AC

## 2023-09-16 NOTE — Progress Notes (Signed)
 Angelica [name],   Thyroid looks better!   Your GFR is 50 and in the chronic kidney disease stage 3a. I think we should start a medication that can help protect and prevent any further kidney function decline. It is called farxiga. Would you like to discuss?   LDL is still not to optimal goal. Are you ok with increasing lipitor to 80mg ? Goal is LDL under 100 for you.   [name].The 10-year ASCVD risk score (Arnett DK, et al., 2019) is: 17.4%   Values used to calculate the score:     Age: 73 years     Sex: Female     Is Non-Hispanic African American: No     Diabetic: No     Tobacco smoker: No     Systolic Blood Pressure: 135 mmHg     Is BP treated: Yes     HDL Cholesterol: 42 mg/dL     Total Cholesterol: 188 mg/dL

## 2023-09-17 ENCOUNTER — Encounter: Payer: Self-pay | Admitting: Physician Assistant

## 2023-09-17 DIAGNOSIS — N1831 Chronic kidney disease, stage 3a: Secondary | ICD-10-CM

## 2023-09-17 DIAGNOSIS — I10 Essential (primary) hypertension: Secondary | ICD-10-CM

## 2023-09-19 ENCOUNTER — Telehealth: Payer: Self-pay | Admitting: *Deleted

## 2023-09-19 NOTE — Progress Notes (Signed)
 Care Guide Pharmacy Note  09/19/2023 Name: Angelica [name] MRN: [id number] DOB: 03-Jul-1950  Referred By: Jomarie Longs, PA-C Reason for referral: Care Coordination (Outreach to schedule referral with pharmacist )   Angelica [name] is a 73 y.o. year old female who is a primary care patient of Jomarie Longs, PA-C.  Angelica [name] was referred to the pharmacist for assistance related to: CKD Stage 3  Successful contact was made with the patient to discuss pharmacy services including being ready for the pharmacist to call at least 5 minutes before the scheduled appointment time and to have medication bottles and any blood pressure readings ready for review. The patient agreed to meet with the pharmacist via telephone visit on 09/26/2023  Burman Nieves, CMA [place]  [facility], [facility] Guide Direct Dial: [phone]  Fax: [phone] Website: [email or web address]

## 2023-09-26 ENCOUNTER — Other Ambulatory Visit: Payer: Self-pay

## 2023-09-26 NOTE — Progress Notes (Signed)
   09/26/2023  Patient ID: Angelica [name], female   DOB: May 26, 1951, 73 y.o.   MRN: [id number]  Subjective/Objective Telephone visit to assist with affordability of Farxiga 10mg  in response to referral placed by patient's PCP, Angelica Gaw, PA  Medication Access/Affordability -Prescribed Farxiga 10mg  daily for renal protection in patient with stage III CKD -Most recent eGFR 50 on 09/15/2023 -Patient has not yet been able to start medication due to copay on Medicare D  Assessment/Plan  Medication Access/Affordability -Based on [facility], patient would qualify for AZ&Me PAP for Farxiga 10mg  daily.  Coordinating with medication access team and PCP to initiate application process.  Follow-up:  Patient provided my direct number in case she does not receive application in the next few business days, or if he needs assistance completing.    [name], PharmD, DPLA

## 2023-09-29 ENCOUNTER — Telehealth: Payer: Self-pay

## 2023-09-29 NOTE — Telephone Encounter (Signed)
 PAP: Patient assistance application for Farxiga through [employer] ([employer]) has been mailed to pt's home address on file. Provider portion of application will be faxed to provider's office.

## 2023-10-01 NOTE — Telephone Encounter (Signed)
 Received Provider Portion of PAP [employer]- will follow up with Patient soon.

## 2023-10-07 NOTE — Telephone Encounter (Signed)
 PAP: Application for Farxiga has been submitted to [employer] ([employer]), via fax  PLEASE BE ADVISED  RECEIVED PATIENT PAGES  AND SCANNED INTO MEDIA OF CHART

## 2023-10-09 NOTE — Telephone Encounter (Signed)
 PAP: Patient assistance application for Farxiga has been approved by PAP Companies: [employer] from 10-08-2023 to 06/23/2024. Medication should be delivered to PAP Delivery: Home. For further shipping updates, please contact [employer] ([employer]) at [phone]. Patient ID is: [id number]

## 2023-10-13 ENCOUNTER — Other Ambulatory Visit: Payer: Self-pay | Admitting: Physician Assistant

## 2023-10-13 ENCOUNTER — Telehealth: Payer: Self-pay

## 2023-10-13 DIAGNOSIS — G479 Sleep disorder, unspecified: Secondary | ICD-10-CM

## 2023-10-13 NOTE — Progress Notes (Signed)
   10/13/2023  Patient ID: Angelica [name], female   DOB: Feb 27, 1951, 73 y.o.   MRN: [id number]  Returning missed call/voicemail from patient regarding Farxiga  10mg  daily prescribed for Stage III CKD.  Patient has been approved to receive this through [employer] PAP, and medication should arrive in the next 7-10 business days.  Informed patient she can reach out to me if medication is not received in that time frame.  Telephone follow-up visit scheduled in 4 weeks to see how patient is tolerating medication.  Linn Rich, PharmD, DPLA

## 2023-10-24 ENCOUNTER — Telehealth: Payer: Self-pay

## 2023-10-24 NOTE — Progress Notes (Signed)
   10/24/2023  Patient ID: Angelica [name], female   DOB: [birth year], 73 y.o.   MRN: [id number]  Missed call/voicemail from patient stating she has not yet received Farxiga  10mg , which was expected to have arrived by the end of this week.  Contacted [employer], and representative provided tracking # of [id number].  Tracking number reflects delivery will arrive tomorrow by 3pm.  Patient is on automatic refills, so she will no need to call to request fills each time.  Contacted patient to make her aware of this information.  Linn Rich, PharmD, DPLA

## 2023-11-07 ENCOUNTER — Other Ambulatory Visit: Payer: Self-pay

## 2023-11-07 NOTE — Progress Notes (Signed)
   11/07/2023  Patient ID: Angelica [name], female   DOB: Jan 09, 1951, 73 y.o.   MRN: [id number]  Subjective/Objective Telephone visit to follow-up on access/affordability of Farxiga  10mg    Medication Access/Affordability -Prescribed Farxiga  10mg  daily for renal protection in patient with stage III CKD -Most recent eGFR 50 on 09/15/2023 -Patient has received Farxiga  10mg  through [employer] PAP and has been taking approximately 1 week now.  She does endorse some increased urination, but is tolerating well otherwise.   Assessment/Plan   Medication Access/Affordability -Informed patient medication is on automatic refill -She will contact me if any medication needs arise   Linn Rich, PharmD, DPLA

## 2023-11-17 ENCOUNTER — Other Ambulatory Visit: Payer: Self-pay | Admitting: Physician Assistant

## 2023-11-17 DIAGNOSIS — G479 Sleep disorder, unspecified: Secondary | ICD-10-CM

## 2023-11-26 ENCOUNTER — Emergency Department (HOSPITAL_BASED_OUTPATIENT_CLINIC_OR_DEPARTMENT_OTHER)

## 2023-11-26 ENCOUNTER — Encounter (HOSPITAL_COMMUNITY): Payer: Self-pay

## 2023-11-26 ENCOUNTER — Inpatient Hospital Stay (HOSPITAL_BASED_OUTPATIENT_CLINIC_OR_DEPARTMENT_OTHER)
Admission: EM | Admit: 2023-11-26 | Discharge: 2023-11-29 | DRG: 312 | Disposition: A | Discharge status: Home / Self Care | Attending: Internal Medicine | Admitting: Internal Medicine

## 2023-11-26 ENCOUNTER — Other Ambulatory Visit: Payer: Self-pay

## 2023-11-26 DIAGNOSIS — I10 Essential (primary) hypertension: Secondary | ICD-10-CM | POA: Diagnosis not present

## 2023-11-26 DIAGNOSIS — I672 Cerebral atherosclerosis: Secondary | ICD-10-CM | POA: Diagnosis not present

## 2023-11-26 DIAGNOSIS — E860 Dehydration: Secondary | ICD-10-CM | POA: Diagnosis present

## 2023-11-26 DIAGNOSIS — F39 Unspecified mood [affective] disorder: Secondary | ICD-10-CM | POA: Diagnosis present

## 2023-11-26 DIAGNOSIS — E785 Hyperlipidemia, unspecified: Secondary | ICD-10-CM | POA: Diagnosis not present

## 2023-11-26 DIAGNOSIS — W19XXXA Unspecified fall, initial encounter: Secondary | ICD-10-CM

## 2023-11-26 DIAGNOSIS — Z79899 Other long term (current) drug therapy: Secondary | ICD-10-CM | POA: Diagnosis not present

## 2023-11-26 DIAGNOSIS — E890 Postprocedural hypothyroidism: Secondary | ICD-10-CM | POA: Diagnosis not present

## 2023-11-26 DIAGNOSIS — N1831 Chronic kidney disease, stage 3a: Secondary | ICD-10-CM | POA: Diagnosis not present

## 2023-11-26 DIAGNOSIS — E440 Moderate protein-calorie malnutrition: Secondary | ICD-10-CM | POA: Diagnosis present

## 2023-11-26 DIAGNOSIS — R64 Cachexia: Secondary | ICD-10-CM | POA: Diagnosis not present

## 2023-11-26 DIAGNOSIS — Z79890 Hormone replacement therapy (postmenopausal): Secondary | ICD-10-CM | POA: Diagnosis not present

## 2023-11-26 DIAGNOSIS — N179 Acute kidney failure, unspecified: Secondary | ICD-10-CM | POA: Diagnosis present

## 2023-11-26 DIAGNOSIS — I700 Atherosclerosis of aorta: Secondary | ICD-10-CM | POA: Diagnosis not present

## 2023-11-26 DIAGNOSIS — Z6820 Body mass index (BMI) 20.0-20.9, adult: Secondary | ICD-10-CM

## 2023-11-26 DIAGNOSIS — I341 Nonrheumatic mitral (valve) prolapse: Secondary | ICD-10-CM | POA: Diagnosis not present

## 2023-11-26 DIAGNOSIS — E039 Hypothyroidism, unspecified: Secondary | ICD-10-CM

## 2023-11-26 DIAGNOSIS — I361 Nonrheumatic tricuspid (valve) insufficiency: Secondary | ICD-10-CM | POA: Diagnosis not present

## 2023-11-26 DIAGNOSIS — I952 Hypotension due to drugs: Principal | ICD-10-CM | POA: Diagnosis present

## 2023-11-26 DIAGNOSIS — Z888 Allergy status to other drugs, medicaments and biological substances status: Secondary | ICD-10-CM | POA: Diagnosis not present

## 2023-11-26 DIAGNOSIS — Z880 Allergy status to penicillin: Secondary | ICD-10-CM | POA: Diagnosis not present

## 2023-11-26 DIAGNOSIS — R1110 Vomiting, unspecified: Secondary | ICD-10-CM | POA: Diagnosis not present

## 2023-11-26 DIAGNOSIS — W07XXXA Fall from chair, initial encounter: Secondary | ICD-10-CM | POA: Diagnosis present

## 2023-11-26 DIAGNOSIS — S199XXA Unspecified injury of neck, initial encounter: Secondary | ICD-10-CM | POA: Diagnosis not present

## 2023-11-26 DIAGNOSIS — E1122 Type 2 diabetes mellitus with diabetic chronic kidney disease: Secondary | ICD-10-CM | POA: Diagnosis present

## 2023-11-26 DIAGNOSIS — I129 Hypertensive chronic kidney disease with stage 1 through stage 4 chronic kidney disease, or unspecified chronic kidney disease: Secondary | ICD-10-CM | POA: Diagnosis not present

## 2023-11-26 DIAGNOSIS — Z7951 Long term (current) use of inhaled steroids: Secondary | ICD-10-CM

## 2023-11-26 DIAGNOSIS — I059 Rheumatic mitral valve disease, unspecified: Secondary | ICD-10-CM | POA: Insufficient documentation

## 2023-11-26 DIAGNOSIS — E876 Hypokalemia: Secondary | ICD-10-CM | POA: Diagnosis present

## 2023-11-26 DIAGNOSIS — K589 Irritable bowel syndrome without diarrhea: Secondary | ICD-10-CM | POA: Diagnosis present

## 2023-11-26 DIAGNOSIS — N390 Urinary tract infection, site not specified: Secondary | ICD-10-CM | POA: Diagnosis not present

## 2023-11-26 DIAGNOSIS — Z8249 Family history of ischemic heart disease and other diseases of the circulatory system: Secondary | ICD-10-CM

## 2023-11-26 DIAGNOSIS — F411 Generalized anxiety disorder: Secondary | ICD-10-CM | POA: Diagnosis present

## 2023-11-26 DIAGNOSIS — Z87891 Personal history of nicotine dependence: Secondary | ICD-10-CM

## 2023-11-26 DIAGNOSIS — R55 Syncope and collapse: Secondary | ICD-10-CM | POA: Insufficient documentation

## 2023-11-26 DIAGNOSIS — I959 Hypotension, unspecified: Principal | ICD-10-CM | POA: Diagnosis present

## 2023-11-26 DIAGNOSIS — Z882 Allergy status to sulfonamides status: Secondary | ICD-10-CM

## 2023-11-26 DIAGNOSIS — S0990XA Unspecified injury of head, initial encounter: Secondary | ICD-10-CM | POA: Diagnosis not present

## 2023-11-26 DIAGNOSIS — R001 Bradycardia, unspecified: Secondary | ICD-10-CM | POA: Diagnosis not present

## 2023-11-26 DIAGNOSIS — M47812 Spondylosis without myelopathy or radiculopathy, cervical region: Secondary | ICD-10-CM | POA: Diagnosis not present

## 2023-11-26 DIAGNOSIS — I6529 Occlusion and stenosis of unspecified carotid artery: Secondary | ICD-10-CM | POA: Diagnosis not present

## 2023-11-26 DIAGNOSIS — E861 Hypovolemia: Secondary | ICD-10-CM | POA: Diagnosis not present

## 2023-11-26 LAB — CBC WITH DIFFERENTIAL/PLATELET
Abs Immature Granulocytes: 0.05 10*3/uL (ref 0.00–0.07)
Basophils Absolute: 0.1 10*3/uL (ref 0.0–0.1)
Basophils Relative: 1 %
Eosinophils Absolute: 0.2 10*3/uL (ref 0.0–0.5)
Eosinophils Relative: 1 %
HCT: 35.4 % — ABNORMAL LOW (ref 36.0–46.0)
Hemoglobin: 12.2 g/dL (ref 12.0–15.0)
Immature Granulocytes: 0 %
Lymphocytes Relative: 14 %
Lymphs Abs: 1.7 10*3/uL (ref 0.7–4.0)
MCH: 28.8 pg (ref 26.0–34.0)
MCHC: 34.5 g/dL (ref 30.0–36.0)
MCV: 83.5 fL (ref 80.0–100.0)
Monocytes Absolute: 0.7 10*3/uL (ref 0.1–1.0)
Monocytes Relative: 6 %
Neutro Abs: 9.4 10*3/uL — ABNORMAL HIGH (ref 1.7–7.7)
Neutrophils Relative %: 78 %
Platelets: 217 10*3/uL (ref 150–400)
RBC: 4.24 MIL/uL (ref 3.87–5.11)
RDW: 13.3 % (ref 11.5–15.5)
WBC: 12.1 10*3/uL — ABNORMAL HIGH (ref 4.0–10.5)
nRBC: 0 % (ref 0.0–0.2)

## 2023-11-26 LAB — COMPREHENSIVE METABOLIC PANEL WITH GFR
ALT: 12 U/L (ref 0–44)
AST: 24 U/L (ref 15–41)
Albumin: 4.3 g/dL (ref 3.5–5.0)
Alkaline Phosphatase: 128 U/L — ABNORMAL HIGH (ref 38–126)
Anion gap: 13 (ref 5–15)
BUN: 19 mg/dL (ref 8–23)
CO2: 25 mmol/L (ref 22–32)
Calcium: 9.7 mg/dL (ref 8.9–10.3)
Chloride: 99 mmol/L (ref 98–111)
Creatinine, Ser: 2.08 mg/dL — ABNORMAL HIGH (ref 0.44–1.00)
GFR, Estimated: 25 mL/min — ABNORMAL LOW (ref >60)
Glucose, Bld: 115 mg/dL — ABNORMAL HIGH (ref 70–99)
Potassium: 3.4 mmol/L — ABNORMAL LOW (ref 3.5–5.1)
Sodium: 138 mmol/L (ref 135–145)
Total Bilirubin: 0.7 mg/dL (ref 0.0–1.2)
Total Protein: 6.6 g/dL (ref 6.5–8.1)

## 2023-11-26 LAB — URINALYSIS, W/ REFLEX TO CULTURE (INFECTION SUSPECTED)
Bilirubin Urine: NEGATIVE
Glucose, UA: >=500 mg/dL — AB
Ketones, ur: NEGATIVE mg/dL
Nitrite: NEGATIVE
Protein, ur: 30 mg/dL — AB
Specific Gravity, Urine: 1.015 (ref 1.005–1.030)
pH: 5 (ref 5.0–8.0)

## 2023-11-26 LAB — URINE DRUG SCREEN
Amphetamines: NOT DETECTED
Barbiturates: NOT DETECTED
Benzodiazepines: NOT DETECTED
Cocaine: NOT DETECTED
Fentanyl: NOT DETECTED
Methadone Scn, Ur: NOT DETECTED
Opiates: NOT DETECTED
Tetrahydrocannabinol: NOT DETECTED

## 2023-11-26 LAB — CORTISOL: Cortisol, Plasma: 17.6 ug/dL

## 2023-11-26 LAB — MAGNESIUM: Magnesium: 2 mg/dL (ref 1.7–2.4)

## 2023-11-26 LAB — LACTIC ACID, PLASMA: Lactic Acid, Venous: 1.4 mmol/L (ref 0.5–1.9)

## 2023-11-26 LAB — CBG MONITORING, ED: Glucose-Capillary: 112 mg/dL — ABNORMAL HIGH (ref 70–99)

## 2023-11-26 LAB — T4, FREE: Free T4: 1.16 ng/dL — ABNORMAL HIGH (ref 0.61–1.12)

## 2023-11-26 LAB — GLUCOSE, CAPILLARY: Glucose-Capillary: 102 mg/dL — ABNORMAL HIGH (ref 70–99)

## 2023-11-26 LAB — TROPONIN T, HIGH SENSITIVITY
Troponin T High Sensitivity: 22 ng/L — ABNORMAL HIGH (ref <19)
Troponin T High Sensitivity: 17 ng/L (ref <19)

## 2023-11-26 MED ORDER — POLYETHYLENE GLYCOL 3350 17 G PO PACK: 17.0000 g | PACK | Freq: Every day | ORAL | Status: DC | PRN

## 2023-11-26 MED ORDER — HYDROCORTISONE SOD SUC (PF) 100 MG IJ SOLR
100.0000 mg | Freq: Once | INTRAMUSCULAR | Status: AC
  Administered 2023-11-26: 100 mg via INTRAVENOUS
  Filled 2023-11-26: qty 2

## 2023-11-26 MED ORDER — ENOXAPARIN SODIUM 30 MG/0.3ML IJ SOSY
30.0000 mg | PREFILLED_SYRINGE | INTRAMUSCULAR | Status: DC
  Administered 2023-11-27 – 2023-11-29 (×3): 30 mg via SUBCUTANEOUS
  Filled 2023-11-26 (×3): qty 0.3

## 2023-11-26 MED ORDER — NOREPINEPHRINE 4 MG/250ML-% IV SOLN: 0.0000 ug/min | INTRAVENOUS | Status: DC

## 2023-11-26 MED ORDER — ARFORMOTEROL TARTRATE 15 MCG/2ML IN NEBU
15.0000 ug | INHALATION_SOLUTION | Freq: Two times a day (BID) | RESPIRATORY_TRACT | Status: DC
  Administered 2023-11-27 – 2023-11-29 (×6): 15 ug via RESPIRATORY_TRACT
  Filled 2023-11-26 (×6): qty 2

## 2023-11-26 MED ORDER — LACTATED RINGERS IV BOLUS
1000.0000 mL | Freq: Once | INTRAVENOUS | Status: AC
  Administered 2023-11-26: 1000 mL via INTRAVENOUS

## 2023-11-26 MED ORDER — ACETAMINOPHEN 325 MG PO TABS: 650.0000 mg | ORAL_TABLET | ORAL | Status: DC | PRN

## 2023-11-26 MED ORDER — BUDESONIDE 0.25 MG/2ML IN SUSP
0.2500 mg | Freq: Two times a day (BID) | RESPIRATORY_TRACT | Status: DC
  Administered 2023-11-27 – 2023-11-29 (×6): 0.25 mg via RESPIRATORY_TRACT
  Filled 2023-11-26 (×6): qty 2

## 2023-11-26 MED ORDER — VANCOMYCIN HCL IN DEXTROSE 1-5 GM/200ML-% IV SOLN
1000.0000 mg | Freq: Once | INTRAVENOUS | Status: AC
  Administered 2023-11-26: 1000 mg via INTRAVENOUS
  Filled 2023-11-26: qty 200

## 2023-11-26 MED ORDER — IPRATROPIUM-ALBUTEROL 0.5-2.5 (3) MG/3ML IN SOLN
3.0000 mL | Freq: Four times a day (QID) | RESPIRATORY_TRACT | Status: DC
  Administered 2023-11-27: 3 mL via RESPIRATORY_TRACT
  Filled 2023-11-26 (×2): qty 3

## 2023-11-26 MED ORDER — CHLORHEXIDINE GLUCONATE CLOTH 2 % EX PADS
6.0000 | MEDICATED_PAD | Freq: Every day | CUTANEOUS | Status: DC
  Administered 2023-11-27 – 2023-11-28 (×2): 6 via TOPICAL

## 2023-11-26 MED ORDER — INSULIN ASPART 100 UNIT/ML IJ SOLN
0.0000 [IU] | Freq: Three times a day (TID) | INTRAMUSCULAR | Status: DC
  Administered 2023-11-27: 2 [IU] via SUBCUTANEOUS

## 2023-11-26 MED ORDER — SODIUM CHLORIDE 0.9 % IV SOLN
2.0000 g | Freq: Once | INTRAVENOUS | Status: AC
  Administered 2023-11-26: 2 g via INTRAVENOUS
  Filled 2023-11-26: qty 12.5

## 2023-11-26 MED ORDER — NOREPINEPHRINE 4 MG/250ML-% IV SOLN
0.0000 ug/min | INTRAVENOUS | Status: DC
  Administered 2023-11-26: 2 ug/min via INTRAVENOUS
  Filled 2023-11-26: qty 250

## 2023-11-26 MED ORDER — SODIUM CHLORIDE 0.9 % IV SOLN: 250.0000 mL | INTRAVENOUS | Status: AC

## 2023-11-26 MED ORDER — ALBUTEROL SULFATE (2.5 MG/3ML) 0.083% IN NEBU: 2.5000 mg | INHALATION_SOLUTION | RESPIRATORY_TRACT | Status: DC | PRN

## 2023-11-26 MED ORDER — METRONIDAZOLE 500 MG/100ML IV SOLN
500.0000 mg | Freq: Once | INTRAVENOUS | Status: AC
  Administered 2023-11-26: 500 mg via INTRAVENOUS
  Filled 2023-11-26: qty 100

## 2023-11-26 MED ORDER — ORAL CARE MOUTH RINSE: 15.0000 mL | OROMUCOSAL | Status: DC | PRN

## 2023-11-26 NOTE — ED Triage Notes (Signed)
 Pt fell and hit head on dresser about an hour ago; denies LOC; visitor sts pt vomited and was incontinent of stool after fall; fall yesterday while outside; pt A & O x 4 in triage; hypotension noted

## 2023-11-26 NOTE — ED Provider Notes (Signed)
 [place] [facility] Provider Note   CSN: [id number] Arrival date & time: 11/26/23  1617     History {Add pertinent medical, surgical, social history, OB history to HPI:1} Chief Complaint  Patient presents with   Fall   Head Injury   Hypotension    Angelica [name] is a 73 y.o. female.  HPI     73 year old female with a history of hypertension, anxiety and depression, episodic mood disorder, post ablative hypothyroidism, CKD, who presents with concern for episode of near-syncope and fall.  Had a fall yesterday tripping over her dog.  No injuries. Today, was sitting in a chair and began to feel lightheaded, seeing stars like snowflakes everywhere and then fell out of her chair hitting her head on a dresser.  After it occurred she vomited and had diarrhea.  Prior to this did not have any nausea, vomiting nor diarrhea. Eating ok.  No recent illness-no fever, urinary symptoms, cough, congestion, shortness of breath, chest pain, black or bloody stools.  Started farxiga  about 2 weeks ago. No other medication changes. Has been taking her medications including her hydrochlorothiazide  this AM.       Past Medical History:  Diagnosis Date   Allergy    Anxiety    Asthma    Breast cancer (HCC) 2009   Depression    Dyspnea    with heavy pollen    Hypertension    Hypothyroidism    Postmenopausal    Thyroid  disease      Home Medications Prior to Admission medications   Medication Sig Start Date End Date Taking? Authorizing Provider  albuterol  (VENTOLIN  HFA) 108 (90 Base) MCG/ACT inhaler Inhale 2 puffs into the lungs every 6 (six) hours as needed for wheezing. 04/16/23   Breeback, [name] L, PA-C  atorvastatin  (LIPITOR) 80 MG tablet Take 1 tablet (80 mg total) by mouth daily. 09/16/23   Breeback, [name] L, PA-C  budesonide -formoterol  (SYMBICORT ) 160-4.5 MCG/ACT inhaler Inhale 2 puffs into the lungs 2 (two) times daily. 02/14/23   Breeback, [name] L, PA-C   busPIRone  (BUSPAR ) 30 MG tablet Take 1 tablet (30 mg total) by mouth in the morning and at bedtime. 09/15/23   Breeback, [name] L, PA-C  clonazePAM  (KLONOPIN ) 0.5 MG tablet TAKE 1 TABLET BY MOUTH AS NEEDED FOR ANXIETY . DO NOT EXCEED 1 PER 24 HOURS 06/16/23   Breeback, [name] L, PA-C  dapagliflozin  propanediol (FARXIGA ) 10 MG TABS tablet Take 1 tablet (10 mg total) by mouth daily. 09/16/23   Breeback, [name] L, PA-C  dicyclomine  (BENTYL ) 10 MG capsule Take 1 capsule (10 mg total) by mouth 2 (two) times daily. 02/14/23   Breeback, [name] L, PA-C  escitalopram  (LEXAPRO ) 20 MG tablet Take 1 tablet by mouth once daily 09/15/23   Breeback, [name] L, PA-C  fluticasone  (FLONASE ) 50 MCG/ACT nasal spray Use 2 spray(s) in each nostril once daily 11/12/22   Breeback, [name] L, PA-C  hydrochlorothiazide  (HYDRODIURIL ) 12.5 MG tablet Take 1 tablet (12.5 mg total) by mouth daily. 09/07/23   Breeback, [name] L, PA-C  ibuprofen  (ADVIL ) 800 MG tablet Take 1 tablet (800 mg total) by mouth every 8 (eight) hours as needed. 01/25/23   Stephany Ehrich, MD  ipratropium-albuterol  (DUONEB) 0.5-2.5 (3) MG/3ML SOLN Take 3 mLs by nebulization every 4 (four) hours as needed (wheeze, SOB). 12/03/22   Breeback, [name] L, PA-C  lamoTRIgine  (LAMICTAL ) 100 MG tablet Take 1 tablet (100 mg total) by mouth 2 (two) times daily. 09/15/23  Breeback, [name] L, PA-C  levothyroxine  (SYNTHROID ) 50 MCG tablet TAKE 1 TABLET(50 MCG) BY MOUTH DAILY 09/04/23   Breeback, [name] L, PA-C  lisinopril  (ZESTRIL ) 40 MG tablet Take 1 tablet (40 mg total) by mouth daily. 09/15/23   Breeback, [name] L, PA-C  tiZANidine  (ZANAFLEX ) 4 MG tablet Take 1 tablet (4 mg total) by mouth every 8 (eight) hours as needed for muscle spasms. 07/12/23   Small, Brooke L, PA  traZODone  (DESYREL ) 50 MG tablet TAKE 1/2 TO 1 TABLET(25 TO 50 MG) BY MOUTH AT BEDTIME AS NEEDED FOR SLEEP 11/18/23   Breeback, [name] L, PA-C      Allergies    Penicillins, Red dye #40 (allura red), Sulfa antibiotics, Amitriptyline ,  Macrodantin, and Nitrofurantoin    Review of Systems   Review of Systems  Physical Exam Updated Vital Signs BP (!) 88/45   Pulse 70   Temp 98 F (36.7 C) (Oral)   Resp 16   Ht 5\' 3"  (1.6 m)   Wt 58.1 kg   LMP 06/24/2000   SpO2 100%   BMI 22.67 kg/m  Physical Exam  ED Results / Procedures / Treatments   Labs (all labs ordered are listed, but only abnormal results are displayed) Labs Reviewed - No data to display  EKG None  Radiology No results found.  Procedures Procedures  {Document cardiac monitor, telemetry assessment procedure when appropriate:1}  Medications Ordered in ED Medications - No data to display  ED Course/ Medical Decision Making/ A&P   {   Click here for ABCD2, HEART and other calculatorsREFRESH Note before signing :1}                              Medical Decision Making  73 year old female with a history of hypertension, anxiety and depression, episodic mood disorder, post ablative hypothyroidism, CKD, who presents with concern for episode of near-syncope and fall.  DDx for hypotension/lightheadedness includes cardiac arrhythmia, MI, PE, tamponade, electrolyte abnormality, hypovolemia including dehydration and anemia/GI bleed, sepsis, toxic/medication induced, hypothyroidism, adrenal insufficiency.  EKG completed and evaluated by me shows normal sinus rhythm.   Labs completed and personally eval and interpreted by me show no evidence of anemia, mild leukocytosis  {Document critical care time when appropriate:1} {Document review of labs and clinical decision tools ie heart score, Chads2Vasc2 etc:1}  {Document your independent review of radiology images, and any outside records:1} {Document your discussion with family members, caretakers, and with consultants:1} {Document social determinants of health affecting pt's care:1} {Document your decision making why or why not admission, treatments were needed:1} Final Clinical Impression(s) / ED  Diagnoses Final diagnoses:  None    Rx / DC Orders ED Discharge Orders     None

## 2023-11-26 NOTE — Progress Notes (Signed)
 ED Pharmacy Antibiotic Sign Off An antibiotic consult was received from an ED provider for vancomycin and cefepime per pharmacy dosing for sepsis. A chart review was completed to assess appropriateness.   The following one time order(s) were placed:  Vancomycin IV 1000mg  once  Cefepime IV 2g once   Further antibiotic and/or antibiotic pharmacy consults should be ordered by the admitting provider if indicated.   Thank you for allowing pharmacy to be a part of this patient's care.   Harvest Lineman, [facility]  Clinical Pharmacist 11/26/23 5:21 PM

## 2023-11-26 NOTE — ED Notes (Signed)
 Pt placed in trendelenburg

## 2023-11-26 NOTE — ED Notes (Signed)
 Changed BP cuff to a smaller size appropriate to patients arm

## 2023-11-26 NOTE — Progress Notes (Signed)
 [facility] Physician-Brief Progress Note Patient Name: Angelica [name] DOB: 03-28-1951 MRN: [id number]   Date of Service  11/26/2023  HPI/Events of Note  73 y/o lady with pmh of HTN, HLP, DM, CKD, and hypothyroidism who initially presents with near syncope and fall.  She is admitted to the ICU in shock.  Vital signs show bradycardia, wide pulse pressures with hypotension, saturating 98% on room air.  She is on low-dose norepinephrine and has received broad-spectrum antibiotics.  Urinalysis grossly positive.  Results show elevated creatinine, hypokalemia, mild leukocytosis.  Chest radiograph and head imaging reviewed.  eICU Interventions  Maintain norepinephrine for MAP goal greater than 65, status post 4 L of crystalloid infusion  Maintain broad-spectrum antibiotics  Add Duonebs  SCDs, consider chemoprophylaxis No indication for GI prophylaxis     Intervention Category Evaluation Type: New Patient Evaluation  Cuong Moorman 11/26/2023, 10:55 PM

## 2023-11-26 NOTE — ED Notes (Signed)
 Md updated on hypotension. Md at bedside

## 2023-11-26 NOTE — ED Notes (Signed)
 [facility] called for transport.

## 2023-11-26 NOTE — ED Notes (Signed)
Placed pt in trendelenburg position.

## 2023-11-26 NOTE — H&P (Incomplete)
 NAME:  Angelica [name], MRN:  [id number], DOB:  05-09-1951, LOS: 0 ADMISSION DATE:  11/26/2023, CONSULTATION DATE:  11/26/23 REFERRING MD:  ED, CHIEF COMPLAINT:  hypotension   History of Present Illness:  73 y/o lady with pmh of HTN, HLP, DM, CKD, mood disorder, tardive dyskinesia, coming in with history of falls. Reports being in usual state of health until 2 days ago when she had a fall in her yard. She doesn't remember losing consciousness. She did not seek help for this. Today she was getting out of her recliner when she felt her vision go blurry and saw snowflakes in front of her eyes. This was followed by a fall with headstrike but no loss of consciousness. Her daughter requested her to get an evaluation and she presented to ED. In the ED, she was found to be bradycardic and hypotensive. She was given 3-4L fluids with improvement in her blood pressure while fluids were infusing but had refractory hypotension for which she was started on low dose levophed with improvement in blood pressures. She was given a dose of cefepime and vanc and icu was called for admission.  The patient denies any pro-dromal symptoms, focal weakness, chest pain or pressure around these episodes. She denies poor po intake or dehydration. She has not been having any diarrhea or lack of appetite. No sick contacts. She has not had an echo cardiogram that she knows of. No prior history of stroke.  Occasional sneezing that she reports due to to pollen.  She is on hydrochlorothiazide  for hypertension and recently was started on farxiga  by her primary care doctor for renal protection  Per chart check, it appears that patient had a similar episode in January 2025 when she got dizzy while urinating and fell with headstrike.  She is on multiple medications that she doesn't know names of but reports all are in the computer.  Pertinent  Medical History  Depression, hypertension, hyperlipidemia. CKD.   Significant Hospital  Events: Including procedures, antibiotic start and stop dates in addition to other pertinent events   Started on low dose levophed. Currenly infusing at 2mcg/min at time of assessment.   Interim History / Subjective:  Feels better after coming to the icu.   Objective    Blood pressure (!) 116/50, pulse 61, temperature 98.2 F (36.8 C), temperature source Oral, resp. rate 16, height 5\' 3"  (1.6 m), weight 58.1 kg, last menstrual period 06/24/2000, SpO2 97%.        Intake/Output Summary (Last 24 hours) at 11/26/2023 2258 Last data filed at 11/26/2023 2034 Gross per 24 hour  Intake 3218.77 ml  Output --  Net 3218.77 ml   Filed Weights   11/26/23 1634  Weight: 58.1 kg    Examination: General: awake, alert, oriented HENT: eomi, mmmm Lungs: clear to auscultation bilaterally.  Cardiovascular: bradycardia, systolic murmur in left lower sternal border Abdomen: soft, nt, nd, no hsm Extremities: few excoriations on bilateral knees Neuro: awake, alert, oriented, non focal neuro exam GU: not examined.   Resolved problem list   Assessment and Plan  Syncope/hypotension: multifactorial from polypharmacy, dehydration from recent initiation of farxiga  and concomitant hydrochlorothiazide , less likely structural heart disease or symptomatic carotid disease. --obtain echocardiogram, carotid dopplers.  --hold hydrochlorothiazide , farxiga , lisinopril .  --orthostatics once off pressors --low suspicion for infection, but patient has already recevied broad spectrum abx at ED.  --obtain blood cultures, UA not suggestive of infeciton, cxr negative for consolidation. --can switch to ceftriaxone as no recent hospitalization, iv abx  use in 90 days.  --f/u tsh, ft4 levels  --wean pressors as able, can given additional fluid bolus if needed.   Anxiety/mood disorder --verify if patient is on lamictal , resume --verify if patient is on buspar , resume.   Hyperlipidemia --resume atorva.  Hypothyroidism   --resume levothyroxine .    Best Practice (right click and "Reselect all SmartList Selections" daily)   Diet/type: Regular consistency (see orders) DVT prophylaxis LMWH Pressure ulcer(s): N/A GI prophylaxis: N/A Lines: peripheral Foley:  NA Code Status:  full code Last date of multidisciplinary goals of care discussion [-]  Labs   CBC: Recent Labs  Lab 11/26/23 1651  WBC 12.1*  NEUTROABS 9.4*  HGB 12.2  HCT 35.4*  MCV 83.5  PLT 217    Basic Metabolic Panel: Recent Labs  Lab 11/26/23 1651  NA 138  K 3.4*  CL 99  CO2 25  GLUCOSE 115*  BUN 19  CREATININE 2.08*  CALCIUM  9.7  MG 2.0   GFR: Estimated Creatinine Clearance: 19.9 mL/min (A) (by C-G formula based on SCr of 2.08 mg/dL (H)). Recent Labs  Lab 11/26/23 1651 11/26/23 1652  WBC 12.1*  --   LATICACIDVEN  --  1.4    Liver Function Tests: Recent Labs  Lab 11/26/23 1651  AST 24  ALT 12  ALKPHOS 128*  BILITOT 0.7  PROT 6.6  ALBUMIN 4.3   No results for input(s): "LIPASE", "AMYLASE" in the last 168 hours. No results for input(s): "AMMONIA" in the last 168 hours.  ABG No results found for: "PHART", "PCO2ART", "PO2ART", "HCO3", "TCO2", "ACIDBASEDEF", "O2SAT"   Coagulation Profile: No results for input(s): "INR", "PROTIME" in the last 168 hours.  Cardiac Enzymes: No results for input(s): "CKTOTAL", "CKMB", "CKMBINDEX", "TROPONINI" in the last 168 hours.  HbA1C: Hemoglobin A1C  Date/Time Value Ref Range Status  01/31/2011 02:36 PM 5.2  Final    CBG: Recent Labs  Lab 11/26/23 1657 11/26/23 2241  GLUCAP 112* 102*    Review of Systems:   12 point negative except above  Past Medical History:  She,  has a past medical history of Allergy, Anxiety, Asthma, Breast cancer (HCC) (2009), Depression, Dyspnea, Hypertension, Hypothyroidism, Postmenopausal, and Thyroid  disease.   Surgical History:   Past Surgical History:  Procedure Laterality Date   ADENOIDECTOMY     APPENDECTOMY      BREAST RECONSTRUCTION  06/2008   CHOLECYSTECTOMY N/A 11/12/2017   Procedure: LAPAROSCOPIC CHOLECYSTECTOMY WITH INTRAOPERATIVE CHOLANGIOGRAM ERAS PATHWAY;  Surgeon: Sim Dryer, MD;  Location: [facility] OR;  Service: General;  Laterality: N/A;   MASTECTOMY  2009   [facility]   TONSILLECTOMY       Social History:   reports that she quit smoking about 12 years ago. Her smoking use included cigarettes. She started smoking about 57 years ago. She has a 67.5 pack-year smoking history. She has never used smokeless tobacco. She reports that she does not drink alcohol and does not use drugs.   Family History:  Her family history includes Cancer in her mother; Colon cancer in her mother; Hyperlipidemia in her father; Hypertension in her father; Stroke in her mother. There is no history of Colon polyps, Esophageal cancer, Rectal cancer, or Stomach cancer.   Allergies Allergies  Allergen Reactions   Penicillins Hives, Swelling, Rash and Other (See Comments)    PATIENT HAS HAD A PCN REACTION WITH IMMEDIATE RASH, FACIAL/TONGUE/THROAT SWELLING, SOB, OR LIGHTHEADEDNESS WITH HYPOTENSION:  #  #  YES  #  # Has patient had a PCN reaction  causing severe rash involving mucus membranes or skin necrosis: NO PATIENT HAS HAD A PCN REACTION THAT REQUIRED HOSPITALIZATION:  #  #  YES  #  #  Has patient had a PCN reaction occurring within the last 10 years: NO     Red Dye #40 (Allura Red) Hives   Sulfa Antibiotics Nausea And Vomiting and Hives   Amitriptyline  Other (See Comments)    Crazy dreams     Macrodantin Nausea And Vomiting   Nitrofurantoin Nausea And Vomiting     Home Medications  Prior to Admission medications   Medication Sig Start Date End Date Taking? Authorizing Provider  albuterol  (VENTOLIN  HFA) 108 (90 Base) MCG/ACT inhaler Inhale 2 puffs into the lungs every 6 (six) hours as needed for wheezing. 04/16/23   Breeback, [name] L, PA-C  atorvastatin  (LIPITOR) 80 MG tablet Take 1 tablet (80 mg total) by mouth  daily. 09/16/23   Breeback, [name] L, PA-C  budesonide -formoterol  (SYMBICORT ) 160-4.5 MCG/ACT inhaler Inhale 2 puffs into the lungs 2 (two) times daily. 02/14/23   Breeback, [name] L, PA-C  busPIRone  (BUSPAR ) 30 MG tablet Take 1 tablet (30 mg total) by mouth in the morning and at bedtime. 09/15/23   Breeback, [name] L, PA-C  clonazePAM  (KLONOPIN ) 0.5 MG tablet TAKE 1 TABLET BY MOUTH AS NEEDED FOR ANXIETY . DO NOT EXCEED 1 PER 24 HOURS 06/16/23   Breeback, [name] L, PA-C  dapagliflozin  propanediol (FARXIGA ) 10 MG TABS tablet Take 1 tablet (10 mg total) by mouth daily. 09/16/23   Breeback, [name] L, PA-C  dicyclomine  (BENTYL ) 10 MG capsule Take 1 capsule (10 mg total) by mouth 2 (two) times daily. 02/14/23   Breeback, [name] L, PA-C  escitalopram  (LEXAPRO ) 20 MG tablet Take 1 tablet by mouth once daily 09/15/23   Breeback, [name] L, PA-C  fluticasone  (FLONASE ) 50 MCG/ACT nasal spray Use 2 spray(s) in each nostril once daily 11/12/22   Breeback, [name] L, PA-C  hydrochlorothiazide  (HYDRODIURIL ) 12.5 MG tablet Take 1 tablet (12.5 mg total) by mouth daily. 09/07/23   Breeback, [name] L, PA-C  ibuprofen  (ADVIL ) 800 MG tablet Take 1 tablet (800 mg total) by mouth every 8 (eight) hours as needed. 01/25/23   Stephany Ehrich, MD  ipratropium-albuterol  (DUONEB) 0.5-2.5 (3) MG/3ML SOLN Take 3 mLs by nebulization every 4 (four) hours as needed (wheeze, SOB). 12/03/22   Breeback, [name] L, PA-C  lamoTRIgine  (LAMICTAL ) 100 MG tablet Take 1 tablet (100 mg total) by mouth 2 (two) times daily. 09/15/23   Breeback, [name] L, PA-C  levothyroxine  (SYNTHROID ) 50 MCG tablet TAKE 1 TABLET(50 MCG) BY MOUTH DAILY 09/04/23   Breeback, [name] L, PA-C  lisinopril  (ZESTRIL ) 40 MG tablet Take 1 tablet (40 mg total) by mouth daily. 09/15/23   Breeback, [name] L, PA-C  tiZANidine  (ZANAFLEX ) 4 MG tablet Take 1 tablet (4 mg total) by mouth every 8 (eight) hours as needed for muscle spasms. 07/12/23   Small, Brooke L, PA  traZODone  (DESYREL ) 50 MG tablet TAKE 1/2 TO 1  TABLET(25 TO 50 MG) BY MOUTH AT BEDTIME AS NEEDED FOR SLEEP 11/18/23   [name], PA-C     Critical care time: 33 minutes on 11/26/2023

## 2023-11-27 ENCOUNTER — Inpatient Hospital Stay (HOSPITAL_COMMUNITY)

## 2023-11-27 ENCOUNTER — Encounter (HOSPITAL_COMMUNITY): Payer: Self-pay

## 2023-11-27 DIAGNOSIS — I959 Hypotension, unspecified: Secondary | ICD-10-CM

## 2023-11-27 DIAGNOSIS — I10 Essential (primary) hypertension: Secondary | ICD-10-CM

## 2023-11-27 DIAGNOSIS — N179 Acute kidney failure, unspecified: Secondary | ICD-10-CM | POA: Diagnosis not present

## 2023-11-27 DIAGNOSIS — I361 Nonrheumatic tricuspid (valve) insufficiency: Secondary | ICD-10-CM

## 2023-11-27 DIAGNOSIS — R55 Syncope and collapse: Secondary | ICD-10-CM | POA: Diagnosis not present

## 2023-11-27 LAB — PHOSPHORUS
Phosphorus: 2.7 mg/dL (ref 2.5–4.6)
Phosphorus: 3.2 mg/dL (ref 2.5–4.6)

## 2023-11-27 LAB — CBC WITH DIFFERENTIAL/PLATELET
Abs Immature Granulocytes: 0.03 10*3/uL (ref 0.00–0.07)
Basophils Absolute: 0 10*3/uL (ref 0.0–0.1)
Basophils Relative: 0 %
Eosinophils Absolute: 0 10*3/uL (ref 0.0–0.5)
Eosinophils Relative: 0 %
HCT: 30.9 % — ABNORMAL LOW (ref 36.0–46.0)
Hemoglobin: 10.7 g/dL — ABNORMAL LOW (ref 12.0–15.0)
Immature Granulocytes: 0 %
Lymphocytes Relative: 12 %
Lymphs Abs: 1.4 10*3/uL (ref 0.7–4.0)
MCH: 29.3 pg (ref 26.0–34.0)
MCHC: 34.6 g/dL (ref 30.0–36.0)
MCV: 84.7 fL (ref 80.0–100.0)
Monocytes Absolute: 0.3 10*3/uL (ref 0.1–1.0)
Monocytes Relative: 3 %
Neutro Abs: 9.3 10*3/uL — ABNORMAL HIGH (ref 1.7–7.7)
Neutrophils Relative %: 85 %
Platelets: 182 10*3/uL (ref 150–400)
RBC: 3.65 MIL/uL — ABNORMAL LOW (ref 3.87–5.11)
RDW: 13.2 % (ref 11.5–15.5)
WBC: 11 10*3/uL — ABNORMAL HIGH (ref 4.0–10.5)
nRBC: 0 % (ref 0.0–0.2)

## 2023-11-27 LAB — COMPREHENSIVE METABOLIC PANEL WITH GFR
ALT: 10 U/L (ref 0–44)
AST: 19 U/L (ref 15–41)
Albumin: 3 g/dL — ABNORMAL LOW (ref 3.5–5.0)
Alkaline Phosphatase: 69 U/L (ref 38–126)
Anion gap: 10 (ref 5–15)
BUN: 16 mg/dL (ref 8–23)
CO2: 22 mmol/L (ref 22–32)
Calcium: 8.8 mg/dL — ABNORMAL LOW (ref 8.9–10.3)
Chloride: 103 mmol/L (ref 98–111)
Creatinine, Ser: 1.63 mg/dL — ABNORMAL HIGH (ref 0.44–1.00)
GFR, Estimated: 33 mL/min — ABNORMAL LOW (ref >60)
Glucose, Bld: 126 mg/dL — ABNORMAL HIGH (ref 70–99)
Potassium: 3.6 mmol/L (ref 3.5–5.1)
Sodium: 135 mmol/L (ref 135–145)
Total Bilirubin: 1.3 mg/dL — ABNORMAL HIGH (ref 0.0–1.2)
Total Protein: 5.1 g/dL — ABNORMAL LOW (ref 6.5–8.1)

## 2023-11-27 LAB — CBC
HCT: 30.7 % — ABNORMAL LOW (ref 36.0–46.0)
Hemoglobin: 10.6 g/dL — ABNORMAL LOW (ref 12.0–15.0)
MCH: 29.2 pg (ref 26.0–34.0)
MCHC: 34.5 g/dL (ref 30.0–36.0)
MCV: 84.6 fL (ref 80.0–100.0)
Platelets: 172 10*3/uL (ref 150–400)
RBC: 3.63 MIL/uL — ABNORMAL LOW (ref 3.87–5.11)
RDW: 13.2 % (ref 11.5–15.5)
WBC: 8.9 10*3/uL (ref 4.0–10.5)
nRBC: 0 % (ref 0.0–0.2)

## 2023-11-27 LAB — BASIC METABOLIC PANEL WITH GFR
Anion gap: 10 (ref 5–15)
BUN: 14 mg/dL (ref 8–23)
CO2: 23 mmol/L (ref 22–32)
Calcium: 8.9 mg/dL (ref 8.9–10.3)
Chloride: 103 mmol/L (ref 98–111)
Creatinine, Ser: 1.52 mg/dL — ABNORMAL HIGH (ref 0.44–1.00)
GFR, Estimated: 36 mL/min — ABNORMAL LOW (ref >60)
Glucose, Bld: 140 mg/dL — ABNORMAL HIGH (ref 70–99)
Potassium: 3.5 mmol/L (ref 3.5–5.1)
Sodium: 136 mmol/L (ref 135–145)

## 2023-11-27 LAB — GLUCOSE, CAPILLARY
Glucose-Capillary: 173 mg/dL — ABNORMAL HIGH (ref 70–99)
Glucose-Capillary: 119 mg/dL — ABNORMAL HIGH (ref 70–99)
Glucose-Capillary: 90 mg/dL (ref 70–99)
Glucose-Capillary: 104 mg/dL — ABNORMAL HIGH (ref 70–99)

## 2023-11-27 LAB — ECHOCARDIOGRAM COMPLETE
Area-P 1/2: 3.05 cm2
Height: 63 in
S' Lateral: 2.6 cm
Weight: 1841.28 [oz_av]

## 2023-11-27 LAB — MRSA NEXT GEN BY PCR, NASAL: MRSA by PCR Next Gen: NOT DETECTED

## 2023-11-27 LAB — TSH: TSH: 2.175 u[IU]/mL (ref 0.350–4.500)

## 2023-11-27 LAB — CREATININE, URINE, RANDOM: Creatinine, Urine: 47 mg/dL

## 2023-11-27 LAB — PROTIME-INR
INR: 1.2 (ref 0.8–1.2)
Prothrombin Time: 14.9 s (ref 11.4–15.2)

## 2023-11-27 LAB — SODIUM, URINE, RANDOM: Sodium, Ur: 29 mmol/L

## 2023-11-27 LAB — MAGNESIUM
Magnesium: 1.5 mg/dL — ABNORMAL LOW (ref 1.7–2.4)
Magnesium: 2.6 mg/dL — ABNORMAL HIGH (ref 1.7–2.4)

## 2023-11-27 LAB — HEMOGLOBIN A1C
Hgb A1c MFr Bld: 5.5 % (ref 4.8–5.6)
Mean Plasma Glucose: 111.15 mg/dL

## 2023-11-27 LAB — OSMOLALITY, URINE: Osmolality, Ur: 242 mosm/kg — ABNORMAL LOW (ref 300–900)

## 2023-11-27 LAB — LACTIC ACID, PLASMA: Lactic Acid, Venous: 1 mmol/L (ref 0.5–1.9)

## 2023-11-27 MED ORDER — POTASSIUM CHLORIDE CRYS ER 20 MEQ PO TBCR
40.0000 meq | EXTENDED_RELEASE_TABLET | Freq: Three times a day (TID) | ORAL | Status: AC
  Administered 2023-11-27 (×2): 40 meq via ORAL
  Filled 2023-11-27 (×2): qty 2

## 2023-11-27 MED ORDER — MAGNESIUM SULFATE 4 GM/100ML IV SOLN
4.0000 g | Freq: Once | INTRAVENOUS | Status: AC
  Administered 2023-11-27: 4 g via INTRAVENOUS
  Filled 2023-11-27: qty 100

## 2023-11-27 MED ORDER — POTASSIUM CHLORIDE CRYS ER 20 MEQ PO TBCR
40.0000 meq | EXTENDED_RELEASE_TABLET | Freq: Once | ORAL | Status: AC
  Administered 2023-11-27: 40 meq via ORAL
  Filled 2023-11-27: qty 2

## 2023-11-27 MED ORDER — ADULT MULTIVITAMIN W/MINERALS CH
1.0000 | ORAL_TABLET | Freq: Every day | ORAL | Status: DC
  Administered 2023-11-27 – 2023-11-29 (×3): 1 via ORAL
  Filled 2023-11-27 (×3): qty 1

## 2023-11-27 MED ORDER — LACTATED RINGERS IV SOLN: INTRAVENOUS | Status: AC

## 2023-11-27 MED ORDER — ESCITALOPRAM OXALATE 10 MG PO TABS: 20.0000 mg | ORAL_TABLET | Freq: Every day | ORAL | Status: DC

## 2023-11-27 MED ORDER — SODIUM CHLORIDE 0.9 % IV SOLN
2.0000 g | INTRAVENOUS | Status: DC
  Administered 2023-11-27 – 2023-11-28 (×2): 2 g via INTRAVENOUS
  Filled 2023-11-27 (×2): qty 20

## 2023-11-27 MED ORDER — LAMOTRIGINE 100 MG PO TABS
100.0000 mg | ORAL_TABLET | Freq: Two times a day (BID) | ORAL | Status: DC
  Filled 2023-11-27 (×2): qty 1

## 2023-11-27 MED ORDER — MIDODRINE HCL 5 MG PO TABS
5.0000 mg | ORAL_TABLET | Freq: Three times a day (TID) | ORAL | Status: DC
  Administered 2023-11-27: 5 mg via ORAL
  Filled 2023-11-27: qty 1

## 2023-11-27 MED ORDER — LEVOTHYROXINE SODIUM 50 MCG PO TABS
50.0000 ug | ORAL_TABLET | Freq: Every day | ORAL | Status: DC
  Administered 2023-11-27 – 2023-11-29 (×3): 50 ug via ORAL
  Filled 2023-11-27 (×3): qty 1

## 2023-11-27 MED ORDER — MIDODRINE HCL 5 MG PO TABS
10.0000 mg | ORAL_TABLET | Freq: Three times a day (TID) | ORAL | Status: DC
  Administered 2023-11-27: 10 mg via ORAL
  Filled 2023-11-27: qty 2

## 2023-11-27 MED ORDER — ATORVASTATIN CALCIUM 80 MG PO TABS
80.0000 mg | ORAL_TABLET | Freq: Every day | ORAL | Status: DC
  Administered 2023-11-27 – 2023-11-29 (×3): 80 mg via ORAL
  Filled 2023-11-27 (×3): qty 1

## 2023-11-27 MED ORDER — ENSURE PLUS HIGH PROTEIN PO LIQD
237.0000 mL | Freq: Two times a day (BID) | ORAL | Status: DC
  Administered 2023-11-28 (×2): 237 mL via ORAL

## 2023-11-27 MED ORDER — ONDANSETRON HCL 4 MG/2ML IJ SOLN: 4.0000 mg | Freq: Four times a day (QID) | INTRAMUSCULAR | Status: DC | PRN

## 2023-11-27 NOTE — TOC Initial Note (Signed)
 Transition of Care [facility]) - Initial/Assessment Note    Patient Details  Name: Angelica [name] MRN: [id number] Date of Birth: Nov 02, 1950  Transition of Care [facility]) CM/SW Contact:    Benjiman Bras, RN Phone Number: [phone] 11/27/2023, 5:25 PM  Clinical Narrative:                 TOC CM spoke to pt and dtr, Kristi at bedside. Pt was independent at home. States no HH or DME is needed.   Will continue to follow for dc needs.   Expected Discharge Plan: Home/Self Care Barriers to Discharge: Continued Medical Work up   Patient Goals and CMS Choice Patient states their goals for this hospitalization and ongoing recovery are:: wants to remain independent          Expected Discharge Plan and Services   Discharge Planning Services: CM Consult   Living arrangements for the past 2 months: Single Family Home                                      Prior Living Arrangements/Services Living arrangements for the past 2 months: Single Family Home Lives with:: Spouse Patient language and need for interpreter reviewed:: Yes Do you feel safe going back to the place where you live?: Yes      Need for Family Participation in Patient Care: No (Comment) Care giver support system in place?: Yes (comment) Current home services: DME (cane) Criminal Activity/Legal Involvement Pertinent to Current Situation/Hospitalization: No - Comment as needed  Activities of Daily Living   ADL Screening (condition at time of admission) Independently performs ADLs?: Yes (appropriate for developmental age) Is the patient deaf or have difficulty hearing?: No Does the patient have difficulty seeing, even when wearing glasses/contacts?: No Does the patient have difficulty concentrating, remembering, or making decisions?: No  Permission Sought/Granted Permission sought to share information with : Case Manager, Family Supports, PCP Permission granted to share information with : Yes, Verbal Permission  Granted  Share Information with NAME: Brinlee Gambrell  Permission granted to share info w AGENCY: Home Health, DME, PCP  Permission granted to share info w Relationship: husband  Permission granted to share info w Contact Information: [phone]  Emotional Assessment Appearance:: Appears stated age Attitude/Demeanor/Rapport: Engaged Affect (typically observed): Accepting Orientation: : Oriented to Self, Oriented to Place, Oriented to  Time, Oriented to Situation   Psych Involvement: No (comment)  Admission diagnosis:  Hypotension [I95.9] Patient Active Problem List   Diagnosis Date Noted   Hypotension 11/26/2023   CKD (chronic kidney disease) stage 3, GFR 30-59 ml/min (HCC) 09/16/2023   Decreased GFR 06/16/2023   Tardive dyskinesia 05/12/2023   Facial twitching 05/12/2023   Medication side effect 05/12/2023   Stress at home 10/15/2022   Snoring 05/15/2022   Non-restorative sleep 05/15/2022   No energy 05/15/2022   Post-menopausal 05/15/2022   Fall 03/08/2022   Primary osteoarthritis of left knee 12/19/2021   Hirsutism 10/19/2021   Night sweats 10/19/2021   Personal history of breast cancer 10/15/2021   Diverticular disease of colon 10/15/2021   Osteopenia 05/10/2021   Injection site reaction 04/04/2020   Rotator cuff syndrome, left 05/26/2019   Neck fullness 08/31/2018   Colon polyps 03/31/2018   Left-sided nosebleed 03/02/2018   TMJ pain dysfunction syndrome 03/02/2018   Calculus of gallbladder 10/21/2017   Elevated lipase 10/21/2017   Diarrhea 10/21/2017   Upper abdominal  pain 10/21/2017   Cellulitis and abscess of buttock 06/04/2017   Essential hypertension 06/11/2016   Epistaxis 11/14/2015   Trapezius muscle spasm 09/28/2015   Elevated blood pressure 04/26/2015   Dyslipidemia (high LDL; low HDL) 03/09/2015   IBS (irritable bowel syndrome) 08/22/2014   Insomnia 03/15/2014   Chondromalacia of right patellofemoral joint 02/10/2014   Anxiety and depression  08/03/2013   Episodic mood disorder (HCC) 10/17/2012   Breast cancer (HCC) 01/31/2011   GAD (generalized anxiety disorder) 01/31/2011   Asthma 01/31/2011   Seasonal allergies 01/31/2011   Hypothyroid 01/31/2011   PCP:  [name], PA-C Pharmacy:   [facility] [phone] - [place], [place] - [address] AT [facility] [address] [place]  [address] Phone: [phone] Fax: [phone]  [facility], [place] - [address] [place] [address] Phone: [phone] Fax: [phone]     Social Drivers of Health (SDOH) Social History: SDOH Screenings   Food Insecurity: Food Insecurity Present (11/26/2023)  Housing: Low Risk  (11/26/2023)  Transportation Needs: No Transportation Needs (11/26/2023)  Utilities: Not At Risk (11/26/2023)  Alcohol Screen: Low Risk  (04/21/2023)  Depression (PHQ2-9): Medium Risk (09/15/2023)  Financial Resource Strain: Low Risk  (06/16/2023)  Physical Activity: Insufficiently Active (06/16/2023)  Social Connections: Socially Integrated (11/26/2023)  Stress: No Stress Concern Present (06/16/2023)  Tobacco Use: Medium Risk (11/27/2023)  Health Literacy: Adequate Health Literacy (04/21/2023)   SDOH Interventions:     Readmission Risk Interventions     No data to display

## 2023-11-27 NOTE — Progress Notes (Signed)
 [facility] ADULT ICU REPLACEMENT PROTOCOL   The patient does apply for the [facility] Adult ICU Electrolyte Replacment Protocol based on the criteria listed below:   1.Exclusion criteria: TCTS, ECMO, Dialysis, and Myasthenia Gravis patients 2. Is GFR >/= 30 ml/min? Yes.    Patient's GFR today is 33 3. Is SCr </= 2? Yes.   Patient's SCr is 1.63 mg/dL 4. Did SCr increase >/= 0.5 in 24 hours? No. 5.Pt's weight >40kg  Yes.   6. Abnormal electrolyte(s): K+ 3.6, Mag 1.5  7. Electrolytes replaced per protocol 8.  Call MD STAT for K+ </= 2.5, Phos </= 1, or Mag </= 1 Physician:  Renne Casa [facility] 11/27/2023 12:45 AM

## 2023-11-27 NOTE — Consult Note (Addendum)
 [address]       [place] [address]             [phone]        [facility] Medical Record #[id number] Date of Birth: May 18, 1951  Referring: No ref. provider found Primary Care: Kita Perish Primary Cardiologist:None  Chief Complaint:    Chief Complaint  Patient presents with   Fall   Head Injury   Hypotension    History of Present Illness:     Ms. Weide is a 73 year old woman with a past medical history of hypertension, hyperlipidemia, mood disorder, tardive dyskinesia, hypothyroidism, CKD stage III (baseline Cr around 1.1), polypharmacy and falls. She presented to the ED on 06/04 after a fall in her yard 2 days prior and a fall after getting out of her recliner. During that episode she reports she saw "snowflakes" but does not believe she loss consciousness since she remembers seeing things around her like her dog. She did have an episode of vomiting and had incontinent bowels during this episode which had not happened prior. She reports she was in usual state of health prior to that. She does not remember losing consciousness during the first fall and she reports no loss of consciousness with the second fall. She also reports a similar episode in January when she got dizzy while urinating and fell but does not report LOC at that time. In the ED she was found to be hypotensive and bradycardic and was given 3-4L of fluid with improvement in her blood pressure but then developed refractory hypotension for which she was started on low dose levophed. Echocardiogram on 11/27/23 showed LVEF 60-65%, myxomatous mitral valve, mild holosystolic prolapse of the middle segment of the anterior leaflet of the mitral valve, trivial mitral valve regurgitation, no mitral stenosis, and mild tricuspid valve regurgitation. No other valve abnormalities were noted. CT head was without acute intracranial abnormality and CT cervical spine showed mild degenerative  change without fracture. EKG showed NSR without abnormality. Carotid dopplers are pending. She has been started on Midodrine 5mg  TID and Levophed is being weaned as hemodynamics tolerate. It looks like she was 64kg in January and March and is now reported to be 52kg. She reports she has been trying to lose weight with portion control and has lost 25lbs over 4 months. Patient states she currently feels fine.    Current Activity/ Functional Status: Patient is independent with mobility/ambulation, transfers, ADL's, IADL's.   Zubrod Score: At the time of surgery this patient's most appropriate activity status/level should be described as: []     0    Normal activity, no symptoms [x]     1    Restricted in physical strenuous activity but ambulatory, able to do out light work []     2    Ambulatory and capable of self care, unable to do work activities, up and about                 more than 50%  Of the time                            []     3    Only limited self care, in bed greater than 50% of waking hours []     4    Completely disabled, no self care, confined to bed or chair []     5  Moribund  Past Medical History:  Diagnosis Date   Allergy    Anxiety    Asthma    Breast cancer (HCC) 2009   Depression    Dyspnea    with heavy pollen    Hypertension    Hypothyroidism    Postmenopausal    Thyroid  disease     Past Surgical History:  Procedure Laterality Date   ADENOIDECTOMY     APPENDECTOMY     BREAST RECONSTRUCTION  06/2008   CHOLECYSTECTOMY N/A 11/12/2017   Procedure: LAPAROSCOPIC CHOLECYSTECTOMY WITH INTRAOPERATIVE CHOLANGIOGRAM ERAS PATHWAY;  Surgeon: Sim Dryer, MD;  Location: [facility] OR;  Service: General;  Laterality: N/A;   MASTECTOMY  2009   BrCa   TONSILLECTOMY      Social History   Tobacco Use  Smoking Status Former   Current packs/day: 0.00   Average packs/day: 1.5 packs/day for 45.0 years (67.5 ttl pk-yrs)   Types: Cigarettes   Start date: 06/24/1966   Quit date:  06/25/2011   Years since quitting: 12.4  Smokeless Tobacco Never    Social History   Substance and Sexual Activity  Alcohol Use No     Allergies  Allergen Reactions   Penicillins Hives, Swelling, Rash and Other (See Comments)        Red Dye #40 (Allura Red) Hives   Sulfa Antibiotics Nausea And Vomiting and Hives   Amitriptyline  Other (See Comments)    Crazy dreams     Macrodantin Nausea And Vomiting   Nitrofurantoin Nausea And Vomiting    Current Facility-Administered Medications  Medication Dose Route Frequency Provider Last Rate Last Admin   0.9 %  sodium chloride  infusion  250 mL Intravenous Continuous Joshi, Rutwij, MD       acetaminophen  (TYLENOL ) tablet 650 mg  650 mg Oral Q4H PRN Talbot Factor, Rutwij, MD       albuterol  (PROVENTIL ) (2.5 MG/3ML) 0.083% nebulizer solution 2.5 mg  2.5 mg Nebulization Q4H PRN Paliwal, Aditya, MD       arformoterol (BROVANA) nebulizer solution 15 mcg  15 mcg Nebulization BID Paliwal, Aditya, MD   15 mcg at 11/27/23 0806   And   budesonide  (PULMICORT ) nebulizer solution 0.25 mg  0.25 mg Nebulization BID Paliwal, Aditya, MD   0.25 mg at 11/27/23 0805   atorvastatin  (LIPITOR) tablet 80 mg  80 mg Oral Daily Smith, Joshua C, NP       cefTRIAXone (ROCEPHIN) 2 g in sodium chloride  0.9 % 100 mL IVPB  2 g Intravenous Q24H Reome, Earle J, RPH   Stopped at 11/27/23 0315   Chlorhexidine  Gluconate Cloth 2 % PADS 6 each  6 each Topical Daily Joshi, Rutwij, MD       enoxaparin (LOVENOX) injection 30 mg  30 mg Subcutaneous Q24H Young Hensen, RPH   30 mg at 11/27/23 1018   insulin aspart (novoLOG) injection 0-9 Units  0-9 Units Subcutaneous TID WC Joshi, Rutwij, MD   2 Units at 11/27/23 1610   lactated ringers  infusion   Intravenous Continuous Sula End, NP 75 mL/hr at 11/27/23 1300 Infusion Verify at 11/27/23 1300   levothyroxine  (SYNTHROID ) tablet 50 mcg  50 mcg Oral Q0600 Elonda Hale, MD   50 mcg at 11/27/23 0500   midodrine (PROAMATINE) tablet 5 mg  5 mg  Oral TID WC Smith, Joshua C, NP   5 mg at 11/27/23 1134   norepinephrine (LEVOPHED) 4mg  in (0.016 mg/mL) premix infusion  0-10 mcg/min Intravenous Titrated Elonda Hale, MD   Stopped at  11/27/23 1255   Oral care mouth rinse  15 mL Mouth Rinse PRN Talbot Factor, Rutwij, MD       polyethylene glycol (MIRALAX / GLYCOLAX) packet 17 g  17 g Oral Daily PRN Elonda Hale, MD        Medications Prior to Admission  Medication Sig Dispense Refill Last Dose/Taking   albuterol  (VENTOLIN  HFA) 108 (90 Base) MCG/ACT inhaler Inhale 2 puffs into the lungs every 6 (six) hours as needed for wheezing. 8.5 g 11 Past Month   atorvastatin  (LIPITOR) 80 MG tablet Take 1 tablet (80 mg total) by mouth daily. (Patient taking differently: Take 80 mg by mouth at bedtime.) 90 tablet 3 Past Week   budesonide -formoterol  (SYMBICORT ) 160-4.5 MCG/ACT inhaler Inhale 2 puffs into the lungs 2 (two) times daily. (Patient taking differently: Inhale 2 puffs into the lungs 2 (two) times daily as needed (Asthma).) 3 each 3 Unknown   busPIRone  (BUSPAR ) 30 MG tablet Take 1 tablet (30 mg total) by mouth in the morning and at bedtime. 180 tablet 1 11/26/2023   clonazePAM  (KLONOPIN ) 0.5 MG tablet TAKE 1 TABLET BY MOUTH AS NEEDED FOR ANXIETY . DO NOT EXCEED 1 PER 24 HOURS 30 tablet 5 Unknown   dapagliflozin  propanediol (FARXIGA ) 10 MG TABS tablet Take 1 tablet (10 mg total) by mouth daily. 90 tablet 0 11/26/2023   dicyclomine  (BENTYL ) 10 MG capsule Take 1 capsule (10 mg total) by mouth 2 (two) times daily. 180 capsule 3 11/26/2023   diphenhydrAMINE (SOMINEX) 25 MG tablet Take 25 mg by mouth at bedtime as needed for sleep or allergies.   Past Week   escitalopram  (LEXAPRO ) 20 MG tablet Take 1 tablet by mouth once daily 90 tablet 1 11/26/2023   hydrochlorothiazide  (HYDRODIURIL ) 12.5 MG tablet Take 1 tablet (12.5 mg total) by mouth daily. 90 tablet 1 11/26/2023   lamoTRIgine  (LAMICTAL ) 100 MG tablet Take 1 tablet (100 mg total) by mouth 2 (two) times daily. 180  tablet 1 11/26/2023   levothyroxine  (SYNTHROID ) 50 MCG tablet TAKE 1 TABLET(50 MCG) BY MOUTH DAILY 90 tablet 0 11/26/2023   lisinopril  (ZESTRIL ) 40 MG tablet Take 1 tablet (40 mg total) by mouth daily. 90 tablet 1 11/26/2023   tiZANidine  (ZANAFLEX ) 4 MG tablet Take 1 tablet (4 mg total) by mouth every 8 (eight) hours as needed for muscle spasms. 12 tablet 0 Unknown   traZODone  (DESYREL ) 50 MG tablet TAKE 1/2 TO 1 TABLET(25 TO 50 MG) BY MOUTH AT BEDTIME AS NEEDED FOR SLEEP 30 tablet 1 Unknown   ipratropium-albuterol  (DUONEB) 0.5-2.5 (3) MG/3ML SOLN Take 3 mLs by nebulization every 4 (four) hours as needed (wheeze, SOB). 60 mL 1 Unknown    Family History  Problem Relation Age of Onset   Colon cancer Mother    Stroke Mother    Cancer Mother        colon?   Hypertension Father    Hyperlipidemia Father    Colon polyps Neg Hx    Esophageal cancer Neg Hx    Rectal cancer Neg Hx    Stomach cancer Neg Hx      Review of Systems:   Review of Systems  Constitutional:  Positive for chills, malaise/fatigue and weight loss. Negative for diaphoresis and fever.       25lb weight loss over 4 months, trying to lose weight with portion control but appetite has also decreased  HENT:  Negative for hearing loss.   Eyes:  Negative for blurred vision.  Respiratory:  Negative for  cough, sputum production and wheezing.   Cardiovascular:  Negative for chest pain, palpitations, orthopnea and leg swelling.  Gastrointestinal:  Positive for nausea and vomiting. Negative for diarrhea and heartburn.       Vomiting and incontinence of bowels during episode yesterday  Genitourinary:  Positive for dysuria.  Musculoskeletal:  Negative for myalgias.  Skin:        Scratched on her legs from the bushes she fell into a couple days ago  Neurological:  Positive for dizziness and weakness. Negative for seizures.       Patient does not think she lost consciousness but saw "snowflakes" prior to fall and loss control of her bowels  and vomited after fall  Endo/Heme/Allergies:  Does not bruise/bleed easily.  Psychiatric/Behavioral:  Negative for depression and memory loss. The patient is not nervous/anxious.    Physical Exam: BP (!) 99/42   Pulse 60   Temp 98.1 F (36.7 C) (Oral)   Resp 17   Ht 5\' 3"  (1.6 m)   Wt 52.2 kg   LMP 06/24/2000   SpO2 99%   BMI 20.39 kg/m   General appearance: alert, cooperative, and no distress Head: Normocephalic, without obvious abnormality, atraumatic Neck: no carotid bruit, no JVD, supple, symmetrical, trachea midline, thyroid  not enlarged, symmetric, no tenderness/mass/nodules, and cervical adenopathy Lymph nodes: anterior cervical adenopathy, no supraclavicular or posterior cervical adenopathy Resp: clear to auscultation bilaterally Cardio: regular rate and rhythm, S1, S2 normal, no murmur, click, rub or gallop GI: soft, non-tender; bowel sounds normal; no masses,  no organomegaly Extremities: extremities normal, atraumatic, no cyanosis or edema Neurologic: Grossly normal  Diagnostic Studies & Radiology Findings:    ECHOCARDIOGRAM REPORT    Patient Name:   LAQUASHIA MERGENTHALER Date of Exam: 11/27/2023  Medical Rec #:  [id number]      Height:       63.0 in  Accession #:    [id number]     Weight:       115.1 lb  Date of Birth:  [birth year]       BSA:          1.529 m  Patient Age:    73 years       BP:           105/50 mmHg  Patient Gender: F              HR:           63 bpm.  Exam Location:  Inpatient   Procedure: 2D Echo, Cardiac Doppler and Color Doppler (Both Spectral and  Color            Flow Doppler were utilized during procedure).   Indications:    Syncope    History:        Patient has no prior history of Echocardiogram  examinations.                 Signs/Symptoms:Hypotension; Risk Factors:Dyslipidemia and                  Hypertension.    Sonographer:    Janette Medley  Referring Phys: [id number] RUTWIJ JOSHI   IMPRESSIONS     1. Left ventricular ejection  fraction, by estimation, is 60 to 65%. The  left ventricle has normal function. The left ventricle has no regional  wall motion abnormalities. Left ventricular diastolic parameters were  normal.   2. Right ventricular systolic function is normal. The right ventricular  size is normal.  3. The mitral valve is myxomatous. Trivial mitral valve regurgitation. No  evidence of mitral stenosis. There is mild holosystolic prolapse of the  middle segment of the anterior leaflet of the mitral valve.   4. The aortic valve is normal in structure. Aortic valve regurgitation is  not visualized. No aortic stenosis is present.   5. The inferior vena cava is normal in size with greater than 50%  respiratory variability, suggesting right atrial pressure of 3 mmHg.   FINDINGS   Left Ventricle: Left ventricular ejection fraction, by estimation, is 60  to 65%. The left ventricle has normal function. The left ventricle has no  regional wall motion abnormalities. The left ventricular internal cavity  size was normal in size. There is   no left ventricular hypertrophy. Left ventricular diastolic parameters  were normal.   Right Ventricle: The right ventricular size is normal. No increase in  right ventricular wall thickness. Right ventricular systolic function is  normal.   Left Atrium: Left atrial size was normal in size.   Right Atrium: Right atrial size was normal in size.   Pericardium: There is no evidence of pericardial effusion.   Mitral Valve: The mitral valve is myxomatous. There is mild holosystolic  prolapse of the middle segment of the anterior leaflet of the mitral  valve. There is mild thickening of the mitral valve leaflet(s). Mild  mitral annular calcification. Trivial  mitral valve regurgitation. No evidence of mitral valve stenosis.   Tricuspid Valve: The tricuspid valve is normal in structure. Tricuspid  valve regurgitation is mild . No evidence of tricuspid stenosis.   Aortic  Valve: The aortic valve is normal in structure. Aortic valve  regurgitation is not visualized. No aortic stenosis is present.   Pulmonic Valve: The pulmonic valve was normal in structure. Pulmonic valve  regurgitation is not visualized. No evidence of pulmonic stenosis.   Aorta: The aortic root is normal in size and structure.   Venous: The inferior vena cava is normal in size with greater than 50%  respiratory variability, suggesting right atrial pressure of 3 mmHg.   IAS/Shunts: No atrial level shunt detected by color flow Doppler.     LEFT VENTRICLE  PLAX 2D  LVIDd:         4.30 cm   Diastology  LVIDs:         2.60 cm   LV e' medial:    11.20 cm/s  LV PW:         0.90 cm   LV E/e' medial:  10.2  LV IVS:        1.00 cm   LV e' lateral:   10.60 cm/s  LVOT diam:     2.00 cm   LV E/e' lateral: 10.8  LV SV:         79  LV SV Index:   52  LVOT Area:     3.14 cm     RIGHT VENTRICLE             IVC  RV S prime:     15.10 cm/s  IVC diam: 1.90 cm  TAPSE (M-mode): 2.8 cm   LEFT ATRIUM             Index        RIGHT ATRIUM           Index  LA diam:        3.00 cm 1.96 cm/m   RA Area:     13.40 cm  LA Vol (A2C):   30.6 ml 20.02 ml/m  RA Volume:   28.10 ml  18.38 ml/m  LA Vol (A4C):   36.9 ml 24.14 ml/m  LA Biplane Vol: 33.8 ml 22.11 ml/m   AORTIC VALVE  LVOT Vmax:   108.00 cm/s  LVOT Vmean:  72.800 cm/s  LVOT VTI:    0.251 m    AORTA  Ao Asc diam: 3.20 cm   MITRAL VALVE  MV Area (PHT): 3.05 cm     SHUNTS  MV Decel Time: 249 msec     Systemic VTI:  0.25 m  MV E velocity: 114.00 cm/s  Systemic Diam: 2.00 cm  MV A velocity: 73.40 cm/s  MV E/A ratio:  1.55   Dorothye Gathers MD  Electronically signed by Dorothye Gathers MD  Signature Date/Time: 11/27/2023/11:03:29 AM      Final     Assessment & Plan: Mild mitral valve prolapse: I do not suspect this is the cause of her falls and possible LOC. No other valvular disease and EF is normal. Would likely not be a candidate for  surgery for this. Dr. Sherene Dilling to ultimately determine surgical candidacy.  HTN now with Hypotension: HTN meds held. On midodrine, levo being weaned Polypharmacy: Pharmacy reviewing meds Falls/syncope: Carotid dopplers pending, workup per primary team Weight loss: Consult to dietician has been placed CKD stage III Hypothyroid: On synthroid  Mood disorder  Randa Burton, PA-C 11/27/23   Chart reviewed, discussed with patient, agree with above. Her echo shows mild MV prolapse with trivial MR, normal EF and no other valvular problems. This is not the cause of her falls and does not require any further workup. She could have an arrhythmic cause for her falls so it would be worth putting a monitor on her for a while.  Bartley Lightning, MD

## 2023-11-27 NOTE — Progress Notes (Signed)
 NAME:  Angelica [name], MRN:  [id number], DOB:  29-Aug-1950, LOS: 1 ADMISSION DATE:  11/26/2023, CONSULTATION DATE:  11/26/23 REFERRING MD:  ED, CHIEF COMPLAINT:  hypotension   History of Present Illness:  73 y/o lady with pmh of HTN, HLP, DM, CKD, mood disorder, tardive dyskinesia, coming in with history of falls. Reports being in usual state of health until 2 days ago when she had a fall in her yard. She doesn't remember losing consciousness. She did not seek help for this. Today she was getting out of her recliner when she felt her vision go blurry and saw snowflakes in front of her eyes. This was followed by a fall with headstrike but no loss of consciousness. Her daughter requested her to get an evaluation and she presented to ED. In the ED, she was found to be bradycardic and hypotensive. She was given 3-4L fluids with improvement in her blood pressure while fluids were infusing but had refractory hypotension for which she was started on low dose levophed with improvement in blood pressures. She was given a dose of cefepime and vanc and icu was called for admission.  The patient denies any pro-dromal symptoms, focal weakness, chest pain or pressure around these episodes. She denies poor po intake or dehydration. She has not been having any diarrhea or lack of appetite. No sick contacts. She has not had an echo cardiogram that she knows of. No prior history of stroke.  Occasional sneezing that she reports due to to pollen.  She is on hydrochlorothiazide  for hypertension and recently was started on farxiga  by her primary care doctor for renal protection  Per chart check, it appears that patient had a similar episode in January 2025 when she got dizzy while urinating and fell with headstrike.  She is on multiple medications that she doesn't know names of but reports all are in the computer.  Pertinent  Medical History  Depression, hypertension, hyperlipidemia. CKD.   Significant Hospital  Events: Including procedures, antibiotic start and stop dates in addition to other pertinent events   6/4 Started on low dose levophed. Currenly infusing at 2mcg/min at time of assessment.  6/5 Patient on low dose levophed (1-2mcg), starting midodrine and continuous fluids, with a line consult   Interim History / Subjective:  Alert, and oriented x 4, cachetic  Objective    Blood pressure (!) 93/39, pulse 60, temperature 98.1 F (36.7 C), temperature source Oral, resp. rate 16, height 5\' 3"  (1.6 m), weight 52.2 kg, last menstrual period 06/24/2000, SpO2 95%.        Intake/Output Summary (Last 24 hours) at 11/27/2023 1156 Last data filed at 11/27/2023 1000 Gross per 24 hour  Intake 3984.84 ml  Output 1140 ml  Net 2844.84 ml   Filed Weights   11/26/23 1634 11/27/23 0500  Weight: 58.1 kg 52.2 kg    Examination: General: cachetic acute on chronic adult female, sitting up in ICU bed HEENT: Normocephalic, PERRLA intact, Pink MM CV: s1,s2, RRR, no MRG, No JVD  pulm: clear, diminished, no distress on vent  Abs: bs active, soft  Extremities: no edema, no deformity, moves all extremities on command  Skin: no rash  Neuro: Rass 0,follows commands, moves extremities    Resolved problem list   Assessment and Plan  Syncope/hypotension multifactorial- Mild holosystolic prolapse of mitral valve and along with polypharmacy, dehydration from recent initiation of farxiga  and concomitant hydrochlorothiazide   ECHO EF 60-65%, LV normal function, no regional wall abnormalities, RV normal. Mitral valve  is myxomatous- mild holosystolic prolapse of middle segment of anterior leaflet of mitral valve  TSH WNL  P:  Carotid dopplers still pending  Continue to hold hydrochlorothiazide , farxiga , and lisinopril  due to hypotension- will need to reevaluate antihypertensives close discharge Low concern for infection- will obtain procal, consider discontinuing abx if procal low  Continue to wean levophed Add  LR 75ml/hr, and midodrine TID 5mg   RD for nutrition evaluation  TCTs consult to evaluate MVP  Anxiety/mood disorder P: Restart Lamictal , lexapro , buspar - once confirm patient is taking -Pharmacy reviewing meds, appreciate assistance   Hyperlipidemia P: Restart lipitor   Hypothyroidism  P: Continue synthroid     Best Practice (right click and "Reselect all SmartList Selections" daily)   Diet/type: Regular consistency (see orders) DVT prophylaxis LMWH Pressure ulcer(s): N/A GI prophylaxis: N/A Lines: peripheral Foley:  NA Code Status:  full code Last date of multidisciplinary goals of care discussion [-updated patient and husband on 6/5  Critical care time: 40 minutes     Christian Pleas Carneal AGACNP-BC   [facility] 11/27/2023, 12:28 PM  Please see [email or web address] for pager details.  From 7A-7P if no response, please call [phone]. After hours, please call ELink [phone].

## 2023-11-27 NOTE — Progress Notes (Signed)
 Initial Nutrition Assessment  DOCUMENTATION CODES:   Non-severe (moderate) malnutrition in context of acute illness/injury  INTERVENTION:   Continue Regular diet; encouraged pt to select foods that are easiest for her to chew given ill-fitting dentures. Encouraged protein source at each meal  Add MVI with Minerals daily; encouraged pt to continue at discharge  Ensure Enlive po BID, each supplement provides 350 kcal and 20 grams of protein.  Education provided to prevent any further weight loss, reinforced strategies for proper hydration  NUTRITION DIAGNOSIS:   Moderate Malnutrition related to acute illness as evidenced by percent weight loss, mild fat depletion, mild muscle depletion.  GOAL:   Patient will meet greater than or equal to 90% of their needs  MONITOR:   PO intake, Supplement acceptance, Labs, Weight trends  REASON FOR ASSESSMENT:   Consult Assessment of nutrition requirement/status  ASSESSMENT:    73 yo female admitted s/p multiple falls with syncope, pt hypotensive on admission requiring fluid resuscitation. Pt with hx of HTN on hydrochlorothiazide , not eating well and +wt loss. PMH includes HTN, HLD, CKD, depression, tardive dyskinesia   Pt on regular diet; pt did not eat breakfast this AM but reports she does not normally eat breakfast. For lunch, pt ate 2 mango italian ice and drank 8oz shasta cola. Pt tasted her greek yogurt but did not like, ordered cottage cheese but did not eat.   Pt typically does not eat breakfast, just drinks a soda in the morning. Lunch usually consists of a frozen meal. She has an afternoon snack of peanut butter crackers and then the evening meal varies, pt usually cooks the dinner meal. Last night she had beef and a potato. Pt drinks Pepsi during the day, usually 2-20 ounce sodas, and that's about all. Occasionally she drinks ice water.   Pt reports intentional wt loss of 20 pounds in the past 3-4 months. Pt reports she has  utilized reducing portion size as her method for wt loss. Pt reports she eats half the portion size she used to.   Current wt 52.2 kg; pt reports she is happy at her current weight; RD encouraged pt but recommending pt not lose any further weight. Based on weight encounters, pt with 19.5% wt loss in 3 months. Reinforced goal of weight maintenance with nutritional adequacy and hydration. While in acute setting, pt is agreeable to protein shake to preserve lean body mass.   Discussed increasing portion size slightly to prevent any further weight loss; suggested possibly adding a 2nd snack since pt does not like to eat breakfast.   Pt reports she urinates often but urine is dark in color, pt reports it has been this way for a while. Discussed importance of adequate hydration at home, encourage pt to start by drinking 1 glass of ice water daily in addition to her Pepsi.   Pt has been receiving fluid resuscitation; currently receiving LR at 75 ml/hr x 12 hours  Labs: Reviewed  Meds:  Midodrine KCl  NUTRITION - FOCUSED PHYSICAL EXAM:  Flowsheet Row Most Recent Value  Orbital Region Mild depletion  Upper Arm Region No depletion  Thoracic and Lumbar Region No depletion  Buccal Region Mild depletion  Temple Region Mild depletion  Clavicle Bone Region Mild depletion  Clavicle and Acromion Bone Region Mild depletion  Scapular Bone Region Mild depletion  Dorsal Hand Mild depletion  Patellar Region Mild depletion  Anterior Thigh Region Mild depletion  Posterior Calf Region Mild depletion  Edema (RD Assessment) None  Diet Order:   Diet Order             Diet regular Room service appropriate? Yes; Fluid consistency: Thin  Diet effective now                   EDUCATION NEEDS:   Education needs have been addressed  Skin:  Skin Assessment: Reviewed RN Assessment  Last BM:  6/5  Height:   Ht Readings from Last 1 Encounters:  11/26/23 5\' 3"  (1.6 m)    Weight:   Wt  Readings from Last 1 Encounters:  11/27/23 52.2 kg    BMI:  Body mass index is 20.39 kg/m.  Estimated Nutritional Needs:   Kcal:  1500-1600 kcals  Protein:  65-75  g  Fluid:  >/= 1.5 L  Norvel Beer MS, RDN, LDN, CNSC Registered Dietitian 3 Clinical Nutrition RD Inpatient Contact Info in Amion

## 2023-11-27 NOTE — Plan of Care (Signed)
  Problem: Clinical Measurements: Goal: Ability to maintain clinical measurements within normal limits will improve Outcome: Progressing Goal: Diagnostic test results will improve Outcome: Progressing   Problem: Nutrition: Goal: Adequate nutrition will be maintained Outcome: Progressing   Problem: Coping: Goal: Level of anxiety will decrease Outcome: Progressing   

## 2023-11-28 ENCOUNTER — Inpatient Hospital Stay (HOSPITAL_COMMUNITY)

## 2023-11-28 DIAGNOSIS — I341 Nonrheumatic mitral (valve) prolapse: Secondary | ICD-10-CM | POA: Diagnosis not present

## 2023-11-28 DIAGNOSIS — R55 Syncope and collapse: Secondary | ICD-10-CM | POA: Diagnosis not present

## 2023-11-28 DIAGNOSIS — E861 Hypovolemia: Secondary | ICD-10-CM | POA: Diagnosis not present

## 2023-11-28 DIAGNOSIS — E440 Moderate protein-calorie malnutrition: Secondary | ICD-10-CM

## 2023-11-28 LAB — BASIC METABOLIC PANEL WITH GFR
Anion gap: 8 (ref 5–15)
BUN: 9 mg/dL (ref 8–23)
CO2: 24 mmol/L (ref 22–32)
Calcium: 9 mg/dL (ref 8.9–10.3)
Chloride: 107 mmol/L (ref 98–111)
Creatinine, Ser: 1.2 mg/dL — ABNORMAL HIGH (ref 0.44–1.00)
GFR, Estimated: 48 mL/min — ABNORMAL LOW (ref >60)
Glucose, Bld: 94 mg/dL (ref 70–99)
Potassium: 4.6 mmol/L (ref 3.5–5.1)
Sodium: 139 mmol/L (ref 135–145)

## 2023-11-28 LAB — GLUCOSE, CAPILLARY
Glucose-Capillary: 107 mg/dL — ABNORMAL HIGH (ref 70–99)
Glucose-Capillary: 103 mg/dL — ABNORMAL HIGH (ref 70–99)
Glucose-Capillary: 99 mg/dL (ref 70–99)
Glucose-Capillary: 105 mg/dL — ABNORMAL HIGH (ref 70–99)

## 2023-11-28 LAB — CBC
HCT: 31.5 % — ABNORMAL LOW (ref 36.0–46.0)
Hemoglobin: 10.7 g/dL — ABNORMAL LOW (ref 12.0–15.0)
MCH: 28.8 pg (ref 26.0–34.0)
MCHC: 34 g/dL (ref 30.0–36.0)
MCV: 84.7 fL (ref 80.0–100.0)
Platelets: 162 10*3/uL (ref 150–400)
RBC: 3.72 MIL/uL — ABNORMAL LOW (ref 3.87–5.11)
RDW: 13.6 % (ref 11.5–15.5)
WBC: 8 10*3/uL (ref 4.0–10.5)
nRBC: 0 % (ref 0.0–0.2)

## 2023-11-28 LAB — MAGNESIUM: Magnesium: 2 mg/dL (ref 1.7–2.4)

## 2023-11-28 MED ORDER — SODIUM CHLORIDE 0.9 % IV SOLN
1.0000 g | INTRAVENOUS | Status: DC
  Administered 2023-11-29: 1 g via INTRAVENOUS
  Filled 2023-11-28: qty 10

## 2023-11-28 MED ORDER — MIDODRINE HCL 5 MG PO TABS
10.0000 mg | ORAL_TABLET | Freq: Three times a day (TID) | ORAL | Status: DC
  Administered 2023-11-28: 10 mg via ORAL
  Filled 2023-11-28: qty 2

## 2023-11-28 MED ORDER — MIDODRINE HCL 5 MG PO TABS
15.0000 mg | ORAL_TABLET | Freq: Three times a day (TID) | ORAL | Status: DC
  Administered 2023-11-28 (×3): 15 mg via ORAL
  Filled 2023-11-28 (×2): qty 3

## 2023-11-28 NOTE — Progress Notes (Signed)
 eLink Physician-Brief Progress Note Patient Name: CHASITY OUTTEN DOB: 1950/08/09 MRN: [id number]   Date of Service  11/28/2023  HPI/Events of Note  73 year old female with hypertension on HCTZ who was admitted overnight with syncopal episode with fall, noted to be hypotensive requiring IV fluid.   Goal systolic greater than 90 mm dropping just below abdominal 86/48.  Has since resolved 103/40  eICU Interventions  No immediate intervention.  No indication for vasopressors at this time  Increase scheduled dose of midodrine to 15.     Intervention Category Intermediate Interventions: Hypotension - evaluation and management  Luzelena Heeg 11/28/2023, 2:57 AM

## 2023-11-28 NOTE — Plan of Care (Signed)
  Problem: Education: Goal: Knowledge of General Education information will improve Description: Including pain rating scale, medication(s)/side effects and non-pharmacologic comfort measures Outcome: Progressing   Problem: Health Behavior/Discharge Planning: Goal: Ability to manage health-related needs will improve Outcome: Progressing   Problem: Clinical Measurements: Goal: Will remain free from infection Outcome: Progressing Goal: Diagnostic test results will improve Outcome: Progressing Goal: Respiratory complications will improve Outcome: Adequate for Discharge Goal: Cardiovascular complication will be avoided Outcome: Progressing   Problem: Activity: Goal: Risk for activity intolerance will decrease Outcome: Progressing   Problem: Nutrition: Goal: Adequate nutrition will be maintained Outcome: Not Progressing   Problem: Coping: Goal: Level of anxiety will decrease Outcome: Progressing   Problem: Elimination: Goal: Will not experience complications related to bowel motility Outcome: Adequate for Discharge Goal: Will not experience complications related to urinary retention Outcome: Adequate for Discharge

## 2023-11-28 NOTE — Progress Notes (Signed)
 Progress Note   Patient: Angelica [name] VWU:[id number] DOB: 12/13/1950 DOA: 11/26/2023     2 DOS: the patient was seen and examined on 11/28/2023   Brief hospital course: 73 y/o lady with pmh of HTN, HLP, DM, CKD, mood disorder, tardive dyskinesia, coming in with history of falls. Reports being in usual state of health until 2 days ago when she had a fall in her yard   Assessment and Plan: Syncope Hypotension ? Mitral valve disease vs polypharmacy Continue to monitor telemetry. She has been eating poor.  Weaned off levophed.  Held antihypertensives. Continue midodrine dose decreased to 10mg  tid.  Anxiety/mood disorder Restart Lamictal , lexapro , buspar    Hyperlipidemia Restart lipitor    Hypothyroidism  Continue synthroid          Out of bed to chair. Incentive spirometry. Nursing supportive care. Fall, aspiration precautions. Diet:  Diet Orders (From admission, onward)     Start     Ordered   11/26/23 2310  Diet regular Room service appropriate? Yes; Fluid consistency: Thin  Diet effective now       Question Answer Comment  Room service appropriate? Yes   Fluid consistency: Thin      11/26/23 2309           DVT prophylaxis: enoxaparin (LOVENOX) injection 30 mg Start: 11/27/23 1000 SCDs Start: 11/26/23 2250  Level of care: Progressive   Code Status: Full Code  Subjective: Patient is seen and examined today morning. She is lying in bed, wants to go home. BP improved. Did walk with PT. States she is eating better.  Physical Exam: Vitals:   11/28/23 1353 11/28/23 1921 11/28/23 1923 11/28/23 2024  BP: (!) 126/54   (!) 127/48  Pulse: (!) 58 81  (!) 47  Resp: 16 16  18   Temp: 98.1 F (36.7 C)   98.6 F (37 C)  TempSrc: Oral   Oral  SpO2: 94% 99% 99% 100%  Weight:      Height:        General - Elderly Caucasian female, no apparent distress HEENT - PERRLA, EOMI, atraumatic head, non tender sinuses. Lung - Clear, basal rales, rhonchi, no wheezes. Heart -  S1, S2 heard, no murmurs, rubs, trace pedal edema. Abdomen - Soft, non tender, bowel sounds good Neuro - Alert, awake and oriented x 3, non focal exam. Skin - Warm and dry.  Data Reviewed:      Latest Ref Rng & Units 11/28/2023    6:08 AM 11/27/2023    9:21 AM 11/27/2023   12:06 AM  CBC  WBC 4.0 - 10.5 K/uL 8.0  8.9  11.0   Hemoglobin 12.0 - 15.0 g/dL 29.5  62.1  30.8   Hematocrit 36.0 - 46.0 % 31.5  30.7  30.9   Platelets 150 - 400 K/uL 162  172  182       Latest Ref Rng & Units 11/28/2023    6:08 AM 11/27/2023    9:21 AM 11/27/2023   12:06 AM  BMP  Glucose 70 - 99 mg/dL 94  657  846   BUN 8 - 23 mg/dL 9  14  16    Creatinine 0.44 - 1.00 mg/dL 9.62  9.52  8.41   Sodium 135 - 145 mmol/L 139  136  135   Potassium 3.5 - 5.1 mmol/L 4.6  3.5  3.6   Chloride 98 - 111 mmol/L 107  103  103   CO2 22 - 32 mmol/L 24  23  22  Calcium  8.9 - 10.3 mg/dL 9.0  8.9  8.8    VAS US  CAROTID Result Date: 11/28/2023 Carotid Arterial Duplex Study Patient Name:  ABBAGAIL SCAFF  Date of Exam:   11/28/2023 Medical Rec #: [id number]       Accession #:    [id number] Date of Birth: [birth year]        Patient Gender: F Patient Age:   89 years Exam Location:  [facility] Procedure:      VAS US  CAROTID Referring Phys: RUTWIJ JOSHI --------------------------------------------------------------------------------  Indications:       Syncope. Risk Factors:      Hypertension, hyperlipidemia, past history of smoking. Other Factors:     CKD. Comparison Study:  No previous exams Performing Technologist: Jody Hill RVT, RDMS  Examination Guidelines: A complete evaluation includes B-mode imaging, spectral Doppler, color Doppler, and power Doppler as needed of all accessible portions of each vessel. Bilateral testing is considered an integral part of a complete examination. Limited examinations for reoccurring indications may be performed as noted.  Right Carotid Findings:  +----------+--------+--------+--------+------------------+------------------+           PSV cm/sEDV cm/sStenosisPlaque DescriptionComments           +----------+--------+--------+--------+------------------+------------------+ CCA Prox  80      11                                intimal thickening +----------+--------+--------+--------+------------------+------------------+ CCA Distal72      11                                intimal thickening +----------+--------+--------+--------+------------------+------------------+ ICA Prox  65      21                                                   +----------+--------+--------+--------+------------------+------------------+ ICA Distal71      25                                tortuous           +----------+--------+--------+--------+------------------+------------------+ ECA       135     12                                                   +----------+--------+--------+--------+------------------+------------------+ +----------+--------+-------+----------------+-------------------+           PSV cm/sEDV cmsDescribe        Arm Pressure (mmHG) +----------+--------+-------+----------------+-------------------+ [id number]            Multiphasic, WNL                    +----------+--------+-------+----------------+-------------------+ +---------+--------+--+--------+-+---------+ VertebralPSV cm/s34EDV cm/s7Antegrade +---------+--------+--+--------+-+---------+  Left Carotid Findings: +----------+--------+--------+--------+------------------+------------------+           PSV cm/sEDV cm/sStenosisPlaque DescriptionComments           +----------+--------+--------+--------+------------------+------------------+ CCA Prox  74      15                                                   +----------+--------+--------+--------+------------------+------------------+  CCA Distal67      17                                 intimal thickening +----------+--------+--------+--------+------------------+------------------+ ICA Prox  118     28      1-39%   calcific                             +----------+--------+--------+--------+------------------+------------------+ ICA Distal110     22                                tortuous           +----------+--------+--------+--------+------------------+------------------+ ECA       119     9                                                    +----------+--------+--------+--------+------------------+------------------+ +----------+--------+--------+----------------+-------------------+           PSV cm/sEDV cm/sDescribe        Arm Pressure (mmHG) +----------+--------+--------+----------------+-------------------+ [id number]             Multiphasic, WNL                    +----------+--------+--------+----------------+-------------------+ +---------+--------+--+--------+--+---------+ VertebralPSV cm/s65EDV cm/s16Antegrade +---------+--------+--+--------+--+---------+   Summary: Right Carotid: The extracranial vessels were near-normal with only minimal wall                thickening or plaque. Left Carotid: Velocities in the left ICA are consistent with a 1-39% stenosis. Vertebrals:  Bilateral vertebral arteries demonstrate antegrade flow. Subclavians: Normal flow hemodynamics were seen in bilateral subclavian              arteries. *See table(s) above for measurements and observations.  Electronically signed by Delaney Fearing on 11/28/2023 at 6:45:39 PM.    Final    ECHOCARDIOGRAM COMPLETE Result Date: 11/27/2023    ECHOCARDIOGRAM REPORT   Patient Name:   SHAWNYA MAYOR Date of Exam: 11/27/2023 Medical Rec #:  [id number]      Height:       63.0 in Accession #:    [id number]     Weight:       115.1 lb Date of Birth:  05/25/1951       BSA:          1.529 m Patient Age:    73 years       BP:           105/50 mmHg Patient Gender: F              HR:            63 bpm. Exam Location:  Inpatient Procedure: 2D Echo, Cardiac Doppler and Color Doppler (Both Spectral and Color            Flow Doppler were utilized during procedure). Indications:    Syncope  History:        Patient has no prior history of Echocardiogram examinations.                 Signs/Symptoms:Hypotension; Risk Factors:Dyslipidemia and  Hypertension.  Sonographer:    Janette Medley Referring Phys: [id number] RUTWIJ JOSHI IMPRESSIONS  1. Left ventricular ejection fraction, by estimation, is 60 to 65%. The left ventricle has normal function. The left ventricle has no regional wall motion abnormalities. Left ventricular diastolic parameters were normal.  2. Right ventricular systolic function is normal. The right ventricular size is normal.  3. The mitral valve is myxomatous. Trivial mitral valve regurgitation. No evidence of mitral stenosis. There is mild holosystolic prolapse of the middle segment of the anterior leaflet of the mitral valve.  4. The aortic valve is normal in structure. Aortic valve regurgitation is not visualized. No aortic stenosis is present.  5. The inferior vena cava is normal in size with greater than 50% respiratory variability, suggesting right atrial pressure of 3 mmHg. FINDINGS  Left Ventricle: Left ventricular ejection fraction, by estimation, is 60 to 65%. The left ventricle has normal function. The left ventricle has no regional wall motion abnormalities. The left ventricular internal cavity size was normal in size. There is  no left ventricular hypertrophy. Left ventricular diastolic parameters were normal. Right Ventricle: The right ventricular size is normal. No increase in right ventricular wall thickness. Right ventricular systolic function is normal. Left Atrium: Left atrial size was normal in size. Right Atrium: Right atrial size was normal in size. Pericardium: There is no evidence of pericardial effusion. Mitral Valve: The mitral valve is myxomatous. There is  mild holosystolic prolapse of the middle segment of the anterior leaflet of the mitral valve. There is mild thickening of the mitral valve leaflet(s). Mild mitral annular calcification. Trivial mitral valve regurgitation. No evidence of mitral valve stenosis. Tricuspid Valve: The tricuspid valve is normal in structure. Tricuspid valve regurgitation is mild . No evidence of tricuspid stenosis. Aortic Valve: The aortic valve is normal in structure. Aortic valve regurgitation is not visualized. No aortic stenosis is present. Pulmonic Valve: The pulmonic valve was normal in structure. Pulmonic valve regurgitation is not visualized. No evidence of pulmonic stenosis. Aorta: The aortic root is normal in size and structure. Venous: The inferior vena cava is normal in size with greater than 50% respiratory variability, suggesting right atrial pressure of 3 mmHg. IAS/Shunts: No atrial level shunt detected by color flow Doppler.  LEFT VENTRICLE PLAX 2D LVIDd:         4.30 cm   Diastology LVIDs:         2.60 cm   LV e' medial:    11.20 cm/s LV PW:         0.90 cm   LV E/e' medial:  10.2 LV IVS:        1.00 cm   LV e' lateral:   10.60 cm/s LVOT diam:     2.00 cm   LV E/e' lateral: 10.8 LV SV:         79 LV SV Index:   52 LVOT Area:     3.14 cm  RIGHT VENTRICLE             IVC RV S prime:     15.10 cm/s  IVC diam: 1.90 cm TAPSE (M-mode): 2.8 cm LEFT ATRIUM             Index        RIGHT ATRIUM           Index LA diam:        3.00 cm 1.96 cm/m   RA Area:     13.40 cm LA Vol (A2C):  30.6 ml 20.02 ml/m  RA Volume:   28.10 ml  18.38 ml/m LA Vol (A4C):   36.9 ml 24.14 ml/m LA Biplane Vol: 33.8 ml 22.11 ml/m  AORTIC VALVE LVOT Vmax:   108.00 cm/s LVOT Vmean:  72.800 cm/s LVOT VTI:    0.251 m  AORTA Ao Asc diam: 3.20 cm MITRAL VALVE MV Area (PHT): 3.05 cm     SHUNTS MV Decel Time: 249 msec     Systemic VTI:  0.25 m MV E velocity: 114.00 cm/s  Systemic Diam: 2.00 cm MV A velocity: 73.40 cm/s MV E/A ratio:  1.55 Dorothye Gathers MD  Electronically signed by Dorothye Gathers MD Signature Date/Time: 11/27/2023/11:03:29 AM    Final    US  RENAL Result Date: 11/27/2023 EXAM: RETROPERITONEAL ULTRASOUND OF THE KIDNEYS AND URINARY BLADDER TECHNIQUE: Real-time ultrasonography of the retroperitoneum including the kidneys and urinary bladder was performed. COMPARISON: None CLINICAL HISTORY: 846962 AKI (acute kidney injury) (HCC) [id number]. AKI (acute kidney injury) (HCC) [phone] FINDINGS: RIGHT KIDNEY: The right kidney measures 8.8 x 4.9 x 3.8 cm (85 ml) in length. The right kidney demonstrates normal cortical echogenicity. No hydronephrosis or intrarenal stones. LEFT KIDNEY: The left kidney measures 9.6 x 5.3 x 4.1 cm (108 ml) in length. The left kidney demonstrates normal cortical echogenicity. No hydronephrosis or intrarenal stones. BLADDER: Unremarkable appearance of the bladder. IMPRESSION: 1. No acute findings. Electronically signed by: Zadie Herter MD 11/27/2023 03:32 AM EDT RP Workstation: [id number]    Family Communication: Discussed with patient, daughter over phone, understand and agree. All questions answered.  Disposition: Status is: Inpatient Remains inpatient appropriate because: low BP  Planned Discharge Destination: Home and Home with Home Health     Time spent: 40 minutes  Author: Aisha Hove, MD 11/28/2023 11:33 PM Secure chat 7am to 7pm For on call review [email or web address].

## 2023-11-28 NOTE — Evaluation (Signed)
 Physical Therapy Evaluation Patient Details Name: Angelica [name] MRN: [id number] DOB: [birth year] Today's Date: 11/28/2023  History of Present Illness  73 y.o. female presents to [facility] 11/26/23 after a fall 2 days prior and another the day of admit. Felt vision go blurry with head strike and no LOC, CT head w/ no acute findings. Found to have bradycardia and hypotension, admit to ICU. Admitted with syncopal episode and acute UTI. PMHx:  HTN, HLP, DM, CKD, mood disorder, tardive dyskinesia   Clinical Impression  Pt in bed upon arrival and agreeable to PT eval. PTA, pt was independent for mobility with no AD. Pt has had two recent falls where pt describes "seeing snowflakes" before collapsing. In today's session, pt was independent for mobility with ability to ambulate 434ft with no AD. Pt reported no symptoms of dizziness or lightheadedness throughout session. Reported being at baseline for mobility with slightly decreased gait speed. Pt has 24/7 level of assist available at home. Pt has no acute or post acute PT needs at this time. Acute PT signing off. Please re-consult if new needs arise.     If plan is discharge home, recommend the following: Help with stairs or ramp for entrance;Assist for transportation   Can travel by private vehicle    Yes    Equipment Recommendations None recommended by PT     Functional Status Assessment Patient has had a recent decline in their functional status and demonstrates the ability to make significant improvements in function in a reasonable and predictable amount of time.     Precautions / Restrictions Precautions Precautions: Fall Restrictions Weight Bearing Restrictions Per Provider Order: No      Mobility  Bed Mobility Overal bed mobility: Independent   Transfers Overall transfer level: Independent   Ambulation/Gait Ambulation/Gait assistance: Independent Gait Distance (Feet): 400 Feet Assistive device: None Gait Pattern/deviations: WFL(Within  Functional Limits) Gait velocity: decr     General Gait Details: steady gait with no symptoms of dizziness or "seeing snowflakes", reports having slightly decreased gait speed    Balance Overall balance assessment: Independent       Pertinent Vitals/Pain Pain Assessment Pain Assessment: No/denies pain    Home Living Family/patient expects to be discharged to:: Private residence Living Arrangements: Spouse/significant other Available Help at Discharge: Family;Available PRN/intermittently (daughter, husband) Type of Home: House Home Access: Level entry (no steps on back floor)    Home Layout: Two level;Able to live on main level with bedroom/bathroom (laundry upstairs) Home Equipment: Shower seat - built in;Cane - single point (cane with 3 points)      Prior Function Prior Level of Function : Independent/Modified Independent;Driving;History of Falls (last six months)  Mobility Comments: Ind with no AD. Reports "seeing snowflakes" before losing consciousness. Two prior falls due to syncope ADLs Comments: Ind, reports less of an appetite     Extremity/Trunk Assessment   Upper Extremity Assessment Upper Extremity Assessment: Defer to OT evaluation    Lower Extremity Assessment Lower Extremity Assessment: Overall WFL for tasks assessed    Cervical / Trunk Assessment Cervical / Trunk Assessment: Normal  Communication   Communication Communication: No apparent difficulties    Cognition Arousal: Alert Behavior During Therapy: WFL for tasks assessed/performed   PT - Cognitive impairments: No apparent impairments    Following commands: Intact       Cueing Cueing Techniques: Verbal cues     General Comments General comments (skin integrity, edema, etc.): VSS on RA     PT Assessment Patient does not  need any further PT services                Co-evaluation   Reason for Co-Treatment: For patient/therapist safety PT goals addressed during session:  Mobility/safety with mobility;Balance OT goals addressed during session: ADL's and self-care       AM-PAC PT "6 Clicks" Mobility  Outcome Measure Help needed turning from your back to your side while in a flat bed without using bedrails?: None Help needed moving from lying on your back to sitting on the side of a flat bed without using bedrails?: None Help needed moving to and from a bed to a chair (including a wheelchair)?: None Help needed standing up from a chair using your arms (e.g., wheelchair or bedside chair)?: None Help needed to walk in hospital room?: None Help needed climbing 3-5 steps with a railing? : A Little 6 Click Score: 23    End of Session   Activity Tolerance: Patient tolerated treatment well Patient left: in chair;with call bell/phone within reach Nurse Communication: Mobility status PT Visit Diagnosis: Other abnormalities of gait and mobility (R26.89)    Time: 6213-0865 PT Time Calculation (min) (ACUTE ONLY): 20 min   Charges:   PT Evaluation $PT Eval Low Complexity: 1 Low   PT General Charges $$ ACUTE PT VISIT: 1 Visit       Orysia Blas, PT, DPT Secure Chat Preferred  Rehab Office [phone]   Alissa April Adela Ades 11/28/2023, 1:20 PM

## 2023-11-28 NOTE — Progress Notes (Signed)
 Carotid duplex has been completed.   Results can be found under chart review under CV PROC. 11/28/2023 4:32 PM Demetreus Lothamer RVT, RDMS

## 2023-11-28 NOTE — Evaluation (Signed)
 Occupational Therapy Evaluation and Discharge Patient Details Name: Angelica [name] MRN: [id number] DOB: [birth year] Today's Date: 11/28/2023   History of Present Illness   73 y.o. female presents to [facility] 11/26/23 after a fall 2 days prior and another the day of admit. Felt vision go blurry with head strike and no LOC, CT head w/ no acute findings. Found to have bradycardia and hypotension, admit to ICU. Admitted with syncopal episode and acute UTI. PMHx:  HTN, HLP, DM, CKD, mood disorder, tardive dyskinesia     Clinical Impressions Pt without symptoms of syncope. Pt is independent in self care and mobility. No further OT needs.      If plan is discharge home, recommend the following:         Functional Status Assessment   Patient has not had a recent decline in their functional status     Equipment Recommendations   None recommended by OT     Recommendations for Other Services         Precautions/Restrictions   Precautions Precautions: Fall Recall of Precautions/Restrictions: Intact Restrictions Weight Bearing Restrictions Per Provider Order: No     Mobility Bed Mobility Overal bed mobility: Independent                  Transfers Overall transfer level: Independent Equipment used: None                      Balance                                           ADL either performed or assessed with clinical judgement   ADL Overall ADL's : Independent                                             Vision Baseline Vision/History: 1 Wears glasses Ability to See in Adequate Light: 0 Adequate Patient Visual Report: No change from baseline       Perception         Praxis         Pertinent Vitals/Pain Pain Assessment Pain Assessment: No/denies pain     Extremity/Trunk Assessment Upper Extremity Assessment Upper Extremity Assessment: Overall WFL for tasks assessed   Lower Extremity  Assessment Lower Extremity Assessment: Defer to PT evaluation   Cervical / Trunk Assessment Cervical / Trunk Assessment: Normal   Communication Communication Communication: No apparent difficulties   Cognition Arousal: Alert Behavior During Therapy: WFL for tasks assessed/performed Cognition: No apparent impairments                               Following commands: Intact       Cueing  General Comments   Cueing Techniques: Verbal cues  VSS on RA   Exercises     Shoulder Instructions      Home Living Family/patient expects to be discharged to:: Private residence Living Arrangements: Spouse/significant other Available Help at Discharge: Family;Available PRN/intermittently Type of Home: House Home Access: Level entry     Home Layout: Two level;Able to live on main level with bedroom/bathroom (laundry upstairs)     Bathroom Shower/Tub: Producer, television/film/video: Standard     Home  Equipment: Cane - quad;Shower seat - built in          Prior Functioning/Environment Prior Level of Function : Independent/Modified Independent;Driving;History of Falls (last six months)             Mobility Comments: Ind with no AD. Reports "seeing snowflakes" before losing consciousness. Two prior falls due to syncope ADLs Comments: Ind, reports less of an appetite    OT Problem List:     OT Treatment/Interventions:        OT Goals(Current goals can be found in the care plan section)       OT Frequency:       Co-evaluation   Reason for Co-Treatment: For patient/therapist safety PT goals addressed during session: Mobility/safety with mobility;Balance OT goals addressed during session: ADL's and self-care      AM-PAC OT "6 Clicks" Daily Activity     Outcome Measure Help from another person eating meals?: None Help from another person taking care of personal grooming?: None Help from another person toileting, which includes using toliet,  bedpan, or urinal?: None Help from another person bathing (including washing, rinsing, drying)?: None Help from another person to put on and taking off regular upper body clothing?: None Help from another person to put on and taking off regular lower body clothing?: None 6 Click Score: 24   End of Session    Activity Tolerance: Patient tolerated treatment well Patient left: in chair;with call bell/phone within reach  OT Visit Diagnosis: Dizziness and giddiness (R42)                Time: 5621-3086 OT Time Calculation (min): 22 min Charges:  OT General Charges $OT Visit: 1 Visit OT Evaluation $OT Eval Low Complexity: 1 Low  Avanell Leigh, OTR/L Acute Rehabilitation Services Office: [phone]   Jonette Nestle 11/28/2023, 1:47 PM

## 2023-11-29 DIAGNOSIS — N179 Acute kidney failure, unspecified: Secondary | ICD-10-CM | POA: Diagnosis not present

## 2023-11-29 DIAGNOSIS — E440 Moderate protein-calorie malnutrition: Secondary | ICD-10-CM | POA: Diagnosis not present

## 2023-11-29 DIAGNOSIS — R55 Syncope and collapse: Secondary | ICD-10-CM

## 2023-11-29 DIAGNOSIS — I952 Hypotension due to drugs: Principal | ICD-10-CM

## 2023-11-29 DIAGNOSIS — I059 Rheumatic mitral valve disease, unspecified: Secondary | ICD-10-CM

## 2023-11-29 DIAGNOSIS — F411 Generalized anxiety disorder: Secondary | ICD-10-CM

## 2023-11-29 LAB — GLUCOSE, CAPILLARY
Glucose-Capillary: 86 mg/dL (ref 70–99)
Glucose-Capillary: 103 mg/dL — ABNORMAL HIGH (ref 70–99)

## 2023-11-29 MED ORDER — MIDODRINE HCL 5 MG PO TABS: 5.0000 mg | ORAL_TABLET | Freq: Three times a day (TID) | ORAL | 1 refills | Status: DC

## 2023-11-29 MED ORDER — MIDODRINE HCL 5 MG PO TABS
5.0000 mg | ORAL_TABLET | Freq: Three times a day (TID) | ORAL | Status: DC
  Administered 2023-11-29 (×2): 5 mg via ORAL
  Filled 2023-11-29 (×2): qty 1

## 2023-11-29 NOTE — Plan of Care (Signed)

## 2023-11-29 NOTE — Discharge Summary (Signed)
 Physician Discharge Summary   Patient: Angelica [name] MRN: [id number] DOB: 12-Oct-1950  Admit date:     11/26/2023  Discharge date: 11/29/23  Discharge Physician: Aisha Hove   PCP: [name], PA-C   Recommendations at discharge:  {Tip this will not be part of the note when signed- Example include specific recommendations for outpatient follow-up, pending tests to follow-up on. (Optional):26781}  ***  Discharge Diagnoses: Principal Problem:   Hypotension Active Problems:   IBS (irritable bowel syndrome)   Malnutrition of moderate degree  Resolved Problems:   * No resolved hospital problems. [facility] Hospital Course: No notes on file  Assessment and Plan: No notes have been filed under this hospital service. Service: Hospitalist     {Tip this will not be part of the note when signed Body mass index is 23.7 kg/m. ,  Nutrition Documentation    Flowsheet Row ED to Hosp-Admission (Current) from 11/26/2023 in [facility]  Nutrition Problem Moderate Malnutrition  Etiology acute illness  Nutrition Goal Patient will meet greater than or equal to 90% of their needs  Interventions Refer to RD note for recommendations     ,  (Optional):26781}  {(NOTE) Pain control PDMP Statment (Optional):26782} Consultants: *** Procedures performed: ***  Disposition: {Plan; Disposition:26390} Diet recommendation:  Discharge Diet Orders (From admission, onward)     Start     Ordered   11/29/23 0000  Diet - low sodium heart healthy        11/29/23 1217           {Diet_Plan:26776} DISCHARGE MEDICATION: Allergies as of 11/29/2023       Reactions   Penicillins Hives, Swelling, Rash, Other (See Comments)      Red Dye #40 (allura Red) Hives   Sulfa Antibiotics Nausea And Vomiting, Hives   Amitriptyline  Other (See Comments)   Crazy dreams    Macrodantin Nausea And Vomiting   Nitrofurantoin Nausea And Vomiting        Medication List     STOP taking  these medications    hydrochlorothiazide  12.5 MG tablet Commonly known as: HYDRODIURIL    lisinopril  40 MG tablet Commonly known as: ZESTRIL    tiZANidine  4 MG tablet Commonly known as: Zanaflex        TAKE these medications    albuterol  108 (90 Base) MCG/ACT inhaler Commonly known as: VENTOLIN  HFA Inhale 2 puffs into the lungs every 6 (six) hours as needed for wheezing.   atorvastatin  80 MG tablet Commonly known as: LIPITOR Take 1 tablet (80 mg total) by mouth daily. What changed: when to take this   budesonide -formoterol  160-4.5 MCG/ACT inhaler Commonly known as: SYMBICORT  Inhale 2 puffs into the lungs 2 (two) times daily. What changed:  when to take this reasons to take this   busPIRone  30 MG tablet Commonly known as: BUSPAR  Take 1 tablet (30 mg total) by mouth in the morning and at bedtime.   clonazePAM  0.5 MG tablet Commonly known as: KLONOPIN  TAKE 1 TABLET BY MOUTH AS NEEDED FOR ANXIETY . DO NOT EXCEED 1 PER 24 HOURS   dapagliflozin  propanediol 10 MG Tabs tablet Commonly known as: Farxiga  Take 1 tablet (10 mg total) by mouth daily.   dicyclomine  10 MG capsule Commonly known as: BENTYL  Take 1 capsule (10 mg total) by mouth 2 (two) times daily.   diphenhydrAMINE 25 MG tablet Commonly known as: SOMINEX Take 25 mg by mouth at bedtime as needed for sleep or allergies.   escitalopram  20 MG tablet  Commonly known as: LEXAPRO  Take 1 tablet by mouth once daily   ipratropium-albuterol  0.5-2.5 (3) MG/3ML Soln Commonly known as: DUONEB Take 3 mLs by nebulization every 4 (four) hours as needed (wheeze, SOB).   lamoTRIgine  100 MG tablet Commonly known as: LaMICtal  Take 1 tablet (100 mg total) by mouth 2 (two) times daily.   levothyroxine  50 MCG tablet Commonly known as: SYNTHROID  TAKE 1 TABLET(50 MCG) BY MOUTH DAILY   midodrine  5 MG tablet Commonly known as: PROAMATINE  Take 1 tablet (5 mg total) by mouth 3 (three) times daily with meals.   traZODone  50 MG  tablet Commonly known as: DESYREL  TAKE 1/2 TO 1 TABLET(25 TO 50 MG) BY MOUTH AT BEDTIME AS NEEDED FOR SLEEP        Discharge Exam: Filed Weights   11/27/23 0500 11/28/23 0800 11/29/23 0430  Weight: 52.2 kg 60.8 kg 60.7 kg   ***  Condition at discharge: {DC Condition:26389}  The results of significant diagnostics from this hospitalization (including imaging, microbiology, ancillary and laboratory) are listed below for reference.   Imaging Studies: VAS US  CAROTID Result Date: 11/28/2023 Carotid Arterial Duplex Study Patient Name:  Angelica [name]  Date of Exam:   11/28/2023 Medical Rec #: [id number]       Accession #:    [id number] Date of Birth: 1951/03/10        Patient Gender: F Patient Age:   29 years Exam Location:  [facility] Procedure:      VAS US  CAROTID Referring Phys: Elonda Hale --------------------------------------------------------------------------------  Indications:       Syncope. Risk Factors:      Hypertension, hyperlipidemia, past history of smoking. Other Factors:     CKD. Comparison Study:  No previous exams Performing Technologist: Jody Hill RVT, RDMS  Examination Guidelines: A complete evaluation includes B-mode imaging, spectral Doppler, color Doppler, and power Doppler as needed of all accessible portions of each vessel. Bilateral testing is considered an integral part of a complete examination. Limited examinations for reoccurring indications may be performed as noted.  Right Carotid Findings: +----------+--------+--------+--------+------------------+------------------+           PSV cm/sEDV cm/sStenosisPlaque DescriptionComments           +----------+--------+--------+--------+------------------+------------------+ CCA Prox  80      11                                intimal thickening +----------+--------+--------+--------+------------------+------------------+ CCA Distal72      11                                intimal thickening  +----------+--------+--------+--------+------------------+------------------+ ICA Prox  65      21                                                   +----------+--------+--------+--------+------------------+------------------+ ICA Distal71      25                                tortuous           +----------+--------+--------+--------+------------------+------------------+ ECA       135     12                                                   +----------+--------+--------+--------+------------------+------------------+ +----------+--------+-------+----------------+-------------------+  PSV cm/sEDV cmsDescribe        Arm Pressure (mmHG) +----------+--------+-------+----------------+-------------------+ Subclavian109            Multiphasic, WNL                    +----------+--------+-------+----------------+-------------------+ +---------+--------+--+--------+-+---------+ VertebralPSV cm/s34EDV cm/s7Antegrade +---------+--------+--+--------+-+---------+  Left Carotid Findings: +----------+--------+--------+--------+------------------+------------------+           PSV cm/sEDV cm/sStenosisPlaque DescriptionComments           +----------+--------+--------+--------+------------------+------------------+ CCA Prox  74      15                                                   +----------+--------+--------+--------+------------------+------------------+ CCA Distal67      17                                intimal thickening +----------+--------+--------+--------+------------------+------------------+ ICA Prox  118     28      1-39%   calcific                             +----------+--------+--------+--------+------------------+------------------+ ICA Distal110     22                                tortuous           +----------+--------+--------+--------+------------------+------------------+ ECA       119     9                                                     +----------+--------+--------+--------+------------------+------------------+ +----------+--------+--------+----------------+-------------------+           PSV cm/sEDV cm/sDescribe        Arm Pressure (mmHG) +----------+--------+--------+----------------+-------------------+ [id number]             Multiphasic, WNL                    +----------+--------+--------+----------------+-------------------+ +---------+--------+--+--------+--+---------+ VertebralPSV cm/s65EDV cm/s16Antegrade +---------+--------+--+--------+--+---------+   Summary: Right Carotid: The extracranial vessels were near-normal with only minimal wall                thickening or plaque. Left Carotid: Velocities in the left ICA are consistent with a 1-39% stenosis. Vertebrals:  Bilateral vertebral arteries demonstrate antegrade flow. Subclavians: Normal flow hemodynamics were seen in bilateral subclavian              arteries. *See table(s) above for measurements and observations.  Electronically signed by Delaney Fearing on 11/28/2023 at 6:45:39 PM.    Final    ECHOCARDIOGRAM COMPLETE Result Date: 11/27/2023    ECHOCARDIOGRAM REPORT   Patient Name:   KEYLANI PERLSTEIN Date of Exam: 11/27/2023 Medical Rec #:  [id number]      Height:       63.0 in Accession #:    [id number]     Weight:       115.1 lb Date of Birth:  January 06, 1951       BSA:          1.529 m Patient  Age:    73 years       BP:           105/50 mmHg Patient Gender: F              HR:           63 bpm. Exam Location:  Inpatient Procedure: 2D Echo, Cardiac Doppler and Color Doppler (Both Spectral and Color            Flow Doppler were utilized during procedure). Indications:    Syncope  History:        Patient has no prior history of Echocardiogram examinations.                 Signs/Symptoms:Hypotension; Risk Factors:Dyslipidemia and                 Hypertension.  Sonographer:    Janette Medley Referring Phys: [id number] RUTWIJ JOSHI IMPRESSIONS  1. Left  ventricular ejection fraction, by estimation, is 60 to 65%. The left ventricle has normal function. The left ventricle has no regional wall motion abnormalities. Left ventricular diastolic parameters were normal.  2. Right ventricular systolic function is normal. The right ventricular size is normal.  3. The mitral valve is myxomatous. Trivial mitral valve regurgitation. No evidence of mitral stenosis. There is mild holosystolic prolapse of the middle segment of the anterior leaflet of the mitral valve.  4. The aortic valve is normal in structure. Aortic valve regurgitation is not visualized. No aortic stenosis is present.  5. The inferior vena cava is normal in size with greater than 50% respiratory variability, suggesting right atrial pressure of 3 mmHg. FINDINGS  Left Ventricle: Left ventricular ejection fraction, by estimation, is 60 to 65%. The left ventricle has normal function. The left ventricle has no regional wall motion abnormalities. The left ventricular internal cavity size was normal in size. There is  no left ventricular hypertrophy. Left ventricular diastolic parameters were normal. Right Ventricle: The right ventricular size is normal. No increase in right ventricular wall thickness. Right ventricular systolic function is normal. Left Atrium: Left atrial size was normal in size. Right Atrium: Right atrial size was normal in size. Pericardium: There is no evidence of pericardial effusion. Mitral Valve: The mitral valve is myxomatous. There is mild holosystolic prolapse of the middle segment of the anterior leaflet of the mitral valve. There is mild thickening of the mitral valve leaflet(s). Mild mitral annular calcification. Trivial mitral valve regurgitation. No evidence of mitral valve stenosis. Tricuspid Valve: The tricuspid valve is normal in structure. Tricuspid valve regurgitation is mild . No evidence of tricuspid stenosis. Aortic Valve: The aortic valve is normal in structure. Aortic valve  regurgitation is not visualized. No aortic stenosis is present. Pulmonic Valve: The pulmonic valve was normal in structure. Pulmonic valve regurgitation is not visualized. No evidence of pulmonic stenosis. Aorta: The aortic root is normal in size and structure. Venous: The inferior vena cava is normal in size with greater than 50% respiratory variability, suggesting right atrial pressure of 3 mmHg. IAS/Shunts: No atrial level shunt detected by color flow Doppler.  LEFT VENTRICLE PLAX 2D LVIDd:         4.30 cm   Diastology LVIDs:         2.60 cm   LV e' medial:    11.20 cm/s LV PW:         0.90 cm   LV E/e' medial:  10.2 LV IVS:  1.00 cm   LV e' lateral:   10.60 cm/s LVOT diam:     2.00 cm   LV E/e' lateral: 10.8 LV SV:         79 LV SV Index:   52 LVOT Area:     3.14 cm  RIGHT VENTRICLE             IVC RV S prime:     15.10 cm/s  IVC diam: 1.90 cm TAPSE (M-mode): 2.8 cm LEFT ATRIUM             Index        RIGHT ATRIUM           Index LA diam:        3.00 cm 1.96 cm/m   RA Area:     13.40 cm LA Vol (A2C):   30.6 ml 20.02 ml/m  RA Volume:   28.10 ml  18.38 ml/m LA Vol (A4C):   36.9 ml 24.14 ml/m LA Biplane Vol: 33.8 ml 22.11 ml/m  AORTIC VALVE LVOT Vmax:   108.00 cm/s LVOT Vmean:  72.800 cm/s LVOT VTI:    0.251 m  AORTA Ao Asc diam: 3.20 cm MITRAL VALVE MV Area (PHT): 3.05 cm     SHUNTS MV Decel Time: 249 msec     Systemic VTI:  0.25 m MV E velocity: 114.00 cm/s  Systemic Diam: 2.00 cm MV A velocity: 73.40 cm/s MV E/A ratio:  1.55 Dorothye Gathers MD Electronically signed by Dorothye Gathers MD Signature Date/Time: 11/27/2023/11:03:29 AM    Final    US  RENAL Result Date: 11/27/2023 EXAM: RETROPERITONEAL ULTRASOUND OF THE KIDNEYS AND URINARY BLADDER TECHNIQUE: Real-time ultrasonography of the retroperitoneum including the kidneys and urinary bladder was performed. COMPARISON: None CLINICAL HISTORY: 161096 AKI (acute kidney injury) (HCC) 045409. AKI (acute kidney injury) (HCC) [phone] FINDINGS: RIGHT KIDNEY: The  right kidney measures 8.8 x 4.9 x 3.8 cm (85 ml) in length. The right kidney demonstrates normal cortical echogenicity. No hydronephrosis or intrarenal stones. LEFT KIDNEY: The left kidney measures 9.6 x 5.3 x 4.1 cm (108 ml) in length. The left kidney demonstrates normal cortical echogenicity. No hydronephrosis or intrarenal stones. BLADDER: Unremarkable appearance of the bladder. IMPRESSION: 1. No acute findings. Electronically signed by: Zadie Herter MD 11/27/2023 03:32 AM EDT RP Workstation: [id number]   CT Cervical Spine Wo Contrast Result Date: 11/26/2023 CLINICAL DATA:  Neck trauma (Age >= 65y) EXAM: CT CERVICAL SPINE WITHOUT CONTRAST TECHNIQUE: Multidetector CT imaging of the cervical spine was performed without intravenous contrast. Multiplanar CT image reconstructions were also generated. RADIATION DOSE REDUCTION: This exam was performed according to the departmental dose-optimization program which includes automated exposure control, adjustment of the mA and/or kV according to patient size and/or use of iterative reconstruction technique. COMPARISON:  12/08/2021 FINDINGS: Alignment: Normal. Skull base and vertebrae: No acute fracture. Vertebral body heights are maintained. The dens and skull base are intact. Soft tissues and spinal canal: No prevertebral fluid or swelling. No visible canal hematoma. Disc levels: Minor anterior spurring at C6-C7. Mild multilevel facet hypertrophy. Upper chest: No acute findings. Other: Carotid calcifications. IMPRESSION: Mild degenerative change in the cervical spine without acute fracture or subluxation. Electronically Signed   By: Chadwick Colonel M.D.   On: 11/26/2023 18:46   CT Head Wo Contrast Result Date: 11/26/2023 CLINICAL DATA:  Head trauma, minor (Age >= 65y) Fall today hitting head on dresser.  Also fell yesterday. EXAM: CT HEAD WITHOUT CONTRAST TECHNIQUE: Contiguous axial images were obtained  from the base of the skull through the vertex without  intravenous contrast. RADIATION DOSE REDUCTION: This exam was performed according to the departmental dose-optimization program which includes automated exposure control, adjustment of the mA and/or kV according to patient size and/or use of iterative reconstruction technique. COMPARISON:  Head CT 07/12/2023 FINDINGS: Brain: No intracranial hemorrhage, mass effect, or midline shift. No hydrocephalus. The basilar cisterns are patent. No evidence of territorial infarct or acute ischemia. No extra-axial or intracranial fluid collection. Vascular: Atherosclerosis of skullbase vasculature without hyperdense vessel or abnormal calcification. Skull: No fracture or focal lesion. Sinuses/Orbits: No fracture or focal lesion. Other: None. IMPRESSION: No acute intracranial abnormality. No skull fracture. Electronically Signed   By: Chadwick Colonel M.D.   On: 11/26/2023 18:44   DG Chest Portable 1 View Result Date: 11/26/2023 CLINICAL DATA:  Hypotension. Patient fell about an hour ago. Vomiting and incontinence of stool after the fall. EXAM: PORTABLE CHEST 1 VIEW COMPARISON:  12/03/2022 FINDINGS: Heart size and pulmonary vascularity are normal. Soft tissue attenuation over the right lung likely representing breast implant. Lungs appear clear and expanded. No airspace disease or consolidation. No pleural effusion or pneumothorax. Mediastinal contours appear intact. Calcification of the aorta. Degenerative changes in the spine and shoulders. Surgical clips in the right axilla. IMPRESSION: No active disease. Electronically Signed   By: Boyce Byes M.D.   On: 11/26/2023 18:27    Microbiology: Results for orders placed or performed during the hospital encounter of 11/26/23  MRSA Next Gen by PCR, Nasal     Status: None   Collection Time: 11/26/23 10:43 PM   Specimen: Nasal Mucosa; Nasal Swab  Result Value Ref Range Status   MRSA by PCR Next Gen NOT DETECTED NOT DETECTED Final    Comment: (NOTE) The GeneXpert MRSA  Assay (FDA approved for NASAL specimens only), is one component of a comprehensive MRSA colonization surveillance program. It is not intended to diagnose MRSA infection nor to guide or monitor treatment for MRSA infections. Test performance is not FDA approved in patients less than 34 years old. Performed at [facility], [address] [place], [place] [address]   Culture, blood (Routine X 2) w Reflex to ID Panel     Status: None (Preliminary result)   Collection Time: 11/27/23 12:02 AM   Specimen: BLOOD RIGHT HAND  Result Value Ref Range Status   Specimen Description BLOOD RIGHT HAND  Final   Special Requests   Final    BOTTLES DRAWN AEROBIC AND ANAEROBIC Blood Culture results may not be optimal due to an inadequate volume of blood received in culture bottles   Culture   Final    NO GROWTH 2 DAYS Performed at [facility], [address] [place], [place] [address]    Report Status PENDING  Incomplete  Culture, blood (Routine X 2) w Reflex to ID Panel     Status: None (Preliminary result)   Collection Time: 11/27/23 12:06 AM   Specimen: BLOOD RIGHT HAND  Result Value Ref Range Status   Specimen Description BLOOD RIGHT HAND  Final   Special Requests   Final    BOTTLES DRAWN AEROBIC AND ANAEROBIC Blood Culture adequate volume   Culture   Final    NO GROWTH 2 DAYS Performed at [facility], [address] [place], [place] [address]    Report Status PENDING  Incomplete    Labs: CBC: Recent Labs  Lab 11/26/23 1651 11/27/23 0006 11/27/23 9562 11/28/23  4782  WBC 12.1* 11.0* 8.9 8.0  NEUTROABS 9.4* 9.3*  --   --   HGB 12.2 10.7* 10.6* 10.7*  HCT 35.4* 30.9* 30.7* 31.5*  MCV 83.5 84.7 84.6 84.7  PLT 217 182 172 162   Basic Metabolic Panel: Recent Labs  Lab 11/26/23 1651 11/27/23 0006 11/27/23 0921 11/28/23 0608  NA 138 135 136 139  K 3.4* 3.6 3.5 4.6  CL 99 103 103 107  CO2 25 22 23 24   GLUCOSE 115* 126* 140* 94  BUN 19 16 14 9   CREATININE  2.08* 1.63* 1.52* 1.20*  CALCIUM  9.7 8.8* 8.9 9.0  MG 2.0 1.5* 2.6* 2.0  PHOS  --  2.7 3.2  --    Liver Function Tests: Recent Labs  Lab 11/26/23 1651 11/27/23 0006  AST 24 19  ALT 12 10  ALKPHOS 128* 69  BILITOT 0.7 1.3*  PROT 6.6 5.1*  ALBUMIN 4.3 3.0*   CBG: Recent Labs  Lab 11/28/23 1149 11/28/23 1625 11/28/23 2139 11/29/23 0807 11/29/23 1208  GLUCAP 103* 99 105* 86 103*    Discharge time spent: {LESS THAN/GREATER THAN:26388} 30 minutes.  Signed: Aisha Hove, MD [facility] 11/29/2023

## 2023-11-30 DIAGNOSIS — N179 Acute kidney failure, unspecified: Secondary | ICD-10-CM | POA: Insufficient documentation

## 2023-11-30 DIAGNOSIS — R10816 Epigastric abdominal tenderness: Secondary | ICD-10-CM | POA: Diagnosis not present

## 2023-11-30 DIAGNOSIS — R55 Syncope and collapse: Secondary | ICD-10-CM | POA: Insufficient documentation

## 2023-11-30 DIAGNOSIS — R109 Unspecified abdominal pain: Secondary | ICD-10-CM | POA: Diagnosis not present

## 2023-11-30 DIAGNOSIS — J90 Pleural effusion, not elsewhere classified: Secondary | ICD-10-CM | POA: Diagnosis not present

## 2023-11-30 DIAGNOSIS — E079 Disorder of thyroid, unspecified: Secondary | ICD-10-CM | POA: Diagnosis not present

## 2023-11-30 DIAGNOSIS — Z8679 Personal history of other diseases of the circulatory system: Secondary | ICD-10-CM | POA: Diagnosis not present

## 2023-11-30 DIAGNOSIS — I10 Essential (primary) hypertension: Secondary | ICD-10-CM | POA: Diagnosis not present

## 2023-11-30 DIAGNOSIS — R112 Nausea with vomiting, unspecified: Secondary | ICD-10-CM | POA: Diagnosis not present

## 2023-11-30 DIAGNOSIS — Z7982 Long term (current) use of aspirin: Secondary | ICD-10-CM | POA: Diagnosis not present

## 2023-11-30 DIAGNOSIS — I059 Rheumatic mitral valve disease, unspecified: Secondary | ICD-10-CM | POA: Insufficient documentation

## 2023-11-30 DIAGNOSIS — R531 Weakness: Secondary | ICD-10-CM | POA: Diagnosis not present

## 2023-11-30 DIAGNOSIS — Z87891 Personal history of nicotine dependence: Secondary | ICD-10-CM | POA: Diagnosis not present

## 2023-12-01 ENCOUNTER — Telehealth: Payer: Self-pay

## 2023-12-01 NOTE — Transitions of Care (Post Inpatient/ED Visit) (Signed)
   12/01/2023  Name: Angelica [name] MRN: [id number] DOB: [birth year]  Today's TOC FU Call Status: Today's TOC FU Call Status:: Successful TOC FU Call Completed TOC FU Call Complete Date: 12/01/23 (VM from patient/call back to patient-spoke with patient who states she does not feel she needs the [facility] calls - states she has hospital follow up with PCP and manages her medications herself and states she is doing okay and is eating/drinking) Patient declined TOC program  Patient's Name and Date of Birth confirmed.  Transition Care Management Follow-up Telephone Call   Tonia Frankel RN, CCM [facility]  VBCI-Population Health RN Care Manager [phone]

## 2023-12-01 NOTE — Transitions of Care (Post Inpatient/ED Visit) (Signed)
   12/01/2023  Name: Angelica [name] MRN: [id number] DOB: 1951-03-26  Today's TOC FU Call Status: Today's TOC FU Call Status:: Unsuccessful Call (1st Attempt) Unsuccessful Call (1st Attempt) Date: 12/01/23  Attempted to reach the patient regarding the most recent Inpatient/ED visit.  Follow Up Plan: Additional outreach attempts will be made to reach the patient to complete the Transitions of Care (Post Inpatient/ED visit) call.   Tonia Frankel RN, CCM [facility]  VBCI-Population Health RN Care Manager [phone]

## 2023-12-01 NOTE — Transitions of Care (Post Inpatient/ED Visit) (Signed)
   12/01/2023  Name: Angelica [name] MRN: [id number] DOB: Feb 17, 1951  Today's TOC FU Call Status: Today's TOC FU Call Status:: Unsuccessful Call (2nd Attempt) Unsuccessful Call (2nd Attempt) Date: 12/01/23  Attempted to reach the patient regarding the most recent Inpatient/ED visit.  Follow Up Plan: Additional outreach attempts will be made to reach the patient to complete the Transitions of Care (Post Inpatient/ED visit) call.   Tonia Frankel RN, CCM [facility]  VBCI-Population Health RN Care Manager [phone]

## 2023-12-02 LAB — CULTURE, BLOOD (ROUTINE X 2)
Culture: NO GROWTH
Culture: NO GROWTH
Special Requests: ADEQUATE

## 2023-12-03 ENCOUNTER — Ambulatory Visit (INDEPENDENT_AMBULATORY_CARE_PROVIDER_SITE_OTHER): Admitting: Physician Assistant

## 2023-12-03 ENCOUNTER — Encounter: Payer: Self-pay | Admitting: Physician Assistant

## 2023-12-03 VITALS — BP 114/49 | HR 74 | Ht 63.0 in | Wt 131.0 lb

## 2023-12-03 DIAGNOSIS — I959 Hypotension, unspecified: Secondary | ICD-10-CM

## 2023-12-03 DIAGNOSIS — T887XXA Unspecified adverse effect of drug or medicament, initial encounter: Secondary | ICD-10-CM

## 2023-12-03 DIAGNOSIS — E890 Postprocedural hypothyroidism: Secondary | ICD-10-CM

## 2023-12-03 DIAGNOSIS — Z09 Encounter for follow-up examination after completed treatment for conditions other than malignant neoplasm: Secondary | ICD-10-CM | POA: Diagnosis not present

## 2023-12-03 DIAGNOSIS — L509 Urticaria, unspecified: Secondary | ICD-10-CM | POA: Diagnosis not present

## 2023-12-03 DIAGNOSIS — I951 Orthostatic hypotension: Secondary | ICD-10-CM | POA: Diagnosis not present

## 2023-12-03 DIAGNOSIS — N179 Acute kidney failure, unspecified: Secondary | ICD-10-CM | POA: Diagnosis not present

## 2023-12-03 MED ORDER — HYDROXYZINE HCL 10 MG PO TABS: ORAL_TABLET | ORAL | 0 refills | Status: DC

## 2023-12-03 MED ORDER — METHYLPREDNISOLONE SODIUM SUCC 125 MG IJ SOLR
125.0000 mg | Freq: Once | INTRAMUSCULAR | Status: AC
  Administered 2023-12-03: 125 mg via INTRAMUSCULAR

## 2023-12-03 MED ORDER — LEVOTHYROXINE SODIUM 50 MCG PO TABS: 50.0000 ug | ORAL_TABLET | Freq: Every day | ORAL | 1 refills | Status: DC

## 2023-12-03 NOTE — Progress Notes (Signed)
 Established Patient Office Visit  Subjective   Patient ID: Angelica [name], female    DOB: 1950-09-01  Age: 73 y.o. MRN: [id number]  Chief Complaint  Patient presents with   Medical Management of Chronic Issues    HPI Angelica [name] is here today for a hospital follow up. She Presented to the Emergency Department after she fell standing up to out of a recliner on 11/26/23. She hit her head but did not lose consciousness. In the ED she was found to be bradycardic and hypotensive; her hypotension was refractory to 4L of fluid so she was placed on levophed  and admitted to the ICU. She also received a dose of Cefepime  and Vancomycin  in the ED.   ECHO on 6/5 showed LVEF 60-65%, myxomatous mitral valve, mild holosystolic prolapse and trivial mitral valve regurgitation and no mitral stenosis. Carotid doppler showed  a 1-39% stenosis in the left carotid artery. Renal US  with no abnormalities. CT cervical spine showed mild degenerative changes in the cervical spine with no acute fracture or subluxation. CT head revealed no acute intracranial abnormality or skull fracture. CXR was normal. Blood cultures negative. HGB went from 12.2 at admission to 10.7 at discharge and Hct from 35.4 to 31.5. Creatinine was 2.08 upon admission and 1.20 at discharge (her baseline is 1.1). Protein 6.6 at admission and 5.1 at discharge. Albumin 4.3 on admission and 3.0 at discharge. Her primary diagnosis was hypotension and she was discharged on 11/29/23. She was started on midodrine . She was instructed to stop hydrochlorothiazide , lisinopril , tizanidine .   A few hours after discharge on 6/7 she began to experience nausea, vomiting and hives. She then realized that the midodrine  has Red 40 in it, and she is allergic. She was still feeling unwell the next day so went to [facility] ED for persistent nausea and vomiting on 6/08. She received a CT abdomen and pelvis with contrast that showed no abnormalities in the abdomen or  pelvis but a trace right pleural effusion. She had no difficulty breathing. CBC was normal. CMP largely normal with Albumin 5.5, BUN/Cr 5.4 being the only abnormalities. eGFR was 66.  She was discharged  later that day with Zofran  to take as needed.    Since then she has been feeling much better. She has tried to eat more small meals and has started drinking more water and Gatorade. No falls since. She has been keeping a blood pressure log. She still has hives. She took two doses of benadryl last night but this did not help. Denies shortness of breath, weakness, loss of consciousness, fever. She has been working on weight loss and lost almost 13lbs since March 2025.   Review of Systems  Constitutional:  Negative for chills, fever and malaise/fatigue.  Respiratory:  Negative for cough and shortness of breath.   Cardiovascular:  Negative for chest pain and palpitations.  Gastrointestinal:  Negative for diarrhea, nausea and vomiting.  Skin:  Positive for itching and rash.  Neurological:  Negative for dizziness, loss of consciousness, weakness and headaches.      Objective:     BP (!) 114/49   Pulse 74   Ht 5' 3 (1.6 m)   Wt 131 lb (59.4 kg)   LMP 06/24/2000   SpO2 99%   BMI 23.21 kg/m  BP Readings from Last 3 Encounters:  12/03/23 (!) 114/49  11/29/23 (!) 100/42  09/15/23 135/80   Wt Readings from Last 3 Encounters:  12/03/23 131 lb (59.4 kg)  11/29/23 133  lb 12.8 oz (60.7 kg)  09/15/23 143 lb (64.9 kg)      Physical Exam Constitutional:      General: She is not in acute distress.    Appearance: Normal appearance.  Cardiovascular:     Rate and Rhythm: Normal rate and regular rhythm.     Heart sounds: Normal heart sounds.  Pulmonary:     Effort: Pulmonary effort is normal. No respiratory distress.     Breath sounds: Normal breath sounds. No wheezing, rhonchi or rales.  Skin:    General: Skin is warm and dry.     Findings: Rash present. Rash is urticarial.     Comments:  Urticarial rash on the bilateral upper extremities, neck and trunk.  Neurological:     Mental Status: She is alert.    Aaron Aas.Orthostatic Vitals for the past 48 hrs (Last 6 readings):  BP Pulse Patient Position (if appropriate) BP- Standing at 0 minutes Pulse- Standing at 0 minutes BP- Sitting Pulse- Sitting BP- Lying Pulse- Lying  12/03/23 1031 (!) 114/49 74 -- -- -- -- -- -- --  12/03/23 1329 -- -- Orthostatic Vitals 134/54 54 134/64 65 126/47 (!) 47     The 10-year ASCVD risk score (Arnett DK, et al., 2019) is: 14%    Assessment & Plan:  Aaron AasAaron AasMone was seen today for medical management of chronic issues.  Diagnoses and all orders for this visit:  Hospital discharge follow-up  Orthostatic hypotension  Medication side effect  Hives -     methylPREDNISolone  sodium succinate  (SOLU-MEDROL ) 125 mg/2 mL injection 125 mg -     hydrOXYzine  (ATARAX ) 10 MG tablet; Take one to two tablets as needed for itching.  Hypotension, unspecified hypotension type  Postablative hypothyroidism -     levothyroxine  (SYNTHROID ) 50 MCG tablet; Take 1 tablet (50 mcg total) by mouth daily before breakfast. TAKE 1 TABLET(50 MCG) BY MOUTH DAILY   -do not take any more midodrine  -Continue to monitor Blood pressure daily. If it starts to increase back up to hypertensive range, will slowly start back on BP meds -for now stay off lisinopril , hydrochlorothiazide , tizanidine .  -orthostatic BP's today were not consistent with orthostatic hypotension  -Continue to eat consistently throughout the day and drink water with electrolytes. -Educated patient to take her time when going from sitting to a standing position. -Administered SOLU-MEDROL  injection today for hives. -Take hydroxyzine  as needed for hives and itching associated, sedation warning given.  -AKI resolved on discharge -Return if symptoms get worse. - refilled synthroid  based on last labs Follow up in 5 weeks to go over BP log  Return in about 5 weeks  (around 01/07/2024).

## 2023-12-03 NOTE — Patient Instructions (Addendum)
 Continue off lisinopril , hydrochlorothiazide  and tizanidine .  Continue to keep BP log and stay hydrated, eat salt and get up slow.    Orthostatic Hypotension Blood pressure is a measurement of how strongly, or weakly, your circulating blood is pressing against the walls of your arteries. Orthostatic hypotension is a drop in blood pressure that can happen when you change positions, such as when you go from lying down to standing. Arteries are blood vessels that carry blood from your heart throughout your body. When blood pressure is too low, you may not get enough blood to your brain or to the rest of your organs. Orthostatic hypotension can cause light-headedness, sweating, rapid heartbeat, blurred vision, and fainting. These symptoms require further investigation into the cause. What are the causes? Orthostatic hypotension can be caused by many things, including: Sudden changes in posture, such as standing up quickly after you have been sitting or lying down. Loss of blood (anemia) or loss of body fluids (dehydration). Heart problems, neurologic problems, or hormone problems. Pregnancy. Aging. The risk for this condition increases as you get older. Severe infection (sepsis). Certain medicines, such as medicines for high blood pressure or medicines that make the body lose excess fluids (diuretics). What are the signs or symptoms? Symptoms of this condition may include: Weakness, light-headedness, or dizziness. Sweating. Blurred vision. Tiredness (fatigue). Rapid heartbeat. Fainting, in severe cases. How is this diagnosed? This condition is diagnosed based on: Your symptoms and medical history. Your blood pressure measurements. Your health care provider will check your blood pressure when you are: Lying down. Sitting. Standing. A blood pressure reading is recorded as two numbers, such as 120 over 80 (or 120/80). The first (top) number is called the systolic pressure. It is a measure  of the pressure in your arteries as your heart beats. The second (bottom) number is called the diastolic pressure. It is a measure of the pressure in your arteries when your heart relaxes between beats. Blood pressure is measured in a unit called mmHg. Healthy blood pressure for most adults is 120/80 mmHg. Orthostatic hypotension is defined as a 20 mmHg drop in systolic pressure or a 10 mmHg drop in diastolic pressure within 3 minutes of standing. Other information or tests that may be used to diagnose orthostatic hypotension include: Your other vital signs, such as your heart rate and temperature. Blood tests. An electrocardiogram (ECG) or echocardiogram. A Holter monitor. This is a device you wear that records your heart rhythm continuously, usually for 24-48 hours. Tilt table test. For this test, you will be safely secured to a table that moves you from a lying position to an upright position. Your heart rhythm and blood pressure will be monitored during the test. How is this treated? This condition may be treated by: Changing your diet. This may involve eating more salt (sodium) or drinking more water. Changing the dosage of certain medicines you are taking that might be lowering your blood pressure. Correcting the underlying reason for the orthostatic hypotension. Wearing compression stockings. Taking medicines to raise your blood pressure. Avoiding actions that trigger symptoms. Follow these instructions at home: Medicines Take over-the-counter and prescription medicines only as told by your health care provider. Follow instructions from your health care provider about changing the dosage of your current medicines, if this applies. Do not stop or adjust any of your medicines on your own. Eating and drinking  Drink enough fluid to keep your urine pale yellow. Eat extra salt only as directed. Do not add extra  salt to your diet unless advised by your health care provider. Eat frequent,  small meals. Avoid standing up suddenly after eating. General instructions  Get up slowly from lying down or sitting positions. This gives your blood pressure a chance to adjust. Avoid hot showers and excessive heat as directed by your health care provider. Engage in regular physical activity as directed by your health care provider. If you have compression stockings, wear them as told. Keep all follow-up visits. This is important. Contact a health care provider if: You have a fever for more than 2-3 days. You feel more thirsty than usual. You feel dizzy or weak. Get help right away if: You have chest pain. You have a fast or irregular heartbeat. You become sweaty or feel light-headed. You feel short of breath. You faint. You have any symptoms of a stroke. BE FAST is an easy way to remember the main warning signs of a stroke: B - Balance. Signs are dizziness, sudden trouble walking, or loss of balance. E - Eyes. Signs are trouble seeing or a sudden change in vision. F - Face. Signs are sudden weakness or numbness of the face, or the face or eyelid drooping on one side. A - Arms. Signs are weakness or numbness in an arm. This happens suddenly and usually on one side of the body. S - Speech. Signs are sudden trouble speaking, slurred speech, or trouble understanding what people say. T - Time. Time to call emergency services. Write down what time symptoms started. You have other signs of a stroke, such as: A sudden, severe headache with no known cause. Nausea or vomiting. Seizure. These symptoms may represent a serious problem that is an emergency. Do not wait to see if the symptoms will go away. Get medical help right away. Call your local emergency services (911 in the [place] Do not drive yourself to the hospital. Summary Orthostatic hypotension is a sudden drop in blood pressure. It can cause light-headedness, sweating, rapid heartbeat, blurred vision, and fainting. Orthostatic  hypotension can be diagnosed by having your blood pressure taken while lying down, sitting, and then standing. Treatment may involve changing your diet, wearing compression stockings, sitting up slowly, adjusting your medicines, or correcting the underlying reason for the orthostatic hypotension. Get help right away if you have chest pain, a fast or irregular heartbeat, or symptoms of a stroke. This information is not intended to replace advice given to you by your health care provider. Make sure you discuss any questions you have with your health care provider. Document Revised: 08/24/2020 Document Reviewed: 08/24/2020 Elsevier Patient Education  2024 [employer].

## 2023-12-04 ENCOUNTER — Other Ambulatory Visit: Payer: Self-pay

## 2023-12-04 ENCOUNTER — Encounter: Payer: Self-pay | Admitting: Physician Assistant

## 2023-12-04 ENCOUNTER — Telehealth: Payer: Self-pay

## 2023-12-04 DIAGNOSIS — E890 Postprocedural hypothyroidism: Secondary | ICD-10-CM

## 2023-12-04 DIAGNOSIS — L509 Urticaria, unspecified: Secondary | ICD-10-CM

## 2023-12-04 MED ORDER — LEVOTHYROXINE SODIUM 50 MCG PO TABS: 50.0000 ug | ORAL_TABLET | Freq: Every day | ORAL | 1 refills | Status: DC

## 2023-12-04 MED ORDER — HYDROXYZINE HCL 10 MG PO TABS: ORAL_TABLET | ORAL | 0 refills | Status: DC

## 2023-12-04 NOTE — Telephone Encounter (Signed)
 Patient requesting to change pharmacy for  levothyroxine  and hydroxyzine .  Moved prescriptions to correct pharmacy  Called [facility] [place] to cancel prescriptions that was sent in error.  [phone].  Held 5 minutes will call back at later time.

## 2023-12-04 NOTE — Telephone Encounter (Signed)
 Copied from CRM [id number]. Topic: Clinical - Prescription Issue >> Dec 04, 2023  1:45 PM Angelica [name] wrote: Reason for CRM: hydrOXYzine  (ATARAX ) 10 MG tablet  Patient Needs for PCP to call pharmacy on instructions and dosage issue. So they can refill correctly. Needs call back when updated [phone]  Location: Call pharmacy  [id number] - [place], [place] - [address] AT [facility] [address] [place] [address] Phone: [phone] Fax: [phone] Hours: Open 24 hours

## 2023-12-04 NOTE — Telephone Encounter (Signed)
 Spoke with patient. She has changed her mind and would like to  keep prescriptions at [facility] in [place].  Will call [facility] and cancel. Called [facility]  to address concern with hydroxyzine  prescription held for over 20 minutes.  Likely will need frequency in script ie..every 4 hours or daily.  Can this script be resent with these added directions  to [facility] [place]?

## 2023-12-04 NOTE — Telephone Encounter (Signed)
 Please see request below. These medications were sent to [facility] yesterday. Please advise.       Copied from CRM [id number]. Topic: Clinical - Prescription Issue >> Dec 04, 2023  8:13 AM Lenon Radar A wrote: Reason for CRM: Patient called in regarding medication issue. Patient would like for medication to be sent to [facility] on [address] in [place]. Please send levothyroxine  (SYNTHROID ) 50 MCG tablet, hydrOXYzine  (ATARAX ) 10 MG tablet here. Any questions please contact patient at [phone].

## 2023-12-04 NOTE — Addendum Note (Signed)
 Addended by: Dickie Found on: 12/04/2023 11:14 AM   Modules accepted: Orders

## 2023-12-05 MED ORDER — HYDROXYZINE HCL 10 MG PO TABS: ORAL_TABLET | ORAL | 0 refills | Status: AC

## 2023-12-05 NOTE — Telephone Encounter (Signed)
 Patient has decided to stay with [facility] for medication refills.

## 2023-12-11 ENCOUNTER — Encounter: Payer: Self-pay | Admitting: Physician Assistant

## 2023-12-12 ENCOUNTER — Other Ambulatory Visit: Payer: Self-pay

## 2023-12-12 ENCOUNTER — Emergency Department (HOSPITAL_BASED_OUTPATIENT_CLINIC_OR_DEPARTMENT_OTHER)

## 2023-12-12 ENCOUNTER — Inpatient Hospital Stay (HOSPITAL_BASED_OUTPATIENT_CLINIC_OR_DEPARTMENT_OTHER)
Admission: EM | Admit: 2023-12-12 | Discharge: 2023-12-15 | DRG: 392 | Disposition: A | Discharge status: Home / Self Care | Attending: Internal Medicine | Admitting: Internal Medicine

## 2023-12-12 ENCOUNTER — Encounter (HOSPITAL_BASED_OUTPATIENT_CLINIC_OR_DEPARTMENT_OTHER): Payer: Self-pay

## 2023-12-12 DIAGNOSIS — Z7951 Long term (current) use of inhaled steroids: Secondary | ICD-10-CM | POA: Diagnosis not present

## 2023-12-12 DIAGNOSIS — K5289 Other specified noninfective gastroenteritis and colitis: Secondary | ICD-10-CM | POA: Diagnosis not present

## 2023-12-12 DIAGNOSIS — Z79890 Hormone replacement therapy (postmenopausal): Secondary | ICD-10-CM

## 2023-12-12 DIAGNOSIS — E1122 Type 2 diabetes mellitus with diabetic chronic kidney disease: Secondary | ICD-10-CM | POA: Diagnosis present

## 2023-12-12 DIAGNOSIS — F419 Anxiety disorder, unspecified: Secondary | ICD-10-CM | POA: Diagnosis present

## 2023-12-12 DIAGNOSIS — N182 Chronic kidney disease, stage 2 (mild): Secondary | ICD-10-CM | POA: Diagnosis not present

## 2023-12-12 DIAGNOSIS — Z87891 Personal history of nicotine dependence: Secondary | ICD-10-CM

## 2023-12-12 DIAGNOSIS — E785 Hyperlipidemia, unspecified: Secondary | ICD-10-CM | POA: Diagnosis not present

## 2023-12-12 DIAGNOSIS — Z880 Allergy status to penicillin: Secondary | ICD-10-CM | POA: Diagnosis not present

## 2023-12-12 DIAGNOSIS — F32A Depression, unspecified: Secondary | ICD-10-CM | POA: Diagnosis present

## 2023-12-12 DIAGNOSIS — K5730 Diverticulosis of large intestine without perforation or abscess without bleeding: Secondary | ICD-10-CM | POA: Diagnosis not present

## 2023-12-12 DIAGNOSIS — K529 Noninfective gastroenteritis and colitis, unspecified: Secondary | ICD-10-CM | POA: Diagnosis not present

## 2023-12-12 DIAGNOSIS — E876 Hypokalemia: Secondary | ICD-10-CM | POA: Diagnosis not present

## 2023-12-12 DIAGNOSIS — K6289 Other specified diseases of anus and rectum: Secondary | ICD-10-CM | POA: Diagnosis not present

## 2023-12-12 DIAGNOSIS — E039 Hypothyroidism, unspecified: Secondary | ICD-10-CM | POA: Diagnosis not present

## 2023-12-12 DIAGNOSIS — K5900 Constipation, unspecified: Secondary | ICD-10-CM | POA: Diagnosis not present

## 2023-12-12 DIAGNOSIS — I129 Hypertensive chronic kidney disease with stage 1 through stage 4 chronic kidney disease, or unspecified chronic kidney disease: Secondary | ICD-10-CM | POA: Diagnosis not present

## 2023-12-12 DIAGNOSIS — J45909 Unspecified asthma, uncomplicated: Secondary | ICD-10-CM | POA: Diagnosis not present

## 2023-12-12 DIAGNOSIS — Z9102 Food additives allergy status: Secondary | ICD-10-CM

## 2023-12-12 LAB — CBC WITH DIFFERENTIAL/PLATELET
Abs Immature Granulocytes: 0.02 10*3/uL (ref 0.00–0.07)
Basophils Absolute: 0.1 10*3/uL (ref 0.0–0.1)
Basophils Relative: 1 %
Eosinophils Absolute: 0.4 10*3/uL (ref 0.0–0.5)
Eosinophils Relative: 5 %
HCT: 33.2 % — ABNORMAL LOW (ref 36.0–46.0)
Hemoglobin: 11.3 g/dL — ABNORMAL LOW (ref 12.0–15.0)
Immature Granulocytes: 0 %
Lymphocytes Relative: 32 %
Lymphs Abs: 2.8 10*3/uL (ref 0.7–4.0)
MCH: 29 pg (ref 26.0–34.0)
MCHC: 34 g/dL (ref 30.0–36.0)
MCV: 85.3 fL (ref 80.0–100.0)
Monocytes Absolute: 0.7 10*3/uL (ref 0.1–1.0)
Monocytes Relative: 7 %
Neutro Abs: 5 10*3/uL (ref 1.7–7.7)
Neutrophils Relative %: 55 %
Platelets: 236 10*3/uL (ref 150–400)
RBC: 3.89 MIL/uL (ref 3.87–5.11)
RDW: 14.8 % (ref 11.5–15.5)
WBC: 9 10*3/uL (ref 4.0–10.5)
nRBC: 0 % (ref 0.0–0.2)

## 2023-12-12 LAB — GLUCOSE, CAPILLARY
Glucose-Capillary: 91 mg/dL (ref 70–99)
Glucose-Capillary: 110 mg/dL — ABNORMAL HIGH (ref 70–99)
Glucose-Capillary: 87 mg/dL (ref 70–99)

## 2023-12-12 LAB — BASIC METABOLIC PANEL WITH GFR
Anion gap: 13 (ref 5–15)
BUN: 8 mg/dL (ref 8–23)
CO2: 26 mmol/L (ref 22–32)
Calcium: 9.4 mg/dL (ref 8.9–10.3)
Chloride: 103 mmol/L (ref 98–111)
Creatinine, Ser: 1.06 mg/dL — ABNORMAL HIGH (ref 0.44–1.00)
GFR, Estimated: 55 mL/min — ABNORMAL LOW (ref >60)
Glucose, Bld: 108 mg/dL — ABNORMAL HIGH (ref 70–99)
Potassium: 3.4 mmol/L — ABNORMAL LOW (ref 3.5–5.1)
Sodium: 142 mmol/L (ref 135–145)

## 2023-12-12 MED ORDER — INSULIN ASPART 100 UNIT/ML IJ SOLN: 0.0000 [IU] | Freq: Every day | INTRAMUSCULAR | Status: DC

## 2023-12-12 MED ORDER — ALBUTEROL SULFATE HFA 108 (90 BASE) MCG/ACT IN AERS: 2.0000 | INHALATION_SPRAY | Freq: Four times a day (QID) | RESPIRATORY_TRACT | Status: DC | PRN

## 2023-12-12 MED ORDER — ESCITALOPRAM OXALATE 20 MG PO TABS
20.0000 mg | ORAL_TABLET | Freq: Every day | ORAL | Status: DC
  Administered 2023-12-12 – 2023-12-15 (×4): 20 mg via ORAL
  Filled 2023-12-12 (×4): qty 1

## 2023-12-12 MED ORDER — SODIUM CHLORIDE 0.9 % IV SOLN
2.0000 g | INTRAVENOUS | Status: DC
  Administered 2023-12-12 – 2023-12-14 (×3): 2 g via INTRAVENOUS
  Filled 2023-12-12 (×3): qty 20

## 2023-12-12 MED ORDER — IOHEXOL 300 MG/ML  SOLN
100.0000 mL | Freq: Once | INTRAMUSCULAR | Status: AC | PRN
  Administered 2023-12-12: 100 mL via INTRAVENOUS

## 2023-12-12 MED ORDER — LEVOTHYROXINE SODIUM 50 MCG PO TABS
50.0000 ug | ORAL_TABLET | Freq: Every day | ORAL | Status: DC
  Administered 2023-12-12 – 2023-12-15 (×4): 50 ug via ORAL
  Filled 2023-12-12: qty 2
  Filled 2023-12-12 (×3): qty 1

## 2023-12-12 MED ORDER — ONDANSETRON HCL 4 MG/2ML IJ SOLN: 4.0000 mg | Freq: Four times a day (QID) | INTRAMUSCULAR | Status: DC | PRN

## 2023-12-12 MED ORDER — SENNA 8.6 MG PO TABS
1.0000 | ORAL_TABLET | Freq: Every day | ORAL | Status: DC
  Administered 2023-12-12 – 2023-12-13 (×2): 8.6 mg via ORAL
  Filled 2023-12-12 (×3): qty 1

## 2023-12-12 MED ORDER — POLYETHYLENE GLYCOL 3350 17 G PO PACK
17.0000 g | PACK | Freq: Two times a day (BID) | ORAL | Status: DC
  Administered 2023-12-12 – 2023-12-13 (×3): 17 g via ORAL
  Filled 2023-12-12 (×5): qty 1

## 2023-12-12 MED ORDER — ACETAMINOPHEN 325 MG PO TABS: 650.0000 mg | ORAL_TABLET | Freq: Four times a day (QID) | ORAL | Status: DC | PRN

## 2023-12-12 MED ORDER — HYDROCORTISONE (PERIANAL) 2.5 % EX CREA: 1.0000 | TOPICAL_CREAM | Freq: Two times a day (BID) | CUTANEOUS | 2 refills | Status: AC

## 2023-12-12 MED ORDER — ATORVASTATIN CALCIUM 40 MG PO TABS
80.0000 mg | ORAL_TABLET | Freq: Every day | ORAL | Status: DC
  Administered 2023-12-12 – 2023-12-14 (×3): 80 mg via ORAL
  Filled 2023-12-12 (×3): qty 2

## 2023-12-12 MED ORDER — SODIUM CHLORIDE 0.9 % IV SOLN: INTRAVENOUS | Status: AC

## 2023-12-12 MED ORDER — POLYETHYLENE GLYCOL 3350 17 G PO PACK: 17.0000 g | PACK | Freq: Two times a day (BID) | ORAL | Status: DC

## 2023-12-12 MED ORDER — ONDANSETRON HCL 4 MG PO TABS: 4.0000 mg | ORAL_TABLET | Freq: Four times a day (QID) | ORAL | Status: DC | PRN

## 2023-12-12 MED ORDER — DICYCLOMINE HCL 10 MG PO CAPS
10.0000 mg | ORAL_CAPSULE | Freq: Two times a day (BID) | ORAL | Status: DC
  Administered 2023-12-12 – 2023-12-15 (×7): 10 mg via ORAL
  Filled 2023-12-12 (×7): qty 1

## 2023-12-12 MED ORDER — METRONIDAZOLE 500 MG/100ML IV SOLN
500.0000 mg | Freq: Once | INTRAVENOUS | Status: AC
  Administered 2023-12-12: 500 mg via INTRAVENOUS
  Filled 2023-12-12: qty 100

## 2023-12-12 MED ORDER — BUSPIRONE HCL 10 MG PO TABS
30.0000 mg | ORAL_TABLET | Freq: Two times a day (BID) | ORAL | Status: DC
  Administered 2023-12-12 – 2023-12-15 (×7): 30 mg via ORAL
  Filled 2023-12-12 (×2): qty 3
  Filled 2023-12-12: qty 2
  Filled 2023-12-12 (×2): qty 6
  Filled 2023-12-12 (×2): qty 3
  Filled 2023-12-12: qty 2
  Filled 2023-12-12: qty 3

## 2023-12-12 MED ORDER — MORPHINE SULFATE (PF) 4 MG/ML IV SOLN
4.0000 mg | Freq: Once | INTRAVENOUS | Status: AC
  Administered 2023-12-12: 4 mg via INTRAVENOUS
  Filled 2023-12-12: qty 1

## 2023-12-12 MED ORDER — CIPROFLOXACIN IN D5W 400 MG/200ML IV SOLN: 400.0000 mg | Freq: Two times a day (BID) | INTRAVENOUS | Status: DC

## 2023-12-12 MED ORDER — LAMOTRIGINE 100 MG PO TABS
100.0000 mg | ORAL_TABLET | Freq: Two times a day (BID) | ORAL | Status: DC
  Administered 2023-12-12 – 2023-12-15 (×7): 100 mg via ORAL
  Filled 2023-12-12 (×7): qty 1

## 2023-12-12 MED ORDER — TRAMADOL HCL 50 MG PO TABS
50.0000 mg | ORAL_TABLET | Freq: Four times a day (QID) | ORAL | Status: DC | PRN
  Administered 2023-12-12 (×2): 50 mg via ORAL
  Filled 2023-12-12 (×2): qty 1

## 2023-12-12 MED ORDER — CIPROFLOXACIN IN D5W 400 MG/200ML IV SOLN
400.0000 mg | Freq: Once | INTRAVENOUS | Status: AC
  Administered 2023-12-12: 400 mg via INTRAVENOUS
  Filled 2023-12-12: qty 200

## 2023-12-12 MED ORDER — METRONIDAZOLE 500 MG/100ML IV SOLN
500.0000 mg | Freq: Two times a day (BID) | INTRAVENOUS | Status: DC
  Administered 2023-12-12 – 2023-12-15 (×6): 500 mg via INTRAVENOUS
  Filled 2023-12-12 (×6): qty 100

## 2023-12-12 MED ORDER — DAPAGLIFLOZIN PROPANEDIOL 10 MG PO TABS: 10.0000 mg | ORAL_TABLET | Freq: Every day | ORAL | Status: DC

## 2023-12-12 MED ORDER — ALBUTEROL SULFATE (2.5 MG/3ML) 0.083% IN NEBU: 2.5000 mg | INHALATION_SOLUTION | Freq: Four times a day (QID) | RESPIRATORY_TRACT | Status: DC | PRN

## 2023-12-12 MED ORDER — POLYETHYLENE GLYCOL 3350 17 G PO PACK: 17.0000 g | PACK | Freq: Every day | ORAL | Status: DC

## 2023-12-12 MED ORDER — DAPAGLIFLOZIN PROPANEDIOL 10 MG PO TABS
10.0000 mg | ORAL_TABLET | Freq: Every day | ORAL | Status: DC
  Administered 2023-12-13 – 2023-12-15 (×3): 10 mg via ORAL
  Filled 2023-12-12 (×3): qty 1

## 2023-12-12 MED ORDER — TRAZODONE HCL 50 MG PO TABS: 50.0000 mg | ORAL_TABLET | Freq: Every evening | ORAL | Status: DC | PRN

## 2023-12-12 MED ORDER — ENOXAPARIN SODIUM 40 MG/0.4ML IJ SOSY
40.0000 mg | PREFILLED_SYRINGE | INTRAMUSCULAR | Status: DC
  Administered 2023-12-12 – 2023-12-14 (×3): 40 mg via SUBCUTANEOUS
  Filled 2023-12-12 (×3): qty 0.4

## 2023-12-12 MED ORDER — CLONAZEPAM 0.5 MG PO TABS
0.5000 mg | ORAL_TABLET | Freq: Every day | ORAL | Status: DC | PRN
  Filled 2023-12-12: qty 1

## 2023-12-12 MED ORDER — INSULIN ASPART 100 UNIT/ML IJ SOLN
0.0000 [IU] | Freq: Three times a day (TID) | INTRAMUSCULAR | Status: DC
  Administered 2023-12-14: 2 [IU] via SUBCUTANEOUS

## 2023-12-12 NOTE — ED Notes (Signed)
 At bedside with EDP Pollina for pts rectal exam.

## 2023-12-12 NOTE — ED Triage Notes (Signed)
 Pt coming in with severe pain in tailbone for past 2 days. Gradual onset. No injury. No loss of control of bowel or bladder. More painful when sitting on affected area. Pt denies any other symptoms.

## 2023-12-12 NOTE — ED Provider Notes (Signed)
 [place] [facility]GENC[facility] AT [facility] [place] Provider Note   CSN: [id number] Arrival date & time: 12/12/23  9518     Patient presents with: Tailbone Pain   Angelica [name] is a 73 y.o. female.   Patient presents to the emergency department for evaluation of pain near her tailbone.  Symptoms present for 2 days.  Patient denies injury.  She reports pain worsens with trying to sit.       Prior to Admission medications   Medication Sig Start Date End Date Taking? Authorizing Provider  albuterol  (VENTOLIN  HFA) 108 (90 Base) MCG/ACT inhaler Inhale 2 puffs into the lungs every 6 (six) hours as needed for wheezing. 04/16/23   Breeback, [name] L, PA-C  atorvastatin  (LIPITOR) 80 MG tablet Take 1 tablet (80 mg total) by mouth daily. Patient taking differently: Take 80 mg by mouth at bedtime. 09/16/23   Breeback, [name] L, PA-C  budesonide -formoterol  (SYMBICORT ) 160-4.5 MCG/ACT inhaler Inhale 2 puffs into the lungs 2 (two) times daily. Patient taking differently: Inhale 2 puffs into the lungs 2 (two) times daily as needed (Asthma). 02/14/23   Breeback, [name] L, PA-C  busPIRone  (BUSPAR ) 30 MG tablet Take 1 tablet (30 mg total) by mouth in the morning and at bedtime. 09/15/23   Breeback, [name] L, PA-C  clonazePAM  (KLONOPIN ) 0.5 MG tablet TAKE 1 TABLET BY MOUTH AS NEEDED FOR ANXIETY . DO NOT EXCEED 1 PER 24 HOURS 06/16/23   Breeback, [name] L, PA-C  dapagliflozin  propanediol (FARXIGA ) 10 MG TABS tablet Take 1 tablet (10 mg total) by mouth daily. 09/16/23   Breeback, [name] L, PA-C  dicyclomine  (BENTYL ) 10 MG capsule Take 1 capsule (10 mg total) by mouth 2 (two) times daily. 02/14/23   Breeback, [name] L, PA-C  diphenhydrAMINE (SOMINEX) 25 MG tablet Take 25 mg by mouth at bedtime as needed for sleep or allergies.    [provider]  escitalopram  (LEXAPRO ) 20 MG tablet Take 1 tablet by mouth once daily 09/15/23   Breeback, [name] L, PA-C  hydrOXYzine  (ATARAX ) 10 MG tablet Take one to two tablets as  needed for itching up to three times a day. 12/05/23   Breeback, [name] L, PA-C  ipratropium-albuterol  (DUONEB) 0.5-2.5 (3) MG/3ML SOLN Take 3 mLs by nebulization every 4 (four) hours as needed (wheeze, SOB). 12/03/22   Breeback, [name] L, PA-C  lamoTRIgine  (LAMICTAL ) 100 MG tablet Take 1 tablet (100 mg total) by mouth 2 (two) times daily. 09/15/23   Breeback, [name] L, PA-C  levothyroxine  (SYNTHROID ) 50 MCG tablet Take 1 tablet (50 mcg total) by mouth daily before breakfast. TAKE 1 TABLET(50 MCG) BY MOUTH DAILY 12/04/23   Breeback, [name] L, PA-C  traZODone  (DESYREL ) 50 MG tablet TAKE 1/2 TO 1 TABLET(25 TO 50 MG) BY MOUTH AT BEDTIME AS NEEDED FOR SLEEP 11/18/23   Breeback, [name] L, PA-C    Allergies: Penicillins, Red dye #40 (allura red), Sulfa antibiotics, Midodrine , Amitriptyline , Macrodantin, and Nitrofurantoin    Review of Systems  Updated Vital Signs BP 127/84 (BP Location: Right Arm)   Pulse 70   Temp 97.8 F (36.6 C) (Oral)   Resp 16   Ht 5' 3 (1.6 m)   Wt 59 kg   LMP 06/24/2000   SpO2 100%   BMI 23.03 kg/m   Physical Exam Vitals and nursing note reviewed. Exam conducted with a chaperone present.  Constitutional:      General: She is not in acute distress.    Appearance: She is well-developed.  HENT:  Head: Normocephalic and atraumatic.     Mouth/Throat:     Mouth: Mucous membranes are moist.   Eyes:     General: Vision grossly intact. Gaze aligned appropriately.     Extraocular Movements: Extraocular movements intact.     Conjunctiva/sclera: Conjunctivae normal.    Cardiovascular:     Rate and Rhythm: Normal rate and regular rhythm.     Pulses: Normal pulses.     Heart sounds: Normal heart sounds, S1 normal and S2 normal. No murmur heard.    No friction rub. No gallop.  Pulmonary:     Effort: Pulmonary effort is normal. No respiratory distress.     Breath sounds: Normal breath sounds.  Abdominal:     General: Bowel sounds are normal.     Palpations: Abdomen is soft.      Tenderness: There is no abdominal tenderness. There is no guarding or rebound.     Hernia: No hernia is present.  Genitourinary:     Comments: No tenderness with palpation of coccyx Large amount of soft, claylike stool in rectal vault  Musculoskeletal:        General: No swelling.     Cervical back: Full passive range of motion without pain, normal range of motion and neck supple. No spinous process tenderness or muscular tenderness. Normal range of motion.     Right lower leg: No edema.     Left lower leg: No edema.   Skin:    General: Skin is warm and dry.     Capillary Refill: Capillary refill takes less than 2 seconds.     Findings: No ecchymosis, erythema, rash or wound.   Neurological:     General: No focal deficit present.     Mental Status: She is alert and oriented to person, place, and time.     GCS: GCS eye subscore is 4. GCS verbal subscore is 5. GCS motor subscore is 6.     Cranial Nerves: Cranial nerves 2-12 are intact.     Sensory: Sensation is intact.     Motor: Motor function is intact.     Coordination: Coordination is intact.   Psychiatric:        Attention and Perception: Attention normal.        Mood and Affect: Mood normal.        Speech: Speech normal.        Behavior: Behavior normal.     (all labs ordered are listed, but only abnormal results are displayed) Labs Reviewed  CBC WITH DIFFERENTIAL/PLATELET - Abnormal; Notable for the following components:      Result Value   Hemoglobin 11.3 (*)    HCT 33.2 (*)    All other components within normal limits  BASIC METABOLIC PANEL WITH GFR - Abnormal; Notable for the following components:   Potassium 3.4 (*)    Glucose, Bld 108 (*)    Creatinine, Ser 1.06 (*)    GFR, Estimated 55 (*)    All other components within normal limits    EKG: None  Radiology: CT PELVIS W CONTRAST Result Date: 12/12/2023 CLINICAL DATA:  Evaluate for perianal fistula, perianal abscess, or proctitis. Complains of  pain when sitting. History of breast cancer. EXAM: CT PELVIS WITH CONTRAST TECHNIQUE: Multidetector CT imaging of the pelvis was performed using the standard protocol following the bolus administration of intravenous contrast. RADIATION DOSE REDUCTION: This exam was performed according to the departmental dose-optimization program which includes automated exposure control, adjustment of the mA and/or  kV according to patient size and/or use of iterative reconstruction technique. CONTRAST:  OMNIPAQUE  IOHEXOL  300 MG/ML  SOLN COMPARISON:  07/12/2023. FINDINGS: Urinary Tract:  No abnormality visualized. Bowel: The visualized portions of the lower abdominal bowel loops are nondilated without wall thickening or inflammation. Sigmoid diverticulosis without signs of acute diverticulitis. Moderate volume of desiccated stool is identified within the rectum there is mild wall thickening involving the proximal rectum. Perirectal and presacral soft tissue stranding noted. No significant free fluid or fluid collections identified within the pelvis. Increased skin thickening about the anal verge and perineum. No focal fluid collections or signs of patent fistula. Mildly increased soft tissue along bilateral gluteal folds and labia. Vascular/Lymphatic: Aortic atherosclerosis. No abdominopelvic adenopathy. Reproductive:  No mass or other significant abnormality Other: No free fluid or focal fluid collections identified. Specifically no signs of drainable abscess. Musculoskeletal: No acute or suspicious osseous findings. L4-5 degenerative disc disease. Bilateral facet arthropathy within the lower lumbar spine. IMPRESSION: 1. Moderate volume of desiccated stool is identified within the rectum. There is mild wall thickening involving the proximal rectum. Perirectal and presacral soft tissue stranding noted. Findings are compatible with stercoral colitis. 2. Increased soft tissue thickening about the anal verge and perineum. No  focal fluid collections or signs of patent fistula. Additionally, there is mildly increased skin thickening along bilateral gluteal folds and labia. Correlate for any clinical signs or symptoms of cellulitis. 3. Sigmoid diverticulosis without signs of acute diverticulitis. 4.  Aortic Atherosclerosis (ICD10-I70.0). Electronically Signed   By: Kimberley Penman M.D.   On: 12/12/2023 05:56     Procedures   Medications Ordered in the ED  ciprofloxacin  (CIPRO ) IVPB 400 mg (has no administration in time range)  metroNIDAZOLE  (FLAGYL ) IVPB 500 mg (500 mg Intravenous New Bag/Given 12/12/23 0865)  iohexol  (OMNIPAQUE ) 300 MG/ML solution 100 mL (100 mLs Intravenous Contrast Given 12/12/23 0529)                                    Medical Decision Making Amount and/or Complexity of Data Reviewed Labs: ordered. Radiology: ordered.  Risk Prescription drug management. Decision regarding hospitalization.   Patient presents stating that she has having tailbone pain.  There is no known injury.  Visual inspection did not reveal any evidence of pilonidal abscess or cellulitis.  She did have liquid stool present in the perianal area and diffuse tenderness in this region.  Rectal exam revealed significant tenderness and a large amount of claylike stool.  She did not tolerate, however, disimpaction.  Lab work does not show any significant abnormality.  CT scan, however, shows findings of stercoral colitis.  Will cover with antibiotics empirically, will require admission for further management.     Final diagnoses:  Stercoral colitis    ED Discharge Orders     None          Ballard Bongo, MD 12/12/23 [phone]

## 2023-12-12 NOTE — H&P (Signed)
 History and Physical  GRACIN SOOHOO ZOX:[id number] DOB: 06-19-1951 DOA: 12/12/2023  PCP: [name], PA-C   Chief Complaint: Rectal pain  HPI: Angelica [name] is a 73 y.o. female with medical history significant for well-controlled type 2 diabetes, hypothyroidism, anxiety/depression recent hospitalization for hypotension being admitted to the hospital with rectal pain found to have constipation and stercoral colitis.  She denies any prior history of constipation, has what she feels are normal regular bowel movements almost daily.  Starting about 4 to 5 days ago, she was having some rectal pain, thought it was hemorrhoids but over-the-counter hemorrhoid treatments did not seem to help.  She has some mild lower pelvic discomfort on palpation.  Denies any nausea, bloating, fevers or chills.  Review of Systems: Please see HPI for pertinent positives and negatives. A complete 10 system review of systems are otherwise negative.  Past Medical History:  Diagnosis Date   Allergy    Anxiety    Asthma    Breast cancer (HCC) 2009   Depression    Dyspnea    with heavy pollen    Hypertension    Hypothyroidism    Postmenopausal    Thyroid  disease    Past Surgical History:  Procedure Laterality Date   ADENOIDECTOMY     APPENDECTOMY     BREAST RECONSTRUCTION  06/2008   CHOLECYSTECTOMY N/A 11/12/2017   Procedure: LAPAROSCOPIC CHOLECYSTECTOMY WITH INTRAOPERATIVE CHOLANGIOGRAM ERAS PATHWAY;  Surgeon: Sim Dryer, MD;  Location: [facility] OR;  Service: General;  Laterality: N/A;   MASTECTOMY  2009   [facility]   TONSILLECTOMY     Social History:  reports that she quit smoking about 12 years ago. Her smoking use included cigarettes. She started smoking about 57 years ago. She has a 67.5 pack-year smoking history. She has never used smokeless tobacco. She reports that she does not drink alcohol and does not use drugs.  Allergies  Allergen Reactions   Penicillins Hives, Swelling, Rash and Other (See  Comments)        Red Dye #40 (Allura Red) Hives   Sulfa Antibiotics Nausea And Vomiting and Hives   Midodrine      hives   Amitriptyline  Other (See Comments)    Crazy dreams     Macrodantin Nausea And Vomiting   Nitrofurantoin Nausea And Vomiting    Family History  Problem Relation Age of Onset   Colon cancer Mother    Stroke Mother    Cancer Mother        colon?   Hypertension Father    Hyperlipidemia Father    Colon polyps Neg Hx    Esophageal cancer Neg Hx    Rectal cancer Neg Hx    Stomach cancer Neg Hx      Prior to Admission medications   Medication Sig Start Date End Date Taking? Authorizing Provider  albuterol  (VENTOLIN  HFA) 108 (90 Base) MCG/ACT inhaler Inhale 2 puffs into the lungs every 6 (six) hours as needed for wheezing. 04/16/23   Breeback, [name] L, PA-C  atorvastatin  (LIPITOR) 80 MG tablet Take 1 tablet (80 mg total) by mouth daily. Patient taking differently: Take 80 mg by mouth at bedtime. 09/16/23   Breeback, [name] L, PA-C  budesonide -formoterol  (SYMBICORT ) 160-4.5 MCG/ACT inhaler Inhale 2 puffs into the lungs 2 (two) times daily. Patient taking differently: Inhale 2 puffs into the lungs 2 (two) times daily as needed (Asthma). 02/14/23   Breeback, [name] L, PA-C  busPIRone  (BUSPAR ) 30 MG tablet Take 1 tablet (30 mg  total) by mouth in the morning and at bedtime. 09/15/23   Breeback, [name] L, PA-C  clonazePAM  (KLONOPIN ) 0.5 MG tablet TAKE 1 TABLET BY MOUTH AS NEEDED FOR ANXIETY . DO NOT EXCEED 1 PER 24 HOURS 06/16/23   Breeback, [name] L, PA-C  dapagliflozin  propanediol (FARXIGA ) 10 MG TABS tablet Take 1 tablet (10 mg total) by mouth daily. 09/16/23   Breeback, [name] L, PA-C  dicyclomine  (BENTYL ) 10 MG capsule Take 1 capsule (10 mg total) by mouth 2 (two) times daily. 02/14/23   Breeback, [name] L, PA-C  diphenhydrAMINE (SOMINEX) 25 MG tablet Take 25 mg by mouth at bedtime as needed for sleep or allergies.    [provider]  escitalopram  (LEXAPRO ) 20 MG tablet Take  1 tablet by mouth once daily 09/15/23   Breeback, [name] L, PA-C  hydrocortisone  (ANUSOL -HC) 2.5 % rectal cream Place 1 Application rectally 2 (two) times daily. 12/12/23   Breeback, [name] L, PA-C  hydrOXYzine  (ATARAX ) 10 MG tablet Take one to two tablets as needed for itching up to three times a day. 12/05/23   Breeback, [name] L, PA-C  ipratropium-albuterol  (DUONEB) 0.5-2.5 (3) MG/3ML SOLN Take 3 mLs by nebulization every 4 (four) hours as needed (wheeze, SOB). 12/03/22   Breeback, [name] L, PA-C  lamoTRIgine  (LAMICTAL ) 100 MG tablet Take 1 tablet (100 mg total) by mouth 2 (two) times daily. 09/15/23   Breeback, [name] L, PA-C  levothyroxine  (SYNTHROID ) 50 MCG tablet Take 1 tablet (50 mcg total) by mouth daily before breakfast. TAKE 1 TABLET(50 MCG) BY MOUTH DAILY 12/04/23   Breeback, [name] L, PA-C  traZODone  (DESYREL ) 50 MG tablet TAKE 1/2 TO 1 TABLET(25 TO 50 MG) BY MOUTH AT BEDTIME AS NEEDED FOR SLEEP 11/18/23   Breeback, [name] L, PA-C    Physical Exam: BP (!) 123/56 (BP Location: Left Arm)   Pulse (!) 59   Temp 98.7 F (37.1 C)   Resp 20   Ht 5' 3 (1.6 m)   Wt 59 kg   LMP 06/24/2000   SpO2 100%   BMI 23.03 kg/m  General:  Alert, oriented, calm, in no acute distress, looks very comfortable Cardiovascular: RRR, no murmurs or rubs, no peripheral edema  Respiratory: clear to auscultation bilaterally, no wheezes, no crackles  Abdomen: soft, minimally tender in the lower pelvis, nondistended, normal bowel tones heard  Skin: dry, no rashes  Musculoskeletal: no joint effusions, normal range of motion  Psychiatric: appropriate affect, normal speech  Neurologic: extraocular muscles intact, clear speech, moving all extremities with intact sensorium         Labs on Admission:  Basic Metabolic Panel: Recent Labs  Lab 12/12/23 0437  NA 142  K 3.4*  CL 103  CO2 26  GLUCOSE 108*  BUN 8  CREATININE 1.06*  CALCIUM  9.4   Liver Function Tests: No results for input(s): AST, ALT, ALKPHOS,  BILITOT, PROT, ALBUMIN in the last 168 hours. No results for input(s): LIPASE, AMYLASE in the last 168 hours. No results for input(s): AMMONIA in the last 168 hours. CBC: Recent Labs  Lab 12/12/23 0437  WBC 9.0  NEUTROABS 5.0  HGB 11.3*  HCT 33.2*  MCV 85.3  PLT 236   Cardiac Enzymes: No results for input(s): CKTOTAL, CKMB, CKMBINDEX, TROPONINI in the last 168 hours. BNP (last 3 results) No results for input(s): BNP in the last 8760 hours.  ProBNP (last 3 results) No results for input(s): PROBNP in the last 8760 hours.  CBG: No results for input(s): GLUCAP  in the last 168 hours.  Radiological Exams on Admission: CT PELVIS W CONTRAST Result Date: 12/12/2023 CLINICAL DATA:  Evaluate for perianal fistula, perianal abscess, or proctitis. Complains of pain when sitting. History of breast cancer. EXAM: CT PELVIS WITH CONTRAST TECHNIQUE: Multidetector CT imaging of the pelvis was performed using the standard protocol following the bolus administration of intravenous contrast. RADIATION DOSE REDUCTION: This exam was performed according to the departmental dose-optimization program which includes automated exposure control, adjustment of the mA and/or kV according to patient size and/or use of iterative reconstruction technique. CONTRAST:  OMNIPAQUE  IOHEXOL  300 MG/ML  SOLN COMPARISON:  07/12/2023. FINDINGS: Urinary Tract:  No abnormality visualized. Bowel: The visualized portions of the lower abdominal bowel loops are nondilated without wall thickening or inflammation. Sigmoid diverticulosis without signs of acute diverticulitis. Moderate volume of desiccated stool is identified within the rectum there is mild wall thickening involving the proximal rectum. Perirectal and presacral soft tissue stranding noted. No significant free fluid or fluid collections identified within the pelvis. Increased skin thickening about the anal verge and perineum. No focal fluid  collections or signs of patent fistula. Mildly increased soft tissue along bilateral gluteal folds and labia. Vascular/Lymphatic: Aortic atherosclerosis. No abdominopelvic adenopathy. Reproductive:  No mass or other significant abnormality Other: No free fluid or focal fluid collections identified. Specifically no signs of drainable abscess. Musculoskeletal: No acute or suspicious osseous findings. L4-5 degenerative disc disease. Bilateral facet arthropathy within the lower lumbar spine. IMPRESSION: 1. Moderate volume of desiccated stool is identified within the rectum. There is mild wall thickening involving the proximal rectum. Perirectal and presacral soft tissue stranding noted. Findings are compatible with stercoral colitis. 2. Increased soft tissue thickening about the anal verge and perineum. No focal fluid collections or signs of patent fistula. Additionally, there is mildly increased skin thickening along bilateral gluteal folds and labia. Correlate for any clinical signs or symptoms of cellulitis. 3. Sigmoid diverticulosis without signs of acute diverticulitis. 4.  Aortic Atherosclerosis (ICD10-I70.0). Electronically Signed   By: Kimberley Penman M.D.   On: 12/12/2023 05:56   Assessment/Plan Angelica [name] is a 73 y.o. female with medical history significant for well-controlled type 2 diabetes, hypothyroidism, anxiety/depression recent hospitalization for hypotension being admitted to the hospital with rectal pain found to have constipation and stercoral colitis.  Constipation with stercoral colitis-the source of her rectal pain, no evidence of perforation or other complication. -Inpatient admission -Carb modified diet -Stool regimen -Hydration -Empiric antibiotics for colitis  Type 2 diabetes-well-controlled, carb modified diet, continue Farxiga  and moderate dose sliding scale  Hypothyroidism-Synthroid   Hypotension-previously hospitalized for this, was discharged home on midodrine  which she  has discontinued due to red dye allergy.  Blood pressure now stable.  Hyperlipidemia-Lipitor  Depression/anxiety-continue Lexapro , Lamictal , BuSpar   DVT prophylaxis: Lovenox      Code Status: Full Code  Consults called: None  Admission status: The appropriate patient status for this patient is INPATIENT. Inpatient status is judged to be reasonable and necessary in order to provide the required intensity of service to ensure the patient's safety. The patient's presenting symptoms, physical exam findings, and initial radiographic and laboratory data in the context of their chronic comorbidities is felt to place them at high risk for further clinical deterioration. Furthermore, it is not anticipated that the patient will be medically stable for discharge from the hospital within 2 midnights of admission.    I certify that at the point of admission it is my clinical judgment that the patient will require  inpatient hospital care spanning beyond 2 midnights from the point of admission due to high intensity of service, high risk for further deterioration and high frequency of surveillance required  Time spent: 48 minutes  Margarito Dehaas Rickey Charm MD [facility] Pager [phone]  If 7PM-7AM, please contact night-coverage [email or web address] Password [email or web address]  12/12/2023, 10:35 AM

## 2023-12-12 NOTE — ED Notes (Signed)
 Assisted provider Dr. Carol Chroman with manual disimpaction. Pt was unable to tolerate procedure. Procedure stopped. Pt readjusted in bed. Assisted with clean up. Call bell within reach. Family returned to bedside

## 2023-12-12 NOTE — ED Notes (Signed)
 Called [facility] for Transport @7 :59am.  Spoke with Trenton Frock

## 2023-12-12 NOTE — ED Notes (Signed)
 AM meds not given. [facility] at bedside to transport patient. IP RN made aware that pt will need meds upon arrival.

## 2023-12-12 NOTE — Plan of Care (Addendum)
 Transfer from [facility] ED to [facility] Long MedSurg unit:  73 year old female history of hypertension, hyperlipidemia, DM type II, mood disorder, CKD, mood disorder, tardive dyskinesia and hypothyroidism presented to emergency department complaining of pain at the tailbone or pain around rectal area.    Patient reported symptom has been for 2 days.  Denies any injury and fall.  Reported pain worse when trying to sit. At presentation to ED patient is hemodynamically stable. BMP showing low potassium 3.4.  Creatinine 1.06 and GFR 55.  Renal function at baseline. CBC unremarkable.  CT abdomen pelvis showing stercoral colitis.Increased soft tissue thickening about the anal verge and perineum. No focal fluid collections or signs of patent fistula. Additionally, there is mildly increased skin thickening along bilateral gluteal folds and labia. Correlate for any clinical signs or symptoms of cellulitis.  Per Dr. Carie Charity physical exam no evidence of abscess and cellulitis.  However she has liquid stool in the perianal area with diffuse tenderness in the region.  Rectal exam showed significant tenderness and large amount of claylike stool in the rectal vault.  ED physician reported patient did not tolerate rectal disimpaction due to significant rectal pain.  In the ED patient has been treated with ciprofloxacin  and metronidazole .  Patient has penicillin allergy.  Hospitalist has been consulted for further evaluation management of rectal pain in the context of constipation need aggressive bowel regimen and treatment for  stercoral colitis.

## 2023-12-12 NOTE — TOC Initial Note (Signed)
 Transition of Care [facility]) - Initial/Assessment Note    Patient Details  Name: Angelica [name] MRN: [id number] Date of Birth: 30-Sep-1950  Transition of Care [facility]) CM/SW Contact:    Angelica Ream, RN Phone Number: 12/12/2023, 2:28 PM  Clinical Narrative:                 Angelica [name] w/ pt in room; pt says she lives at home w/ her family; she plans to return at d/c; pt identified POC Angelica [name] [phone]); family will provide transportation; pt verified insurance/PCP; he denied SDOH risks; pt says she does not have DME, HH services, or home oxygen; TOC is following.  Expected Discharge Plan: Home/Self Care Barriers to Discharge: Continued Medical Work up   Patient Goals and CMS Choice Patient states their goals for this hospitalization and ongoing recovery are:: home CMS Medicare.gov Compare Post Acute Care list provided to:: Patient   [place] ownership interest in [facility].provided to:: Patient    Expected Discharge Plan and Services   Discharge Planning Services: CM Consult   Living arrangements for the past 2 months: Single Family Home                                      Prior Living Arrangements/Services Living arrangements for the past 2 months: Single Family Home Lives with:: Relatives Patient language and need for interpreter reviewed:: Yes Do you feel safe going back to the place where you live?: Yes      Need for Family Participation in Patient Care: Yes (Comment) Care giver support system in place?: Yes (comment) Current home services:  (n/a) Criminal Activity/Legal Involvement Pertinent to Current Situation/Hospitalization: No - Comment as needed  Activities of Daily Living   ADL Screening (condition at time of admission) Independently performs ADLs?: Yes (appropriate for developmental age) Is the patient deaf or have difficulty hearing?: No Does the patient have difficulty seeing, even when wearing glasses/contacts?: No Does the  patient have difficulty concentrating, remembering, or making decisions?: No  Permission Sought/Granted Permission sought to share information with : Case Manager Permission granted to share information with : Yes, Verbal Permission Granted  Share Information with NAME: Case Manager     Permission granted to share info w Relationship: Angelica [name] (spouse) [phone]     Emotional Assessment Appearance:: Appears stated age Attitude/Demeanor/Rapport: Gracious Affect (typically observed): Accepting Orientation: : Oriented to Self, Oriented to Place, Oriented to  Time, Oriented to Situation Alcohol / Substance Use: Not Applicable Psych Involvement: No (comment)  Admission diagnosis:  Anal or rectal pain [K62.89] Stercoral colitis [K52.89] Patient Active Problem List   Diagnosis Date Noted   Anal or rectal pain 12/12/2023   Hives 12/03/2023   AKI (acute kidney injury) (HCC) 11/30/2023   Syncope, vasovagal 11/30/2023   Mitral valve disease 11/30/2023   Malnutrition of moderate degree 11/28/2023   Hypotension 11/26/2023   CKD (chronic kidney disease) stage 3, GFR 30-59 ml/min (HCC) 09/16/2023   Decreased GFR 06/16/2023   Tardive dyskinesia 05/12/2023   Facial twitching 05/12/2023   Medication side effect 05/12/2023   Stress at home 10/15/2022   Snoring 05/15/2022   Non-restorative sleep 05/15/2022   No energy 05/15/2022   Post-menopausal 05/15/2022   Fall 03/08/2022   Primary osteoarthritis of left knee 12/19/2021   Hirsutism 10/19/2021   Night sweats 10/19/2021   Personal history of breast cancer 10/15/2021   Diverticular disease  of colon 10/15/2021   Osteopenia 05/10/2021   Injection site reaction 04/04/2020   Rotator cuff syndrome, left 05/26/2019   Neck fullness 08/31/2018   Colon polyps 03/31/2018   Left-sided nosebleed 03/02/2018   TMJ pain dysfunction syndrome 03/02/2018   Calculus of gallbladder 10/21/2017   Elevated lipase 10/21/2017   Diarrhea 10/21/2017    Upper abdominal pain 10/21/2017   Cellulitis and abscess of buttock 06/04/2017   Essential hypertension 06/11/2016   Epistaxis 11/14/2015   Trapezius muscle spasm 09/28/2015   Elevated blood pressure 04/26/2015   Dyslipidemia (high LDL; low HDL) 03/09/2015   IBS (irritable bowel syndrome) 08/22/2014   Insomnia 03/15/2014   Chondromalacia of right patellofemoral joint 02/10/2014   Anxiety and depression 08/03/2013   Episodic mood disorder (HCC) 10/17/2012   Breast cancer (HCC) 01/31/2011   GAD (generalized anxiety disorder) 01/31/2011   Asthma 01/31/2011   Seasonal allergies 01/31/2011   Hypothyroid 01/31/2011   PCP:  [name], PA-C Pharmacy:   [facility] [phone] - [place], [place] - [address] AT [facility] [address] [place]  [address] Phone: [phone] Fax: [phone]  [facility] - [place], [place] - [address] [place] [address] Phone: [phone] Fax: [phone]  [facility] - [place], [place] - [address] [place] [address] Phone: [phone] Fax: [phone]     Social Drivers of Health (SDOH) Social History: SDOH Screenings   Food Insecurity: No Food Insecurity (12/12/2023)  Recent Concern: Food Insecurity - Food Insecurity Present (11/26/2023)  Housing: Low Risk  (12/12/2023)  Transportation Needs: No Transportation Needs (12/12/2023)  Utilities: Not At Risk (12/12/2023)  Alcohol Screen: Low Risk  (04/21/2023)  Depression (PHQ2-9): Medium Risk (09/15/2023)  Financial Resource Strain: Low Risk  (06/16/2023)  Physical Activity: Insufficiently Active (06/16/2023)  Social Connections: Socially Integrated (12/12/2023)  Stress: No Stress Concern Present (06/16/2023)  Tobacco Use: Medium Risk (12/12/2023)  Health Literacy: Adequate Health Literacy (04/21/2023)   SDOH Interventions: Food Insecurity  Interventions: Intervention Not Indicated, Inpatient TOC Housing Interventions: Intervention Not Indicated, Inpatient TOC Transportation Interventions: Intervention Not Indicated, Inpatient TOC Utilities Interventions: Intervention Not Indicated, Inpatient TOC   Readmission Risk Interventions    12/12/2023    2:26 PM  Readmission Risk Prevention Plan  Transportation Screening Complete  PCP or Specialist Appt within 5-7 Days Complete  Home Care Screening Complete  Medication Review (RN CM) Complete

## 2023-12-12 NOTE — Hospital Course (Signed)
 73 year old female history of hypertension, hyperlipidemia, DM type II, mood disorder, CKD, mood disorder, tardive dyskinesia and hypothyroidism presented to emergency department complaining of pain at the tailbone.  Patient reported symptom has been for 2 days.  Denies any injury and fall.  Reported pain worse when trying to sit. At presentation to ED patient is hemodynamically stable. BMP showing low potassium 3.4.  Creatinine 1.06 and GFR 55.  Renal function at baseline. CBC unremarkable.  CT abdomen pelvis showing stercoral colitis.Increased soft tissue thickening about the anal verge and perineum. No focal fluid collections or signs of patent fistula. Additionally, there is mildly increased skin thickening along bilateral gluteal folds and labia. Correlate for any clinical signs or symptoms of cellulitis.  Per Dr. Carie Charity physical exam no evidence of abscess and cellulitis.  However she has liquid stool in the perianal area with diffuse tenderness in the region.  Rectal exam showed significant tenderness and large amount of claylike stool in the rectal vault.  In the ED patient has been treated with ciprofloxacin  and metronidazole .

## 2023-12-12 NOTE — ED Notes (Signed)
 Patient transported to CT

## 2023-12-12 NOTE — ED Notes (Signed)
 ED Provider at bedside.

## 2023-12-13 DIAGNOSIS — K6289 Other specified diseases of anus and rectum: Secondary | ICD-10-CM | POA: Diagnosis not present

## 2023-12-13 LAB — BASIC METABOLIC PANEL WITH GFR
Anion gap: 11 (ref 5–15)
BUN: 8 mg/dL (ref 8–23)
CO2: 26 mmol/L (ref 22–32)
Calcium: 8.7 mg/dL — ABNORMAL LOW (ref 8.9–10.3)
Chloride: 101 mmol/L (ref 98–111)
Creatinine, Ser: 0.94 mg/dL (ref 0.44–1.00)
GFR, Estimated: >60 mL/min (ref >60)
Glucose, Bld: 97 mg/dL (ref 70–99)
Potassium: 3.5 mmol/L (ref 3.5–5.1)
Sodium: 138 mmol/L (ref 135–145)

## 2023-12-13 LAB — CBC
HCT: 33.5 % — ABNORMAL LOW (ref 36.0–46.0)
Hemoglobin: 11.1 g/dL — ABNORMAL LOW (ref 12.0–15.0)
MCH: 29.4 pg (ref 26.0–34.0)
MCHC: 33.1 g/dL (ref 30.0–36.0)
MCV: 88.9 fL (ref 80.0–100.0)
Platelets: 178 10*3/uL (ref 150–400)
RBC: 3.77 MIL/uL — ABNORMAL LOW (ref 3.87–5.11)
RDW: 14.9 % (ref 11.5–15.5)
WBC: 7.1 10*3/uL (ref 4.0–10.5)
nRBC: 0 % (ref 0.0–0.2)

## 2023-12-13 LAB — GLUCOSE, CAPILLARY
Glucose-Capillary: 107 mg/dL — ABNORMAL HIGH (ref 70–99)
Glucose-Capillary: 105 mg/dL — ABNORMAL HIGH (ref 70–99)
Glucose-Capillary: 97 mg/dL (ref 70–99)
Glucose-Capillary: 101 mg/dL — ABNORMAL HIGH (ref 70–99)

## 2023-12-13 MED ORDER — BISACODYL 10 MG RE SUPP
10.0000 mg | Freq: Once | RECTAL | Status: AC
  Administered 2023-12-13: 10 mg via RECTAL
  Filled 2023-12-13: qty 1

## 2023-12-13 MED ORDER — HYDROCORTISONE 1 % EX CREA
TOPICAL_CREAM | Freq: Two times a day (BID) | CUTANEOUS | Status: AC
  Filled 2023-12-13: qty 28

## 2023-12-13 MED ORDER — SENNA 8.6 MG PO TABS
1.0000 | ORAL_TABLET | Freq: Two times a day (BID) | ORAL | Status: DC
  Administered 2023-12-13 – 2023-12-14 (×3): 8.6 mg via ORAL
  Filled 2023-12-13 (×4): qty 1

## 2023-12-13 NOTE — Plan of Care (Signed)

## 2023-12-13 NOTE — Plan of Care (Signed)
  Problem: Elimination: Goal: Will not experience complications related to bowel motility Outcome: Progressing Goal: Will not experience complications related to urinary retention Outcome: Progressing   Problem: Fluid Volume: Goal: Ability to maintain a balanced intake and output will improve Outcome: Progressing

## 2023-12-13 NOTE — Progress Notes (Signed)
 Progress Note   Patient: Angelica [name] FMW:[id number] DOB: Mar 26, 1951 DOA: 12/12/2023     1 DOS: the patient was seen and examined on 12/13/2023   Brief hospital course: 73 year old female history of hypertension, hyperlipidemia, DM type II, mood disorder, CKD, mood disorder, tardive dyskinesia and hypothyroidism presented to emergency department complaining of pain at the tailbone.  Patient reported symptom has been for 2 days.  Denies any injury and fall.  Reported pain worse when trying to sit. At presentation to ED patient is hemodynamically stable. BMP showing low potassium 3.4.  Creatinine 1.06 and GFR 55.  Renal function at baseline. CBC unremarkable.  CT abdomen pelvis showing stercoral colitis.Increased soft tissue thickening about the anal verge and perineum. No focal fluid collections or signs of patent fistula. Additionally, there is mildly increased skin thickening along bilateral gluteal folds and labia. Correlate for any clinical signs or symptoms of cellulitis.  Per Dr. Wanita physical exam no evidence of abscess and cellulitis.  However she has liquid stool in the perianal area with diffuse tenderness in the region.  Rectal exam showed significant tenderness and large amount of claylike stool in the rectal vault.  In the ED patient has been treated with ciprofloxacin  and metronidazole .  The patient has been started on bid miralax  bid and senna once daily. I have increased her senna to bid and added a bisacodyl  suppository.   Assessment and Plan: [name] is a 73 y.o. female with medical history significant for well-controlled type 2 diabetes, hypothyroidism, anxiety/depression recent hospitalization for hypotension being admitted to the hospital with rectal pain found to have constipation and stercoral colitis.   Constipation with stercoral colitis-the source of her rectal pain, no evidence of perforation or other complication. -Inpatient admission -Carb modified  diet -Bisacodyl  suppository, miralax  bid, and senna bid.  --Enema if no results. -Hydration -Empiric antibiotics for colitis   Type 2 diabetes-well-controlled, carb modified diet, continue Farxiga  and moderate dose sliding scale   Hypothyroidism-Synthroid    Hypotension-previously hospitalized for this, was discharged home on midodrine  which she has discontinued due to red dye allergy.  Blood pressure now stable.   Hyperlipidemia-Lipitor   Depression/anxiety-continue Lexapro , Lamictal , BuSpar    DVT prophylaxis: Lovenox       Code Status: Full Code  Subjective: The patient is resting comfortably. No new complaints. No further vomiting.  Physical Exam: Vitals:   12/12/23 2144 12/13/23 0100 12/13/23 0445 12/13/23 1135  BP: (!) 121/50 (!) 101/49 (!) 106/41 (!) 111/50  Pulse: 63 (!) 55 65 62  Resp:  16 17 18   Temp:  98.3 F (36.8 C) 98.6 F (37 C) 98.4 F (36.9 C)  TempSrc:  Oral Oral   SpO2:  97% 97% 99%  Weight:      Height:       Exam:  Constitutional:  The patient is awake, alert, and oriented x 3. No acute distress. Respiratory:  No increased work of breathing. No wheezes, rales, or rhonchi No tactile fremitus Cardiovascular:  Regular rate and rhythm No murmurs, ectopy, or gallups. No lateral PMI. No thrills. Abdomen:  Abdomen is soft, distended, and slightly tender. No hernias, masses, or organomegaly Normoactive bowel sounds.  Musculoskeletal:  No cyanosis, clubbing, or edema Skin:  No rashes, lesions, ulcers palpation of skin: no induration or nodules Neurologic:  CN 2-12 intact Sensation all 4 extremities intact Psychiatric:  Mental status Mood, affect appropriate Orientation to person, place, time  judgment and insight appear intact  Data Reviewed:  CT abdomen and pelvis  CBC BMP  Family Communication: Family at bedside  Disposition: Status is: Inpatient Remains inpatient appropriate because: Need to evacuate bowel and return to normal  function. Pt requires IV antibiotics for colitis.  Planned Discharge Destination: Home    Time spent: 35 minutes  Author: Theoden Mauch, DO 12/13/2023 5:57 PM  For on call review [email or web address].

## 2023-12-14 DIAGNOSIS — K6289 Other specified diseases of anus and rectum: Secondary | ICD-10-CM | POA: Diagnosis not present

## 2023-12-14 LAB — COMPREHENSIVE METABOLIC PANEL WITH GFR
ALT: 16 U/L (ref 0–44)
AST: 21 U/L (ref 15–41)
Albumin: 3 g/dL — ABNORMAL LOW (ref 3.5–5.0)
Alkaline Phosphatase: 90 U/L (ref 38–126)
Anion gap: 9 (ref 5–15)
BUN: 7 mg/dL — ABNORMAL LOW (ref 8–23)
CO2: 27 mmol/L (ref 22–32)
Calcium: 8.6 mg/dL — ABNORMAL LOW (ref 8.9–10.3)
Chloride: 105 mmol/L (ref 98–111)
Creatinine, Ser: 0.89 mg/dL (ref 0.44–1.00)
GFR, Estimated: >60 mL/min (ref >60)
Glucose, Bld: 93 mg/dL (ref 70–99)
Potassium: 3.5 mmol/L (ref 3.5–5.1)
Sodium: 141 mmol/L (ref 135–145)
Total Bilirubin: 0.9 mg/dL (ref 0.0–1.2)
Total Protein: 5.3 g/dL — ABNORMAL LOW (ref 6.5–8.1)

## 2023-12-14 LAB — CBC WITH DIFFERENTIAL/PLATELET
Abs Immature Granulocytes: 0.02 10*3/uL (ref 0.00–0.07)
Basophils Absolute: 0.1 10*3/uL (ref 0.0–0.1)
Basophils Relative: 1 %
Eosinophils Absolute: 0.9 10*3/uL — ABNORMAL HIGH (ref 0.0–0.5)
Eosinophils Relative: 15 %
HCT: 31.9 % — ABNORMAL LOW (ref 36.0–46.0)
Hemoglobin: 10.6 g/dL — ABNORMAL LOW (ref 12.0–15.0)
Immature Granulocytes: 0 %
Lymphocytes Relative: 31 %
Lymphs Abs: 1.8 10*3/uL (ref 0.7–4.0)
MCH: 29.3 pg (ref 26.0–34.0)
MCHC: 33.2 g/dL (ref 30.0–36.0)
MCV: 88.1 fL (ref 80.0–100.0)
Monocytes Absolute: 0.6 10*3/uL (ref 0.1–1.0)
Monocytes Relative: 10 %
Neutro Abs: 2.5 10*3/uL (ref 1.7–7.7)
Neutrophils Relative %: 43 %
Platelets: 190 10*3/uL (ref 150–400)
RBC: 3.62 MIL/uL — ABNORMAL LOW (ref 3.87–5.11)
RDW: 14.8 % (ref 11.5–15.5)
WBC: 5.8 10*3/uL (ref 4.0–10.5)
nRBC: 0 % (ref 0.0–0.2)

## 2023-12-14 LAB — GLUCOSE, CAPILLARY
Glucose-Capillary: 96 mg/dL (ref 70–99)
Glucose-Capillary: 86 mg/dL (ref 70–99)
Glucose-Capillary: 147 mg/dL — ABNORMAL HIGH (ref 70–99)
Glucose-Capillary: 97 mg/dL (ref 70–99)
Glucose-Capillary: 85 mg/dL (ref 70–99)

## 2023-12-14 MED ORDER — PEG 3350-KCL-NA BICARB-NACL 420 G PO SOLR
2000.0000 mL | Freq: Once | ORAL | Status: AC
  Administered 2023-12-14: 2000 mL via ORAL
  Filled 2023-12-14: qty 4000

## 2023-12-14 MED ORDER — SODIUM CHLORIDE 0.9% FLUSH: 10.0000 mL | INTRAVENOUS | Status: DC | PRN

## 2023-12-14 MED ORDER — BISACODYL 10 MG RE SUPP
10.0000 mg | Freq: Once | RECTAL | Status: AC
  Administered 2023-12-14: 10 mg via RECTAL
  Filled 2023-12-14: qty 1

## 2023-12-14 NOTE — Progress Notes (Signed)
 Progress Note   Patient: Angelica [name] FMW:[id number] DOB: 02-11-1951 DOA: 12/12/2023     2 DOS: the patient was seen and examined on 12/14/2023   Brief hospital course: 73 year old female history of hypertension, hyperlipidemia, DM type II, mood disorder, CKD, mood disorder, tardive dyskinesia and hypothyroidism presented to emergency department complaining of pain at the tailbone.  Patient reported symptom has been for 2 days.  Denies any injury and fall.  Reported pain worse when trying to sit. At presentation to ED patient is hemodynamically stable. BMP showing low potassium 3.4.  Creatinine 1.06 and GFR 55.  Renal function at baseline. CBC unremarkable.  CT abdomen pelvis showing stercoral colitis.Increased soft tissue thickening about the anal verge and perineum. No focal fluid collections or signs of patent fistula. Additionally, there is mildly increased skin thickening along bilateral gluteal folds and labia. Correlate for any clinical signs or symptoms of cellulitis.  Per Dr. Wanita physical exam no evidence of abscess and cellulitis.  However she has liquid stool in the perianal area with diffuse tenderness in the region.  Rectal exam showed significant tenderness and large amount of claylike stool in the rectal vault.  In the ED patient has been treated with ciprofloxacin  and metronidazole .  The patient has been started on bid miralax  bid and senna once daily. I have increased her senna to bid and added a bisacodyl  suppository. This has resulted in 2 small BM's.  12/14/2023 Miralax  has been discontinued. She will get 2 L of golytely  today with repeat Bisacodyl  suppository.  Assessment and Plan: Angelica [name] is a 73 y.o. female with medical history significant for well-controlled type 2 diabetes, hypothyroidism, anxiety/depression recent hospitalization for hypotension being admitted to the hospital with rectal pain found to have constipation and stercoral colitis.   Constipation  with stercoral colitis-the source of her rectal pain, no evidence of perforation or other complication. -Inpatient admission -Carb modified diet -Bisacodyl  suppository, golytely , and senna bid.  --Enema if no results. -Hydration -Empiric antibiotics for colitis   Type 2 diabetes-well-controlled, carb modified diet, continue Farxiga  and moderate dose sliding scale   Hypothyroidism-Synthroid    Hypotension-previously hospitalized for this, was discharged home on midodrine  which she has discontinued due to red dye allergy.  Blood pressure now stable.   Hyperlipidemia-Lipitor   Depression/anxiety-continue Lexapro , Lamictal , BuSpar    DVT prophylaxis: Lovenox       Code Status: Full Code  Subjective: The patient is resting comfortably. No new complaints. No further vomiting.  Physical Exam: Vitals:   12/13/23 2214 12/14/23 0446 12/14/23 1131 12/14/23 1244  BP: (!) 116/54 (!) 100/43 (!) 126/54 96/66  Pulse: (!) 59 (!) 56 (!) 59 69  Resp:  16  16  Temp:  98.4 F (36.9 C) 97.9 F (36.6 C) 97.9 F (36.6 C)  TempSrc:  Oral Oral Oral  SpO2:  99% 98% 98%  Weight:      Height:       Exam:  Constitutional:  The patient is awake, alert, and oriented x 3. No acute distress. Respiratory:  No increased work of breathing. No wheezes, rales, or rhonchi No tactile fremitus Cardiovascular:  Regular rate and rhythm No murmurs, ectopy, or gallups. No lateral PMI. No thrills. Abdomen:  Abdomen is soft, distended, and slightly tender. No hernias, masses, or organomegaly Normoactive bowel sounds.  Musculoskeletal:  No cyanosis, clubbing, or edema Skin:  No rashes, lesions, ulcers palpation of skin: no induration or nodules Neurologic:  CN 2-12 intact Sensation all 4 extremities intact Psychiatric:  Mental status Mood, affect appropriate Orientation to person, place, time  judgment and insight appear intact  Data Reviewed:  CT abdomen and pelvis CBC BMP  Family  Communication: Family at bedside  Disposition: Status is: Inpatient Remains inpatient appropriate because: Need to evacuate bowel and return to normal function. Pt requires IV antibiotics for colitis.  Planned Discharge Destination: Home    Time spent: 32 minutes  Author: Brigida Bureau, DO 12/14/2023 6:41 PM  For on call review [email or web address].

## 2023-12-14 NOTE — Plan of Care (Signed)

## 2023-12-15 DIAGNOSIS — K6289 Other specified diseases of anus and rectum: Secondary | ICD-10-CM | POA: Diagnosis not present

## 2023-12-15 LAB — BASIC METABOLIC PANEL WITH GFR
Anion gap: 7 (ref 5–15)
BUN: 6 mg/dL — ABNORMAL LOW (ref 8–23)
CO2: 26 mmol/L (ref 22–32)
Calcium: 8.4 mg/dL — ABNORMAL LOW (ref 8.9–10.3)
Chloride: 106 mmol/L (ref 98–111)
Creatinine, Ser: 0.91 mg/dL (ref 0.44–1.00)
GFR, Estimated: >60 mL/min (ref >60)
Glucose, Bld: 100 mg/dL — ABNORMAL HIGH (ref 70–99)
Potassium: 3.1 mmol/L — ABNORMAL LOW (ref 3.5–5.1)
Sodium: 139 mmol/L (ref 135–145)

## 2023-12-15 LAB — CBC WITH DIFFERENTIAL/PLATELET
Abs Immature Granulocytes: 0.04 10*3/uL (ref 0.00–0.07)
Basophils Absolute: 0.1 10*3/uL (ref 0.0–0.1)
Basophils Relative: 1 %
Eosinophils Absolute: 0.9 10*3/uL — ABNORMAL HIGH (ref 0.0–0.5)
Eosinophils Relative: 12 %
HCT: 33.1 % — ABNORMAL LOW (ref 36.0–46.0)
Hemoglobin: 10.6 g/dL — ABNORMAL LOW (ref 12.0–15.0)
Immature Granulocytes: 1 %
Lymphocytes Relative: 29 %
Lymphs Abs: 2.1 10*3/uL (ref 0.7–4.0)
MCH: 29 pg (ref 26.0–34.0)
MCHC: 32 g/dL (ref 30.0–36.0)
MCV: 90.4 fL (ref 80.0–100.0)
Monocytes Absolute: 0.7 10*3/uL (ref 0.1–1.0)
Monocytes Relative: 9 %
Neutro Abs: 3.6 10*3/uL (ref 1.7–7.7)
Neutrophils Relative %: 48 %
Platelets: 185 10*3/uL (ref 150–400)
RBC: 3.66 MIL/uL — ABNORMAL LOW (ref 3.87–5.11)
RDW: 15 % (ref 11.5–15.5)
WBC: 7.3 10*3/uL (ref 4.0–10.5)
nRBC: 0 % (ref 0.0–0.2)

## 2023-12-15 LAB — GLUCOSE, CAPILLARY
Glucose-Capillary: 87 mg/dL (ref 70–99)
Glucose-Capillary: 119 mg/dL — ABNORMAL HIGH (ref 70–99)

## 2023-12-15 MED ORDER — SENNA 8.6 MG PO TABS: 1.0000 | ORAL_TABLET | Freq: Two times a day (BID) | ORAL | 0 refills | Status: AC

## 2023-12-15 MED ORDER — POTASSIUM CHLORIDE CRYS ER 20 MEQ PO TBCR
40.0000 meq | EXTENDED_RELEASE_TABLET | Freq: Once | ORAL | Status: AC
  Administered 2023-12-15: 40 meq via ORAL
  Filled 2023-12-15: qty 2

## 2023-12-15 NOTE — Plan of Care (Signed)

## 2023-12-15 NOTE — Discharge Summary (Signed)
 Physician Discharge Summary  Angelica [name] FMW:[id number] DOB: September 12, 1950 DOA: 12/12/2023  PCP: [name], PA-C  Admit date: 12/12/2023 Discharge date: 12/15/2023  Admitted From: Home Discharge disposition: Home  Recommendations at discharge:  Regulate your bowel movement with scheduled and as needed meds    Brief narrative: Angelica [name] is a 73 y.o. female with PMH significant for HTN, HLD, CKD, mood disorder, tardive dyskinesia, hypothyroidism 6/20, patient presented to the ED with complaint of pain at the tailbone for last 2 days.  Initial workup with CT abdomen showed stercoral colitis.  Noted as an increased soft tissue thickening about the anal verge and perineum without any focal fluid collections or signs of patent fistula.  On external exam exam, there was no finding of cellulitis or abscess. Rectal exam showed significant tenderness and large amount of claylike stool in the rectal vault.   Patient was started on IV Rocephin  and Flagyl  Admitted to [facility]  Patient was tried on MiraLAX  and senna without effect and hence on 6/22 started on GoLytely  after which she had multiple bowel movements.  Subjective: Patient was seen and examined this morning. Pleasant elderly Caucasian female.  Sitting up in bed.  Feels better after bowel movement with GoLytely .  Feels ready to go home. Chart reviewed. In the last 24 hours, afebrile, heart rate in 50s and 60s, blood pressure in low normal range, breathing on room air. Labs from this morning with WC count 7.3, hemoglobin 10.6, potassium low at 3.1  Assessment and plan: Constipation Stercoral colitis Presented with rectal pain.  CT imaging with a stercoral colitis, large amount of stool and no evidence of perforation or abscess. Successful bowel movement with GoLytely . Rectal regular bowel regimen with scheduled Senokot and as needed MiraLAX . No fever.  WBC count normal.  Received 3 days of IV Rocephin  and IV Flagyl .  I do  not think she needs antibiotics at discharge. Recent Labs  Lab 12/12/23 0437 12/13/23 0505 12/14/23 0503 12/15/23 0340  WBC 9.0 7.1 5.8 7.3   Hypokalemia  Potassium low at 3.1 today..  Oral replacement given. Recent Labs  Lab 12/12/23 0437 12/13/23 0505 12/14/23 0503 12/15/23 0340  K 3.4* 3.5 3.5 3.1*   CKD 2 On Farxiga .  No history of diabetes Recent Labs    07/12/23 1442 09/15/23 1121 11/26/23 1651 11/27/23 0006 11/27/23 0921 11/28/23 9391 12/12/23 0437 12/13/23 0505 12/14/23 0503 12/15/23 0340  BUN 19 13 19 16 14 9 8 8  7* 6*  CREATININE 1.13* 1.17* 2.08* 1.63* 1.52* 1.20* 1.06* 0.94 0.89 0.91   Hypothyroidism-Synthroid    Hypertension It seems she was taking HCTZ and lisinopril  at home.  Okay to resume the same.   Hyperlipidemia-Lipitor   Depression/anxiety-continue Lexapro , Lamictal , BuSpar    Mobility: Independently able to ambulate  Goals of care   Code Status: Full Code   Diet:  Diet Order             Diet general           Diet Carb Modified Fluid consistency: Thin; Room service appropriate? Yes  Diet effective now                   Nutritional status:  Body mass index is 23.03 kg/m.       Wounds:  -    Discharge Exam:   Vitals:   12/14/23 1131 12/14/23 1244 12/14/23 1955 12/15/23 0443  BP: (!) 126/54 96/66 (!) 122/54 (!) 107/42  Pulse: (!) 59 69 65 (!)  58  Resp:  16 15 16   Temp: 97.9 F (36.6 C) 97.9 F (36.6 C) 98 F (36.7 C) 98.3 F (36.8 C)  TempSrc: Oral Oral Oral   SpO2: 98% 98% 100% 99%  Weight:      Height:        Body mass index is 23.03 kg/m.  General exam: Pleasant, elderly Caucasian female.  Not in distress Skin: No rashes, lesions or ulcers. HEENT: Atraumatic, normocephalic, no obvious bleeding Lungs: Clear to auscultation bilaterally,  CVS: S1, S2, no murmur,   GI/Abd: Soft, nontender, nondistended, bowel sound present,   CNS: Alert, awake, oriented x 3 Psychiatry: Mood appropriate,   Extremities: No pedal edema, no calf tenderness,   Follow ups:    Follow-up Information     [name], PA-C Follow up.   Specialty: Family Medicine Contact information: [address] [place] [address] [phone]                 Discharge Instructions:   Discharge Instructions     Call MD for:  difficulty breathing, headache or visual disturbances   Complete by: As directed    Call MD for:  extreme fatigue   Complete by: As directed    Call MD for:  hives   Complete by: As directed    Call MD for:  persistant dizziness or light-headedness   Complete by: As directed    Call MD for:  persistant nausea and vomiting   Complete by: As directed    Call MD for:  severe uncontrolled pain   Complete by: As directed    Call MD for:  temperature >100.4   Complete by: As directed    Diet general   Complete by: As directed    Discharge instructions   Complete by: As directed    Recommendations at discharge:   Regulate your bowel movement with scheduled and as needed meds  General discharge instructions: Follow with Primary MD Antoniette Rasher CROME, PA-C in 7 days  Please request your PCP  to go over your hospital tests, procedures, radiology results at the follow up. Please get your medicines reviewed and adjusted.  Your PCP may decide to repeat certain labs or tests as needed. Do not drive, operate heavy machinery, perform activities at heights, swimming or participation in water activities or provide baby sitting services if your were admitted for syncope or siezures until you have seen by Primary MD or a Neurologist and advised to do so again. [place]  Controlled Substance Reporting System database was reviewed. Do not drive, operate heavy machinery, perform activities at heights, swim, participate in water activities or provide baby-sitting services while on medications for pain, sleep and mood until your outpatient physician has  reevaluated you and advised to do so again.  You are strongly recommended to comply with the dose, frequency and duration of prescribed medications. Activity: As tolerated with Full fall precautions use walker/cane & assistance as needed Avoid using any recreational substances like cigarette, tobacco, alcohol, or non-prescribed drug. If you experience worsening of your admission symptoms, develop shortness of breath, life threatening emergency, suicidal or homicidal thoughts you must seek medical attention immediately by calling 911 or calling your MD immediately  if symptoms less severe. You must read complete instructions/literature along with all the possible adverse reactions/side effects for all the medicines you take and that have been prescribed to you. Take any new medicine only after you have completely understood and accepted  all the possible adverse reactions/side effects.  Wear Seat belts while driving. You were cared for by a hospitalist during your hospital stay. If you have any questions about your discharge medications or the care you received while you were in the hospital after you are discharged, you can call the unit and ask to speak with the hospitalist or the covering physician. Once you are discharged, your primary care physician will handle any further medical issues. Please note that NO REFILLS for any discharge medications will be authorized once you are discharged, as it is imperative that you return to your primary care physician (or establish a relationship with a primary care physician if you do not have one).   Increase activity slowly   Complete by: As directed        Discharge Medications:   Allergies as of 12/15/2023       Reactions   Penicillins Hives, Swelling, Rash      Red Dye #40 (allura Red) Hives   Sulfa Antibiotics Nausea And Vomiting, Hives   Midodrine  Hives   Amitriptyline  Other (See Comments)   Crazy dreams   Macrodantin Nausea And Vomiting    Nitrofurantoin Nausea And Vomiting        Medication List     TAKE these medications    albuterol  108 (90 Base) MCG/ACT inhaler Commonly known as: VENTOLIN  HFA Inhale 2 puffs into the lungs every 6 (six) hours as needed for wheezing.   atorvastatin  80 MG tablet Commonly known as: LIPITOR Take 1 tablet (80 mg total) by mouth daily. What changed: when to take this   BENADRYL DYE-FREE ALLERGY PO Take 25 mg by mouth daily as needed (for itching or allergies).   budesonide -formoterol  160-4.5 MCG/ACT inhaler Commonly known as: SYMBICORT  Inhale 2 puffs into the lungs 2 (two) times daily. What changed:  when to take this reasons to take this   busPIRone  30 MG tablet Commonly known as: BUSPAR  Take 1 tablet (30 mg total) by mouth in the morning and at bedtime.   clonazePAM  0.5 MG tablet Commonly known as: KLONOPIN  TAKE 1 TABLET BY MOUTH AS NEEDED FOR ANXIETY . DO NOT EXCEED 1 PER 24 HOURS What changed:  how much to take how to take this when to take this reasons to take this additional instructions   dapagliflozin  propanediol 10 MG Tabs tablet Commonly known as: Farxiga  Take 1 tablet (10 mg total) by mouth daily.   dicyclomine  10 MG capsule Commonly known as: BENTYL  Take 1 capsule (10 mg total) by mouth 2 (two) times daily.   escitalopram  20 MG tablet Commonly known as: LEXAPRO  Take 1 tablet by mouth once daily What changed:  how much to take how to take this when to take this additional instructions   hydrochlorothiazide  12.5 MG tablet Commonly known as: HYDRODIURIL  Take 12.5 mg by mouth daily.   hydrocortisone  2.5 % rectal cream Commonly known as: Anusol -HC Place 1 Application rectally 2 (two) times daily.   hydrOXYzine  10 MG tablet Commonly known as: ATARAX  Take one to two tablets as needed for itching up to three times a day. What changed:  how much to take how to take this when to take this reasons to take this additional instructions    ipratropium-albuterol  0.5-2.5 (3) MG/3ML Soln Commonly known as: DUONEB Take 3 mLs by nebulization every 4 (four) hours as needed (wheeze, SOB). What changed: reasons to take this   lamoTRIgine  100 MG tablet Commonly known as: LaMICtal  Take 1 tablet (100 mg total)  by mouth 2 (two) times daily. What changed: when to take this   levothyroxine  50 MCG tablet Commonly known as: SYNTHROID  Take 1 tablet (50 mcg total) by mouth daily before breakfast. TAKE 1 TABLET(50 MCG) BY MOUTH DAILY   lisinopril  40 MG tablet Commonly known as: ZESTRIL  Take 40 mg by mouth daily.   MiraLax  17 GM/SCOOP powder Generic drug: polyethylene glycol powder Take 17 g by mouth daily.   senna 8.6 MG Tabs tablet Commonly known as: SENOKOT Take 1 tablet (8.6 mg total) by mouth 2 (two) times daily.   traZODone  50 MG tablet Commonly known as: DESYREL  TAKE 1/2 TO 1 TABLET(25 TO 50 MG) BY MOUTH AT BEDTIME AS NEEDED FOR SLEEP What changed: See the new instructions.         The results of significant diagnostics from this hospitalization (including imaging, microbiology, ancillary and laboratory) are listed below for reference.    Procedures and Diagnostic Studies:   CT PELVIS W CONTRAST Result Date: 12/12/2023 CLINICAL DATA:  Evaluate for perianal fistula, perianal abscess, or proctitis. Complains of pain when sitting. History of breast cancer. EXAM: CT PELVIS WITH CONTRAST TECHNIQUE: Multidetector CT imaging of the pelvis was performed using the standard protocol following the bolus administration of intravenous contrast. RADIATION DOSE REDUCTION: This exam was performed according to the departmental dose-optimization program which includes automated exposure control, adjustment of the mA and/or kV according to patient size and/or use of iterative reconstruction technique. CONTRAST:  OMNIPAQUE  IOHEXOL  300 MG/ML  SOLN COMPARISON:  07/12/2023. FINDINGS: Urinary Tract:  No abnormality visualized. Bowel: The  visualized portions of the lower abdominal bowel loops are nondilated without wall thickening or inflammation. Sigmoid diverticulosis without signs of acute diverticulitis. Moderate volume of desiccated stool is identified within the rectum there is mild wall thickening involving the proximal rectum. Perirectal and presacral soft tissue stranding noted. No significant free fluid or fluid collections identified within the pelvis. Increased skin thickening about the anal verge and perineum. No focal fluid collections or signs of patent fistula. Mildly increased soft tissue along bilateral gluteal folds and labia. Vascular/Lymphatic: Aortic atherosclerosis. No abdominopelvic adenopathy. Reproductive:  No mass or other significant abnormality Other: No free fluid or focal fluid collections identified. Specifically no signs of drainable abscess. Musculoskeletal: No acute or suspicious osseous findings. L4-5 degenerative disc disease. Bilateral facet arthropathy within the lower lumbar spine. IMPRESSION: 1. Moderate volume of desiccated stool is identified within the rectum. There is mild wall thickening involving the proximal rectum. Perirectal and presacral soft tissue stranding noted. Findings are compatible with stercoral colitis. 2. Increased soft tissue thickening about the anal verge and perineum. No focal fluid collections or signs of patent fistula. Additionally, there is mildly increased skin thickening along bilateral gluteal folds and labia. Correlate for any clinical signs or symptoms of cellulitis. 3. Sigmoid diverticulosis without signs of acute diverticulitis. 4.  Aortic Atherosclerosis (ICD10-I70.0). Electronically Signed   By: Waddell Calk M.D.   On: 12/12/2023 05:56     Labs:   Basic Metabolic Panel: Recent Labs  Lab 12/12/23 0437 12/13/23 0505 12/14/23 0503 12/15/23 0340  NA 142 138 141 139  K 3.4* 3.5 3.5 3.1*  CL 103 101 105 106  CO2 26 26 27 26   GLUCOSE 108* 97 93 100*  BUN 8 8  7* 6*  CREATININE 1.06* 0.94 0.89 0.91  CALCIUM  9.4 8.7* 8.6* 8.4*   GFR Estimated Creatinine Clearance: 45.5 mL/min (by C-G formula based on SCr of 0.91 mg/dL). Liver Function  Tests: Recent Labs  Lab 12/14/23 0503  AST 21  ALT 16  ALKPHOS 90  BILITOT 0.9  PROT 5.3*  ALBUMIN 3.0*   No results for input(s): LIPASE, AMYLASE in the last 168 hours. No results for input(s): AMMONIA in the last 168 hours. Coagulation profile No results for input(s): INR, PROTIME in the last 168 hours.  CBC: Recent Labs  Lab 12/12/23 0437 12/13/23 0505 12/14/23 0503 12/15/23 0340  WBC 9.0 7.1 5.8 7.3  NEUTROABS 5.0  --  2.5 3.6  HGB 11.3* 11.1* 10.6* 10.6*  HCT 33.2* 33.5* 31.9* 33.1*  MCV 85.3 88.9 88.1 90.4  PLT 236 178 190 185   Cardiac Enzymes: No results for input(s): CKTOTAL, CKMB, CKMBINDEX, TROPONINI in the last 168 hours. BNP: Invalid input(s): POCBNP CBG: Recent Labs  Lab 12/14/23 1157 12/14/23 1613 12/14/23 1716 12/14/23 2119 12/15/23 0717  GLUCAP 86 147* 97 85 87   D-Dimer No results for input(s): DDIMER in the last 72 hours. Hgb A1c No results for input(s): HGBA1C in the last 72 hours. Lipid Profile No results for input(s): CHOL, HDL, LDLCALC, TRIG, CHOLHDL, LDLDIRECT in the last 72 hours. Thyroid  function studies No results for input(s): TSH, T4TOTAL, T3FREE, THYROIDAB in the last 72 hours.  Invalid input(s): FREET3 Anemia work up No results for input(s): VITAMINB12, FOLATE, FERRITIN, TIBC, IRON, RETICCTPCT in the last 72 hours. Microbiology No results found for this or any previous visit (from the past 240 hours).  Time coordinating discharge: 45 minutes  Signed: Akila Batta  [facility] 12/15/2023, 10:54 AM

## 2023-12-16 ENCOUNTER — Telehealth: Payer: Self-pay

## 2023-12-16 NOTE — Transitions of Care (Post Inpatient/ED Visit) (Signed)
   12/16/2023  Name: Angelica [name] MRN: [id number] DOB: [birth year]  Today's TOC FU Call Status: Today's TOC FU Call Status:: Unsuccessful Call (1st Attempt) Unsuccessful Call (1st Attempt) Date: 12/16/23  Attempted to reach the patient regarding the most recent Inpatient/ED visit.  Follow Up Plan: Additional outreach attempts will be made to reach the patient to complete the Transitions of Care (Post Inpatient/ED visit) call.   Shona Prow RN, CCM [place]  VBCI-Population Health RN Care Manager [phone]

## 2023-12-17 ENCOUNTER — Telehealth: Payer: Self-pay | Admitting: *Deleted

## 2023-12-17 NOTE — Transitions of Care (Post Inpatient/ED Visit) (Signed)
   12/17/2023  Name: Angelica [name] MRN: [id number] DOB: [birth year]  Today's TOC FU Call Status: Today's TOC FU Call Status:: Unsuccessful Call (2nd Attempt) Unsuccessful Call (2nd Attempt) Date: 12/17/23  Attempted to reach the patient regarding the most recent Inpatient/ED visit.  Follow Up Plan: Additional outreach attempts will be made to reach the patient to complete the Transitions of Care (Post Inpatient/ED visit) call.   Cathlean Headland BSN RN [facility] Health Care Management Coordinator [email or web address] Direct Dial: [phone]  Fax: [phone] Website: [email or web address]

## 2023-12-18 ENCOUNTER — Telehealth: Payer: Self-pay

## 2023-12-18 NOTE — Transitions of Care (Post Inpatient/ED Visit) (Signed)
   12/18/2023  Name: Angelica [name] MRN: [id number] DOB: [birth year]  Today's TOC FU Call Status: Today's TOC FU Call Status:: Successful TOC FU Call Completed TOC FU Call Complete Date: 12/18/23 Patient's Name and Date of Birth confirmed.  TOC RN spoke with patient and explained program - Patient declined TOC call stating she follows with her PCP and doesn't feel the call is needed at this time. Encouraged patient to call with any issues/concerns   Shona Prow RN, CCM [place]  VBCI-Population Health RN Care Manager [phone]

## 2023-12-31 ENCOUNTER — Encounter: Payer: Self-pay | Admitting: Physician Assistant

## 2023-12-31 ENCOUNTER — Ambulatory Visit (INDEPENDENT_AMBULATORY_CARE_PROVIDER_SITE_OTHER): Admitting: Physician Assistant

## 2023-12-31 VITALS — BP 122/75 | HR 75 | Ht 63.0 in | Wt 125.0 lb

## 2023-12-31 DIAGNOSIS — K5900 Constipation, unspecified: Secondary | ICD-10-CM | POA: Diagnosis not present

## 2023-12-31 DIAGNOSIS — K5289 Other specified noninfective gastroenteritis and colitis: Secondary | ICD-10-CM | POA: Diagnosis not present

## 2023-12-31 DIAGNOSIS — Z09 Encounter for follow-up examination after completed treatment for conditions other than malignant neoplasm: Secondary | ICD-10-CM

## 2023-12-31 NOTE — Progress Notes (Signed)
 Established Patient Office Visit  Subjective   Patient ID: Angelica [name], female    DOB: 1951-01-01  Age: 73 y.o. MRN: [id number]  Chief Complaint  Patient presents with   Hospitalization Follow-up    stercoral colits, pt stated 3 days    Patient is here for a hospitalization follow-up. She was admitted to the hospital on 12/12/2023 for constipation and stercoral colitis following a 2 day history of tail bone pain. She was admitted and treated with GoLytely  and a 3-day course of IV Rocephin  and IV Flagyl . Patient had a successful bowel movement prior to discharge on 12/15/2023. She was not given any antibiotics at discharge d/t no fever present and normal WBCs. Patient prescribed daily Senakot and Miralax  for bowel regimen.    Patient is doing well following hospitalization. She has been having daily normal bowel movements and has not experienced any abdominal cramping, bloating, or pain. Her BP has been within range at home and overall she has been feeling well. Patient is making sure she is eating throughout the day and staying hydrated. Discussed with patient the side effects of dicyclomine  (medication she has been taking for a few years) and that constipation risk is higher with this med, so to stop taking for now. Patient has been losing weight with diet changes and wanted to see if she should continue to lose any more weight at this time, her BMI is 22.1, so she was advised to maintain her weight now.   Review of Systems  Constitutional:  Negative for fever and malaise/fatigue.  Gastrointestinal:  Negative for abdominal pain, constipation, diarrhea, nausea and vomiting.  All other systems reviewed and are negative.    Objective:     BP 122/75   Pulse 75   Ht 5' 3 (1.6 m)   Wt 125 lb (56.7 kg)   LMP 06/24/2000   SpO2 99%   BMI 22.14 kg/m  BP Readings from Last 3 Encounters:  12/31/23 122/75  12/15/23 (!) 107/42  12/03/23 (!) 114/49   Wt Readings from Last 3 Encounters:   12/31/23 125 lb (56.7 kg)  12/12/23 130 lb (59 kg)  12/03/23 131 lb (59.4 kg)      Physical Exam Vitals reviewed.  Constitutional:      Appearance: Normal appearance. She is normal weight.  Cardiovascular:     Rate and Rhythm: Normal rate and regular rhythm.     Pulses: Normal pulses.     Heart sounds: Normal heart sounds.  Abdominal:     General: Abdomen is flat. Bowel sounds are normal. There is no distension.     Palpations: Abdomen is soft.     Tenderness: There is no abdominal tenderness.  Musculoskeletal:        General: Normal range of motion.  Skin:    General: Skin is warm and dry.  Neurological:     Mental Status: She is alert and oriented to person, place, and time.  Psychiatric:        Mood and Affect: Mood normal.        Behavior: Behavior normal.      Last CBC Lab Results  Component Value Date   WBC 7.3 12/15/2023   HGB 10.6 (L) 12/15/2023   HCT 33.1 (L) 12/15/2023   MCV 90.4 12/15/2023   MCH 29.0 12/15/2023   RDW 15.0 12/15/2023   PLT 185 12/15/2023   Last metabolic panel Lab Results  Component Value Date   GLUCOSE 100 (H) 12/15/2023   NA 139  12/15/2023   K 3.1 (L) 12/15/2023   CL 106 12/15/2023   CO2 26 12/15/2023   BUN 6 (L) 12/15/2023   CREATININE 0.91 12/15/2023   GFRNONAA >60 12/15/2023   CALCIUM  8.4 (L) 12/15/2023   PHOS 3.2 11/27/2023   PROT 5.3 (L) 12/14/2023   ALBUMIN 3.0 (L) 12/14/2023   LABGLOB 2.0 06/16/2023   BILITOT 0.9 12/14/2023   ALKPHOS 90 12/14/2023   AST 21 12/14/2023   ALT 16 12/14/2023   ANIONGAP 7 12/15/2023   Last vitamin B12 and Folate Lab Results  Component Value Date   VITAMINB12 >2,000 (H) 05/15/2022   FOLATE 9.7 05/15/2022      The 10-year ASCVD risk score (Arnett DK, et al., 2019) is: 12%    Assessment & Plan:  [name] was seen today for hospitalization follow-up.  Diagnoses and all orders for this visit:  Hospital discharge follow-up  Stercoral colitis  Constipation, unspecified  constipation type  Discussed with patient importance of preventing constipation - Continue daily Senakot and Miralax  - Stop Dicyclomine  d/t constipation as side effect - Encouraged hydration, regular meals discussed potential for additional prescriptions for Linzess or other meds if pt begins to experience abdominal sxs   Hx of Hypertension - BP; 122/75; taking Bps at home and keeping log (home Bps within range and a lot of low readings in the low 100s over 60s.  - Stop hydrochlorothiazide  due to Bps being within range - Educated patient to continue to check BP at home and to send message and restart hydrochlorothiazide  if Bps begin to rise again or she notices swelling  Hypothyroidism - Continue synthroid   Chronic kidney disease - Continue Farxiga   - GFR 45.5 mL/min (SCr of 0.91 mg/dL)  Hyperlipidemia - Continue atorvastatin    Depression Anxiety - Continue Buspar , Lexapro , Lamictal  - Continue Klonopin  and Trazadone as needed   Return in about 3 months (around 04/01/2024).    Ashiah Karpowicz, PA-C

## 2023-12-31 NOTE — Patient Instructions (Signed)
 Stop Bentyl . Stop hydrochlorothiazide .  Keep monitoring BP at home.  Make sure drinking lots of water Continue miralax  daily and slowing start coming off of it keeping to well formed regular bowel movements.

## 2024-01-07 ENCOUNTER — Ambulatory Visit: Admitting: Physician Assistant

## 2024-02-24 ENCOUNTER — Encounter: Payer: Self-pay | Admitting: Sports Medicine

## 2024-02-25 ENCOUNTER — Encounter: Payer: Self-pay | Admitting: Physician Assistant

## 2024-03-04 ENCOUNTER — Other Ambulatory Visit: Payer: Self-pay

## 2024-03-04 NOTE — Telephone Encounter (Signed)
 Copied from CRM [id number]. Topic: Clinical - Medical Advice >> Mar 04, 2024  9:02 AM Angelica [name] wrote: Reason for CRM: Patient called in requesting to speak with nurse about which medications she needs to stop taking. Per Dr. Antoniette pt needed to stop taking BP medication but patient removed 3 from her medication list. Patient unsure which medications she removed and needs clarification. Please contact patient at [phone].

## 2024-03-04 NOTE — Telephone Encounter (Signed)
 In reading chart note - I only saw that  2 medications were stopped - bentyl  and hydrochlorothiazide . Is there a third mediation as well?

## 2024-03-05 MED ORDER — LISINOPRIL 2.5 MG PO TABS
2.5000 mg | ORAL_TABLET | Freq: Every day | ORAL | 0 refills | Status: DC
Start: 2024-03-05 — End: 2024-04-05

## 2024-03-05 NOTE — Telephone Encounter (Signed)
 Attempted call to patient.left a voice mail message requesting a return call.

## 2024-03-05 NOTE — Telephone Encounter (Signed)
 Spoke with patient would like the prescription sent to pharmacy for Lisinopril  2.5mg   In checking chart does not show a 2.5 mg prescription in past history - pended this prescription for sending

## 2024-03-07 ENCOUNTER — Other Ambulatory Visit: Payer: Self-pay | Admitting: Physician Assistant

## 2024-03-07 DIAGNOSIS — I10 Essential (primary) hypertension: Secondary | ICD-10-CM

## 2024-03-09 ENCOUNTER — Other Ambulatory Visit: Payer: Self-pay | Admitting: Physician Assistant

## 2024-03-09 DIAGNOSIS — F39 Unspecified mood [affective] disorder: Secondary | ICD-10-CM

## 2024-03-09 DIAGNOSIS — F419 Anxiety disorder, unspecified: Secondary | ICD-10-CM

## 2024-03-17 ENCOUNTER — Ambulatory Visit: Admitting: Physician Assistant

## 2024-03-19 ENCOUNTER — Telehealth: Payer: Self-pay

## 2024-03-19 NOTE — Telephone Encounter (Signed)
 PAP: Patient assistance application for Farxiga  through [employer] (AZ&Me) has been mailed to pt's home address on file. Provider portion of application will be faxed to provider's office.For re-enrollment 2026.

## 2024-03-24 ENCOUNTER — Telehealth: Payer: Self-pay

## 2024-03-24 NOTE — Progress Notes (Signed)
   03/24/2024  Patient ID: Angelica [name], female   DOB: [birth year], 73 y.o.   MRN: [id number]  Returning missed call/voicemail from patient in regard to [employer] 2026 re-enrollment application.  I was not able to reach the patient but did leave a HIPAA compliant voicemail with my direct phone number.  Angelica [name], PharmD, DPLA

## 2024-03-30 ENCOUNTER — Other Ambulatory Visit (HOSPITAL_COMMUNITY): Payer: Self-pay

## 2024-03-30 NOTE — Telephone Encounter (Signed)
 Received patient portion of PAP application Farxiga  (AZ&ME)

## 2024-04-02 ENCOUNTER — Ambulatory Visit (INDEPENDENT_AMBULATORY_CARE_PROVIDER_SITE_OTHER): Admitting: Physician Assistant

## 2024-04-02 VITALS — BP 140/58 | HR 58 | Ht 63.0 in | Wt 125.0 lb

## 2024-04-02 DIAGNOSIS — E785 Hyperlipidemia, unspecified: Secondary | ICD-10-CM | POA: Diagnosis not present

## 2024-04-02 DIAGNOSIS — J4541 Moderate persistent asthma with (acute) exacerbation: Secondary | ICD-10-CM

## 2024-04-02 DIAGNOSIS — R0602 Shortness of breath: Secondary | ICD-10-CM

## 2024-04-02 DIAGNOSIS — F411 Generalized anxiety disorder: Secondary | ICD-10-CM

## 2024-04-02 DIAGNOSIS — N1831 Chronic kidney disease, stage 3a: Secondary | ICD-10-CM | POA: Diagnosis not present

## 2024-04-02 DIAGNOSIS — F419 Anxiety disorder, unspecified: Secondary | ICD-10-CM | POA: Diagnosis not present

## 2024-04-02 DIAGNOSIS — Z79899 Other long term (current) drug therapy: Secondary | ICD-10-CM

## 2024-04-02 DIAGNOSIS — I10 Essential (primary) hypertension: Secondary | ICD-10-CM | POA: Diagnosis not present

## 2024-04-02 DIAGNOSIS — F39 Unspecified mood [affective] disorder: Secondary | ICD-10-CM

## 2024-04-02 DIAGNOSIS — Z23 Encounter for immunization: Secondary | ICD-10-CM | POA: Diagnosis not present

## 2024-04-02 DIAGNOSIS — F439 Reaction to severe stress, unspecified: Secondary | ICD-10-CM

## 2024-04-02 DIAGNOSIS — Z1231 Encounter for screening mammogram for malignant neoplasm of breast: Secondary | ICD-10-CM

## 2024-04-02 DIAGNOSIS — Z87891 Personal history of nicotine dependence: Secondary | ICD-10-CM

## 2024-04-02 DIAGNOSIS — F32A Depression, unspecified: Secondary | ICD-10-CM | POA: Diagnosis not present

## 2024-04-02 DIAGNOSIS — R5383 Other fatigue: Secondary | ICD-10-CM

## 2024-04-02 NOTE — Progress Notes (Signed)
 Established Patient Office Visit  Subjective   Patient ID: Angelica [name], female    DOB: [birth year]  Age: 73 y.o. MRN: [id number]     HPI Discussed the use of AI scribe software for clinical note transcription with the patient, who gave verbal consent to proceed.  History of Present Illness Angelica [name] is a 73 year old female with hypertension who presents for blood pressure management and routine follow-up.  Hypertension management - Monitors blood pressure regularly with occasional elevated readings, particularly in the morning - Elevated readings attributed to diet, stress, and illness - Currently taking lisinopril  2.5 mg daily after breakfast - No adverse effects from antihypertensive medication  General well-being and fatigue - Feels generally well with no significant complaints - Experiences intermittent fatigue and excessive sleep at times - Sleeps well most nights - Mood is stable on current medications. Denies any SI/HC.   Hyperlipidemia management - Lipitor dosage increased at last visit for improved cholesterol control  Hypothyroidism - no concerns, taking medication daily  Hyperlipidemia - no concerns  Asthma - no concerns - taking symbicort  daily  Preventive health maintenance - Received recent influenza and COVID vaccinations - Due for mammogram and routine laboratory work  Weight management - Current weight is 125 pounds - Cautious about trying new weight loss methods, including herbal teas, due to potential health effects    ROS See HPI.    Objective:     BP (!) 140/58   Pulse (!) 58   Ht 5' 3 (1.6 m)   Wt 125 lb (56.7 kg)   LMP 06/24/2000   SpO2 99%   BMI 22.14 kg/m  BP Readings from Last 3 Encounters:  04/02/24 (!) 140/58  12/31/23 122/75  12/15/23 (!) 107/42   Wt Readings from Last 3 Encounters:  04/02/24 125 lb (56.7 kg)  12/31/23 125 lb (56.7 kg)  12/12/23 130 lb (59 kg)    ..    04/02/2024    3:17 PM 09/15/2023    10:59 AM 06/16/2023   12:45 PM 04/21/2023    9:48 AM 02/14/2023   10:35 AM  Depression screen PHQ 2/9  Decreased Interest 3 0 0 0 0  Down, Depressed, Hopeless 1 0 0 0 0  PHQ - 2 Score 4 0 0 0 0  Altered sleeping 1 2 0    Tired, decreased energy 0 2 0    Change in appetite 0 0 0    Feeling bad or failure about yourself  0 0 0    Trouble concentrating 0 2 0    Moving slowly or fidgety/restless 0 2 0    Suicidal thoughts 0 0 0    PHQ-9 Score 5 8 0    Difficult doing work/chores Not difficult at all Not difficult at all Not difficult at all     ..    04/02/2024    3:17 PM 09/15/2023   10:59 AM 06/16/2023   12:45 PM 02/14/2023   10:35 AM  GAD 7 : Generalized Anxiety Score  Nervous, Anxious, on Edge 0 2 1 1   Control/stop worrying  2 1 1   Worry too much - different things 0 2 0 1  Trouble relaxing 0 2 0 0  Restless 0 2 0 1  Easily annoyed or irritable 0 0 0 0  Afraid - awful might happen 0 2 0 0  Total GAD 7 Score  12 2 4   Anxiety Difficulty Not difficult at all Somewhat difficult Not difficult at  all Not difficult at all      Physical Exam Constitutional:      Appearance: Normal appearance.  HENT:     Head: Normocephalic.  Neck:     Vascular: No carotid bruit.  Cardiovascular:     Rate and Rhythm: Normal rate and regular rhythm.     Heart sounds: Normal heart sounds.  Pulmonary:     Effort: Pulmonary effort is normal.     Breath sounds: Normal breath sounds.  Musculoskeletal:     Cervical back: Normal range of motion and neck supple.     Right lower leg: No edema.     Left lower leg: No edema.  Neurological:     General: No focal deficit present.     Mental Status: She is alert and oriented to person, place, and time.  Psychiatric:        Mood and Affect: Mood normal.      The 10-year ASCVD risk score (Arnett DK, et al., 2019) is: 19.1%    Assessment & Plan:  [name] was seen today for medical management of chronic issues.  Diagnoses and all orders for  this visit:  Essential hypertension -     CMP14+EGFR  Anxiety and depression -     clonazePAM  (KLONOPIN ) 0.5 MG tablet; Take 1 tablet (0.5 mg total) by mouth daily as needed for anxiety (cannot exceed 0.5 mg/24 hours). -     escitalopram  (LEXAPRO ) 20 MG tablet; Take 1 tablet (20 mg total) by mouth daily. -     busPIRone  (BUSPAR ) 30 MG tablet; Take 1 tablet (30 mg total) by mouth in the morning and at bedtime.  Episodic mood disorder  Need for influenza vaccination  Need for COVID-19 vaccine  GAD (generalized anxiety disorder)  Dyslipidemia (high LDL; low HDL) -     Lipid panel  Stage 3a chronic kidney disease (HCC) -     CMP14+EGFR  Low energy -     CMP14+EGFR -     TSH + free T4 -     B12 and Folate Panel -     VITAMIN D  25 Hydroxy (Vit-D Deficiency, Fractures) -     CBC w/Diff/Platelet -     Fe+TIBC+Fer  Medication management -     Lipid panel -     CMP14+EGFR -     TSH + free T4 -     B12 and Folate Panel -     VITAMIN D  25 Hydroxy (Vit-D Deficiency, Fractures) -     CBC w/Diff/Platelet -     Fe+TIBC+Fer  Visit for screening mammogram -     MM 3D SCREENING MAMMOGRAM BILATERAL BREAST  Former smoker -     Ambulatory Referral Lung Cancer Screening [facility]  Immunization due -     Flu vaccine HIGH DOSE PF(Fluzone Trivalent) -     Pfizer Comirnaty Covid -19 Vaccine 50yrs and older  Stress at home -     busPIRone  (BUSPAR ) 30 MG tablet; Take 1 tablet (30 mg total) by mouth in the morning and at bedtime.  Moderate persistent asthma with acute exacerbation -     budesonide -formoterol  (SYMBICORT ) 160-4.5 MCG/ACT inhaler; Inhale 2 puffs into the lungs 2 (two) times daily.  SOB (shortness of breath) -     budesonide -formoterol  (SYMBICORT ) 160-4.5 MCG/ACT inhaler; Inhale 2 puffs into the lungs 2 (two) times daily.  Other orders -     dapagliflozin  propanediol (FARXIGA ) 10 MG TABS tablet; Take 1 tablet (10 mg total) by mouth  daily. -     lisinopril   (ZESTRIL ) 2.5 MG tablet; Take 1 tablet (2.5 mg total) by mouth daily.   Assessment & Plan Essential hypertension Brings in BP log today.  Blood pressure generally well-managed with occasional morning fluctuations due to lisinopril  timing, diet, and stress. - Continue lisinopril  2.5 mg daily. - Discussed occasional high morning readings due to medication timing.  CKD Stage IIIa - continue farxiga  10mg  daily - recheck kidney function today  Hyperlipidemia Management ongoing with increased Lipitor dosage. Awaiting lab results for lipid levels. - Order labs to assess lipid levels. - Continue current Lipitor dosage.  Hypothyroidism Managed with Synthroid . - Check thyroid  function tests with labs.  Asthma Controlled - refilled symbicort  daily - continue duoneb as needed for rescue  Anxiety/Depression/mood disorder Controlled. PHQ-9 was 5 and GAD-7 was 0.  -refilled lexparo,buspar ,lamictal   -clonazepam  only as needed  General Health Maintenance Health maintenance up to date with recent vaccinations. Weight appropriate, reducing fall risk. Lung cancer screening indicated due to past smoking history. - Order mammogram. - Order lung cancer screening. - Check B12 levels with labs.     Jeannemarie Sawaya, PA-C

## 2024-04-02 NOTE — Patient Instructions (Signed)
 Referral for lung cancer screening and mammogram.  Get labs today

## 2024-04-03 LAB — CBC WITH DIFFERENTIAL/PLATELET
Basophils Absolute: 0.1 x10E3/uL (ref 0.0–0.2)
Basos: 1 %
EOS (ABSOLUTE): 0.2 x10E3/uL (ref 0.0–0.4)
Eos: 4 %
Hematocrit: 42.1 % (ref 34.0–46.6)
Hemoglobin: 13 g/dL (ref 11.1–15.9)
Immature Grans (Abs): 0 x10E3/uL (ref 0.0–0.1)
Immature Granulocytes: 0 %
Lymphocytes Absolute: 2.2 x10E3/uL (ref 0.7–3.1)
Lymphs: 44 %
MCH: 27.6 pg (ref 26.6–33.0)
MCHC: 30.9 g/dL — ABNORMAL LOW (ref 31.5–35.7)
MCV: 89 fL (ref 79–97)
Monocytes Absolute: 0.4 x10E3/uL (ref 0.1–0.9)
Monocytes: 7 %
Neutrophils Absolute: 2.1 x10E3/uL (ref 1.4–7.0)
Neutrophils: 44 %
Platelets: 175 x10E3/uL (ref 150–450)
RBC: 4.71 x10E6/uL (ref 3.77–5.28)
RDW: 12.3 % (ref 11.7–15.4)
WBC: 4.9 x10E3/uL (ref 3.4–10.8)

## 2024-04-03 LAB — CMP14+EGFR
ALT: 19 IU/L (ref 0–32)
AST: 26 IU/L (ref 0–40)
Albumin: 4.3 g/dL (ref 3.8–4.8)
Alkaline Phosphatase: 118 IU/L (ref 49–135)
BUN/Creatinine Ratio: 10 — ABNORMAL LOW (ref 12–28)
BUN: 9 mg/dL (ref 8–27)
Bilirubin Total: 0.7 mg/dL (ref 0.0–1.2)
CO2: 23 mmol/L (ref 20–29)
Calcium: 9.6 mg/dL (ref 8.7–10.3)
Chloride: 105 mmol/L (ref 96–106)
Creatinine, Ser: 0.94 mg/dL (ref 0.57–1.00)
Globulin, Total: 2.1 g/dL (ref 1.5–4.5)
Glucose: 82 mg/dL (ref 70–99)
Potassium: 3.9 mmol/L (ref 3.5–5.2)
Sodium: 145 mmol/L — ABNORMAL HIGH (ref 134–144)
Total Protein: 6.4 g/dL (ref 6.0–8.5)
eGFR: 64 mL/min/1.73 (ref >59)

## 2024-04-03 LAB — LIPID PANEL
Chol/HDL Ratio: 2.6 ratio (ref 0.0–4.4)
Cholesterol, Total: 134 mg/dL (ref 100–199)
HDL: 51 mg/dL (ref >39)
LDL Chol Calc (NIH): 62 mg/dL (ref 0–99)
Triglycerides: 115 mg/dL (ref 0–149)
VLDL Cholesterol Cal: 21 mg/dL (ref 5–40)

## 2024-04-03 LAB — TSH+FREE T4
Free T4: 1.22 ng/dL (ref 0.82–1.77)
TSH: 5.27 u[IU]/mL — ABNORMAL HIGH (ref 0.450–4.500)

## 2024-04-03 LAB — B12 AND FOLATE PANEL
Folate: 7.6 ng/mL (ref >3.0)
Vitamin B-12: 227 pg/mL — ABNORMAL LOW (ref 232–1245)

## 2024-04-03 LAB — IRON,TIBC AND FERRITIN PANEL
Ferritin: 41 ng/mL (ref 15–150)
Iron Saturation: 23 % (ref 15–55)
Iron: 72 ug/dL (ref 27–139)
Total Iron Binding Capacity: 308 ug/dL (ref 250–450)
UIBC: 236 ug/dL (ref 118–369)

## 2024-04-03 LAB — VITAMIN D 25 HYDROXY (VIT D DEFICIENCY, FRACTURES): Vit D, 25-Hydroxy: 38.1 ng/mL (ref 30.0–100.0)

## 2024-04-04 ENCOUNTER — Other Ambulatory Visit: Payer: Self-pay | Admitting: Physician Assistant

## 2024-04-05 ENCOUNTER — Encounter: Payer: Self-pay | Admitting: Physician Assistant

## 2024-04-05 ENCOUNTER — Ambulatory Visit: Payer: Self-pay | Admitting: Physician Assistant

## 2024-04-05 MED ORDER — DAPAGLIFLOZIN PROPANEDIOL 10 MG PO TABS: 10.0000 mg | ORAL_TABLET | Freq: Every day | ORAL | 1 refills | Status: AC

## 2024-04-05 MED ORDER — BUDESONIDE-FORMOTEROL FUMARATE 160-4.5 MCG/ACT IN AERO
2.0000 | INHALATION_SPRAY | Freq: Two times a day (BID) | RESPIRATORY_TRACT | 3 refills | Status: AC
Start: 2024-04-05 — End: ?

## 2024-04-05 MED ORDER — CLONAZEPAM 0.5 MG PO TABS: 0.5000 mg | ORAL_TABLET | Freq: Every day | ORAL | 1 refills | Status: AC | PRN

## 2024-04-05 MED ORDER — ESCITALOPRAM OXALATE 20 MG PO TABS: 20.0000 mg | ORAL_TABLET | Freq: Every day | ORAL | 3 refills | Status: DC

## 2024-04-05 MED ORDER — BUSPIRONE HCL 30 MG PO TABS: 30.0000 mg | ORAL_TABLET | Freq: Two times a day (BID) | ORAL | 1 refills | Status: AC

## 2024-04-05 MED ORDER — LISINOPRIL 2.5 MG PO TABS: 2.5000 mg | ORAL_TABLET | Freq: Every day | ORAL | 1 refills | Status: AC

## 2024-04-05 NOTE — Progress Notes (Signed)
 Rand,   Potassium improved and in normal range.  Calcium  looks better.  Free TSH normal.  TSH is trending towards hypothyroidism. Recheck in 2 months.  Hemoglobin looks good.  B12 is low. Thoughts on b12 shots monthly for 2 months and then recheck?  Cholesterol looks great.  Vitamin D  looks much better! What dose are you taking right now?

## 2024-04-05 NOTE — Telephone Encounter (Signed)
 PAP: Application for Marcelline Deist has been submitted to [employer] ([employer]), via fax

## 2024-04-06 NOTE — Telephone Encounter (Signed)
 Nurse visit b12 once a month.

## 2024-04-06 NOTE — Telephone Encounter (Signed)
 Called to get patient scheduled for her initial B12 injection.

## 2024-04-06 NOTE — Progress Notes (Signed)
 Last read by Vina ONEIDA Sieving at 7:48PM on 04/05/2024.

## 2024-04-07 ENCOUNTER — Ambulatory Visit (INDEPENDENT_AMBULATORY_CARE_PROVIDER_SITE_OTHER)

## 2024-04-07 DIAGNOSIS — E538 Deficiency of other specified B group vitamins: Secondary | ICD-10-CM | POA: Diagnosis not present

## 2024-04-07 MED ORDER — CYANOCOBALAMIN 1000 MCG/ML IJ SOLN
1000.0000 ug | INTRAMUSCULAR | Status: AC
  Administered 2024-04-07 – 2024-07-12 (×4): 1000 ug via INTRAMUSCULAR

## 2024-04-07 NOTE — Telephone Encounter (Signed)
 PAP: Patient assistance application for Farxiga  has been approved by PAP Companies: [employer] from 06/24/2024 to 06/23/2025. Medication should be delivered to PAP Delivery: Home. For further shipping updates, please contact [employer] ([employer]) at [phone]. Patient ID is: [id number]

## 2024-04-07 NOTE — Progress Notes (Signed)
 Patient is here for her initial B12 injection. Pt is scheduled for every 30 days. Denies muscle cramps, weakness or irregular heart rate.  Pt given injection in her LD tolerated well. No redness or swelling noted at the site Patient advised to schedule nurse visit for B12 injection in 30 days. (Around 05/08/24)

## 2024-04-07 NOTE — Progress Notes (Signed)
 Angelica [name]                                          MRN: [id number]   04/07/2024   The [facility] Quality Team Specialist reviewed this patient medical record for the purposes of chart review for care gap closure. The following were reviewed: chart review for care gap closure-kidney health evaluation for diabetes:eGFR  and uACR.    [facility] Quality Team

## 2024-04-07 NOTE — Progress Notes (Signed)
 Pharmacy Medication Assistance Program Note    04/07/2024  Patient ID: Angelica [name], female  DOB: 03/29/1951, 73 y.o.  MRN:  [id number]     04/07/2024  Outreach Medication Two  Initial Outreach Date (Medication Two) 03/23/2024  Manufacturer Medication Two [employer]  [employer] [facility] Farxiga   Type of Radiographer, therapeutic Assistance  Date Application Sent to Patient 03/23/2024  Application Items Requested Application  Date Application Sent to Prescriber 03/25/2024  Name of Prescriber Vermell Bologna  Date Application Received From Patient 04/02/2024  Application Items Received From Patient Application  Date Application Received From Provider 03/30/2024  Method Application Sent to Manufacturer Fax  Date Application Submitted to Manufacturer 04/02/2024  Patient Assistance Determination Approved  Patient Notification Method Telephone Call  Telephone Call Outcome Successful  Approval date 06/24/2024 until 06/23/2025

## 2024-04-17 ENCOUNTER — Ambulatory Visit (HOSPITAL_BASED_OUTPATIENT_CLINIC_OR_DEPARTMENT_OTHER)
Admission: RE | Admit: 2024-04-17 | Discharge: 2024-04-17 | Disposition: A | Discharge status: Home / Self Care | Source: Ambulatory Visit | Attending: Physician Assistant | Admitting: Physician Assistant

## 2024-04-17 DIAGNOSIS — Z1231 Encounter for screening mammogram for malignant neoplasm of breast: Secondary | ICD-10-CM | POA: Insufficient documentation

## 2024-04-22 ENCOUNTER — Other Ambulatory Visit (HOSPITAL_COMMUNITY): Payer: Self-pay

## 2024-04-29 ENCOUNTER — Telehealth: Payer: Self-pay | Admitting: Acute Care

## 2024-04-29 DIAGNOSIS — Z87891 Personal history of nicotine dependence: Secondary | ICD-10-CM

## 2024-04-29 DIAGNOSIS — Z122 Encounter for screening for malignant neoplasm of respiratory organs: Secondary | ICD-10-CM

## 2024-04-29 NOTE — Telephone Encounter (Signed)
 Lung Cancer Screening Narrative/Criteria Questionnaire (Cigarette Smokers Only- No Cigars/Pipes/vapes)   Angelica [name]   SDMV:05/05/2024 10:45 [name]     10-22-1950   LDCT: 05/07/2024 2:00p <HP    73 y.o.   Phone: [phone]  Lung Screening Narrative (confirm age 3-77 yrs Medicare / 50-80 yrs Private pay insurance)   Insurance information:[employer] mcr   Referring Provider:[name], [place]   This screening involves an initial phone call with a team member from our program. It is called a shared decision making visit. The initial meeting is required by  insurance and Medicare to make sure you understand the program. This appointment takes about 15-20 minutes to complete. You will complete the screening scan at your scheduled date/time.  This scan takes about 5-10 minutes to complete. You can eat and drink normally before and after the scan.  Criteria questions for Lung Cancer Screening:   Are you a current or former smoker? Former Age began smoking: 73yo   If you are a former smoker, what year did you quit smoking? Quit a few times and started back again, total quit was 3 years. Then final quit time was 2015. (within 15 yrs)   To calculate your smoking history, I need an accurate estimate of how many packs of cigarettes you smoked per day and for how many years. (Not just the number of PPD you are now smoking)   Years smoking 44 x Packs per day 1 = Pack years 44   (at least 20 pack yrs)   (Make sure they understand that we need to know how much they have smoked in the past, not just the number of PPD they are smoking now)  Do you have a personal history of cancer?  Yes - (type and when diagnosed - 5 yrs cancer free) Breast - dx in 2009 received chemo.    Do you have a family history of cancer? No  Are you coughing up blood?  No  Have you had unexplained weight loss of 15 lbs or more in the last 6 months? No  It looks like you meet all criteria.  When would be a good time for us  to  schedule you for this screening?   Additional information: N/A

## 2024-05-05 ENCOUNTER — Encounter: Payer: Self-pay | Admitting: Adult Health

## 2024-05-05 ENCOUNTER — Ambulatory Visit: Admitting: Adult Health

## 2024-05-05 DIAGNOSIS — Z87891 Personal history of nicotine dependence: Secondary | ICD-10-CM | POA: Diagnosis not present

## 2024-05-05 NOTE — Progress Notes (Signed)
  Virtual Visit via Telephone Note  I connected with Angelica [name] , 05/05/24 10:46 AM by a telemedicine application and verified that I am speaking with the correct person using two identifiers.  Location: Patient: home Provider: home   I discussed the limitations of evaluation and management by telemedicine and the availability of in person appointments. The patient expressed understanding and agreed to proceed.   Shared Decision Making Visit Lung Cancer Screening Program [phone])   Eligibility: 73 y.o. Pack Years Smoking History Calculation = 44 pack years  (# packs/per year x # years smoked) Recent History of coughing up blood  no Unexplained weight loss? no ( >Than 15 pounds within the last 6 months ) Prior History Lung / other cancer no - breast cancer 2009  (Diagnosis within the last 5 years already requiring surveillance chest CT Scans). Smoking Status Former Smoker Former Smokers: Years since quit: 10 years  Quit Date: 2015  Visit Components: Discussion included one or more decision making aids. YES Discussion included risk/benefits of screening. YES Discussion included potential follow up diagnostic testing for abnormal scans. YES Discussion included meaning and risk of over diagnosis. YES Discussion included meaning and risk of False Positives. YES Discussion included meaning of total radiation exposure. YES  Counseling Included: Importance of adherence to annual lung cancer LDCT screening. YES Impact of comorbidities on ability to participate in the program. YES Ability and willingness to under diagnostic treatment. YES  Smoking Cessation Counseling: Former Smokers:  Discussed the importance of maintaining cigarette abstinence. yes Diagnosis Code: Personal History of Nicotine Dependence. S12.108 Information about tobacco cessation classes and interventions provided to patient. Yes Patient provided with ticket for LDCT Scan. yes Written Order for Lung  Cancer Screening with LDCT placed in Epic. Yes (CT Chest Lung Cancer Screening Low Dose W/O CM) [id number]   Z12.2-Screening of respiratory organs Z87.891-Personal history of nicotine dependence   Angelica [name] 05/05/24

## 2024-05-05 NOTE — Patient Instructions (Signed)

## 2024-05-07 ENCOUNTER — Ambulatory Visit (HOSPITAL_BASED_OUTPATIENT_CLINIC_OR_DEPARTMENT_OTHER)
Admission: RE | Admit: 2024-05-07 | Discharge: 2024-05-07 | Disposition: A | Discharge status: Home / Self Care | Source: Ambulatory Visit | Attending: Acute Care | Admitting: Acute Care

## 2024-05-07 DIAGNOSIS — Z122 Encounter for screening for malignant neoplasm of respiratory organs: Secondary | ICD-10-CM | POA: Diagnosis present

## 2024-05-07 DIAGNOSIS — Z87891 Personal history of nicotine dependence: Secondary | ICD-10-CM | POA: Diagnosis present

## 2024-05-10 ENCOUNTER — Ambulatory Visit

## 2024-05-10 VITALS — BP 138/70 | HR 69 | Resp 17 | Ht 63.0 in | Wt 130.3 lb

## 2024-05-10 DIAGNOSIS — E538 Deficiency of other specified B group vitamins: Secondary | ICD-10-CM | POA: Diagnosis not present

## 2024-05-10 NOTE — Progress Notes (Signed)
   Subjective:    Patient ID: Angelica [name], female    DOB: Aug 19, 1950, 73 y.o.   MRN: [id number]  HPI  Patient is in the office today for her B12 injection. Denies muscle cramps, heart palpitations, or medication changes. Pt comes in every 30 days for her injection.  Review of Systems     Objective:   Physical Exam        Assessment & Plan:   Pt given B12 injection in her RD. Tolerated well no redness or swelling noted at the site.Pt advised to RTC in 30 days for next injection. ( Around 06/09/24)

## 2024-05-17 ENCOUNTER — Other Ambulatory Visit: Payer: Self-pay

## 2024-05-17 DIAGNOSIS — Z122 Encounter for screening for malignant neoplasm of respiratory organs: Secondary | ICD-10-CM

## 2024-05-17 DIAGNOSIS — Z87891 Personal history of nicotine dependence: Secondary | ICD-10-CM

## 2024-05-31 ENCOUNTER — Other Ambulatory Visit: Payer: Self-pay | Admitting: Physician Assistant

## 2024-05-31 DIAGNOSIS — E890 Postprocedural hypothyroidism: Secondary | ICD-10-CM

## 2024-06-01 ENCOUNTER — Encounter: Payer: Self-pay | Admitting: Physician Assistant

## 2024-06-01 ENCOUNTER — Ambulatory Visit (INDEPENDENT_AMBULATORY_CARE_PROVIDER_SITE_OTHER): Admitting: Physician Assistant

## 2024-06-01 VITALS — BP 130/60 | HR 97 | Ht 63.0 in | Wt 129.0 lb

## 2024-06-01 DIAGNOSIS — E890 Postprocedural hypothyroidism: Secondary | ICD-10-CM

## 2024-06-01 DIAGNOSIS — N1831 Chronic kidney disease, stage 3a: Secondary | ICD-10-CM

## 2024-06-01 DIAGNOSIS — I10 Essential (primary) hypertension: Secondary | ICD-10-CM

## 2024-06-01 DIAGNOSIS — R809 Proteinuria, unspecified: Secondary | ICD-10-CM | POA: Insufficient documentation

## 2024-06-01 DIAGNOSIS — Z13820 Encounter for screening for osteoporosis: Secondary | ICD-10-CM

## 2024-06-01 DIAGNOSIS — E538 Deficiency of other specified B group vitamins: Secondary | ICD-10-CM | POA: Insufficient documentation

## 2024-06-01 NOTE — Progress Notes (Signed)
 Established Patient Office Visit  Subjective   Patient ID: Angelica [name], female    DOB: [birth year]  Age: 73 y.o. MRN: [id number]  Chief Complaint  Patient presents with   Medical Management of Chronic Issues    Lab results    HPI .SABRADiscussed the use of AI scribe software for clinical note transcription with the patient, who gave verbal consent to proceed.  History of Present Illness Angelica [name] is a 73 year old female with hypertension and chronic kidney disease who presents for routine follow-up.  Hypertension - Does not regularly monitor blood pressure at home, but checks occasionally and usually under 130/80 - Currently taking lisinopril  2.5 mg daily - Denies any SOB, CP, palpitations  Chronic kidney disease - Taking Farxiga  and Lisinopril  to help maintain kidney function - Previous urine protein test performed for insurance purposes was positive and she would like to follow  Pulmonary status - Using Symbicort  for pulmonary maintenance - No dyspnea or respiratory complaints  Sleep quality - Taking trazodone  as needed for sleep - Sleeping well  General symptoms - No chest pain, shortness of breath, or abdominal pain  Preventive care - Received influenza vaccination this year    ROS See HPI.    Objective:     BP (!) 154/64   Pulse 97   Ht 5' 3 (1.6 m)   Wt 129 lb (58.5 kg)   LMP 06/24/2000   SpO2 97%   BMI 22.85 kg/m  BP Readings from Last 3 Encounters:  06/01/24 130/60  05/10/24 138/70  04/02/24 (!) 140/58   Wt Readings from Last 3 Encounters:  06/01/24 129 lb (58.5 kg)  05/10/24 130 lb 5 oz (59.1 kg)  04/02/24 125 lb (56.7 kg)      Physical Exam Constitutional:      Appearance: Normal appearance.  HENT:     Head: Normocephalic.  Cardiovascular:     Rate and Rhythm: Normal rate and regular rhythm.     Heart sounds: Murmur heard.  Pulmonary:     Effort: Pulmonary effort is normal.     Breath sounds: Normal breath sounds.   Musculoskeletal:     Right lower leg: No edema.     Left lower leg: No edema.  Neurological:     General: No focal deficit present.     Mental Status: She is alert and oriented to person, place, and time.  Psychiatric:        Mood and Affect: Mood normal.       The 10-year ASCVD risk score (Arnett DK, et al., 2019) is: 22.6%    Assessment & Plan:  SABRASABRAShirah was seen today for medical management of chronic issues.  Diagnoses and all orders for this visit:  Postablative hypothyroidism -     TSH + free T4  Osteoporosis screening -     DG Bone Density; Future  Stage 3a chronic kidney disease (HCC) -     BMP8+eGFR  Microalbuminuria -     BMP8+eGFR  Essential hypertension -     BMP8+eGFR  B12 deficiency -     B12 and Folate Panel    Assessment & Plan  Routine wellness visit with normal weight and pulse rate. Recent colonoscopy completed. Will call for report. Due for bone density scan. - Ordered bone density scan. - Checked thyroid  lab.  Hypertension Blood pressure 154/64 mmHg. REchecked to 130/60. Not regularly monitoring at home. On lisinopril  2.5 mg. - Continue lisinopril  2.5 mg. - Encouraged to monitor  at home.   Chronic kidney disease Proteinuria noted, likely related to chronic kidney disease. Farxiga  prescribed to maintain kidney function. - Rechecked serum creatinine. - Continue Farxiga  and Lisinopril .   Postablative hypothyroidism Thyroid  function to be assessed with lab work. - Checked thyroid  lab.  Chronic obstructive pulmonary disease Lung function well-managed on Symbicort . No respiratory symptoms reported. - Continue Symbicort .  Hyperlipidemia Continues on cholesterol medication. - Continue atorvastatin  medication.     Return in about 6 months (around 11/30/2024).    Ramon Zanders, PA-C

## 2024-06-02 ENCOUNTER — Ambulatory Visit: Payer: Self-pay | Admitting: Physician Assistant

## 2024-06-02 LAB — BMP8+EGFR
BUN/Creatinine Ratio: 9 — ABNORMAL LOW (ref 12–28)
BUN: 9 mg/dL (ref 8–27)
CO2: 25 mmol/L (ref 20–29)
Calcium: 9.2 mg/dL (ref 8.7–10.3)
Chloride: 104 mmol/L (ref 96–106)
Creatinine, Ser: 1.01 mg/dL — ABNORMAL HIGH (ref 0.57–1.00)
Glucose: 81 mg/dL (ref 70–99)
Potassium: 3.8 mmol/L (ref 3.5–5.2)
Sodium: 138 mmol/L (ref 134–144)
eGFR: 59 mL/min/1.73 — ABNORMAL LOW (ref >59)

## 2024-06-02 LAB — B12 AND FOLATE PANEL
Folate: 7.5 ng/mL (ref >3.0)
Vitamin B-12: 399 pg/mL (ref 232–1245)

## 2024-06-02 LAB — TSH+FREE T4
Free T4: 1.28 ng/dL (ref 0.82–1.77)
TSH: 5.13 u[IU]/mL — ABNORMAL HIGH (ref 0.450–4.500)

## 2024-06-02 NOTE — Progress Notes (Signed)
 Angelica [name],   Watch for hypothyroid symptoms but free T4 stable and in a good range. If became symptomatic we could increase thyroid  medication just a little.   B12 improved! I think to keep you in this range we continue with monthly b12 shots. Could anyone at your home be trained to give them?   Kidney function dropped just a very little. Will continue to monitor. Recheck in 3 months.

## 2024-06-08 NOTE — Progress Notes (Unsigned)
° °  Subjective:    Patient ID: Angelica [name], female    DOB: 10-10-1950, 74 y.o.   MRN: [id number]  HPI  Patient is in the office today for her B12 injection. Denies muscle cramps, heart palpitations, or medication changes. Pt comes in every 30 days for her injection.   Review of Systems     Objective:   Physical Exam        Assessment & Plan:   Pt given B12 injection in her LD. Tolerated well no redness or swelling noted at the site.Pt advised to RTC in 30 days for next injection. ( Around 07/10/23)

## 2024-06-09 ENCOUNTER — Ambulatory Visit

## 2024-06-09 VITALS — BP 139/61 | HR 75 | Resp 19 | Ht 63.0 in | Wt 129.0 lb

## 2024-06-09 DIAGNOSIS — E538 Deficiency of other specified B group vitamins: Secondary | ICD-10-CM | POA: Diagnosis not present

## 2024-06-09 MED ORDER — "SYRINGE/NEEDLE (DISP) 18G X 1"" 3 ML MISC": 0 refills | Status: DC

## 2024-06-09 MED ORDER — CYANOCOBALAMIN 1000 MCG/ML IJ SOLN: 1000.0000 ug | INTRAMUSCULAR | 0 refills | Status: AC

## 2024-06-09 NOTE — Addendum Note (Signed)
 Addended by: [name] on: 06/09/2024 03:26 PM   Modules accepted: Orders

## 2024-06-09 NOTE — Addendum Note (Signed)
 Addended byBETHA DUWAINE RIGGS on: 06/09/2024 03:05 PM   Modules accepted: Orders

## 2024-06-28 ENCOUNTER — Other Ambulatory Visit: Payer: Self-pay | Admitting: Physician Assistant

## 2024-06-28 DIAGNOSIS — E890 Postprocedural hypothyroidism: Secondary | ICD-10-CM

## 2024-07-02 ENCOUNTER — Encounter: Payer: Self-pay | Admitting: Physician Assistant

## 2024-07-02 NOTE — Telephone Encounter (Signed)
 Left message for a return call

## 2024-07-05 ENCOUNTER — Encounter: Payer: Self-pay | Admitting: Physician Assistant

## 2024-07-05 DIAGNOSIS — E890 Postprocedural hypothyroidism: Secondary | ICD-10-CM

## 2024-07-05 MED ORDER — "SYRINGE/NEEDLE (DISP) 18G X 1"" 3 ML MISC": 0 refills | Status: AC

## 2024-07-05 NOTE — Telephone Encounter (Signed)
 Spoke with patient.states she had accidentally told the pharmacy to cancel the 18G needle and would need this resent to pharmacy .  I did resend the prescription to pharmacy per protocol. Patient will contact us  if still having difficulty in filling the medication.

## 2024-07-06 ENCOUNTER — Encounter: Payer: Self-pay | Admitting: Physician Assistant

## 2024-07-09 NOTE — Progress Notes (Unsigned)
" ° °  Subjective:    Patient ID: Angelica [name], female    DOB: [birth year], 74 y.o.   MRN: [id number]  HPI  Patient is in the office today for her B12 injection. Denies muscle cramps, heart palpitations, or medication changes. Pt comes in every 30 days for her injection.   Review of Systems     Objective:   Physical Exam        Assessment & Plan:    "

## 2024-07-09 NOTE — Telephone Encounter (Signed)
 Okay, thank you for the clarification that makes more sense.  I think organ to have to just send it to a different pharmacy then if that is all they have in stock especially if she is itching she needs to quit taking it.  If she still has some old 19s she can take those until we can get this fixed.  The other option is if her 50 mcg are scored she can take 1-1/2 every day.  Especially if they are white or some other color that does not have red dye.  And then we can update her prescription to say 1-1/2 daily at [facility] especially if she wants to stick with [facility].  Unfortunately a lot of the 75's are either pink or purple which means they have red dye in them.

## 2024-07-09 NOTE — Telephone Encounter (Signed)
 Open I really need some clarification on this 1.  Is she saying that the purple 1 does have red dye in it?  If that is the case then we can always call the pharmacy and have them change it to 1 that is not purple.  But we would also need to know what dose she is talking about because she just sent the note regarding 2 different strengths.  And if they do change her generic she is going to need a repeat TSH in 6 weeks after the switch.

## 2024-07-12 ENCOUNTER — Ambulatory Visit (INDEPENDENT_AMBULATORY_CARE_PROVIDER_SITE_OTHER)

## 2024-07-12 ENCOUNTER — Ambulatory Visit

## 2024-07-12 DIAGNOSIS — E538 Deficiency of other specified B group vitamins: Secondary | ICD-10-CM

## 2024-07-12 NOTE — Patient Instructions (Signed)
 patient will have daughter assist with home administartion of next B12 injection on 08/11/2024.

## 2024-07-12 NOTE — Progress Notes (Signed)
" ° °  Established Patient Office Visit  Subjective   Patient ID: Angelica [name], female    DOB: 21-Nov-1950  Age: 74 y.o. MRN: [id number]  Chief Complaint  Patient presents with   B12 deficiency    Vit B12 injection - nurse visit.     HPI  Vit B12 deficiency- B12 injection nurse visit. Patient denies weakness, irregular heart rate or GI problems.  Patient in office with Daughter for instruction on home administration of B12 injection.   ROS    Objective:     LMP 06/24/2000    Physical Exam   No results found for any visits on 07/12/24.    The 10-year ASCVD risk score (Arnett DK, et al., 2019) is: 18.8%    Assessment & Plan:  Instructed patient daughter on home administration of B12 injections. Including checking that correct medication was given from pharmacy , that the medication has not expired, preparing supplies, washing hands before administration, how to draw up the medication form the vial and how to remove any bubbles that might form, changing from draw needle to administration needle. How to locate correct site, how to administer and withdraw needle once medication given, how to use safety cap on needle and how to dispose of supplies used at home.patient daughter administered cyanocobalamin  1000mcg IM right deltoid. Patient tolerated injection well without complications. Office supply used for injection given today. Daughter voiced confidence on assisting with injections at home for patient. Patient will plan to administer next injection at home. Patient will contact us  when giving next injection if additional assistance is needed.  Problem List Items Addressed This Visit       Other   B12 deficiency - Primary    Return for patient will have daughter assist with home administartion of next B12 injection on 08/11/2024.Angelica Suzen SHAUNNA Alpheus, LPN  "

## 2024-07-13 ENCOUNTER — Encounter: Payer: Self-pay | Admitting: Physician Assistant

## 2024-07-13 DIAGNOSIS — E039 Hypothyroidism, unspecified: Secondary | ICD-10-CM

## 2024-07-14 ENCOUNTER — Other Ambulatory Visit: Payer: Self-pay | Admitting: Physician Assistant

## 2024-07-14 DIAGNOSIS — F32A Depression, unspecified: Secondary | ICD-10-CM

## 2024-07-14 MED ORDER — LEVOTHYROXINE SODIUM 50 MCG PO TABS: ORAL_TABLET | ORAL | 0 refills | Status: DC

## 2024-07-14 NOTE — Telephone Encounter (Signed)
 Spoke with pharmacist  who states that they use the Amneal brand of Levothyroxine  and that both the 50mcg and 75mcg contain corn.   I asked if they stock the Tirosint  (Capsules):  as Google search shows that they Contains levothyroxine , gelatin, glycerin, and water, with no corn, gluten, or lactose.Only 25mg  available for this at his pharmacy.   States the Mylan brand dose not contain corn and he can get this as either the 50mcg or 75mcg . Did you want to resend the prescription as the and fill as Mylan brand  or leave as the 50mcg  1 1/2 as mylan brand?

## 2024-07-16 MED ORDER — LEVOTHYROXINE SODIUM 75 MCG PO TABS: 75.0000 ug | ORAL_TABLET | Freq: Every day | ORAL | 1 refills | Status: AC

## 2024-07-16 NOTE — Telephone Encounter (Signed)
 Prescription for levothyroxine84mcg has been sent to patient preferred pharmacy with note that must be filled as Mylan mfg.  Lab orders placed in patient chart for TSH and free T4 for patient recheck in 6 weeks   Patient informed as above.

## 2024-07-28 ENCOUNTER — Other Ambulatory Visit

## 2024-08-10 ENCOUNTER — Ambulatory Visit

## 2024-08-11 ENCOUNTER — Other Ambulatory Visit

## 2024-10-01 ENCOUNTER — Ambulatory Visit: Admitting: Physician Assistant

## 2024-11-30 ENCOUNTER — Ambulatory Visit: Admitting: Physician Assistant
